# Patient Record
Sex: Female | Born: 1945 | Race: White | Hispanic: No | Smoking: Never smoker
Health system: Southern US, Academic
[De-identification: ages and names within clinical notes are randomized; demographics above are authoritative.]

## PROBLEM LIST (undated history)

## (undated) ENCOUNTER — Encounter (HOSPITAL_COMMUNITY)
Admission: EM | Disposition: A | Discharge status: Other Acute Inpatient Hospital | Payer: Self-pay | Source: Home / Self Care | Attending: SURGICAL CRITICAL CARE

## (undated) DIAGNOSIS — S129XXA Fracture of neck, unspecified, initial encounter: Secondary | ICD-10-CM

## (undated) DIAGNOSIS — I82409 Acute embolism and thrombosis of unspecified deep veins of unspecified lower extremity: Secondary | ICD-10-CM

## (undated) DIAGNOSIS — Z950 Presence of cardiac pacemaker: Secondary | ICD-10-CM

## (undated) DIAGNOSIS — M24541 Contracture, right hand: Secondary | ICD-10-CM

## (undated) DIAGNOSIS — G35 Multiple sclerosis: Secondary | ICD-10-CM

## (undated) DIAGNOSIS — O2230 Deep phlebothrombosis in pregnancy, unspecified trimester: Secondary | ICD-10-CM

## (undated) DIAGNOSIS — Z8711 Personal history of peptic ulcer disease: Secondary | ICD-10-CM

## (undated) DIAGNOSIS — Z8719 Personal history of other diseases of the digestive system: Secondary | ICD-10-CM

## (undated) DIAGNOSIS — J302 Other seasonal allergic rhinitis: Secondary | ICD-10-CM

## (undated) DIAGNOSIS — G8929 Other chronic pain: Secondary | ICD-10-CM

## (undated) DIAGNOSIS — F5101 Primary insomnia: Secondary | ICD-10-CM

## (undated) DIAGNOSIS — E039 Hypothyroidism, unspecified: Secondary | ICD-10-CM

## (undated) HISTORY — PX: SHOULDER SURGERY: SHX246

## (undated) HISTORY — DX: Other seasonal allergic rhinitis: J30.2

## (undated) HISTORY — PX: SMALL INTESTINE SURGERY: SHX150

## (undated) HISTORY — PX: ELBOW SURGERY: SHX618

## (undated) HISTORY — PX: NECK SURGERY: SHX720

## (undated) HISTORY — DX: Other chronic pain: G89.29

## (undated) HISTORY — PX: CATARACT EXTRACTION, BILATERAL: SHX1313

## (undated) HISTORY — PX: OTHER SURGICAL HISTORY: SHX169

## (undated) HISTORY — PX: TRACHEAL SURGERY: SHX1096

## (undated) HISTORY — DX: Hypothyroidism, unspecified: E03.9

## (undated) HISTORY — PX: BREAST SURGERY: SHX581

## (undated) HISTORY — PX: CHOLECYSTECTOMY: SHX55

## (undated) HISTORY — PX: ABDOMINAL SURGERY: SHX537

## (undated) HISTORY — DX: Primary insomnia: F51.01

## (undated) HISTORY — PX: TONSILLECTOMY AND ADENOIDECTOMY: SUR1326

## (undated) HISTORY — PX: ROUX-EN-Y GASTRIC BYPASS: SHX1104

## (undated) HISTORY — PX: APPENDECTOMY: SHX54

## (undated) HISTORY — PX: BACK SURGERY: SHX140

## (undated) HISTORY — DX: Multiple sclerosis: G35

## (undated) HISTORY — PX: ABDOMINAL HYSTERECTOMY: SHX81

## (undated) HISTORY — PX: GASTRIC BYPASS: SHX52

## (undated) HISTORY — PX: WRIST SURGERY: SHX841

## (undated) HISTORY — DX: Personal history of peptic ulcer disease: Z87.11

## (undated) HISTORY — DX: Contracture, right hand: M24.541

## (undated) HISTORY — DX: Fracture of neck, unspecified, initial encounter (CMS HCC): S12.9XXA

## (undated) HISTORY — PX: HX GALL BLADDER SURGERY/CHOLE: SHX55

## (undated) HISTORY — PX: SPINE SURGERY: SHX786

## (undated) HISTORY — PX: HX APPENDECTOMY: SHX54

## (undated) HISTORY — PX: HX GASTRIC BYPASS: SHX52

## (undated) SURGERY — LAMINECTOMY SPINE CERVICAL POSTERIOR PRONE 3 LEVELS OR MORE
Anesthesia: General | Site: Spine Cervical

---
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
## 2014-01-11 DIAGNOSIS — G35 Multiple sclerosis: Secondary | ICD-10-CM | POA: Diagnosis present

## 2014-01-11 DIAGNOSIS — K449 Diaphragmatic hernia without obstruction or gangrene: Secondary | ICD-10-CM | POA: Insufficient documentation

## 2014-01-11 DIAGNOSIS — I429 Cardiomyopathy, unspecified: Secondary | ICD-10-CM | POA: Insufficient documentation

## 2014-01-11 DIAGNOSIS — M1990 Unspecified osteoarthritis, unspecified site: Secondary | ICD-10-CM | POA: Insufficient documentation

## 2015-01-19 DIAGNOSIS — Z950 Presence of cardiac pacemaker: Secondary | ICD-10-CM

## 2015-01-19 HISTORY — DX: Presence of cardiac pacemaker: Z95.0

## 2017-11-22 HISTORY — PX: CERVICAL SPINE SURGERY: SHX589

## 2017-12-01 ENCOUNTER — Emergency Department (EMERGENCY_DEPARTMENT_HOSPITAL): Payer: Medicare HMO

## 2017-12-01 ENCOUNTER — Emergency Department (HOSPITAL_COMMUNITY): Payer: Medicare HMO

## 2017-12-01 ENCOUNTER — Inpatient Hospital Stay
Admission: EM | Admit: 2017-12-01 | Discharge: 2017-12-17 | DRG: 003 | Disposition: A | Discharge status: Other Acute Inpatient Hospital | Payer: Medicare HMO | Attending: SURGICAL CRITICAL CARE | Admitting: SURGICAL CRITICAL CARE

## 2017-12-01 ENCOUNTER — Inpatient Hospital Stay (HOSPITAL_COMMUNITY): Payer: 59 | Admitting: SURGICAL CRITICAL CARE

## 2017-12-01 ENCOUNTER — Encounter (HOSPITAL_COMMUNITY): Payer: Self-pay | Admitting: Student in an Organized Health Care Education/Training Program

## 2017-12-01 DIAGNOSIS — S14105A Unspecified injury at C5 level of cervical spinal cord, initial encounter: Secondary | ICD-10-CM | POA: Diagnosis present

## 2017-12-01 DIAGNOSIS — R448 Other symptoms and signs involving general sensations and perceptions: Secondary | ICD-10-CM

## 2017-12-01 DIAGNOSIS — S12400A Unspecified displaced fracture of fifth cervical vertebra, initial encounter for closed fracture: Secondary | ICD-10-CM | POA: Diagnosis present

## 2017-12-01 DIAGNOSIS — G8250 Quadriplegia, unspecified: Secondary | ICD-10-CM | POA: Diagnosis present

## 2017-12-01 DIAGNOSIS — J90 Pleural effusion, not elsewhere classified: Secondary | ICD-10-CM | POA: Diagnosis present

## 2017-12-01 DIAGNOSIS — Z86718 Personal history of other venous thrombosis and embolism: Secondary | ICD-10-CM

## 2017-12-01 DIAGNOSIS — S1980XA Other specified injuries of unspecified part of neck, initial encounter: Secondary | ICD-10-CM

## 2017-12-01 DIAGNOSIS — E119 Type 2 diabetes mellitus without complications: Secondary | ICD-10-CM | POA: Diagnosis present

## 2017-12-01 DIAGNOSIS — R64 Cachexia: Secondary | ICD-10-CM | POA: Diagnosis present

## 2017-12-01 DIAGNOSIS — F329 Major depressive disorder, single episode, unspecified: Secondary | ICD-10-CM | POA: Diagnosis present

## 2017-12-01 DIAGNOSIS — S0990XA Unspecified injury of head, initial encounter: Secondary | ICD-10-CM

## 2017-12-01 DIAGNOSIS — Z9049 Acquired absence of other specified parts of digestive tract: Secondary | ICD-10-CM

## 2017-12-01 DIAGNOSIS — I491 Atrial premature depolarization: Secondary | ICD-10-CM

## 2017-12-01 DIAGNOSIS — J189 Pneumonia, unspecified organism: Secondary | ICD-10-CM | POA: Diagnosis present

## 2017-12-01 DIAGNOSIS — Z4682 Encounter for fitting and adjustment of non-vascular catheter: Secondary | ICD-10-CM

## 2017-12-01 DIAGNOSIS — S12200A Unspecified displaced fracture of third cervical vertebra, initial encounter for closed fracture: Secondary | ICD-10-CM | POA: Diagnosis present

## 2017-12-01 DIAGNOSIS — S29001A Unspecified injury of muscle and tendon of front wall of thorax, initial encounter: Secondary | ICD-10-CM

## 2017-12-01 DIAGNOSIS — S3983XA Other specified injuries of pelvis, initial encounter: Secondary | ICD-10-CM

## 2017-12-01 DIAGNOSIS — Z45018 Encounter for adjustment and management of other part of cardiac pacemaker: Secondary | ICD-10-CM

## 2017-12-01 DIAGNOSIS — S14109A Unspecified injury at unspecified level of cervical spinal cord, initial encounter: Secondary | ICD-10-CM | POA: Diagnosis present

## 2017-12-01 DIAGNOSIS — Z950 Presence of cardiac pacemaker: Secondary | ICD-10-CM | POA: Diagnosis present

## 2017-12-01 DIAGNOSIS — G8253 Quadriplegia, C5-C7 complete: Secondary | ICD-10-CM | POA: Diagnosis present

## 2017-12-01 DIAGNOSIS — S32049A Unspecified fracture of fourth lumbar vertebra, initial encounter for closed fracture: Secondary | ICD-10-CM | POA: Diagnosis present

## 2017-12-01 DIAGNOSIS — S12130A Unspecified traumatic displaced spondylolisthesis of second cervical vertebra, initial encounter for closed fracture: Secondary | ICD-10-CM

## 2017-12-01 DIAGNOSIS — B961 Klebsiella pneumoniae [K. pneumoniae] as the cause of diseases classified elsewhere: Secondary | ICD-10-CM | POA: Diagnosis present

## 2017-12-01 DIAGNOSIS — N390 Urinary tract infection, site not specified: Secondary | ICD-10-CM | POA: Diagnosis present

## 2017-12-01 DIAGNOSIS — E46 Unspecified protein-calorie malnutrition: Secondary | ICD-10-CM | POA: Diagnosis present

## 2017-12-01 DIAGNOSIS — I2510 Atherosclerotic heart disease of native coronary artery without angina pectoris: Secondary | ICD-10-CM | POA: Diagnosis present

## 2017-12-01 DIAGNOSIS — T148XXA Other injury of unspecified body region, initial encounter: Secondary | ICD-10-CM

## 2017-12-01 DIAGNOSIS — K592 Neurogenic bowel, not elsewhere classified: Secondary | ICD-10-CM | POA: Diagnosis present

## 2017-12-01 DIAGNOSIS — S225XXA Flail chest, initial encounter for closed fracture: Secondary | ICD-10-CM | POA: Diagnosis present

## 2017-12-01 DIAGNOSIS — G35 Multiple sclerosis: Secondary | ICD-10-CM | POA: Diagnosis present

## 2017-12-01 DIAGNOSIS — S2243XA Multiple fractures of ribs, bilateral, initial encounter for closed fracture: Secondary | ICD-10-CM | POA: Diagnosis present

## 2017-12-01 DIAGNOSIS — Z9884 Bariatric surgery status: Secondary | ICD-10-CM | POA: Diagnosis present

## 2017-12-01 DIAGNOSIS — S22039A Unspecified fracture of third thoracic vertebra, initial encounter for closed fracture: Secondary | ICD-10-CM | POA: Diagnosis present

## 2017-12-01 DIAGNOSIS — J9690 Respiratory failure, unspecified, unspecified whether with hypoxia or hypercapnia: Secondary | ICD-10-CM

## 2017-12-01 DIAGNOSIS — Z9911 Dependence on respirator [ventilator] status: Secondary | ICD-10-CM

## 2017-12-01 DIAGNOSIS — S12230A Unspecified traumatic displaced spondylolisthesis of third cervical vertebra, initial encounter for closed fracture: Secondary | ICD-10-CM

## 2017-12-01 DIAGNOSIS — S22029A Unspecified fracture of second thoracic vertebra, initial encounter for closed fracture: Secondary | ICD-10-CM | POA: Diagnosis present

## 2017-12-01 DIAGNOSIS — R0602 Shortness of breath: Secondary | ICD-10-CM

## 2017-12-01 DIAGNOSIS — T794XXA Traumatic shock, initial encounter: Secondary | ICD-10-CM | POA: Diagnosis present

## 2017-12-01 DIAGNOSIS — S13161A Dislocation of C5/C6 cervical vertebrae, initial encounter: Secondary | ICD-10-CM | POA: Diagnosis present

## 2017-12-01 DIAGNOSIS — I4891 Unspecified atrial fibrillation: Secondary | ICD-10-CM | POA: Diagnosis present

## 2017-12-01 DIAGNOSIS — Y95 Nosocomial condition: Secondary | ICD-10-CM | POA: Diagnosis not present

## 2017-12-01 DIAGNOSIS — S27329A Contusion of lung, unspecified, initial encounter: Secondary | ICD-10-CM | POA: Diagnosis present

## 2017-12-01 DIAGNOSIS — I4589 Other specified conduction disorders: Secondary | ICD-10-CM

## 2017-12-01 DIAGNOSIS — M50222 Other cervical disc displacement at C5-C6 level: Secondary | ICD-10-CM | POA: Diagnosis present

## 2017-12-01 DIAGNOSIS — R9431 Abnormal electrocardiogram [ECG] [EKG]: Secondary | ICD-10-CM

## 2017-12-01 DIAGNOSIS — J9600 Acute respiratory failure, unspecified whether with hypoxia or hypercapnia: Secondary | ICD-10-CM | POA: Diagnosis present

## 2017-12-01 DIAGNOSIS — F438 Other reactions to severe stress: Secondary | ICD-10-CM | POA: Diagnosis present

## 2017-12-01 DIAGNOSIS — N319 Neuromuscular dysfunction of bladder, unspecified: Secondary | ICD-10-CM | POA: Diagnosis present

## 2017-12-01 DIAGNOSIS — S12100A Unspecified displaced fracture of second cervical vertebra, initial encounter for closed fracture: Secondary | ICD-10-CM | POA: Diagnosis present

## 2017-12-01 DIAGNOSIS — S129XXA Fracture of neck, unspecified, initial encounter: Secondary | ICD-10-CM

## 2017-12-01 DIAGNOSIS — S12500A Unspecified displaced fracture of sixth cervical vertebra, initial encounter for closed fracture: Secondary | ICD-10-CM | POA: Diagnosis present

## 2017-12-01 DIAGNOSIS — S39001A Unspecified injury of muscle, fascia and tendon of abdomen, initial encounter: Secondary | ICD-10-CM

## 2017-12-01 DIAGNOSIS — S22008A Other fracture of unspecified thoracic vertebra, initial encounter for closed fracture: Secondary | ICD-10-CM | POA: Diagnosis present

## 2017-12-01 DIAGNOSIS — S14104A Unspecified injury at C4 level of cervical spinal cord, initial encounter: Principal | ICD-10-CM | POA: Diagnosis present

## 2017-12-01 DIAGNOSIS — M549 Dorsalgia, unspecified: Secondary | ICD-10-CM

## 2017-12-01 DIAGNOSIS — S25101A Unspecified injury of right innominate or subclavian artery, initial encounter: Secondary | ICD-10-CM | POA: Diagnosis present

## 2017-12-01 DIAGNOSIS — I442 Atrioventricular block, complete: Secondary | ICD-10-CM | POA: Diagnosis present

## 2017-12-01 DIAGNOSIS — Z981 Arthrodesis status: Secondary | ICD-10-CM

## 2017-12-01 DIAGNOSIS — S14109S Unspecified injury at unspecified level of cervical spinal cord, sequela: Secondary | ICD-10-CM

## 2017-12-01 HISTORY — DX: Other cervical disc displacement at C5-C6 level: M50.222

## 2017-12-01 HISTORY — DX: Unspecified injury at unspecified level of cervical spinal cord, sequela: S14.109S

## 2017-12-01 HISTORY — DX: Quadriplegia, unspecified: G82.50

## 2017-12-01 HISTORY — DX: Multiple fractures of ribs, bilateral, initial encounter for closed fracture: S22.43XA

## 2017-12-01 HISTORY — DX: Acute embolism and thrombosis of unspecified deep veins of unspecified lower extremity (CMS HCC): I82.409

## 2017-12-01 HISTORY — DX: Deep phlebothrombosis in pregnancy, unspecified trimester: O22.30

## 2017-12-01 HISTORY — DX: Multiple sclerosis (CMS HCC): G35

## 2017-12-01 HISTORY — DX: Presence of cardiac pacemaker: Z95.0

## 2017-12-01 LAB — URINALYSIS, MACRO/MICRO
BILIRUBIN: NEGATIVE mg/dL
COLOR: NORMAL
GLUCOSE: NEGATIVE mg/dL
LEUKOCYTES: NEGATIVE WBCs/uL
NITRITE: NEGATIVE
PH: 7 (ref 5.0–8.0)
PROTEIN: NEGATIVE mg/dL
SPECIFIC GRAVITY: 1.005 (ref 1.005–1.030)

## 2017-12-01 LAB — DRUG SCREEN, NO CONFIRMATION, URINE
BARBITURATES URINE: NEGATIVE
BENZODIAZEPINES URINE: NEGATIVE
BUPRENORPHINE URINE: NEGATIVE
CANNABINOIDS URINE: NEGATIVE
COCAINE METABOLITES URINE: NEGATIVE
CREATININE RANDOM URINE: 15 mg/dL
ECSTASY/MDMA URINE: NEGATIVE
METHADONE URINE: NEGATIVE
OPIATES URINE (LOW CUTOFF): NEGATIVE
OXIDANT-ADULTERATION: NEGATIVE
PH-ADULTERATION: 7.3 (ref 4.5–9.0)
SPECIFIC GRAVITY-ADULTERATION: 1.013 g/mL (ref 1.005–1.030)

## 2017-12-01 LAB — CBC WITH DIFF
BASOPHIL #: <0.1 10*3/uL (ref <=0.20)
BASOPHIL %: 0 %
EOSINOPHIL #: 0.1 10*3/uL (ref <=0.50)
EOSINOPHIL %: 1 %
HCT: 38.2 % (ref 34.8–46.0)
HGB: 12.4 g/dL (ref 11.5–16.0)
IMMATURE GRANULOCYTE #: 0.13 10*3/uL — ABNORMAL HIGH (ref <0.10)
IMMATURE GRANULOCYTE %: 2 % — ABNORMAL HIGH (ref 0–1)
LYMPHOCYTE #: 1.36 10*3/uL (ref 1.00–4.80)
MCH: 28.8 pg (ref 26.0–32.0)
MCHC: 32.5 g/dL (ref 31.0–35.5)
MCV: 88.8 fL (ref 78.0–100.0)
MONOCYTE #: 0.43 10*3/uL (ref 0.20–1.10)
MONOCYTE %: 6 %
MPV: 10.4 fL (ref 8.7–12.5)
NEUTROPHIL #: 4.99 10*3/uL (ref 1.50–7.70)
NEUTROPHIL %: 72 %
PLATELETS: 152 10*3/uL (ref 150–400)
RBC: 4.3 10*6/uL (ref 3.85–5.22)
RDW-CV: 13.9 % (ref 11.5–15.5)
WBC: 7 10*3/uL (ref 3.7–11.0)

## 2017-12-01 LAB — ARTERIAL BLOOD GAS/LACTATE/CO-OX/LYTES (NA/K/CA/CL/GLUC) - ORS ONLY
BASE EXCESS (ARTERIAL): 2.7 mmol/L — ABNORMAL HIGH (ref 0.0–1.0)
BICARBONATE (ARTERIAL): 26.5 mmol/L — ABNORMAL HIGH (ref 18.0–26.0)
CARBOXYHEMOGLOBIN: 0.8 % (ref 0.0–2.5)
CHLORIDE: 104 mmol/L (ref 101–111)
GLUCOSE: 110 mg/dL — ABNORMAL HIGH (ref 60–105)
HEMOGLOBIN: 12.5 g/dL (ref 12.0–18.0)
IONIZED CALCIUM: 1.21 mmol/L (ref 1.10–1.35)
LACTATE: 1.1 mmol/L (ref 0.0–1.3)
MET-HEMOGLOBIN: 0 % (ref 0.0–2.0)
O2CT: 13 % — ABNORMAL LOW (ref 15.7–24.3)
OXYHEMOGLOBIN: 73.9 % — CL (ref 85.0–98.0)
PCO2 (ARTERIAL): 50 mmHg — ABNORMAL HIGH (ref 35.0–45.0)
PH (ARTERIAL): 7.37 (ref 7.35–7.45)
PO2 (ARTERIAL): 42 mmHg — CL (ref 72.0–100.0)
SODIUM: 136 mmol/L — ABNORMAL LOW (ref 137–145)
WHOLE BLOOD POTASSIUM: 4 mmol/L (ref 3.5–4.6)

## 2017-12-01 LAB — BASIC METABOLIC PANEL
ANION GAP: 7 mmol/L (ref 4–13)
BUN/CREA RATIO: 12 (ref 6–22)
BUN: 9 mg/dL (ref 8–25)
CALCIUM: 8.4 mg/dL — ABNORMAL LOW (ref 8.5–10.2)
CHLORIDE: 107 mmol/L (ref 96–111)
CO2 TOTAL: 25 mmol/L (ref 22–32)
CREATININE: 0.75 mg/dL (ref 0.49–1.10)
ESTIMATED GFR: >59 mL/min/1.73mˆ2 (ref >59)
GLUCOSE: 103 mg/dL (ref 65–139)
POTASSIUM: 4 mmol/L (ref 3.5–5.1)
SODIUM: 139 mmol/L (ref 136–145)

## 2017-12-01 LAB — PT/INR
INR: 1 (ref 0.80–1.20)
PROTHROMBIN TIME: 11.8 s (ref 9.5–14.1)

## 2017-12-01 LAB — BUN: BUN: 9 mg/dL (ref 8–25)

## 2017-12-01 LAB — CREATININE WITH EGFR
CREATININE: 0.74 mg/dL (ref 0.49–1.10)
ESTIMATED GFR: >59 mL/min/1.73mˆ2 (ref >59)

## 2017-12-01 LAB — ETHANOL, SERUM: ETHANOL: NOT DETECTED

## 2017-12-01 LAB — ARTERIAL BLOOD GAS/LACTATE/CO-OX/LYTES (NA/K/CA/CL/GLUC)
%FIO2 (ARTERIAL): 40 %
O2CT: 13 % — ABNORMAL LOW (ref 15.7–24.3)

## 2017-12-01 LAB — ETHANOL, SERUM/PLASMA: ETHANOL: <10 mg/dL (ref <10)

## 2017-12-01 LAB — POC ISTAT CREATININE (RESULT): CREATININE, POC: 0.8 mg/dL (ref 0.49–1.10)

## 2017-12-01 LAB — PTT (PARTIAL THROMBOPLASTIN TIME): APTT: 33.1 s (ref 24.1–38.5)

## 2017-12-01 MED ORDER — SODIUM CHLORIDE 0.9 % (FLUSH) INJECTION SYRINGE
2.00 mL | INJECTION | INTRAMUSCULAR | Status: DC | PRN
Start: 2017-12-01 — End: 2017-12-17

## 2017-12-01 MED ORDER — BISACODYL 5 MG TABLET,DELAYED RELEASE
5.0000 mg | DELAYED_RELEASE_TABLET | Freq: Two times a day (BID) | ORAL | Status: DC
Start: 2017-12-01 — End: 2017-12-01
  Administered 2017-12-01: 0 mg via ORAL
  Filled 2017-12-01 (×3): qty 1

## 2017-12-01 MED ORDER — KETAMINE 50 MG/ML INJECTION SOLUTION
Freq: Once | INTRAMUSCULAR | Status: AC | PRN
Start: 2017-12-01 — End: 2017-12-01
  Administered 2017-12-01: 75 mg via INTRAVENOUS

## 2017-12-01 MED ORDER — PSYLLIUM ORAL PACKET WRAPPER
1.0000 | PACK | Freq: Every day | Status: DC
Start: 2017-12-01 — End: 2017-12-02
  Administered 2017-12-01: 0 via ORAL
  Filled 2017-12-01 (×2): qty 1

## 2017-12-01 MED ORDER — CHLORHEXIDINE GLUCONATE 0.12 % MOUTHWASH
15.0000 mL | MOUTHWASH | Freq: Two times a day (BID) | Status: DC
Start: 2017-12-01 — End: 2017-12-17
  Administered 2017-12-01 – 2017-12-17 (×32): 15 mL via ORAL
  Filled 2017-12-01 (×33): qty 15

## 2017-12-01 MED ORDER — SODIUM CHLORIDE 0.9 % INTRAVENOUS SOLUTION
INTRAVENOUS | Status: DC
Start: 2017-12-01 — End: 2017-12-01

## 2017-12-01 MED ORDER — DOCUSATE SODIUM 50 MG/5 ML ORAL LIQUID
100.00 mg | Freq: Two times a day (BID) | ORAL | Status: DC
Start: 2017-12-01 — End: 2017-12-17
  Administered 2017-12-01: 0 mg via GASTROSTOMY
  Administered 2017-12-02: 100 mg via GASTROSTOMY
  Administered 2017-12-02: 0 mg via GASTROSTOMY
  Administered 2017-12-03 – 2017-12-09 (×12): 100 mg via GASTROSTOMY
  Administered 2017-12-09 – 2017-12-10 (×3): 0 mg via GASTROSTOMY
  Administered 2017-12-11 – 2017-12-17 (×13): 100 mg via GASTROSTOMY
  Filled 2017-12-01 (×37): qty 10

## 2017-12-01 MED ORDER — ALBUTEROL SULFATE 2.5 MG/3 ML (0.083 %) SOLUTION FOR NEBULIZATION
2.5000 mg | INHALATION_SOLUTION | RESPIRATORY_TRACT | Status: DC | PRN
Start: 2017-12-01 — End: 2017-12-07

## 2017-12-01 MED ORDER — MAGNESIUM HYDROXIDE 400 MG/5 ML ORAL SUSPENSION
15.0000 mL | Freq: Four times a day (QID) | ORAL | Status: DC | PRN
Start: 2017-12-01 — End: 2017-12-02

## 2017-12-01 MED ORDER — ELECTROLYTE-A INTRAVENOUS SOLUTION
INTRAVENOUS | Status: DC
Start: 2017-12-01 — End: 2017-12-02

## 2017-12-01 MED ORDER — FENTANYL (PF) 50 MCG/ML INTRAVENOUS SOLUTION
1.50 ug/kg/h | INTRAVENOUS | Status: DC
Start: 2017-12-01 — End: 2017-12-03
  Administered 2017-12-01: 0 ug/kg/h via INTRAVENOUS
  Administered 2017-12-01: 1.5 ug/kg/h via INTRAVENOUS
  Administered 2017-12-01: 0.2 ug/kg/h via INTRAVENOUS
  Administered 2017-12-01: 1.5 ug/kg/h via INTRAVENOUS
  Administered 2017-12-01: 0.6 ug/kg/h via INTRAVENOUS
  Administered 2017-12-02: 0 ug/kg/h via INTRAVENOUS
  Administered 2017-12-02: 0.4 ug/kg/h via INTRAVENOUS
  Administered 2017-12-02: 0.6 ug/kg/h via INTRAVENOUS
  Administered 2017-12-02 (×2): 0.8 ug/kg/h via INTRAVENOUS
  Administered 2017-12-02: 0.2 ug/kg/h via INTRAVENOUS
  Administered 2017-12-03: 0 ug/kg/h via INTRAVENOUS
  Filled 2017-12-01: qty 50

## 2017-12-01 MED ORDER — ELECTROLYTE-A INTRAVENOUS SOLUTION
INTRAVENOUS | Status: DC
Start: 2017-12-01 — End: 2017-12-01

## 2017-12-01 MED ORDER — IOVERSOL 320 MG IODINE/ML INTRAVENOUS SOLUTION
100.00 mL | INTRAVENOUS | Status: AC
Start: 2017-12-01 — End: 2017-12-01
  Administered 2017-12-01: 100 mL via INTRAVENOUS

## 2017-12-01 MED ORDER — NALOXONE 0.4 MG/ML INJECTION SOLUTION
INTRAMUSCULAR | Status: AC
Start: 2017-12-01 — End: 2017-12-01
  Administered 2017-12-01: 0.4 mg via INTRAVENOUS
  Filled 2017-12-01: qty 1

## 2017-12-01 MED ORDER — CLINDAMYCIN 900 MG/50 ML IN 5 % DEXTROSE INTRAVENOUS PIGGYBACK
900.0000 mg | INJECTION | Freq: Once | INTRAVENOUS | Status: DC
Start: 2017-12-01 — End: 2017-12-02

## 2017-12-01 MED ORDER — SODIUM CHLORIDE 0.9 % (FLUSH) INJECTION SYRINGE
2.00 mL | INJECTION | Freq: Three times a day (TID) | INTRAMUSCULAR | Status: DC
Start: 2017-12-01 — End: 2017-12-17
  Administered 2017-12-01: 0 mL
  Administered 2017-12-01: 2 mL
  Administered 2017-12-02 (×2): 0 mL
  Administered 2017-12-03: 2 mL
  Administered 2017-12-03: 0 mL
  Administered 2017-12-03: 2 mL
  Administered 2017-12-03: 0 mL
  Administered 2017-12-04: 2 mL
  Administered 2017-12-04: 0 mL
  Administered 2017-12-04 – 2017-12-07 (×8): 2 mL
  Administered 2017-12-07 (×2): 0 mL
  Administered 2017-12-08: 2 mL
  Administered 2017-12-08: 0 mL
  Administered 2017-12-08: 2 mL
  Administered 2017-12-09: 0 mL
  Administered 2017-12-09 – 2017-12-13 (×12): 2 mL
  Administered 2017-12-13 – 2017-12-14 (×4): 0 mL
  Administered 2017-12-14 – 2017-12-15 (×2): 2 mL
  Administered 2017-12-15: 0 mL
  Administered 2017-12-16 – 2017-12-17 (×4): 2 mL

## 2017-12-01 MED ORDER — ALBUTEROL SULFATE 2.5 MG/3 ML (0.083 %) SOLUTION FOR NEBULIZATION
2.5000 mg | INHALATION_SOLUTION | RESPIRATORY_TRACT | Status: DC
Start: 2017-12-01 — End: 2017-12-06
  Administered 2017-12-02 (×3): 2.5 mg via RESPIRATORY_TRACT
  Administered 2017-12-02: 0 mg via RESPIRATORY_TRACT
  Administered 2017-12-02 – 2017-12-03 (×4): 2.5 mg via RESPIRATORY_TRACT
  Administered 2017-12-03: 0 mg via RESPIRATORY_TRACT
  Administered 2017-12-03 – 2017-12-06 (×18): 2.5 mg via RESPIRATORY_TRACT
  Filled 2017-12-01 (×13): qty 3
  Filled 2017-12-01: qty 6
  Filled 2017-12-01 (×11): qty 3

## 2017-12-01 MED ORDER — NOREPINEPHRINE BITARTRATE 1 MG/ML INTRAVENOUS SOLUTION
INTRAVENOUS | Status: AC
Start: 2017-12-01 — End: 2017-12-02
  Filled 2017-12-01: qty 16

## 2017-12-01 MED ORDER — BISACODYL 10 MG RECTAL SUPPOSITORY
10.0000 mg | Freq: Every day | RECTAL | Status: DC
Start: 2017-12-01 — End: 2017-12-17
  Administered 2017-12-01: 0 mg via RECTAL
  Administered 2017-12-02 – 2017-12-09 (×8): 10 mg via RECTAL
  Administered 2017-12-10: 0 mg via RECTAL
  Administered 2017-12-11 – 2017-12-17 (×7): 10 mg via RECTAL
  Filled 2017-12-01 (×18): qty 1

## 2017-12-01 MED ORDER — GABAPENTIN 300 MG CAPSULE
300.0000 mg | ORAL_CAPSULE | Freq: Two times a day (BID) | ORAL | Status: DC
Start: 2017-12-02 — End: 2017-12-04
  Administered 2017-12-01: 23:00:00 0 mg via GASTROSTOMY
  Administered 2017-12-02: 300 mg via GASTROSTOMY
  Administered 2017-12-02: 0 mg via GASTROSTOMY
  Administered 2017-12-03 – 2017-12-04 (×3): 300 mg via GASTROSTOMY
  Filled 2017-12-01 (×5): qty 1

## 2017-12-01 MED ORDER — SENNA LEAF EXTRACT 176 MG/5 ML ORAL SYRUP
10.0000 mL | ORAL_SOLUTION | Freq: Two times a day (BID) | ORAL | Status: DC
Start: 2017-12-01 — End: 2017-12-17
  Administered 2017-12-01: 0 mg via GASTROSTOMY
  Administered 2017-12-02: 352 mg via GASTROSTOMY
  Administered 2017-12-02: 0 mg via GASTROSTOMY
  Administered 2017-12-03 – 2017-12-08 (×11): 352 mg via GASTROSTOMY
  Administered 2017-12-09 – 2017-12-10 (×4): 0 mg via GASTROSTOMY
  Administered 2017-12-11 – 2017-12-17 (×12): 352 mg via GASTROSTOMY
  Filled 2017-12-01 (×37): qty 15

## 2017-12-01 MED ORDER — ELECTROLYTE-A INTRAVENOUS SOLUTION BOLUS
1000.0000 mL | Freq: Once | INTRAVENOUS | Status: AC
Start: 2017-12-01 — End: 2017-12-01
  Administered 2017-12-01: 0 mL via INTRAVENOUS
  Administered 2017-12-01: 1000 mL via INTRAVENOUS

## 2017-12-01 MED ORDER — SODIUM CHLORIDE 0.9 % IV BOLUS
INJECTION | Status: AC | PRN
Start: 2017-12-01 — End: 2017-12-01

## 2017-12-01 MED ORDER — NALOXONE 0.4 MG/ML INJECTION SOLUTION
0.4000 mg | INTRAMUSCULAR | Status: AC
Start: 2017-12-01 — End: 2017-12-01

## 2017-12-01 MED ORDER — FENTANYL (PF) 50 MCG/ML INJECTION SOLUTION
50.00 ug | INTRAMUSCULAR | Status: DC | PRN
Start: 2017-12-01 — End: 2017-12-03
  Administered 2017-12-01 – 2017-12-02 (×7): 50 ug via INTRAVENOUS
  Filled 2017-12-01 (×8): qty 2

## 2017-12-01 MED ORDER — PHENOL 1.4 % MUCOSAL AEROSOL SPRAY
2.00 | INHALATION_SPRAY | Status: DC | PRN
Start: 2017-12-01 — End: 2017-12-03
  Administered 2017-12-02: 02:00:00 2 via OROMUCOSAL
  Filled 2017-12-01: qty 177

## 2017-12-01 MED ORDER — ROCURONIUM 10 MG/ML INTRAVENOUS SOLUTION
Freq: Once | INTRAVENOUS | Status: AC | PRN
Start: 2017-12-01 — End: 2017-12-01
  Administered 2017-12-01: 75 mg via INTRAVENOUS

## 2017-12-01 MED ORDER — FAMOTIDINE 20 MG TABLET
20.0000 mg | ORAL_TABLET | Freq: Two times a day (BID) | ORAL | Status: DC
Start: 2017-12-01 — End: 2017-12-02
  Administered 2017-12-01 – 2017-12-02 (×2): 0 mg via ORAL

## 2017-12-01 MED ORDER — NOREPINEPHRINE BITARTRATE 16 MG/250 ML (64 MCG/ML) IN 0.9 % NACL IV
0.0300 ug/kg/min | INTRAVENOUS | Status: DC
Start: 2017-12-01 — End: 2017-12-11
  Administered 2017-12-01: 0.08 ug/kg/min via INTRAVENOUS
  Administered 2017-12-01: 0.07 ug/kg/min via INTRAVENOUS
  Administered 2017-12-01: 0.08 ug/kg/min via INTRAVENOUS
  Administered 2017-12-01: 0.03 ug/kg/min via INTRAVENOUS
  Administered 2017-12-01 (×2): 0.1 ug/kg/min via INTRAVENOUS
  Administered 2017-12-01: 0.05 ug/kg/min via INTRAVENOUS
  Administered 2017-12-01: 0.07 ug/kg/min via INTRAVENOUS
  Administered 2017-12-01: 0.09 ug/kg/min via INTRAVENOUS
  Administered 2017-12-02: 0.11 ug/kg/min via INTRAVENOUS
  Administered 2017-12-02: 0.05 ug/kg/min via INTRAVENOUS
  Administered 2017-12-02: 0.1 ug/kg/min via INTRAVENOUS
  Administered 2017-12-02: 0.2 ug/kg/min via INTRAVENOUS
  Administered 2017-12-02: 0.18 ug/kg/min via INTRAVENOUS
  Administered 2017-12-02: 0.09 ug/kg/min via INTRAVENOUS
  Administered 2017-12-02: 0.16 ug/kg/min via INTRAVENOUS
  Administered 2017-12-02: 0.12 ug/kg/min via INTRAVENOUS
  Administered 2017-12-02: 0.09 ug/kg/min via INTRAVENOUS
  Administered 2017-12-02: 0.14 ug/kg/min via INTRAVENOUS
  Administered 2017-12-03 (×2): 0.13 ug/kg/min via INTRAVENOUS
  Administered 2017-12-03: 0.09 ug/kg/min via INTRAVENOUS
  Administered 2017-12-03: 0.13 ug/kg/min via INTRAVENOUS
  Administered 2017-12-03: 0.07 ug/kg/min via INTRAVENOUS
  Administered 2017-12-03 (×3): 0.11 ug/kg/min via INTRAVENOUS
  Administered 2017-12-03: 0.07 ug/kg/min via INTRAVENOUS
  Administered 2017-12-03 (×3): 0.09 ug/kg/min via INTRAVENOUS
  Administered 2017-12-03 – 2017-12-04 (×2): 0.07 ug/kg/min via INTRAVENOUS
  Administered 2017-12-04: 0.11 ug/kg/min via INTRAVENOUS
  Administered 2017-12-04: 0.09 ug/kg/min via INTRAVENOUS
  Administered 2017-12-04: 0.07 ug/kg/min via INTRAVENOUS
  Administered 2017-12-04: 0.1 ug/kg/min via INTRAVENOUS
  Administered 2017-12-04: 0.09 ug/kg/min via INTRAVENOUS
  Administered 2017-12-04: 0.07 ug/kg/min via INTRAVENOUS
  Administered 2017-12-04: 0.09 ug/kg/min via INTRAVENOUS
  Administered 2017-12-04 (×2): 0.05 ug/kg/min via INTRAVENOUS
  Administered 2017-12-04: 0.11 ug/kg/min via INTRAVENOUS
  Administered 2017-12-04: 0.09 ug/kg/min via INTRAVENOUS
  Administered 2017-12-04: 0.1 ug/kg/min via INTRAVENOUS
  Administered 2017-12-05: 10:00:00 0.05 ug/kg/min via INTRAVENOUS
  Administered 2017-12-05: 0.01 ug/kg/min via INTRAVENOUS
  Administered 2017-12-05: 0.08 ug/kg/min via INTRAVENOUS
  Administered 2017-12-05: 0.14 ug/kg/min via INTRAVENOUS
  Administered 2017-12-05: .1 ug/kg/min via INTRAVENOUS
  Administered 2017-12-05: .08 ug/kg/min via INTRAVENOUS
  Administered 2017-12-05: 0.16 ug/kg/min via INTRAVENOUS
  Administered 2017-12-05: 0.08 ug/kg/min via INTRAVENOUS
  Administered 2017-12-05: .08 ug/kg/min via INTRAVENOUS
  Administered 2017-12-05: 0.12 ug/kg/min via INTRAVENOUS
  Administered 2017-12-05: 0.16 ug/kg/min via INTRAVENOUS
  Administered 2017-12-05: 0.14 ug/kg/min via INTRAVENOUS
  Administered 2017-12-05: 0.1 ug/kg/min via INTRAVENOUS
  Administered 2017-12-06: 0.19 ug/kg/min via INTRAVENOUS
  Administered 2017-12-06: 0.1 ug/kg/min via INTRAVENOUS
  Administered 2017-12-06: 0.14 ug/kg/min via INTRAVENOUS
  Administered 2017-12-06: 0.12 ug/kg/min via INTRAVENOUS
  Administered 2017-12-06: 0.17 ug/kg/min via INTRAVENOUS
  Administered 2017-12-06: 0.18 ug/kg/min via INTRAVENOUS
  Administered 2017-12-06 (×2): 0.16 ug/kg/min via INTRAVENOUS
  Administered 2017-12-07: 0.07 ug/kg/min via INTRAVENOUS
  Administered 2017-12-07: 0.08 ug/kg/min via INTRAVENOUS
  Administered 2017-12-07: 0.1 ug/kg/min via INTRAVENOUS
  Administered 2017-12-07: 0.07 ug/kg/min via INTRAVENOUS
  Administered 2017-12-07 (×2): 0.09 ug/kg/min via INTRAVENOUS
  Administered 2017-12-07 (×2): 0.1 ug/kg/min via INTRAVENOUS
  Administered 2017-12-08: 0.07 ug/kg/min via INTRAVENOUS
  Administered 2017-12-08: 0.04 ug/kg/min via INTRAVENOUS
  Administered 2017-12-08: 0.06 ug/kg/min via INTRAVENOUS
  Administered 2017-12-08: 0.05 ug/kg/min via INTRAVENOUS
  Administered 2017-12-08: 0.07 ug/kg/min via INTRAVENOUS
  Administered 2017-12-08: 0.03 ug/kg/min via INTRAVENOUS
  Administered 2017-12-08: 0.04 ug/kg/min via INTRAVENOUS
  Administered 2017-12-08: 0.05 ug/kg/min via INTRAVENOUS
  Administered 2017-12-08: 0.04 ug/kg/min via INTRAVENOUS
  Administered 2017-12-09: 0.01 ug/kg/min via INTRAVENOUS
  Administered 2017-12-09: 0.03 ug/kg/min via INTRAVENOUS
  Administered 2017-12-09 (×2): 0.02 ug/kg/min via INTRAVENOUS
  Administered 2017-12-09: 0.01 ug/kg/min via INTRAVENOUS
  Administered 2017-12-09 (×2): 0.02 ug/kg/min via INTRAVENOUS
  Administered 2017-12-09: 0.03 ug/kg/min via INTRAVENOUS
  Administered 2017-12-09: 0.04 ug/kg/min via INTRAVENOUS
  Administered 2017-12-09: 0.02 ug/kg/min via INTRAVENOUS
  Administered 2017-12-09: 0.01 ug/kg/min via INTRAVENOUS
  Administered 2017-12-09: 0 ug/kg/min via INTRAVENOUS
  Administered 2017-12-09: 0.01 ug/kg/min via INTRAVENOUS
  Filled 2017-12-01 (×6): qty 250

## 2017-12-01 MED ADMIN — naloxone 0.4 mg/mL injection solution: INTRAVENOUS | @ 17:00:00
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
## 2017-12-01 NOTE — Nurses Notes (Signed)
Pt taken to SICU 11 with belongings. Security notified and checked belongings with SICU RN.
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
## 2017-12-01 NOTE — Procedures (Signed)
[facility] Line Placement    Procedure Date:  12/01/2017 Time:  18:17   Procedure: Central Line Placement  Indication: venous access    Description:  Because of an urgent/emergent situation, informed consent was not obtained.  A surgical time out was performed to confirm correct patient and procedure.  The site was identified and prepped in the usual sterile fashion.  The skin was prepped with chlorhexidine. Anesthesia was not necessary due to an insensate patient.  A triple  lumen central line was inserted in the right femoral vein with patient in trendelenburg position using the Seldinger Technique.  The vein was cannulated and the guidewire was placed.  The needle was removed and the dilator advanced.  The catheter was placed over the guidewire and the guidewire was then removed.  Ultrasound guidance was required.  All ports drew blood back and flushed easily.  The line was sutured in place and dressed in sterile fashion. CXR ordered to verify placement.  Patient tolerated procedure without complications.     Procedure was performed by Dr. Eliott Nine.    Dr. Olam Idler was present, supervised and participated in the procedure.      Eliott Nine, MD  Surgical Intensive Care Unit  [phone]       I was present and supervised/observed the entire procedure.  Olam Idler, MD

## 2017-12-01 NOTE — Care Plan (Signed)
Pt intubated and on mechanical ventilation. Ventilator setting are    VENTILATOR - SIMV(PRVC)PS / APV-SIMV CONTINUOUS Discontinue   Duration: Until Specified    Priority: Routine       Question Answer Comment   FIO2 (%) 50    Peep(cm/H2O) 8    Rate(bpm) 12    Tidal Volume(mls) 450    Pressure Support(cm/H2O) 10            Cough assist ordered Q4.  Will continue to monitor respiratory status.
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
## 2017-12-01 NOTE — Procedures (Signed)
Device Interrogation  [employer] [phone] dual chamber pacemaker for CHB, sinus arrest. No abandoned hardware. Non-conditional.  Pacer dependent.   Battery status etr=8.9 years.  Mode=DDD@50  bpm.  A: 390 ohms, P=3.8 mV, threshold=0.5 V@0 .4 ms  RV: 760 ohms, R=6.0 mV, threshold=1.0 V @0 .5 ms      Ap=30% , Vp=>99%.   AT/AF burden= <1%.  No changes made today.     Milus Mallick, PA-C      PACEMAKER OR DEFIBRILLATOR EVALUATION (INTERROGATION EVALUATION OR PROGRAMMING EVALUATION), OR ANALYSIS OF REMOTE MONITORING, AS APPLICABLE    Order:    See patient's medical record.    Procedure Description:    Standard programming evaluation (or interrogation evaluation) using device programmer, or standard analysis of remote monitoring, as appropriate.    Data recording:    See printouts, where available, for details.    Physician Analysis and Reporting:    Unremarkable device analysis. No major abnormalities, except as otherwise noted, if applicable.      Plan:    Plan routine follow-up, as appropriate, or unless otherwise indicated.  Please see notes and/or scanned printouts (where available) for details.      Jacquel Redditt B. Noreene Filbert, M.D.  Pager: [phone]  Office: [phone]
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
## 2017-12-01 NOTE — H&P (Signed)
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 clinically significant, or life-threatening deterioration and required a high degree of Attending Physician attention and direct involvement to intervene urgently. Data review and care planning was performed in direct proximity of the patient, examination was obviously performed in direct contact with the patient. All of this time was exclusive of procedure which will be documented elsewhere in the chart.    My critical care time is independent and unique to other providers (no other providers saw patient for purposes of critical care evaluation)  My critical care time involved full attention to the patients' condition and included:    Review of nursing notes and/or old charts  Review of medications, allergies, and vital signs  Documentation time  Consultant collaboration on findings and treatment options  Care, transfer of care, and discharge plans  Ordering, interpreting, and reviewing diagnostic studies/tab tests  Obtaining necessary history from family, EMS, nursing home staff and/or treating physicians    My critical care time did not include time spent teaching resident physician(s) or other services of resident physicians, or performing other reported procedures.  Total Critical Care Time: 50 minutes    Antoine Primas, MD  Assistant Professor of Surgery  Trauma, Surgical  Critical Care, and [place]  12/02/2017 15:29
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
## 2017-12-01 NOTE — Procedures (Signed)
Arterial Line Placement    Procedure Date:  12/01/2017 Time:  1730  Procedure: Arterial Line Placement  Diagnosis:  Spinal cord injury  Indication: invasive BP monitoring    Description: Because of an urgent/emergent situation, informed consent was not obtained. The skin was prepped with chlorhexidine.  A 4 french catheter was inserted in the right Radial using the Seldinger Technigue. Good arterial blood was noted and then the line was connected to the transducer showing arterial wave forms.  The line was sutured in place and dressed. Patient tolerated procedure without complications.     Christy Ferrari, DO 12/01/2017, 19:07       I was present and supervised/observed the entire procedure.  Olam Idler, MD
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
## 2017-12-02 ENCOUNTER — Inpatient Hospital Stay (HOSPITAL_COMMUNITY): Payer: Medicare HMO | Admitting: Student in an Organized Health Care Education/Training Program

## 2017-12-02 ENCOUNTER — Inpatient Hospital Stay (HOSPITAL_COMMUNITY)
Admission: RE | Admit: 2017-12-02 | Discharge: 2017-12-02 | Disposition: A | Discharge status: Home / Self Care | Payer: Medicare HMO | Source: Ambulatory Visit | Admitting: Radiology

## 2017-12-02 ENCOUNTER — Encounter

## 2017-12-02 ENCOUNTER — Inpatient Hospital Stay (HOSPITAL_COMMUNITY): Payer: Medicare HMO

## 2017-12-02 ENCOUNTER — Inpatient Hospital Stay (HOSPITAL_COMMUNITY): Payer: Medicare HMO | Admitting: Radiology

## 2017-12-02 ENCOUNTER — Encounter (HOSPITAL_COMMUNITY)
Admission: EM | Disposition: A | Discharge status: Other Acute Inpatient Hospital | Payer: Self-pay | Source: Home / Self Care | Attending: SURGICAL CRITICAL CARE

## 2017-12-02 DIAGNOSIS — S12100A Unspecified displaced fracture of second cervical vertebra, initial encounter for closed fracture: Secondary | ICD-10-CM

## 2017-12-02 DIAGNOSIS — I2510 Atherosclerotic heart disease of native coronary artery without angina pectoris: Secondary | ICD-10-CM

## 2017-12-02 DIAGNOSIS — Z4682 Encounter for fitting and adjustment of non-vascular catheter: Secondary | ICD-10-CM

## 2017-12-02 DIAGNOSIS — S12200A Unspecified displaced fracture of third cervical vertebra, initial encounter for closed fracture: Secondary | ICD-10-CM

## 2017-12-02 DIAGNOSIS — I440 Atrioventricular block, first degree: Secondary | ICD-10-CM

## 2017-12-02 DIAGNOSIS — S12500A Unspecified displaced fracture of sixth cervical vertebra, initial encounter for closed fracture: Secondary | ICD-10-CM

## 2017-12-02 DIAGNOSIS — S12400A Unspecified displaced fracture of fifth cervical vertebra, initial encounter for closed fracture: Secondary | ICD-10-CM

## 2017-12-02 DIAGNOSIS — T148XXD Other injury of unspecified body region, subsequent encounter: Secondary | ICD-10-CM

## 2017-12-02 DIAGNOSIS — S2243XA Multiple fractures of ribs, bilateral, initial encounter for closed fracture: Secondary | ICD-10-CM

## 2017-12-02 LAB — ARTERIAL BLOOD GAS/LACTATE/CO-OX/LYTES (NA/K/CA/CL/GLUC) (TEMP COMP) - ORS ONLY
(T) PCO2: 32 mmHg — ABNORMAL LOW (ref 35.0–45.0)
(T) PCO2: 32 mmHg — ABNORMAL LOW (ref 35.0–45.0)
(T) PO2: 230 mmHg — ABNORMAL HIGH (ref 72.0–100.0)
(T) PO2: 197 mmHg — ABNORMAL HIGH (ref 72.0–100.0)
BASE DEFICIT: 4.5 mmol/L — ABNORMAL HIGH (ref 0.0–3.0)
BASE DEFICIT: 6.2 mmol/L — ABNORMAL HIGH (ref 0.0–3.0)
BICARBONATE (ARTERIAL): 21.4 mmol/L (ref 18.0–26.0)
BICARBONATE (ARTERIAL): 20.1 mmol/L (ref 18.0–26.0)
CARBOXYHEMOGLOBIN: 1.1 % (ref 0.0–2.5)
CARBOXYHEMOGLOBIN: 1.3 % (ref 0.0–2.5)
CHLORIDE: 111 mmol/L (ref 101–111)
CHLORIDE: 114 mmol/L — ABNORMAL HIGH (ref 101–111)
GLUCOSE: 173 mg/dL — ABNORMAL HIGH (ref 60–105)
GLUCOSE: 143 mg/dL — ABNORMAL HIGH (ref 60–105)
HEMOGLOBIN: 8.1 g/dL — ABNORMAL LOW (ref 12.0–18.0)
HEMOGLOBIN: 7.1 g/dL — ABNORMAL LOW (ref 12.0–18.0)
IONIZED CALCIUM: 0.99 mmol/L — ABNORMAL LOW (ref 1.10–1.35)
IONIZED CALCIUM: 0.94 mmol/L — ABNORMAL LOW (ref 1.10–1.35)
LACTATE: 2.3 mmol/L — ABNORMAL HIGH (ref 0.0–1.3)
LACTATE: 2.1 mmol/L — ABNORMAL HIGH (ref 0.0–1.3)
MET-HEMOGLOBIN: 1.1 % (ref 0.0–2.0)
MET-HEMOGLOBIN: 0.9 % (ref 0.0–2.0)
O2CT: 11.7 % — ABNORMAL LOW (ref 15.7–24.3)
O2CT: 10.3 % — ABNORMAL LOW (ref 15.7–24.3)
OXYHEMOGLOBIN: 97.1 % (ref 85.0–98.0)
OXYHEMOGLOBIN: 97.7 % (ref 85.0–98.0)
PCO2 (ARTERIAL): 31 mmHg — ABNORMAL LOW (ref 35.0–45.0)
PCO2 (ARTERIAL): 31 mmHg — ABNORMAL LOW (ref 35.0–45.0)
PH (ARTERIAL): 7.4 (ref 7.35–7.45)
PH (ARTERIAL): 7.37 (ref 7.35–7.45)
PH (T): 7.39 (ref 7.35–7.45)
PH (T): 7.36 (ref 7.35–7.45)
PO2 (ARTERIAL): 227 mmHg — ABNORMAL HIGH (ref 72.0–100.0)
PO2 (ARTERIAL): 194 mmHg — ABNORMAL HIGH (ref 72.0–100.0)
SODIUM: 138 mmol/L (ref 137–145)
SODIUM: 140 mmol/L (ref 137–145)
TEMPERATURE, COMP: 37.6 C (ref 15.0–40.0)
TEMPERATURE, COMP: 37.6 C (ref 15.0–40.0)
WHOLE BLOOD POTASSIUM: 2.8 mmol/L — ABNORMAL LOW (ref 3.5–4.6)
WHOLE BLOOD POTASSIUM: 2.7 mmol/L — ABNORMAL LOW (ref 3.5–4.6)

## 2017-12-02 LAB — CBC
HCT: 28.9 % — ABNORMAL LOW (ref 34.8–46.0)
HCT: 36.8 % (ref 34.8–46.0)
HGB: 11.7 g/dL (ref 11.5–16.0)
HGB: 9.6 g/dL — ABNORMAL LOW (ref 11.5–16.0)
MCH: 28.7 pg (ref 26.0–32.0)
MCH: 28.8 pg (ref 26.0–32.0)
MCHC: 31.8 g/dL (ref 31.0–35.5)
MCHC: 33.2 g/dL (ref 31.0–35.5)
MCV: 86.8 fL (ref 78.0–100.0)
MCV: 90.4 fL (ref 78.0–100.0)
MPV: 10.7 fL (ref 8.7–12.5)
PLATELETS: 200 x10ˆ3/uL (ref 150–400)
PLATELETS: 98 x10ˆ3/uL — ABNORMAL LOW (ref 150–400)
RBC: 3.33 x10ˆ6/uL — ABNORMAL LOW (ref 3.85–5.22)
RBC: 4.07 x10ˆ6/uL (ref 3.85–5.22)
RDW-CV: 13.8 % (ref 11.5–15.5)
RDW-CV: 15 % (ref 11.5–15.5)
WBC: 18 x10ˆ3/uL — ABNORMAL HIGH (ref 3.7–11.0)
WBC: 8.8 x10ˆ3/uL (ref 3.7–11.0)

## 2017-12-02 LAB — BASIC METABOLIC PANEL
ANION GAP: 12 mmol/L (ref 4–13)
ANION GAP: 8 mmol/L (ref 4–13)
BUN/CREA RATIO: 13 (ref 6–22)
BUN/CREA RATIO: 13 (ref 6–22)
BUN: 10 mg/dL (ref 8–25)
BUN: 10 mg/dL (ref 8–25)
CALCIUM: 8.6 mg/dL (ref 8.5–10.2)
CALCIUM: 8.3 mg/dL — ABNORMAL LOW (ref 8.5–10.2)
CHLORIDE: 106 mmol/L (ref 96–111)
CHLORIDE: 110 mmol/L (ref 96–111)
CO2 TOTAL: 21 mmol/L — ABNORMAL LOW (ref 22–32)
CO2 TOTAL: 23 mmol/L (ref 22–32)
CREATININE: 0.76 mg/dL (ref 0.49–1.10)
CREATININE: 0.75 mg/dL (ref 0.49–1.10)
ESTIMATED GFR: >59 mL/min/1.73mˆ2 (ref >59)
ESTIMATED GFR: >59 mL/min/1.73mˆ2 (ref >59)
GLUCOSE: 193 mg/dL — ABNORMAL HIGH (ref 65–139)
GLUCOSE: 166 mg/dL — ABNORMAL HIGH (ref 65–139)
POTASSIUM: 3.9 mmol/L (ref 3.5–5.1)
POTASSIUM: 3.7 mmol/L (ref 3.5–5.1)
SODIUM: 139 mmol/L (ref 136–145)
SODIUM: 141 mmol/L (ref 136–145)

## 2017-12-02 LAB — ECG 12-LEAD
Atrial Rate: 67 {beats}/min
Calculated P Axis: -11 degrees
Calculated R Axis: 77 degrees
Calculated T Axis: 40 degrees
PR Interval: 214 ms
QRS Duration: 150 ms
QT Interval: 496 ms
QTC Calculation: 524 ms
Ventricular rate: 67 {beats}/min

## 2017-12-02 LAB — POC BLOOD GLUCOSE (RESULTS)
GLUCOSE, POC: 179 mg/dL — ABNORMAL HIGH (ref 70–105)
GLUCOSE, POC: 161 mg/dL — ABNORMAL HIGH (ref 70–105)
GLUCOSE, POC: 171 mg/dL — ABNORMAL HIGH (ref 70–105)

## 2017-12-02 LAB — TEG (THROMBOELASTOGRAPH) - MUST BE IN SEPARATE TUBE

## 2017-12-02 LAB — MAGNESIUM
MAGNESIUM: 2.2 mg/dL (ref 1.6–2.6)
MAGNESIUM: 2.3 mg/dL (ref 1.6–2.6)

## 2017-12-02 LAB — PHOSPHORUS
PHOSPHORUS: 4 mg/dL (ref 2.3–4.0)
PHOSPHORUS: 4.1 mg/dL — ABNORMAL HIGH (ref 2.3–4.0)

## 2017-12-02 LAB — LACTIC ACID LEVEL: LACTIC ACID: 2.8 mmol/L — ABNORMAL HIGH (ref 0.5–2.2)

## 2017-12-02 LAB — ARTERIAL BLOOD GAS/LACTATE/CO-OX/LYTES (NA/K/CA/CL/GLUC) (TEMP COMP)
%FIO2 (ARTERIAL): 60 %
%FIO2 (ARTERIAL): 48 %
PAO2/FIO2 RATIO: 378 (ref <=200)

## 2017-12-02 LAB — TROPONIN-I: TROPONIN I: <7 ng/L (ref 0–30)

## 2017-12-02 SURGERY — LAMINECTOMY SPINE CERVICAL POSTERIOR PRONE 3 LEVELS OR MORE
Anesthesia: General | Site: Spine Cervical | Wound class: Clean Wound: Uninfected operative wounds in which no inflammation occurred

## 2017-12-02 MED ORDER — NOREPINEPHRINE 16MG IN NS 250ML INFUSION - FOR ANES
INTRAVENOUS | Status: DC | PRN
Start: 2017-12-02 — End: 2017-12-02
  Administered 2017-12-02 (×2): .01 ug/kg/min via INTRAVENOUS
  Administered 2017-12-02: .05 ug/kg/min via INTRAVENOUS
  Administered 2017-12-02: .03 ug/kg/min via INTRAVENOUS
  Administered 2017-12-02: .02 ug/kg/min via INTRAVENOUS
  Administered 2017-12-02: .05 ug/kg/min via INTRAVENOUS
  Administered 2017-12-02: .02 ug/kg/min via INTRAVENOUS
  Administered 2017-12-02: .05 ug/kg/min via INTRAVENOUS
  Administered 2017-12-02: .04 ug/kg/min via INTRAVENOUS
  Administered 2017-12-02: .2 ug/kg/min via INTRAVENOUS
  Administered 2017-12-02: .03 ug/kg/min via INTRAVENOUS
  Administered 2017-12-02: .1 ug/kg/min via INTRAVENOUS
  Administered 2017-12-02: .02 ug/kg/min via INTRAVENOUS
  Administered 2017-12-02: .03 ug/kg/min via INTRAVENOUS

## 2017-12-02 MED ORDER — THROMBIN (RECOMBINANT) 5,000 UNIT TOPICAL SOLUTION
Freq: Once | INTRAMUSCULAR | Status: DC | PRN
Start: 2017-12-02 — End: 2017-12-02

## 2017-12-02 MED ORDER — INSULIN REGULAR HUMAN 100 UNIT/ML INJECTION SSIP
2.00 [IU] | INJECTION | Freq: Four times a day (QID) | SUBCUTANEOUS | Status: DC | PRN
Start: 2017-12-02 — End: 2017-12-06
  Administered 2017-12-02 – 2017-12-05 (×5): 2 [IU] via SUBCUTANEOUS
  Administered 2017-12-06: 6 [IU] via SUBCUTANEOUS
  Administered 2017-12-06: 4 [IU] via SUBCUTANEOUS
  Filled 2017-12-02: qty 3

## 2017-12-02 MED ORDER — PERFLUTREN LIPID MICROSPHERES 1.1 MG/ML INTRAVENOUS SUSPENSION
2.00 mL | INTRAVENOUS | Status: DC
Start: 2017-12-02 — End: 2017-12-10

## 2017-12-02 MED ORDER — BUPIVACAINE-EPINEPHRINE (PF) 0.25 %-1:200,000 INJECTION SOLUTION
30.0000 mL | Freq: Once | INTRAMUSCULAR | Status: DC | PRN
Start: 2017-12-02 — End: 2017-12-02
  Administered 2017-12-02: 30 mL via INTRAMUSCULAR
  Administered 2017-12-02: 25 mL via INTRAMUSCULAR

## 2017-12-02 MED ORDER — PHENYLEPHRINE 10 MG IN NS 100 ML INFUSION - FOR ANES
INTRAVENOUS | Status: DC | PRN
Start: 2017-12-02 — End: 2017-12-02
  Administered 2017-12-02: .7 ug/kg/min via INTRAVENOUS
  Administered 2017-12-02: .6 ug/kg/min via INTRAVENOUS
  Administered 2017-12-02: 0 ug/kg/min via INTRAVENOUS
  Administered 2017-12-02: .5 ug/kg/min via INTRAVENOUS
  Administered 2017-12-02: .2 ug/kg/min via INTRAVENOUS
  Administered 2017-12-02: .5 ug/kg/min via INTRAVENOUS
  Administered 2017-12-02: 0.6 ug/kg/min via INTRAVENOUS
  Administered 2017-12-02 (×2): .4 ug/kg/min via INTRAVENOUS
  Administered 2017-12-02: .6 ug/kg/min via INTRAVENOUS
  Administered 2017-12-02: .7 ug/kg/min via INTRAVENOUS
  Administered 2017-12-02: .5 ug/kg/min via INTRAVENOUS

## 2017-12-02 MED ORDER — FENTANYL (PF) 50 MCG/ML INJECTION SOLUTION
Freq: Once | INTRAMUSCULAR | Status: DC | PRN
Start: 2017-12-02 — End: 2017-12-02
  Administered 2017-12-02 (×2): 50 ug via INTRAVENOUS

## 2017-12-02 MED ORDER — SODIUM CHLORIDE 0.9 % INTRAVENOUS SOLUTION
INTRAVENOUS | Status: AC
Start: 2017-12-02 — End: 2017-12-02
  Filled 2017-12-02: qty 1000

## 2017-12-02 MED ORDER — VANCOMYCIN IV - PHARMACIST TO DOSE PER PROTOCOL
Freq: Every day | Status: DC | PRN
Start: 2017-12-02 — End: 2017-12-02

## 2017-12-02 MED ORDER — ALBUMIN, HUMAN 5 % INTRAVENOUS SOLUTION
INTRAVENOUS | Status: DC | PRN
Start: 2017-12-02 — End: 2017-12-02

## 2017-12-02 MED ORDER — THROMBIN (RECOMBINANT) 5,000 UNIT TOPICAL SOLUTION
CUTANEOUS | Status: AC
Start: 2017-12-02 — End: 2017-12-02
  Filled 2017-12-02: qty 2

## 2017-12-02 MED ORDER — BACITRACIN 500 UNIT/G OINTMENT TUBE
TOPICAL_OINTMENT | CUTANEOUS | Status: AC
Start: 2017-12-02 — End: 2017-12-02
  Filled 2017-12-02: qty 15

## 2017-12-02 MED ORDER — CALCIUM CHLORIDE 100 MG/ML (10 %) INTRAVENOUS SYRINGE
INJECTION | Freq: Once | INTRAVENOUS | Status: DC | PRN
Start: 2017-12-02 — End: 2017-12-02
  Administered 2017-12-02: 300 mg via INTRAVENOUS
  Administered 2017-12-02: 200 mg via INTRAVENOUS
  Administered 2017-12-02: 300 mg via INTRAVENOUS
  Administered 2017-12-02: 200 mg via INTRAVENOUS

## 2017-12-02 MED ORDER — BUPIVACAINE-EPINEPHRINE (PF) 0.25 %-1:200,000 INJECTION SOLUTION
INTRAMUSCULAR | Status: AC
Start: 2017-12-02 — End: 2017-12-02
  Filled 2017-12-02: qty 30

## 2017-12-02 MED ORDER — ELECTROLYTE-A INTRAVENOUS SOLUTION
INTRAVENOUS | Status: DC | PRN
Start: 2017-12-02 — End: 2017-12-02

## 2017-12-02 MED ORDER — PHENYLEPHRINE 0.5 MG/5 ML (100 MCG/ML)IN 0.9 % SOD.CHLORIDE IV SYRINGE
INJECTION | Freq: Once | INTRAVENOUS | Status: DC | PRN
Start: 2017-12-02 — End: 2017-12-02
  Administered 2017-12-02: 152.8 ug via INTRAVENOUS
  Administered 2017-12-02: 100 ug via INTRAVENOUS
  Administered 2017-12-02: 152.8 ug via INTRAVENOUS
  Administered 2017-12-02: 100 ug via INTRAVENOUS
  Administered 2017-12-02: 152.8 ug via INTRAVENOUS
  Administered 2017-12-02: 150 ug via INTRAVENOUS
  Administered 2017-12-02: 100 ug via INTRAVENOUS
  Administered 2017-12-02: 200 ug via INTRAVENOUS

## 2017-12-02 MED ORDER — GELATIN MATRIX SEALANT (FLOSEAL) 10 ML KIT
PACK | CUTANEOUS | Status: AC
Start: 2017-12-02 — End: 2017-12-02
  Filled 2017-12-02: qty 2

## 2017-12-02 MED ORDER — SODIUM CHLORIDE 0.9 % IRRIGATION SOLUTION
1000.0000 mL | Status: DC | PRN
Start: 2017-12-02 — End: 2017-12-02
  Administered 2017-12-02 (×5): 1000 mL

## 2017-12-02 MED ORDER — PSYLLIUM ORAL PACKET WRAPPER
1.0000 | PACK | Freq: Every day | Status: DC
Start: 2017-12-02 — End: 2017-12-17
  Administered 2017-12-02 (×2): 0 via GASTROSTOMY
  Administered 2017-12-03 – 2017-12-09 (×7): 1 via GASTROSTOMY
  Administered 2017-12-10: 0 via GASTROSTOMY
  Administered 2017-12-11 – 2017-12-17 (×7): 1 via GASTROSTOMY
  Filled 2017-12-02 (×18): qty 1

## 2017-12-02 MED ORDER — GELATIN MATRIX SEALANT (FLOSEAL) 10 ML KIT
PACK | Freq: Once | CUTANEOUS | Status: DC | PRN
Start: 2017-12-02 — End: 2017-12-02

## 2017-12-02 MED ORDER — LIDOCAINE 3.6 %-MENTHOL 1.25 % TOPICAL PATCH
1.0000 | MEDICATED_PATCH | Freq: Every day | CUTANEOUS | Status: DC
Start: 2017-12-02 — End: 2017-12-17
  Administered 2017-12-02 – 2017-12-17 (×17): 1 via TRANSDERMAL
  Filled 2017-12-02 (×17): qty 1

## 2017-12-02 MED ORDER — PROPOFOL 10 MG/ML INTRAVENOUS EMULSION
INTRAVENOUS | Status: DC | PRN
Start: 2017-12-02 — End: 2017-12-02
  Administered 2017-12-02: 150 ug/kg/min via INTRAVENOUS
  Administered 2017-12-02: 0 ug/kg/min via INTRAVENOUS
  Administered 2017-12-02: 100 ug/kg/min via INTRAVENOUS
  Administered 2017-12-02: 125 ug/kg/min via INTRAVENOUS
  Administered 2017-12-02: 150 ug/kg/min via INTRAVENOUS
  Administered 2017-12-02: 175 ug/kg/min via INTRAVENOUS
  Administered 2017-12-02: 150 ug/kg/min via INTRAVENOUS

## 2017-12-02 MED ORDER — CLINDAMYCIN 900 MG/50 ML IN 5 % DEXTROSE INTRAVENOUS PIGGYBACK
INJECTION | Freq: Once | INTRAVENOUS | Status: DC | PRN
Start: 2017-12-02 — End: 2017-12-02
  Administered 2017-12-02 (×2): 900 mg via INTRAVENOUS

## 2017-12-02 MED ORDER — VANCOMYCIN 10 GRAM INTRAVENOUS SOLUTION
15.0000 mg/kg | Freq: Once | INTRAVENOUS | Status: AC
Start: 2017-12-02 — End: 2017-12-02
  Administered 2017-12-02: 1250 mg via INTRAVENOUS
  Administered 2017-12-02: 0 mg via INTRAVENOUS
  Filled 2017-12-02: qty 12.5

## 2017-12-02 MED ORDER — ELECTROLYTE-A INTRAVENOUS SOLUTION
INTRAVENOUS | Status: DC
Start: 2017-12-02 — End: 2017-12-06
  Administered 2017-12-06: 0 via INTRAVENOUS

## 2017-12-02 MED ORDER — IOVERSOL 350 MG IODINE/ML INTRAVENOUS SOLUTION
50.00 mL | INTRAVENOUS | Status: AC
Start: 2017-12-02 — End: 2017-12-02
  Administered 2017-12-02: 50 mL via INTRAVENOUS

## 2017-12-02 MED ORDER — SODIUM CHLORIDE 0.9 % IRRIGATION SOLUTION
Status: DC | PRN
Start: 2017-12-02 — End: 2017-12-02

## 2017-12-02 MED ORDER — PROPOFOL 10 MG/ML IV BOLUS
INJECTION | Freq: Once | INTRAVENOUS | Status: DC | PRN
Start: 2017-12-02 — End: 2017-12-02
  Administered 2017-12-02: 50 mg via INTRAVENOUS

## 2017-12-02 MED ORDER — VANCOMYCIN 1,000 MG IV POWDER - FOR OR
1.0000 g | Freq: Once | TOPICAL | Status: DC | PRN
Start: 2017-12-02 — End: 2017-12-02
  Administered 2017-12-02: 1 g via TOPICAL
  Filled 2017-12-02: qty 1

## 2017-12-02 MED ORDER — MIDAZOLAM 1 MG/ML INJECTION SOLUTION
Freq: Once | INTRAMUSCULAR | Status: DC | PRN
Start: 2017-12-02 — End: 2017-12-02
  Administered 2017-12-02 (×2): 1 mg via INTRAVENOUS

## 2017-12-02 MED ORDER — VANCOMYCIN 1,000 MG INTRAVENOUS INJECTION
INTRAVENOUS | Status: AC
Start: 2017-12-02 — End: 2017-12-02
  Filled 2017-12-02: qty 20

## 2017-12-02 MED ORDER — SURGIFOAM SIZE 100 CM SPONGE
VAGINAL_SPONGE | CUTANEOUS | Status: AC
Start: 2017-12-02 — End: 2017-12-02
  Filled 2017-12-02: qty 1

## 2017-12-02 MED ORDER — IOPAMIDOL 370 MG IODINE/ML (76 %) INTRAVENOUS SOLUTION
INTRAVENOUS | Status: AC
Start: 2017-12-02 — End: 2017-12-02
  Filled 2017-12-02: qty 50

## 2017-12-02 MED ORDER — EPINEPHRINE 1 MG/ML (1 ML) INJECTION SOLUTION
INTRAMUSCULAR | Status: AC
Start: 2017-12-02 — End: 2017-12-02
  Filled 2017-12-02: qty 2

## 2017-12-02 MED ORDER — BACITRACIN 500 UNIT/G OINTMENT TUBE
TOPICAL_OINTMENT | Freq: Once | CUTANEOUS | Status: DC | PRN
Start: 2017-12-02 — End: 2017-12-02

## 2017-12-02 MED ORDER — OXYCODONE 5 MG TABLET
10.0000 mg | ORAL_TABLET | ORAL | Status: DC | PRN
Start: 2017-12-02 — End: 2017-12-03

## 2017-12-02 MED ORDER — ROCURONIUM 10 MG/ML INTRAVENOUS SOLUTION
Freq: Once | INTRAVENOUS | Status: DC | PRN
Start: 2017-12-02 — End: 2017-12-02
  Administered 2017-12-02: 50 mg via INTRAVENOUS

## 2017-12-02 MED ORDER — ACETAMINOPHEN 325 MG TABLET
650.0000 mg | ORAL_TABLET | ORAL | Status: DC | PRN
Start: 2017-12-02 — End: 2017-12-03
  Administered 2017-12-03 (×3): 650 mg via GASTROSTOMY
  Filled 2017-12-02 (×3): qty 2

## 2017-12-02 MED ORDER — SURGIFOAM SIZE 100 CM SPONGE
1.0000 | VAGINAL_SPONGE | Freq: Once | CUTANEOUS | Status: DC | PRN
Start: 2017-12-02 — End: 2017-12-02
  Administered 2017-12-02 (×2): 1 via TOPICAL

## 2017-12-02 MED ORDER — SODIUM CHLORIDE 0.9 % INTRAVENOUS SOLUTION
INTRAVENOUS | Status: DC | PRN
Start: 2017-12-02 — End: 2017-12-02

## 2017-12-02 MED ORDER — OXYCODONE 5 MG TABLET
5.0000 mg | ORAL_TABLET | ORAL | Status: DC | PRN
Start: 2017-12-02 — End: 2017-12-03
  Administered 2017-12-03: 5 mg via ORAL
  Filled 2017-12-02 (×2): qty 1

## 2017-12-02 MED ORDER — FAMOTIDINE 20 MG TABLET
20.00 mg | ORAL_TABLET | Freq: Two times a day (BID) | ORAL | Status: DC
Start: 2017-12-02 — End: 2017-12-03
  Administered 2017-12-02 – 2017-12-03 (×2): 20 mg via GASTROSTOMY
  Filled 2017-12-02 (×2): qty 1

## 2017-12-02 MED ORDER — SUFENTANIL 5 MCG/ML INFUSION - FOR ANES
INTRAVENOUS | Status: DC | PRN
Start: 2017-12-02 — End: 2017-12-02
  Administered 2017-12-02: 0.1 ug/kg/h via INTRAVENOUS
  Administered 2017-12-02: .1 ug/kg/h via INTRAVENOUS
  Administered 2017-12-02: 0 ug/kg/h via INTRAVENOUS
  Administered 2017-12-02: .15 ug/kg/h via INTRAVENOUS

## 2017-12-02 MED ORDER — MAGNESIUM HYDROXIDE 400 MG/5 ML ORAL SUSPENSION
15.00 mL | Freq: Four times a day (QID) | ORAL | Status: DC | PRN
Start: 2017-12-02 — End: 2017-12-12
  Administered 2017-12-07: 1200 mg via GASTROSTOMY
  Filled 2017-12-02: qty 30

## 2017-12-02 MED ADMIN — lactated Ringers intravenous solution: INTRAVENOUS | @ 13:00:00 | NDC 00338011703

## 2017-12-02 MED ADMIN — fentaNYL (PF) 50 mcg/mL injection solution: INTRAVENOUS | @ 17:00:00

## 2017-12-02 MED ADMIN — norepinephrine bitartrate 16 mg/250 mL (64 mcg/mL) in 0.9 % NaCl IV: INTRAVENOUS | @ 16:00:00

## 2017-12-02 MED ADMIN — phenylephrine 10 mg/mL injection solution: INTRAVENOUS | @ 11:00:00

## 2017-12-02 MED ADMIN — norepinephrine bitartrate 1 mg/mL intravenous solution: INTRAVENOUS | @ 15:00:00

## 2017-12-02 MED ADMIN — zinc oxide-cod liver oil 40 % topical paste: INTRAVENOUS | @ 10:00:00 | NDC 7430000070

## 2017-12-02 MED ADMIN — electrolyte-A intravenous solution: INTRAVENOUS | @ 09:00:00

## 2017-12-02 MED ADMIN — sodium chloride 0.9 % intravenous solution: INTRAVENOUS | @ 11:00:00 | NDC 00338004904

## 2017-12-02 MED ADMIN — potassium chloride 20 mEq/L in dextrose 5 %-0.45 % sodium chloride IV: ORAL | @ 09:00:00 | NDC 00338067104

## 2017-12-02 MED ADMIN — ALBUTEROL 5 MG INHALATION: INTRAVENOUS | @ 19:00:00 | NDC 00487990130

## 2017-12-02 MED ADMIN — conjugated estrogens 25 mg solution for injection: INTRAVENOUS | @ 15:00:00

## 2017-12-02 MED ADMIN — ketorolac 30 mg/mL (1 mL) injection solution: INTRAVENOUS | @ 10:00:00

## 2017-12-02 MED ADMIN — lidocaine HCL 10 mg/mL (1 %) injection solution: ORAL | @ 08:00:00

## 2017-12-02 MED ADMIN — nicotine 14 mg/24 hr daily transdermal patch: RESPIRATORY_TRACT | @ 08:00:00

## 2017-12-02 MED ADMIN — sodium chloride 0.9 % intravenous solution: INTRAVENOUS | @ 15:00:00 | NDC 00338004904

## 2017-12-02 MED ADMIN — pantoprazole 40 mg tablet,delayed release: INTRAVENOUS | @ 14:00:00

## 2017-12-02 MED ADMIN — potassium chloride 20 mEq/L in dextrose 5 %-0.45 % sodium chloride IV: INTRAVENOUS | @ 10:00:00

## 2017-12-02 MED ADMIN — sodium chloride 0.9 % intravenous solution: RESPIRATORY_TRACT | @ 23:00:00 | NDC 00338004904

## 2017-12-02 MED ADMIN — sodium chloride 0.9 % intravenous solution: INTRAVENOUS | @ 14:00:00

## 2017-12-02 MED ADMIN — sodium chloride 0.9 % intravenous solution: INTRAVENOUS | @ 04:00:00 | NDC 00338004904

## 2017-12-02 MED ADMIN — sodium chloride 0.9 % intravenous solution: TOPICAL | @ 13:00:00 | NDC 00338004904

## 2017-12-02 SURGICAL SUPPLY — 77 items
3.5 x 240mm rod ×1 IMPLANT
BIT DRILL 3.8MM 3MM PREC NEURO STRL LF  DISP (ORTHOPEDICS (NOT IMPLANTS)) ×1 IMPLANT
BIT DRILL 3.8MM 3MM PREC NEURO_STRL LF DISP (ORTHOPEDICS (NOT IMPLANTS)) ×1
BURR SURG 2MM ELT RND CRSE DIAMOND 5 STEP NTCH STRL LF  DISP (SURGICAL CUTTING SUPPLIES) ×1 IMPLANT
BURR SURG 2MM ELT RND DIAMOND 5 STEP NTCH STRL LF  DISP (SURGICAL CUTTING SUPPLIES) ×1 IMPLANT
BURR SURG 2MM TPS ELT RND CRSE_DIAMOND 5 STEP NTCH SFT TCH (CUTTING ELEMENTS) ×1
BURR SURG 2MM TPS ELT RND DIAM_OND (CUTTING ELEMENTS) ×1
COLLAR CERV UNIV VST DIAL HT ADJ MTN RSTRC ATO LOCK (ORTHOPEDICS (NOT IMPLANTS)) ×1 IMPLANT
COLLAR CERV VST LF (ORTHOPEDICS (NOT IMPLANTS)) ×1
CONN TUBING 3/16-9/16IN ARGYLE PE 5IN 1 BARB NONST LF (Connecting Tubes/Misc) ×2 IMPLANT
CONNECTOR STR 5 IN 1_8888271510 (Connecting Tubes/Misc) ×2
CONV USE 338639 - PACK SURG CSTM SPINE NONST DISP LF (CUSTOM TRAYS & PACK) ×1
CONV USE 338639 - PACK SURG CUSTOM SPINE NONST DISP LF (CUSTOM TRAYS & PACK) ×1 IMPLANT
CONV USE ITEM 315955 - COLLAR CERV LRG CONTOUR 18INX4_IN PREMIERPRO BRTHBL STKNT (ORTHOPEDICS (NOT IMPLANTS)) ×2 IMPLANT
CONV USE ITEM 337890 - PACK SURG BSIN 2 STRL LF  DISP (CUSTOM TRAYS & PACK) ×1 IMPLANT
CONV USE ITEM 344953 - COLLAR CERV UNIV VST DIAL HT ADJ MTN RSTRC ATO LOCK (ORTHOPEDICS (NOT IMPLANTS)) ×1 IMPLANT
COVER WAND RFD STRL 50EA/CS_01-0020 (EQUIPMENT MINOR) ×1
COVER WND RF DETECT STRL CLR EQP (EQUIPMENT MINOR) ×1 IMPLANT
DRAPE CARM FLRSCP EXPD CLPSBL C-ARMOR STRL EQP (DRAPE/PACKS/SHEETS/OR TOWEL) ×1 IMPLANT
DRAPE CARM FLRSCP EXPD CLPSBL_C-ARMOR STRL EQP (DRAPE/PACKS/SHEETS/OR TOWEL) ×1
DRAPE DIAMOND FENESTRATE HKLP CLSR 121X102X77IN THR T PRXM LF  STRL DISP SURG SMS (PROTECTIVE PRODUCTS/GARMENTS) ×1 IMPLANT
DRAPE DIAMOND FENESTRATE HKLP_CLSR 121X102X77IN THR T PRXM (PROTECTIVE PRODUCTS/GARMENTS) ×1
DRAPE LEICA MICSCP 150X54IN EQ_P (DRAPE/PACKS/SHEETS/OR TOWEL) IMPLANT
DRESS TRNSPR 6X3.5IN POLYUR NONADH HYPOALL WTPRF PAD FILM TGDRM 4X1.75IN STRL LF  DISP (WOUND CARE SUPPLY) ×2 IMPLANT
DRESS TRNSPR 8X3.5IN POLYUR NONADH HYPOALL WTPRF PAD FILM TGDRM 6X1.75IN STRL LF  DISP (WOUND CARE SUPPLY) ×2 IMPLANT
DRESSING OIL EMUL NONADH 3X8IN 6113 STRL CURITY LF 24/BX (WOUND CARE SUPPLY) ×1 IMPLANT
DRESSING OIL EMUL NONADH 3X8IN 6113 STRL CURITY LF 24/BX (WOUND CARE/ENTEROSTOMAL SUPPLY) ×1
DRESSING TEGADERM W/PAD 3.5X6_3589 100EA/CS (WOUND CARE/ENTEROSTOMAL SUPPLY) ×2
DRESSING TRNSPR OCCL 3.5 X 8IN_3590 TEGADERM LF STRL 100/CS (WOUND CARE/ENTEROSTOMAL SUPPLY) ×2
ELECTRODE ESURG BLADE 2.75IN 3/32IN EDGE STRL .2IN DISP INSL STD SHAFT HEX LOCK LF (CAUTERY SUPPLIES) IMPLANT
ELECTRODE ESURG BLADE STD 2.75_IN 3/32IN STRL EDGE .2IN DISP (CAUTERY SUPPLIES)
GRAFT BONE MAGNIFUSE DBM 10X1CM ×1 IMPLANT
GRAFT BONE MAGNIFUSE DBM 5X1CM SPINE CERV POST ×1 IMPLANT
HANDLE SUCT MEDIVAC FLXCLR REG CPC FLXB CLR STRL LF  DISP (SURGICAL INSTRUMENTS) IMPLANT
HANDLE SUCT MEDIVAC FLXCLR REG_CPC FLXB CLR STRL LF DISP (INSTRUMENTS)
HEADREST POSITION PRONESAFE DERMAPROX PRN OPN CELL SPRT BS LF (POSITIONING PRODUCTS) IMPLANT
HEADREST POSITION PRONESAFE DE_RMAPROX PRN OPN CELL SPRT BS (POSITIONING PRODUCTS)
HOLDER LIMB 13X3IN UNIV QUICK RELEASE BCKL TIE 52IN NONST LF (REST) ×1 IMPLANT
KIT DRAIN 10FR PVC 3 SPRG RESERVOIR TROCAR Y CONN JP 1/8IN RND 400ML STRL LF  DISP (WOUND CARE SUPPLY) ×1 IMPLANT
KIT RM TURNOVER CLEANOP CSTM INFCT CONTROL (KITS & TRAYS (DISPOSABLE)) ×1
KIT RM TURNOVER CLEANOP CSTM I_NFCT CONTROL (KITS & TRAYS (DISPOSABLE)) ×1
KIT RM TURNOVER CLEANOP CUSTOM INFCT CONTROL (KITS & TRAYS (DISPOSABLE)) ×1 IMPLANT
KIT WND DRAIN 10FR_12/CS SU130402D (WOUND CARE/ENTEROSTOMAL SUPPLY) ×1
MILL BONE BIOPSY MED CRSE BLADE 5MM LF  DISP (SURGICAL CUTTING SUPPLIES) IMPLANT
MILL BONE BIOPSY MED CRSE BLD_5MM LF DISP (CUTTING ELEMENTS)
PACK BASIN DBL CUSTOM (CUSTOM TRAYS & PACK) ×1
PACK SURG TBG STRL DISP 30IN SIL LF (Connecting Tubes/Misc) ×2 IMPLANT
PIN ADLT MAYFIELD WNG GRV PLAS_TIC CRNM SKULL DISP STRL (TRAC) ×1
PIN ADULT MAYFIELD WNG GRV PLASTIC CRNM SKULL DISP STRL (TRAC) ×1 IMPLANT
RESERVOIR DRAIN SIL JP BULB 100CC STRL LF  DISP (WOUND CARE SUPPLY) IMPLANT
RESERVOIR DRAIN SIL JP BULB 10_0CC STRL LF DISP (WOUND CARE/ENTEROSTOMAL SUPPLY)
RESTRAINT LIMB HOLDER_M20116 QUICK RELEASE 50PR/CS (REST) ×1
SCALPEL BONE 20MM BLUNT MXB20_INCLUDES BLADE AND EXT 5EA/BX (CUTTING ELEMENTS) ×1
SET TUBING BONESCALPEL IRRG STRL LF (IRR) ×1 IMPLANT
SET TUBING BONESCALPEL IRRG ST_RL LF (IRR) ×1
SPONGE GAUZE STRL 4 X 4IN TUB_6939 1280/CS (WOUND CARE SUPPLY) ×1 IMPLANT
SPONGE GAUZE STRL 4 X 4IN TUB_6939 1280/CS (WOUND CARE/ENTEROSTOMAL SUPPLY) ×1
STIMULATOR BONE E0748 (ORTHOPEDICS (NOT IMPLANTS)) ×2 IMPLANT
SUTURE 2-0 CT1 VICRYL 27IN UNDYED BRD COAT ABS (SUTURE/WOUND CLOSURE) ×2 IMPLANT
SUTURE 2-0 CT1 VICRYL 27IN UND_YED BRD COAT ABS (SUTURE/WOUND CLOSURE) ×2
SUTURE 2-0 FS PROLENE 18IN BLU MONOF NONAB (SUTURE/WOUND CLOSURE) IMPLANT
SUTURE 2-0 FS PROLENE 18IN BLU_MONOF NONAB (SUTURE/WOUND CLOSURE)
TAPE ADH 3IN 1538-3 BX/4 ROLLS (MED/SURG TAPES) ×2 IMPLANT
TIP US 20MM BONESCALPEL STD BLUNT BLADE SHORT EXT SIL SLEEVE STRL DISP (SURGICAL CUTTING SUPPLIES) ×1 IMPLANT
TRAY SKIN SCRUB 8IN VNYL COTTON 6 WNG 6 SPONGE STICK 2 TIP APPL DRY STRL LF (KITS & TRAYS (DISPOSABLE)) ×2 IMPLANT
TRAY SPINE CUSTOM (CUSTOM TRAYS & PACK) ×1
TRAY SURG PREP SCR CR ESTM (KITS & TRAYS (DISPOSABLE)) ×4
TUBING SILICONE 30IN (Connecting Tubes/Misc) ×2
TUBING SUCT CLR 20FT 9/32IN MEDIVAC NCDTV M/M CONN STRL LF (Suction) ×1 IMPLANT
TUBING SUCT CONN 20FT LONG_STRL N720A (Suction) ×1
infinity 3.5 x 14 screw ×6 IMPLANT
infinity 3.5 x 22 screw ×2 IMPLANT
infinity 3.5 x 26 screw ×2 IMPLANT
infinity 4.5 x 30 screw ×2 IMPLANT
infinity 5.5 x 30 screw ×2 IMPLANT
large MAS crosslink ×1 IMPLANT
set screw ×14 IMPLANT

## 2017-12-02 NOTE — Care Plan (Signed)
PT attempted to provide care to [facility] today at 07:44. Care could not be delivered at that time due to scheduled to go to OR today with ortho. Will follow up with pt post-operatively as appropriate, thank you         Willaim Bane DPT, PT.  Pager [phone]

## 2017-12-02 NOTE — Anesthesia Procedure Notes (Signed)
Arterial Line Procedure        Post-procedure details:  Complications:  Performed By:  Performing provider: Clement Sayres, APRN,CRNA Authorizing provider: Shona Simpson, MD

## 2017-12-02 NOTE — Care Plan (Signed)
The patient's plan of care was reviewed with the family-in to see the patient overnight. Titrating Levo for Maps >85. Fentanyl drip for pain. Maintenance at 236ml/hr. OG removed due to coiling- attempted to place another and patient refused- patient ok with no being able to receive gabapentin tonight. Patient has been awake all night and appears frustrated with the situation. Providing care to make sure she is comfortable overnight. Turn and reposition q2hrs- log roll. Aspen collar on. Foley output 30-50 ml/hr. VSS. Will continue to monitor

## 2017-12-02 NOTE — Care Plan (Signed)
Current vent settings:  SIMV PRVC 450 VT, rate of 12, peep of 8, PS of 12, and 40%.  Patient is scheduled to go back to the OR with ortho later today and will begin weaning towards extubation afterwards.  Doesn't do well with suctioning of the cough assist.  Will continue therapy as scheduled.

## 2017-12-02 NOTE — Anesthesia Postprocedure Evaluation (Signed)
Anesthesia Post Op Evaluation    Patient: Christy Turner  Procedure(s) with comments:  LAMINECTOMY SPINE CERVICAL POSTERIOR PRONE 3 LEVELS OR MORE - C4-C7  DECOMPRESSION SPINE CERVICAL POSTERIOR FUSION WITH INSTRUMENTATION 3 LEVELS OR MORE - C2-T4    Last Vitals:Temperature: 36.7 C (98.1 F) (12/02/17 1546)  Heart Rate: 74 (12/02/17 1546)  BP (Non-Invasive): (!) 110/53 (12/02/17 1546)  Respiratory Rate: 12 (12/02/17 1546)  SpO2: 95 % (12/02/17 1546)  Pain Score (Numeric, Faces): Other (12/02/17 1710)  Patient is sufficiently recovered from the effects of anesthesia to participate in the evaluation and has returned to their pre-procedure level.  Patient location during evaluation: ICU       Patient participation: complete - patient cannot participate  Level of consciousness: sedated and obtunded/minimal responses  Pain management: adequate  Airway patency: intubated  Anesthetic complications: no  Cardiovascular status: acceptable  Respiratory status: acceptable, intubated and ventilator  Hydration status: acceptable  Patient post-procedure temperature: Pt Normothermic   PONV Status: Absent

## 2017-12-02 NOTE — Anesthesia Transfer of Care (Deleted)
ANESTHESIA TRANSFER OF CARE   Christy Turner is a 72 y.o. ,female, Weight: 76.4 kg (168 lb 6.9 oz)   had Procedure(s) with comments:  LAMINECTOMY SPINE CERVICAL POSTERIOR PRONE 3 LEVELS OR MORE - C4-C7  DECOMPRESSION SPINE CERVICAL POSTERIOR FUSION WITH INSTRUMENTATION 3 LEVELS OR MORE - C2-T4  performed  12/02/17   Primary Service: Ann Held, MD    Past Medical History:   Diagnosis Date   . 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 DVT (deep venous thrombosis) (CMS HCC)    . Multiple sclerosis (CMS HCC)    . Pacemaker       Allergy History as of 12/02/17     CEFUROXIME       Noted Status Severity Type Reaction    12/01/17 1521 Valrie Hart, MD 12/01/17 Active Low  Swelling          PENICILLINS       Noted Status Severity Type Reaction    12/02/17 0258 Dayna Barker, RN 12/02/17 Active                 I completed my transfer of care / handoff to the receiving personnel during which we discussed:  Access, Airway, All key/critical aspects of case discussed, Analgesia, Antibiotics, Expectation of post procedure, Fluids/Product, Gave opportunity for questions and acknowledgement of understanding, Labs and PMHx    Post Location: PACU                                          Additional Info:Patient transferred to SICU with BMV and fully monitored ; Report given to ICU Team; Aware to check labs; Patient placed on ventilator by Respiratory therapist; Patient stable upon arrival; VSS; Airway patent                      Last OR Temp: Temperature: 36.7 C (98.1 F)  ABG:  PH (ARTERIAL)   Date Value Ref Range Status   12/02/2017 7.37 7.35 - 7.45 Final     PH (T)   Date Value Ref Range Status   12/02/2017 7.36 7.35 - 7.45 Final     PCO2 (ARTERIAL)   Date Value Ref Range Status   12/02/2017 31.0 (L) 35.0 - 45.0 mm/Hg Final     PO2 (ARTERIAL)   Date Value Ref Range Status   12/02/2017 194.0 (H) 72.0 - 100.0 mm/Hg Final     SODIUM   Date Value Ref Range Status   12/02/2017 140 137 - 145 mmol/L Final          POTASSIUM   Date Value Ref Range Status   12/02/2017 3.9 3.5 - 5.1 mmol/L Final     KETONES   Date Value Ref Range Status   12/01/2017 >=80 (A) Negative mg/dL Final     WHOLE BLOOD POTASSIUM   Date Value Ref Range Status   12/02/2017 2.7 (L) 3.5 - 4.6 mmol/L Final     CHLORIDE   Date Value Ref Range Status   12/02/2017 114 (H) 101 - 111 mmol/L Final     CALCIUM   Date Value Ref Range Status   12/02/2017 8.6 8.5 - 10.2 mg/dL Final     Calculated P Axis   Date Value Ref Range Status   12/01/2017 -11 degrees Final     Calculated R Axis   Date Value Ref  Range Status   12/01/2017 77 degrees Final     Calculated T Axis   Date Value Ref Range Status   12/01/2017 40 degrees Final     IONIZED CALCIUM   Date Value Ref Range Status   12/02/2017 0.94 (L) 1.10 - 1.35 mmol/L Final     LACTATE   Date Value Ref Range Status   12/02/2017 2.1 (H) 0.0 - 1.3 mmol/L Final     HEMOGLOBIN   Date Value Ref Range Status   12/02/2017 7.1 (L) 12.0 - 18.0 g/dL Final     OXYHEMOGLOBIN   Date Value Ref Range Status   12/02/2017 97.7 85.0 - 98.0 % Final     CARBOXYHEMOGLOBIN   Date Value Ref Range Status   12/02/2017 1.3 0.0 - 2.5 % Final     MET-HEMOGLOBIN   Date Value Ref Range Status   12/02/2017 0.9 0.0 - 2.0 % Final     BASE EXCESS (ARTERIAL)   Date Value Ref Range Status   12/01/2017 2.7 (H) 0.0 - 1.0 mmol/L Final     BASE DEFICIT   Date Value Ref Range Status   12/02/2017 6.2 (H) 0.0 - 3.0 mmol/L Final     BICARBONATE (ARTERIAL)   Date Value Ref Range Status   12/02/2017 20.1 18.0 - 26.0 mmol/L Final     TEMPERATURE, COMP   Date Value Ref Range Status   12/02/2017 37.6 15.0 - 40.0 C Final     Airway:  EndoTracheal Tube 7.0  Lip (Active)   Airway Secure Device 12/02/2017  9:06 AM   Position Change Yes 12/02/2017  9:06 AM   Change Reason Routine 12/02/2017  9:06 AM   Bilateral General Breath Sounds Diminished 12/02/2017  8:00 AM     Blood pressure (!) 110/53, pulse 74, temperature 36.7 C (98.1 F), resp. rate 12, weight 76.4 kg (168 lb 6.9  oz), SpO2 95 %.

## 2017-12-02 NOTE — Nurses Notes (Signed)
Patient fingerstick 161. sicu notified.

## 2017-12-02 NOTE — Anesthesia OR-ICU Handoff (Signed)
Anesthesia ICU Transfer of Christy Turner is a 72 y.o. ,female, Weight: 76.4 kg (168 lb 6.9 oz)   had Procedure(s) with comments:  LAMINECTOMY SPINE CERVICAL POSTERIOR PRONE 3 LEVELS OR MORE - C4-C7  DECOMPRESSION SPINE CERVICAL POSTERIOR FUSION WITH INSTRUMENTATION 3 LEVELS OR MORE - C2-T4  performed  12/02/17   Primary Service: Ann Held, MD    Past Medical History:   Diagnosis Date   . 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 DVT (deep venous thrombosis) (CMS HCC)    . Multiple sclerosis (CMS HCC)    . Pacemaker       Allergy History as of 12/02/17     CEFUROXIME       Noted Status Severity Type Reaction    12/01/17 1521 Valrie Hart, MD 12/01/17 Active Low  Swelling          PENICILLINS       Noted Status Severity Type Reaction    12/02/17 0258 Dayna Barker, RN 12/02/17 Active                 I completed my ICU transfer of care/ Handoff to the ICU receiving personnel during which we discussed :  Access, Airway, All key and critical aspects of case discussed, Analgesia, Antibiotics, Expectation of post procedure, Fluids/Product, Gave opportunity for questions and acknowledgement of understanding, Labs and PMHx      Anesthesia Managment: This is for handoff only . For doses and times see MAR                                          Additional Info:Patient transferred to SICU fully monitored and BMV; patient stable upon arrival; Report given to ICU Team- aware to check labs (K; HGB); Patient placed on ventilator by respiratory therapist; VSS; Airway patent                     Last OR Temp: Temperature: 36.7 C (98.1 F)  ABG:  PH (ARTERIAL)   Date Value Ref Range Status   12/02/2017 7.37 7.35 - 7.45 Final     PH (T)   Date Value Ref Range Status   12/02/2017 7.36 7.35 - 7.45 Final     PCO2 (ARTERIAL)   Date Value Ref Range Status   12/02/2017 31.0 (L) 35.0 - 45.0 mm/Hg Final     PO2 (ARTERIAL)   Date Value Ref Range Status   12/02/2017 194.0 (H) 72.0 - 100.0 mm/Hg Final     SODIUM      Date Value Ref Range Status   12/02/2017 140 137 - 145 mmol/L Final     POTASSIUM   Date Value Ref Range Status   12/02/2017 3.9 3.5 - 5.1 mmol/L Final     KETONES   Date Value Ref Range Status   12/01/2017 >=80 (A) Negative mg/dL Final     WHOLE BLOOD POTASSIUM   Date Value Ref Range Status   12/02/2017 2.7 (L) 3.5 - 4.6 mmol/L Final     CHLORIDE   Date Value Ref Range Status   12/02/2017 114 (H) 101 - 111 mmol/L Final     CALCIUM   Date Value Ref Range Status   12/02/2017 8.6 8.5 - 10.2 mg/dL Final     Calculated P Axis   Date Value Ref Range Status   12/01/2017 -11  degrees Final     Calculated R Axis   Date Value Ref Range Status   12/01/2017 77 degrees Final     Calculated T Axis   Date Value Ref Range Status   12/01/2017 40 degrees Final     IONIZED CALCIUM   Date Value Ref Range Status   12/02/2017 0.94 (L) 1.10 - 1.35 mmol/L Final     LACTATE   Date Value Ref Range Status   12/02/2017 2.1 (H) 0.0 - 1.3 mmol/L Final     HEMOGLOBIN   Date Value Ref Range Status   12/02/2017 7.1 (L) 12.0 - 18.0 g/dL Final     OXYHEMOGLOBIN   Date Value Ref Range Status   12/02/2017 97.7 85.0 - 98.0 % Final     CARBOXYHEMOGLOBIN   Date Value Ref Range Status   12/02/2017 1.3 0.0 - 2.5 % Final     MET-HEMOGLOBIN   Date Value Ref Range Status   12/02/2017 0.9 0.0 - 2.0 % Final     BASE EXCESS (ARTERIAL)   Date Value Ref Range Status   12/01/2017 2.7 (H) 0.0 - 1.0 mmol/L Final     BASE DEFICIT   Date Value Ref Range Status   12/02/2017 6.2 (H) 0.0 - 3.0 mmol/L Final     BICARBONATE (ARTERIAL)   Date Value Ref Range Status   12/02/2017 20.1 18.0 - 26.0 mmol/L Final     TEMPERATURE, COMP   Date Value Ref Range Status   12/02/2017 37.6 15.0 - 40.0 C Final     Airway:  EndoTracheal Tube 7.0  Lip (Active)   Airway Secure Device 12/02/2017  9:06 AM   Position Change Yes 12/02/2017  9:06 AM   Change Reason Routine 12/02/2017  9:06 AM   Bilateral General Breath Sounds Diminished 12/02/2017  8:00 AM
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
## 2017-12-02 NOTE — Nurses Notes (Signed)
Patient refused to have her NG re-inserted. She agreed that she would be ok without receiving her gabapentin tonight. SICU aware- ok with no NG for the night.  patient may have her OG/NG placed later today. VSS. Will continue to monitor

## 2017-12-02 NOTE — Ancillary Notes (Signed)
[facility]  Spiritual Care Note    Patient Name:  Christy Turner  Date of Encounter:   12/02/2017     12/02/17 5974   Clinical Encounter Type   Reason for Visit Epic Consult   Referral From Physician   Declined Spiritual Care Visit At This Time No   Visited With Patient;Son;Daughter  (two sons and a step daughter were present.)   Patient Spiritual Encounters   Spiritual Needs/Issues Adjustment to new diagnosis;Anxiety;Grief;Despair;Helplessness   Spiritual/Coping Resources Beliefs helpful in coping;Beliefs in God/Sacred/Higher Purpose;Loved/supported by family   Coping Encouraged focus on the present;Explored emotions;Explored/supported faith and beliefs;Facilitated meaning making;Facilitated spiritual reflection;Offered empathy;Provided supportive presence;Relationship building   Financial risk analyst Provided Non-anxious presence;Anxiety management   Information/Education Provided Spiritual Care scope of service   Spiritual Care outcomes with Patient   Spiritual/Emotional Processing  Patient processed emotions;Spiritual Care relationship established    Patient Coping Coping more effectively   Family Spiritual Encounters   Spiritual Assessment Anxiety;Changes related to patient's diagnosis   Spiritual/Coping Resources Beliefs helpful in coping;Beliefs in God/Sacred/Higher Purpose;Supportive family system   Coping  Encouraged self-care;Advocated for family;Explored/supported faith and beliefs;Encouraged focus on the present;Explored emotions;Facilitated story telling;Facilitated grief;Offered empathy;Provided supportive presence;Relationship building   Designer, multimedia Provided Non-anxious presence;Anxiety management   Information Provided Spiritual Care scope of service   Spiritual Care Outcomes with Family   Spiritual/Emotional Processing Spiritual Care relationship established;Family shared/processed patient's story;Family processed emotions   Family Coping  Coping more effectively   Coping/Psychosocial   Observed Emotional State tearful/crying;accepting   Time of Encounters   Start Time 0515   Stop Time 0618   Duration (minutes) 63 Minutes     Other Pertinent Information:      Visit was in response to epic consult. First met with pt's daughter, two sons and a step daughter later joined. Provided supportive presence, acttive listening and empathy. Facilitated story telling, pt's daughter tearfully shared pt's hospitalization story. Pt's nurse, Merrily Pew, read pt's lip requesting for baptisin. Chaplain facilitated spiritual reflection with pt, she nodded her ascent to the request for baptism, which was offered. Pt and family expressed appreciation for the visit.       Octaviano Batty, CHAPLAIN RESIDENT  Pager: [phone]  Total Time of Encounter: 73 min.
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
## 2017-12-02 NOTE — Anesthesia Procedure Notes (Signed)
Arterial Line Procedure   Date/Time: 12/02/2017 9:35 AM   Pt location: In OR  Consent:     Consent given by:  Healthcare agent    Risks discussed:  Bleeding, infection, pain, poor cosmetic result, nerve damage and incomplete drainage    Alternatives discussed:  No treatment  Universal protocol:     Procedure explained and questions answered to patient or proxy's satisfaction: yes      Immediately prior to procedure a time out was called: yes      Patient identity confirmed:  Hospital-assigned identification number and arm band  Pre-procedure details:     Preparation: Preprocedure hand washing was performed; sterile field was maintained     Skin Prep used: Chlorhexidine gluconate and Isopropyl alcohol  Anesthesia (see MAR for exact dosages):     Anesthesia method:  none  A 20 G Catheter type: Arrow 1 and 3/4 inch in length,  Placed on the left  radial artery  using anatomical landmarks and guidewire With  number of attempts:1.Secured with: transparent dressing   MEDICATIONS:     Post-procedure details:    Patient tolerance of procedure:  Tolerated well, no immediate complications Waveform appropriate, Correlates with cuff, Flush/aspirate well and Calibrated  Complications:none  Performed By:  Performing provider: Shona Simpson, MD Authorizing provider: Shona Simpson, MD  I was present and supervised/observed the entire procedure.  Kalyb Pemble, APRN,CRNA 12/02/2017, 10:55

## 2017-12-02 NOTE — Anesthesia OR-ICU Handoff (Signed)
Anesthesia ICU Transfer of [place] is a 72 y.o. ,female, Weight: 76.4 kg (168 lb 6.9 oz)   had Procedure(s) with comments:  LAMINECTOMY SPINE CERVICAL POSTERIOR PRONE 3 LEVELS OR MORE - C4-C7  DECOMPRESSION SPINE CERVICAL POSTERIOR FUSION WITH INSTRUMENTATION 3 LEVELS OR MORE - C2-T4  performed  12/02/17   Primary Service: Ann Held, MD    Past Medical History:   Diagnosis Date   . 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 DVT (deep venous thrombosis) (CMS HCC)    . Multiple sclerosis (CMS HCC)    . Pacemaker       Allergy History as of 12/02/17     CEFUROXIME       Noted Status Severity Type Reaction    12/01/17 1521 Valrie Hart, MD 12/01/17 Active Low  Swelling          PENICILLINS       Noted Status Severity Type Reaction    12/02/17 0258 Dayna Barker, RN 12/02/17 Active                 I completed my ICU transfer of care/ Handoff to the ICU receiving personnel during which we discussed :  Access, Airway, All key and critical aspects of case discussed, Antibiotics, Expectation of post procedure, Fluids/Product and Labs        Recommendations: Post-Op ABG, Repeat labs and Repeat H &H                                                             Last OR Temp: Temperature: 36.7 C (98.1 F)  ABG:  PH (ARTERIAL)   Date Value Ref Range Status   12/02/2017 7.37 7.35 - 7.45 Final     PH (T)   Date Value Ref Range Status   12/02/2017 7.36 7.35 - 7.45 Final     PCO2 (ARTERIAL)   Date Value Ref Range Status   12/02/2017 31.0 (L) 35.0 - 45.0 mm/Hg Final     PO2 (ARTERIAL)   Date Value Ref Range Status   12/02/2017 194.0 (H) 72.0 - 100.0 mm/Hg Final     SODIUM   Date Value Ref Range Status   12/02/2017 140 137 - 145 mmol/L Final     POTASSIUM   Date Value Ref Range Status   12/02/2017 3.9 3.5 - 5.1 mmol/L Final     KETONES   Date Value Ref Range Status   12/01/2017 >=80 (A) Negative mg/dL Final     WHOLE BLOOD POTASSIUM   Date Value Ref Range Status   12/02/2017 2.7 (L) 3.5 - 4.6 mmol/L  Final     CHLORIDE   Date Value Ref Range Status   12/02/2017 114 (H) 101 - 111 mmol/L Final     CALCIUM   Date Value Ref Range Status   12/02/2017 8.6 8.5 - 10.2 mg/dL Final     Calculated P Axis   Date Value Ref Range Status   12/01/2017 -11 degrees Final     Calculated R Axis   Date Value Ref Range Status   12/01/2017 77 degrees Final     Calculated T Axis   Date Value Ref Range Status   12/01/2017 40 degrees Final     IONIZED CALCIUM  Date Value Ref Range Status   12/02/2017 0.94 (L) 1.10 - 1.35 mmol/L Final     LACTATE   Date Value Ref Range Status   12/02/2017 2.1 (H) 0.0 - 1.3 mmol/L Final     HEMOGLOBIN   Date Value Ref Range Status   12/02/2017 7.1 (L) 12.0 - 18.0 g/dL Final     OXYHEMOGLOBIN   Date Value Ref Range Status   12/02/2017 97.7 85.0 - 98.0 % Final     CARBOXYHEMOGLOBIN   Date Value Ref Range Status   12/02/2017 1.3 0.0 - 2.5 % Final     MET-HEMOGLOBIN   Date Value Ref Range Status   12/02/2017 0.9 0.0 - 2.0 % Final     BASE EXCESS (ARTERIAL)   Date Value Ref Range Status   12/01/2017 2.7 (H) 0.0 - 1.0 mmol/L Final     BASE DEFICIT   Date Value Ref Range Status   12/02/2017 6.2 (H) 0.0 - 3.0 mmol/L Final     BICARBONATE (ARTERIAL)   Date Value Ref Range Status   12/02/2017 20.1 18.0 - 26.0 mmol/L Final     TEMPERATURE, COMP   Date Value Ref Range Status   12/02/2017 37.6 15.0 - 40.0 C Final     Airway:  EndoTracheal Tube 7.0  Lip (Active)   Airway Secure Device 12/02/2017  3:34 PM   Position Change No 12/02/2017  3:34 PM   Change Reason Other(Comment) 12/02/2017  3:34 PM   Bilateral General Breath Sounds Diminished 12/02/2017  8:00 AM
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
## 2017-12-02 NOTE — Pharmacy Vancomycin Dosing (Signed)
[facility] / Department of Pharmaceutical Services  Therapeutic Drug Monitoring: Vancomycin  12/02/2017      Patient name: Christy Turner, Christy Turner  Date of Birth:  Nov 22, 1945    Actual Weight:  Weight: 76.4 kg (168 lb 6.9 oz) (12/02/17 0000)     BMI:       Date RPh Current regimen (including mg/kg) Indication Target Levels (mcg/mL) SCr (mg/dL) CrCl* (mL/min) Measured level (mcg/mL) Plan (including when levels are due) Comments   8/13 Kova none Post op  n/a    Vancomycin 1250 mg x 1 for post op prophylaxis                                                                               *Creatinine clearance is estimated by using the Cockcroft-Gault equation for adult patients and the Brendolyn Patty for pediatric patients.    The decision to discontinue vancomycin therapy will be determined by the primary service.  Please contact the pharmacist with any questions regarding this patient's medication regimen.

## 2017-12-02 NOTE — Care Plan (Signed)
Medical Nutrition Therapy  I:  Consult acknowledged for new SCI.  P:  Pt currently in OR, will complete consult at more appropriate time  Seward Speck, RD, LD, CNSC 12/02/2017, 09:46  Pager [phone]
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
## 2017-12-02 NOTE — Ancillary Notes (Signed)
SBIRT    SBIRT not completed at this time due to patient's medical status -intubated.  Pending recommendations:  Attempt to complete SBIRT at later time/date.    Latina Craver, [facility] Clinical Therapist 12/02/2017, 08:24  Pager  [phone]

## 2017-12-02 NOTE — Respiratory Therapy (Signed)
Patient currently on the following ventilator settings:    VENTILATOR - SIMV(PRVC)PS / APV-SIMV CONTINUOUS Discontinue   Duration: Until Specified    Priority: Routine       Question Answer Comment   FIO2 (%) 50    Peep(cm/H2O) 8    Rate(bpm) 12    Tidal Volume(mls) 450    Pressure Support(cm/H2O) 10    Indications IMPROVE DISTRIBUTION OF VENTILATION            Will continue to monitor and wean when able.

## 2017-12-02 NOTE — Care Management Notes (Addendum)
Attempted to see pt and family for initial assessment. Pt no in room, was taken to OR this morning and has not returned yet. No family in SICU waiting room at this time. Will continue to monitor pt's return to talk with pt/family.     Addendum 1631: Pt returned from surgery after 1530. I went to room at 1610, no family in room nor in waiting room. They told me pt's husband is on 10th floor and family may be there. Will f/u tomorrow.     [facility], CLINICAL CARE COORDINATOR  12/02/2017, 15:05 [phone]

## 2017-12-02 NOTE — Consults (Signed)
ORTHOPAEDIC SPINE ATTENDING    Pt examined bedside. Intubated but responsive. 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

## 2017-12-02 NOTE — Nurses Notes (Signed)
Patient to OR with RN and anesthesia. Levophed and maintenance fluids sent. Fentanyl gtt stopped and wasted in accudose.

## 2017-12-02 NOTE — Nurses Notes (Signed)
Patient admitted to SICU 11 from the OR. Primary RN and SICU at bedside. VS and assessment per flowsheet.
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
## 2017-12-03 ENCOUNTER — Inpatient Hospital Stay (HOSPITAL_COMMUNITY): Payer: Medicare HMO

## 2017-12-03 DIAGNOSIS — Z4682 Encounter for fitting and adjustment of non-vascular catheter: Secondary | ICD-10-CM

## 2017-12-03 DIAGNOSIS — I4891 Unspecified atrial fibrillation: Secondary | ICD-10-CM

## 2017-12-03 DIAGNOSIS — S2243XA Multiple fractures of ribs, bilateral, initial encounter for closed fracture: Secondary | ICD-10-CM

## 2017-12-03 LAB — URINALYSIS, MACRO/MICRO
BILIRUBIN: NEGATIVE mg/dL
BLOOD: NEGATIVE mg/dL
COLOR: NORMAL
GLUCOSE: NEGATIVE mg/dL
NITRITE: NEGATIVE
PH: 5 (ref 5.0–8.0)
PROTEIN: NEGATIVE mg/dL
SPECIFIC GRAVITY: 1.017 (ref 1.005–1.030)
UROBILINOGEN: NEGATIVE mg/dL

## 2017-12-03 LAB — PHOSPHORUS
PHOSPHORUS: 1.9 mg/dL — ABNORMAL LOW (ref 2.3–4.0)
PHOSPHORUS: 3.6 mg/dL (ref 2.3–4.0)

## 2017-12-03 LAB — CBC
HCT: 28 % — ABNORMAL LOW (ref 34.8–46.0)
HGB: 9.1 g/dL — ABNORMAL LOW (ref 11.5–16.0)
MCH: 28.7 pg (ref 26.0–32.0)
MCHC: 32.5 g/dL (ref 31.0–35.5)
MCV: 88.3 fL (ref 78.0–100.0)
PLATELETS: 91 x10ˆ3/uL — ABNORMAL LOW (ref 150–400)
RBC: 3.17 x10ˆ6/uL — ABNORMAL LOW (ref 3.85–5.22)
RDW-CV: 15.4 % (ref 11.5–15.5)
WBC: 9 x10ˆ3/uL (ref 3.7–11.0)

## 2017-12-03 LAB — MAGNESIUM: MAGNESIUM: 2 mg/dL (ref 1.6–2.6)

## 2017-12-03 LAB — POC BLOOD GLUCOSE (RESULTS)
GLUCOSE, POC: 141 mg/dL — ABNORMAL HIGH (ref 70–105)
GLUCOSE, POC: 151 mg/dL — ABNORMAL HIGH (ref 70–105)
GLUCOSE, POC: 183 mg/dL — ABNORMAL HIGH (ref 70–105)

## 2017-12-03 LAB — BASIC METABOLIC PANEL
ANION GAP: 7 mmol/L (ref 4–13)
BUN/CREA RATIO: 13 (ref 6–22)
BUN: 8 mg/dL (ref 8–25)
CALCIUM: 8 mg/dL — ABNORMAL LOW (ref 8.5–10.2)
CHLORIDE: 110 mmol/L (ref 96–111)
CO2 TOTAL: 24 mmol/L (ref 22–32)
CREATININE: 0.64 mg/dL (ref 0.49–1.10)
ESTIMATED GFR: >59 mL/min/1.73mˆ2 (ref >59)
GLUCOSE: 150 mg/dL — ABNORMAL HIGH (ref 65–139)
POTASSIUM: 3.8 mmol/L (ref 3.5–5.1)
POTASSIUM: 3.8 mmol/L (ref 3.5–5.1)
SODIUM: 141 mmol/L (ref 136–145)

## 2017-12-03 LAB — ALBUMIN: ALBUMIN: 3.3 g/dL — ABNORMAL LOW (ref 3.4–4.8)

## 2017-12-03 MED ORDER — PRENATAL VIT-IRON-FOLATE TAB WRAPPER
1.0000 | ORAL_TABLET | Freq: Every day | Status: DC
Start: 2017-12-03 — End: 2017-12-17
  Administered 2017-12-03 – 2017-12-17 (×15): 1 via GASTROSTOMY
  Filled 2017-12-03 (×15): qty 1

## 2017-12-03 MED ORDER — ASCORBIC ACID (VITAMIN C) 500 MG TABLET
500.00 mg | ORAL_TABLET | Freq: Two times a day (BID) | ORAL | Status: DC
Start: 2017-12-03 — End: 2017-12-17
  Administered 2017-12-03 – 2017-12-17 (×26): 500 mg via GASTROSTOMY
  Filled 2017-12-03 (×30): qty 1

## 2017-12-03 MED ORDER — OXYCODONE 5 MG TABLET
10.00 mg | ORAL_TABLET | ORAL | Status: DC | PRN
Start: 2017-12-03 — End: 2017-12-13
  Administered 2017-12-03 – 2017-12-07 (×17): 10 mg via GASTROSTOMY
  Administered 2017-12-07: 0 mg via GASTROSTOMY
  Administered 2017-12-07 – 2017-12-13 (×24): 10 mg via GASTROSTOMY
  Filled 2017-12-03 (×42): qty 2

## 2017-12-03 MED ORDER — OXYCODONE 5 MG TABLET
5.00 mg | ORAL_TABLET | ORAL | Status: DC | PRN
Start: 2017-12-03 — End: 2017-12-13
  Administered 2017-12-05 – 2017-12-12 (×2): 5 mg via GASTROSTOMY
  Filled 2017-12-03 (×2): qty 1

## 2017-12-03 MED ORDER — PSEUDOEPHEDRINE 30 MG TABLET
15.00 mg | ORAL_TABLET | Freq: Four times a day (QID) | ORAL | Status: DC
Start: 2017-12-03 — End: 2017-12-04
  Administered 2017-12-03 – 2017-12-04 (×3): 15 mg via GASTROSTOMY
  Filled 2017-12-03 (×3): qty 1

## 2017-12-03 MED ORDER — ACETAMINOPHEN 325 MG TABLET
975.00 mg | ORAL_TABLET | Freq: Four times a day (QID) | ORAL | Status: DC
Start: 2017-12-03 — End: 2017-12-03
  Administered 2017-12-03: 975 mg via GASTROSTOMY
  Filled 2017-12-03: qty 3

## 2017-12-03 MED ORDER — FAMOTIDINE 40 MG/5 ML (8 MG/ML) ORAL SUSPENSION
20.00 mg | Freq: Two times a day (BID) | ORAL | Status: DC
Start: 2017-12-03 — End: 2017-12-11
  Administered 2017-12-03 – 2017-12-11 (×16): 20 mg via GASTROSTOMY
  Filled 2017-12-03 (×17): qty 2.5

## 2017-12-03 MED ORDER — ACETAMINOPHEN 325 MG/10.15 ML ORAL SUSPENSION
975.00 mg | Freq: Four times a day (QID) | ORAL | Status: DC
Start: 2017-12-03 — End: 2017-12-04
  Administered 2017-12-03: 975 mg via ORAL
  Administered 2017-12-04 (×2): 650 mg via ORAL
  Filled 2017-12-03 (×3): qty 30.45

## 2017-12-03 MED ORDER — PSEUDOEPHEDRINE 30 MG TABLET
15.0000 mg | ORAL_TABLET | Freq: Four times a day (QID) | ORAL | Status: DC
Start: 2017-12-03 — End: 2017-12-03
  Administered 2017-12-03: 10:00:00 15 mg via ORAL
  Filled 2017-12-03 (×2): qty 1

## 2017-12-03 MED ORDER — SODIUM CHLORIDE 0.9 % INTRAVENOUS SOLUTION
30.0000 mmol | INTRAVENOUS | Status: DC
Start: 2017-12-03 — End: 2017-12-03

## 2017-12-03 MED ORDER — SODIUM CHLORIDE 0.9 % INTRAVENOUS SOLUTION
30.0000 mmol | Freq: Once | INTRAVENOUS | Status: AC
Start: 2017-12-03 — End: 2017-12-03
  Administered 2017-12-03: 30 mmol via INTRAVENOUS
  Filled 2017-12-03: qty 10

## 2017-12-03 MED ORDER — CALCIUM GLUCONATE 100 MG/ML (10 %) INTRAVENOUS SOLUTION
1000.0000 mg | Freq: Once | INTRAVENOUS | Status: AC
Start: 2017-12-03 — End: 2017-12-03
  Administered 2017-12-03 (×2): 1000 mg via INTRAVENOUS
  Filled 2017-12-03: qty 10

## 2017-12-03 MED ADMIN — norepinephrine bitartrate 16 mg/250 mL (64 mcg/mL) in 0.9 % NaCl IV: INTRAVENOUS | @ 20:00:00

## 2017-12-03 MED ADMIN — norepinephrine bitartrate 16 mg/250 mL (64 mcg/mL) in 0.9 % NaCl IV: INTRAVENOUS | @ 07:00:00

## 2017-12-03 MED ADMIN — sodium chloride 0.9 % (flush) injection syringe: @ 14:00:00

## 2017-12-03 MED ADMIN — pseudoephedrine 30 mg tablet: ORAL | @ 10:00:00

## 2017-12-03 MED ADMIN — oxyCODONE 5 mg tablet: ORAL

## 2017-12-03 MED ADMIN — hydrocortisone 1 % topical cream: INTRAVENOUS | NDC 45802043803

## 2017-12-03 MED ADMIN — calcium acetate(phosphate binders) 667 mg tablet: GASTROSTOMY | @ 04:00:00

## 2017-12-03 MED ADMIN — sodium chloride 0.9 % intravenous solution: GASTROSTOMY | @ 10:00:00

## 2017-12-03 MED ADMIN — electrolyte-A intravenous solution: RESPIRATORY_TRACT | @ 16:00:00 | NDC 00338022104

## 2017-12-03 MED ADMIN — sodium chloride 0.9 % (flush) injection syringe: INTRAVENOUS | @ 09:00:00

## 2017-12-03 MED ADMIN — sodium chloride 0.9 % intravenous solution: INTRAVENOUS | @ 10:00:00 | NDC 00338004904

## 2017-12-03 MED ADMIN — sennosides 8.6 mg-docusate sodium 50 mg tablet: GASTROSTOMY | @ 08:00:00
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
## 2017-12-03 NOTE — Pharmacist Med Reconciliation (Signed)
Pharmacy Medication Reconciliation    Patient Name: Christy Turner  Date of Service: 12/03/2017  Date of Admission: 12/01/2017  Date of Birth: February 09, 1946  Length of Stay:   2 days       Transitions of Care:  1. Would you like to utilize the [facility] Discharge Pharmacy?  No   -  If no, preferred pharmacy:   [facility] [place]    Do you have a way to pay for your discharge prescription medications at the time of discharge?  Did not assess    Information was collected from:  Pharmacy    Clarified Prior to Admission Medications:  Prior to Admission medications    Medication Sig Taking Resumed Y/N Comments   lisinopril (PRINIVIL) 20 mg Oral Tablet Take 20 mg by mouth Once a day Yes       metoprolol succinate (TOPROL-XL) 100 mg Oral Tablet Sustained Release 24 hr Take 100 mg by mouth Twice daily Yes       traZODone (DESYREL) 100 mg Oral Tablet Take 100 mg by mouth Every night Yes             Changes made to Prior to Admission Med List:  Med list that had been entered was for patients husband and not this patient. Erroneous meds deleted and correct list as obtained from pharmacy entered.    Allergies:  penicillin and cefuroxime (need to verify reactions when patient able to respond/ventilated)    Pharmacist Recommendations:   Antihypertensives being held due to need for MAP >85 goal due to SCI. Of note, pt has fairly high dose bblocker and a pacemaker. Should restart trazodone today.    Levonne Lapping, PHARMD

## 2017-12-03 NOTE — Progress Notes (Signed)
[place] Department of Orthopaedics  Spine Service  Attending: Kendyl Bissonnette  Progress Note  12/03/2017    Name: Christy Turner  DOB: 1946-01-19  MRN: [id number]    RECENT SURGERY:  1 Day Post-Op s/p C2-T2 PSF w/ C3-C7 decompression    SUBJECTIVE:  72 y.o. female resting in chair at bedside this AM, tube remains in place    OBJECTIVE:  AF, VSS, NAEON  GEN - NAD, non-labored breathing  Drain remains in place, UTO formal motor exam  1+ wrist extensors bilat., 1+ R gastroc.    Pertinent Labs:  CBC  (Last 48 hours)    Date/Time WBC HGB HCT MCV PLATELETS    12/03/17 0105 9.0    9.1 (L)    28.0 (L)    88.3    91 (L)       12/02/17 1555 8.8    9.6 (L)    28.9 (L)    86.8    98 (L)       12/02/17 0019 18.0 (H)    11.7    36.8    90.4    200       12/01/17 1517 7.0    12.4    38.2    88.8    152           BMP  (Last 48 hours)    Date/Time Na K Cl CO2 BUN CREAT Calcium Glucose    12/03/17 0105 141    3.8    110    24    8    0.64    8.0 (L)    150 (H)       12/02/17 1555 141    3.7    110    23    10    0.75    8.3 (L)    166 (H)       12/02/17 0019 139    3.9    106    21 (L)    10    0.76    8.6    193 (H)       12/01/17 1527 139    4.0    107    25    9    0.75    8.4 (L)    103       12/01/17 1517 -- -- -- -- 9    -- -- --    12/01/17 1517 -- -- -- -- -- 0.74    -- --          ASSESSMENT:  72 y.o. female 1 Day Post-Op s/p C2-T2 PSF w/ C3-C7 decompression    PLAN:  - Weightbearing: WBAT  - PT/OT: may begin post-op, will follow recs.  - DVT prophylaxis: SCDs, may begin chemoprophylaxis at 48 hrs. Post-op  - Antibiotics: x 24 hrs. Vancomycin post-op  - Pain: po, IV  - Drain: monitor output, will likely d/c tomorrow AM  - Dressing: c/d/i this AM, will change tomorrow AM  - Labs: per primary team  - IVF: maintenance  - Imaging: N/A  - Diet: per primary team  - Dispo: Ortho Spine to continue to follow  - Follow-up: will see back in Dr. Moss Mc clinic 2 wks. After discharge    Clarene Duke, MD  12/03/2017, 09:35      Late entry for  12/03/17. I saw and examined the patient.  I reviewed the fellow's note.  I agree with the findings and plan of care  as documented in the fellow's note.  Any exceptions/additions are edited/noted.    Sitting up in chair, intubated but alert and follows commands.     D B WE T FF HI  R 4 4- 1 0 0 0  L 4 4- 1 0 0 0  1/5 bilat gastroch  (+) Sensation to LT to C5, altered from C6 distally (absent in BLE)  Collar intact    - d/w patient and daughter intraop findings  - PT/OT  - Continue per SCI protocol  - PM&R consult once extubated, will require SCI rehab upon discharge     Neta Ehlers, MD
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
## 2017-12-03 NOTE — Ancillary Notes (Signed)
Pt was measured and fit with B/L PRAFOs,fit was good asked nurse to contact us with any questions or concerns.            [facility]  [phone]  [facility]

## 2017-12-03 NOTE — Care Management Notes (Signed)
No family at bedside, BS nurse stated that they had just left prior to be getting there. No family in SICU waiting room. I called daughter's phone, Christy Turner (MPOA per [facility] nurse), but no answer. Left message to call CM back. Trying to contact family to do initial assessment.     [facility], CLINICAL CARE COORDINATOR  12/03/2017, 11:13 [phone]
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
## 2017-12-03 NOTE — Nurses Notes (Signed)
Call from telemetry stating patient's pacer malfunctioning on telemetry.  SICU notified. Pts HR in 90s-low 100s. Per SICU, pt's pacemaker was interrogated in ED upon arrival.   No orders received at this time. RN will continue to monitor.
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
## 2017-12-03 NOTE — Nurses Notes (Signed)
Medication list reviewed with patient, able to read lips around ETT.   Patient not taking most of medications on medication list in Epic. Notes placed in medication list by RN about which medications patient takes and which doses. SICU notified of changes to medications, per patient. Patient requests home pharmacy be called with questions.Will pass along information to day shift RN, as well.

## 2017-12-03 NOTE — Respiratory Therapy (Signed)
VENTILATOR - SIMV(PRVC)PS / APV-SIMV CONTINUOUS DiscontinueReschedule   Duration: Until Specified    Priority: Routine       Question Answer Comment   FIO2 (%) 40    Peep(cm/H2O) 10    Rate(bpm) 12    Tidal Volume(mls) 450    Pressure Support(cm/H2O) 10    Indications IMPROVE DISTRIBUTION OF VENTILATION         Patient on current vent settings overnight and tolerating well. Patient tolerated cough assist ok, but did desaturate. Patient placed back on vent. Will continue to monitor and wean as tolerated.
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
## 2017-12-03 NOTE — Care Plan (Signed)
Pt on simv 450/12/40/2/12 tried sbt several times but would stop breathing

## 2017-12-04 ENCOUNTER — Inpatient Hospital Stay (HOSPITAL_COMMUNITY): Payer: Medicare HMO

## 2017-12-04 DIAGNOSIS — J9811 Atelectasis: Secondary | ICD-10-CM

## 2017-12-04 DIAGNOSIS — I499 Cardiac arrhythmia, unspecified: Secondary | ICD-10-CM

## 2017-12-04 DIAGNOSIS — R579 Shock, unspecified: Secondary | ICD-10-CM

## 2017-12-04 DIAGNOSIS — S2241XA Multiple fractures of ribs, right side, initial encounter for closed fracture: Secondary | ICD-10-CM

## 2017-12-04 DIAGNOSIS — I517 Cardiomegaly: Secondary | ICD-10-CM

## 2017-12-04 LAB — BPAM PACKED CELL ORDER
UNIT DIVISION: 0
UNIT DIVISION: 0
UNIT DIVISION: 0
UNIT DIVISION: 0
UNIT DIVISION: 0
UNIT DIVISION: 0
UNIT DIVISION: 0

## 2017-12-04 LAB — BASIC METABOLIC PANEL
ANION GAP: 6 mmol/L (ref 4–13)
BUN/CREA RATIO: 8 (ref 6–22)
BUN/CREA RATIO: 8 (ref 6–22)
BUN: 5 mg/dL — ABNORMAL LOW (ref 8–25)
CALCIUM: 8 mg/dL — ABNORMAL LOW (ref 8.5–10.2)
CALCIUM: 8 mg/dL — ABNORMAL LOW (ref 8.5–10.2)
CHLORIDE: 110 mmol/L (ref 96–111)
CO2 TOTAL: 27 mmol/L (ref 22–32)
CREATININE: 0.61 mg/dL (ref 0.49–1.10)
ESTIMATED GFR: >59 mL/min/1.73mˆ2 (ref >59)
GLUCOSE: 183 mg/dL — ABNORMAL HIGH (ref 65–139)
POTASSIUM: 3.8 mmol/L (ref 3.5–5.1)
SODIUM: 143 mmol/L (ref 136–145)
SODIUM: 143 mmol/L (ref 136–145)

## 2017-12-04 LAB — ARTERIAL BLOOD GAS/LACTATE
%FIO2 (ARTERIAL): 40 %
BASE EXCESS (ARTERIAL): 3.5 mmol/L — ABNORMAL HIGH (ref 0.0–1.0)
BICARBONATE (ARTERIAL): 27.7 mmol/L — ABNORMAL HIGH (ref 18.0–26.0)
LACTATE: 1.4 mmol/L — ABNORMAL HIGH (ref 0.0–1.3)
PAO2/FIO2 RATIO: 338 (ref <=200)
PCO2 (ARTERIAL): 47 mmHg — ABNORMAL HIGH (ref 35.0–45.0)
PH (ARTERIAL): 7.4 (ref 7.35–7.45)
PO2 (ARTERIAL): 135 mmHg — ABNORMAL HIGH (ref 72.0–100.0)

## 2017-12-04 LAB — TYPE AND CROSS RED CELLS - UNITS
ABO/RH(D): O POS
ANTIBODY SCREEN: NEGATIVE
UNITS ORDERED: 4

## 2017-12-04 LAB — ECG 12-LEAD
Atrial Rate: 81 {beats}/min
Calculated P Axis: 91 degrees
Calculated R Axis: 70 degrees
PR Interval: 214 ms
QRS Duration: 162 ms
QT Interval: 472 ms
Ventricular rate: 81 {beats}/min

## 2017-12-04 LAB — PHOSPHORUS
PHOSPHORUS: 1.4 mg/dL — ABNORMAL LOW (ref 2.3–4.0)
PHOSPHORUS: 1.7 mg/dL — ABNORMAL LOW (ref 2.3–4.0)

## 2017-12-04 LAB — POC BLOOD GLUCOSE (RESULTS)
GLUCOSE, POC: 183 mg/dL — ABNORMAL HIGH (ref 70–105)
GLUCOSE, POC: 172 mg/dL — ABNORMAL HIGH (ref 70–105)
GLUCOSE, POC: 175 mg/dL — ABNORMAL HIGH (ref 70–105)

## 2017-12-04 LAB — CBC
HCT: 26.2 % — ABNORMAL LOW (ref 34.8–46.0)
HGB: 8.5 g/dL — ABNORMAL LOW (ref 11.5–16.0)
MCH: 28.5 pg (ref 26.0–32.0)
MCHC: 32.4 g/dL (ref 31.0–35.5)
MCHC: 32.4 g/dL (ref 31.0–35.5)
MCV: 87.9 fL (ref 78.0–100.0)
PLATELETS: 86 x10ˆ3/uL — ABNORMAL LOW (ref 150–400)
RBC: 2.98 x10ˆ6/uL — ABNORMAL LOW (ref 3.85–5.22)
RDW-CV: 15.8 % — ABNORMAL HIGH (ref 11.5–15.5)
WBC: 7.3 x10ˆ3/uL (ref 3.7–11.0)

## 2017-12-04 LAB — MAGNESIUM: MAGNESIUM: 1.9 mg/dL (ref 1.6–2.6)

## 2017-12-04 MED ORDER — HYDROMORPHONE 2 MG/ML INJECTION SYRINGE
0.2000 mg | INJECTION | INTRAMUSCULAR | Status: DC | PRN
Start: 2017-12-04 — End: 2017-12-17
  Administered 2017-12-04 – 2017-12-17 (×26): 0.2 mg via INTRAVENOUS
  Filled 2017-12-04 (×28): qty 1

## 2017-12-04 MED ORDER — PSEUDOEPHEDRINE 30 MG TABLET
30.00 mg | ORAL_TABLET | Freq: Four times a day (QID) | ORAL | Status: DC
Start: 2017-12-04 — End: 2017-12-05
  Administered 2017-12-04 – 2017-12-05 (×4): 30 mg via GASTROSTOMY
  Filled 2017-12-04 (×4): qty 1

## 2017-12-04 MED ORDER — POTASSIUM, SODIUM PHOSPHATES 280 MG-160 MG-250 MG ORAL POWDER PACKET
2.0000 | Freq: Two times a day (BID) | ORAL | Status: AC
Start: 2017-12-04 — End: 2017-12-04
  Administered 2017-12-04 (×2): 2 via GASTROSTOMY
  Filled 2017-12-04 (×2): qty 2

## 2017-12-04 MED ORDER — GABAPENTIN 300 MG CAPSULE
300.00 mg | ORAL_CAPSULE | Freq: Three times a day (TID) | ORAL | Status: DC
Start: 2017-12-04 — End: 2017-12-10
  Administered 2017-12-04 – 2017-12-10 (×16): 300 mg via GASTROSTOMY
  Filled 2017-12-04 (×19): qty 1

## 2017-12-04 MED ORDER — ENOXAPARIN 30 MG/0.3 ML SUBCUTANEOUS SYRINGE
30.00 mg | INJECTION | Freq: Two times a day (BID) | SUBCUTANEOUS | Status: DC
Start: 2017-12-04 — End: 2017-12-16
  Administered 2017-12-04 – 2017-12-05 (×2): 30 mg via SUBCUTANEOUS
  Administered 2017-12-05: 0 mg via SUBCUTANEOUS
  Administered 2017-12-06 – 2017-12-16 (×21): 30 mg via SUBCUTANEOUS
  Filled 2017-12-04 (×28): qty 0.3

## 2017-12-04 MED ORDER — LIDOCAINE 3.6 %-MENTHOL 1.25 % TOPICAL PATCH
1.0000 | MEDICATED_PATCH | Freq: Every day | CUTANEOUS | Status: DC
Start: 2017-12-04 — End: 2017-12-17
  Administered 2017-12-04 – 2017-12-17 (×12): 1 via TRANSDERMAL
  Filled 2017-12-04 (×13): qty 1

## 2017-12-04 MED ORDER — ACETAMINOPHEN 325 MG/10.15 ML ORAL SUSPENSION
975.00 mg | Freq: Four times a day (QID) | ORAL | Status: DC
Start: 2017-12-04 — End: 2017-12-10
  Administered 2017-12-04 – 2017-12-05 (×3): 975 mg via GASTROSTOMY
  Administered 2017-12-05: 0 mg via GASTROSTOMY
  Administered 2017-12-05 – 2017-12-07 (×7): 975 mg via GASTROSTOMY
  Administered 2017-12-07: 0 mg via GASTROSTOMY
  Administered 2017-12-07 – 2017-12-09 (×7): 975 mg via GASTROSTOMY
  Administered 2017-12-09: 0 mg via GASTROSTOMY
  Administered 2017-12-10 (×2): 975 mg via GASTROSTOMY
  Filled 2017-12-04 (×23): qty 30.45

## 2017-12-04 MED ADMIN — cloNIDine HCL 0.1 mg tablet: INTRAVENOUS | @ 06:00:00

## 2017-12-04 MED ADMIN — DEXTROSE 5% 1/2 NORMAL SALINE W/ ADDITIVES: INTRAVENOUS | @ 22:00:00 | NDC 00264761200

## 2017-12-04 MED ADMIN — Lactobacillus rhamnosus GG 15 billion cell sprinkle capsule: TRANSDERMAL | @ 08:00:00

## 2017-12-04 MED ADMIN — artificial tears with lanolin eye ointment: INTRAVENOUS | @ 15:00:00 | NDC 00023031204

## 2017-12-04 MED ADMIN — sodium chloride 0.9 % intravenous solution: GASTROSTOMY | @ 08:00:00 | NDC 00338004904

## 2017-12-04 MED ADMIN — sodium chloride 0.9 % intravenous solution: GASTROSTOMY | @ 08:00:00 | NDC 09999927838

## 2017-12-04 NOTE — Progress Notes (Signed)
[place] [facility]  Spine Service  Attending: Mykelle Cockerell  Progress Note  12/04/2017    Name: Christy Turner  DOB: 04-Aug-1945  MRN: [id number]    RECENT SURGERY:  2 Days Post-Op s/p C2-T2 PSF w/ C3-C7 decompression    SUBJECTIVE:  72 y.o. female resting in chair at bedside this AM, tube remains in palce3    OBJECTIVE:  AF, VSS, NAEON  GEN - NAD, non-labored breathing  Non distended abdomen  Drain remains in place    Upper Extremity Motor    Left  Right      D  5  5  C5    WE  1  1  C6    Tri  1  0  C7    FF  0  0  C8    HI  0  0  T1            Lower Extremity Motor    Left  Right      HF  0  0  L2    Q  0  0  L3    TA  0  0  L4    EHL  0  0  L5    G/S  1  0  S1            Sensory    Left  Right    C5 2 2   C6 2 2   C7 1 1   C8 0 0   T1 0 0   L2 0 0   L3 0 0   L4 0 0   L5 0 0   S1 0 0           Pertinent Labs:  CBC  (Last 48 hours)    Date/Time WBC HGB HCT MCV PLATELETS    12/04/17 0027 7.3    8.5 (L)    26.2 (L)    87.9    86 (L)       12/03/17 0105 9.0    9.1 (L)    28.0 (L)    88.3    91 (L)       12/02/17 1555 8.8    9.6 (L)    28.9 (L)    86.8    98 (L)           BMP  (Last 48 hours)    Date/Time Na K Cl CO2 BUN CREAT Calcium Glucose    12/04/17 0027 143    3.8    110    27    5 (L)    0.61    8.0 (L)    183 (H)       12/03/17 0105 141    3.8    110    24    8    0.64    8.0 (L)    150 (H)       12/02/17 1555 141    3.7    110    23    10    0.75    8.3 (L)    166 (H)             ASSESSMENT:  72 y.o. female 2 Days Post-Op s/p C2-T2 PSF w/ decompression C3-C7    PLAN:  - Weightbearing: WBAT  - PT/OT: may begin post-op, will follow recs.  - DVT prophylaxis: SCDs, may begin chemoprophylaxis at 48 hrs. Post-op  - Antibiotics: x 24  hrs. Vancomycin post-op, completed  - Pain: po, IV  - Drain: d/c'ed this AM  - Dressing: c/d/i this AM, will change tomorrow AM  - Labs: per primary team  - IVF: maintenance  - Imaging: N/A  - Diet: per primary team  - Dispo: Ortho Spine to continue to follow  - Follow-up:  will see back in Dr. Moss Mc clinic 2 wks. After discharge

## 2017-12-04 NOTE — Anesthesia Preprocedure Evaluation (Signed)
ANESTHESIA PRE-OP EVALUATION  Planned Procedure: TRACHEOSTOMY (N/A )  GASTROSCOPY WITH PLACEMENT PEG TUBE (N/A Abdomen)  Review of Systems    PONV       patient summary reviewed  nursing notes reviewed        Pulmonary  negative pulmonary ROS,    Cardiovascular    Pacemaker and Hx of DVT        GI/Hepatic/Renal   negative GI/hepatic/renal ROS,      Endo/Other   neg endo/other ROS,        Neuro/Psych/MS   negative neuro/psych ROS,  MS, C-spine injury with fusion    peripheral neuropathy,  Cancer  negative hematology/oncology ROS,                  Physical Assessment      Patient summary reviewed and Nursing notes reviewed   Airway                 Endotracheal tube present      Dental                    Pulmonary    Breath sounds clear to auscultation       Cardiovascular    Rhythm: regular  Rate: Normal       Other findings            12/02/17 ECG:  Sinus rhythm  First degree AV block  Ventricular-paced rhythm , atrial sensed    12/02/17 TTE:  Conclusions:  1. The left ventricle is small. Normal left ventricular ejection fraction. LV Ejection Fraction is 70 %.  2. Resting Segmental Wall Motion Analysis: Total wall motion score is 1.00. There are no regional wall  motion abnormalities.  3. Normal right ventricle size and function. RV systolic pressure could not be determined due to the lack  of a tricuspid regurgitation Doppler signal. Echo density in right ventricle suggestive of catheter, pacer  lead, or ICD lead.  4. There is moderate primary mitral valve regurgitation. There is evidence of dynamic left ventricular  outflow tract obstruction at rest. The mitral regurgitation jet is eccentric and directed posteriorly.    12/02/17 CXR:  FINDINGS: Compared to 12/02/2017. The heart size is within normal limits.  There is no visible pneumothorax in patient with history of rib fractures.  There are new postoperative changes of the spine. A drain is present.    Endotracheal tube and pacemaker are present. Endotracheal tube  has been  mildly retracted. Enteric tube tip projects over the gastric body.      IMPRESSION:  No acute cardiopulmonary process.    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 No   abandoned hardware. Non-conditional.  Pacer dependent.   Battery status etr=8.9 years.  Mode=DDD@50  bpm.  A: 390 ohms, P=3.8 mV, threshold=0.5 V@0 .4 ms  RV: 760 ohms, R=6.0 mV, threshold=1.0 V @0 .5 ms     Ap=30% , Vp=>99%.  AT/AF burden= <1%.  No changes made today.       Plan  Planned anesthesia type: general    ASA 3     Inhalational and Intravenous induction     Anesthetic plan and risks discussed with healthcare power of attorney and patient.     Anesthesia issues/risks discussed are: Dental Injuries, PONV, Central Line Placement, Stroke, Blood Loss, Post-op Cognitive Dysfunction, Nerve Injuries, Intraoperative Awareness/ Recall, Difficult Airway, Post-op Intubation/Ventilation, Eye /Visual Loss, Cardiac Events/MI, Aspiration and Sore  Throat.    Use of blood products discussed with healthcare power of attorney whom consented to blood products.     Patient's NPO status is appropriate for Anesthesia.           Plan discussed with CRNA.                 Please keep NPO at least 8 hours prior to surgery  Please, order pacemaker interrogation     Renard Hamper, MD   PGY-3 CA-2  Department of Anesthesiology

## 2017-12-04 NOTE — Progress Notes (Signed)
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 critical care time is independent and unique to other providers (no other providers saw patient for purposes of critical care evaluation)  My critical care time involved full attention to the patients' condition and included:    Review of nursing notes and/or old charts  Review of medications, allergies, and vital signs  Documentation time  Consultant collaboration on findings and treatment options  Care, transfer of care, and discharge plans  Ordering, interpreting, and reviewing diagnostic studies/tab tests  Obtaining necessary history from family, EMS, nursing home staff and/or treating physicians    My critical care time did not include time spent teaching resident physician(s) or other services of resident physicians, or performing other reported procedures.  Total Critical Care Time: 40 minutes    Antoine Primas, MD  Assistant Professor of Surgery  Trauma, Surgical Critical Care, and [place]  12/06/2017 14:55

## 2017-12-04 NOTE — Care Plan (Signed)
[facility]  Rehabilitation Services  Physical Therapy Progress Note      Patient Name: Christy Turner  Date of Birth: 02-05-1946  Height:  163.8 cm (5' 4.5")  Weight:  80.3 kg (177 lb 0.5 oz)  Room/Bed: 11/A  Payor: PENDING AUTO INSURANCE INFO / Plan: PENDING AUTO INSURANCE INFO / Product Type: Auto /     Assessment:     Patient was able to use UE to sit upright. Unable to grip with hands. Needed assist to maintain.    Discharge Needs:   Equipment Recommendation: TBD    Discharge Disposition: inpatient rehabilitation facility    JUSTIFICATION OF DISCHARGE RECOMMENDATION   Based on current diagnosis, functional performance prior to admission, and current functional performance, this patient requires continued PT services in inpatient rehabilitation facility in order to achieve significant functional improvements in these deficit areas: gait, locomotion, and balance, muscle performance, neuromuscular.      Plan:   Continue to follow patient according to established plan of care.  The risks/benefits of therapy have been discussed with the patient/caregiver and he/she is in agreement with the established plan of care.     Subjective & Objective:        12/04/17 1102   Therapist Pager   PT Assigned/ Pager # [phone] [phone]   Rehab Session   Document Type therapy progress note (daily note)   Total PT Minutes: 19   Patient Effort good   Symptoms Noted During/After Treatment fatigue   General Information   Patient Profile Reviewed? yes   Medical Lines Arterial Line;Central Line;Telemetry;PIV Line;Foley Catheter   Respiratory Status ventilator   Mutuality/Individual Preferences   Individualized Care Needs OOB with maxi move.   Pre Treatment Status   Pre Treatment Patient Status Patient sitting in bedside chair or w/c;Call light within reach;Telephone within reach;Nurse approved session   Support Present Pre Treatment  Family present   Communication Pre Treatment  Nurse   Cognitive Assessment/Interventions      Behavior/Mood Observations cooperative   Pain Assessment   Pre/Post Treatment Pain Comment C/O pain in back of head.   Transfer Assessment/Treatment   Transfer Comment Sit upright with UE assist.   Balance Skill Training   Sitting Balance: Static poor balance   Post Treatment Status   Post Treatment Patient Status Patient sitting in bedside chair or w/c;Call light within reach;Telephone within reach   Support Present Post Treatment  Family present   Communication Post Treatement Nurse   Physical Therapy Clinical Impression   Assessment Patient was able to use UE to sit upright. Unable to grip with hands. Needed assist to maintain.   Anticipated Equipment Needs at Discharge (PT) TBD   Anticipated Discharge Disposition inpatient rehabilitation facility   Planned Therapy Interventions, PT Eval   Planned Therapy Interventions (PT) balance training;bed mobility training;transfer training       Therapist:   Berneice Gandy, PT   Pager #: [phone]

## 2017-12-04 NOTE — Nurses Notes (Signed)
Patient's monitor alarming multiple PVCs run high. SICU MDs notified and asked if they have discussed with cardiology about interrogating patient's pacer. Stated they would ask about the interrogation of the pacer. Will continue to closely monitor.

## 2017-12-04 NOTE — Nurses Notes (Signed)
Patient's telemetry alarming ST changes with intermittent a.fib alarms. SICU notified and cardiology called by SICU team. EKG and cardiology came to bedside to see patient. No orders received at this time, RN Will continue to monitor.
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
## 2017-12-04 NOTE — Respiratory Therapy (Signed)
The patient is currently on SIMV/PRVC 450/12/+10/40%/PS12. Several attempts have been made to place on PSV but he patient immediatly goes apenic. I will again attempt to do an SBT. Her device was changed out.

## 2017-12-04 NOTE — Care Plan (Signed)
Pt on simv 450/12/40/5/12 failed sbt due to sob

## 2017-12-04 NOTE — Care Plan (Signed)
[facility]  Rehabilitation Services  Occupational Therapy Progress Note    Patient Name: Christy Turner  Date of Birth: 1946-04-21  Height:  163.8 cm (5' 4.5")  Weight:  80.3 kg (177 lb 0.5 oz)  Room/Bed: 11/A  Payor: PENDING AUTO INSURANCE INFO / Plan: PENDING AUTO INSURANCE INFO / Product Type: Auto /     Assessment:    Pt tolerated OT treatment fairly well, increased alertness this date. She was able to utilizing BUE to pull forward to sit upright with Min-Mod A for trunk support.  No active tricep movement noted limiting functional use of BUE requiring DEP A for all ADLs at this time.  Pt demo's impaired activity tolerance, imparied balance, impaired strength/coordination/sensation BUE, no active movement/sensation BLE limiting functional abiltiy at this time. Recommend continued skilled OT in SCI inpatient rehab when medically stable for d/c to increase safety and (I) with ADLs/IADLs/functional mobility.      Discharge Needs:   Equipment Recommendation: to be determined    Discharge Disposition:  inpatient rehabilitation facility   The above recommendation is based upon the current examination and evaluation performed on this date. As subsequent sessions are completed, recommendations will be updated accordingly.  JUSTIFICATION OF DISCHARGE RECOMMENDATION   Based on current diagnosis, functional performance prior to admission, and current functional performance, this patient requires continued OT services in inpatient rehabilitation facility in order to achieve significant functional improvements.    Plan:   Continue to follow patient according to established plan of care.  The risks/benefits of therapy have been discussed with the patient/caregiver and he/she is in agreement with the established plan of care.     Subjective & Objective:        12/04/17 1103   Therapist Pager   OT Assigned/ Pager # Isaah Furry [phone]   Rehab Session   Document Type therapy progress note (daily note)   Total OT Minutes:  19   Patient Effort good   Symptoms Noted During/After Treatment fatigue   General Information   Patient Profile Reviewed? yes   Medical Lines Arterial Line;Central Line;Telemetry;PIV Line;Foley Catheter   Respiratory Status ventilator   Existing Precautions/Restrictions fall precautions;full code;spinal precautions   Pre Treatment Status   Pre Treatment Patient Status Patient sitting in bedside chair or w/c;Call light within reach;Telephone within reach   Support Present Pre Treatment  Family present   Communication Pre Treatment  Nurse   Communication Pre Treatment Comment Rn approving OT treatment, completed with professional assist of PT   Mutuality/Individual Preferences   Individualized Care Needs OOB via maximove, encourage participation with self care tasks   Vital Signs   Vitals Comment vss   Pain Assessment   Pre/Post Treatment Pain Comment c/o pain in posterior head, RN present and aware   Coping/Psychosocial   Observed Emotional State accepting;calm;cooperative   Plan of Care Reviewed With patient;family   Cognitive Assessment/Interventions   Behavior/Mood Observations alert;cooperative   Orientation Status unable/difficult to assess   Attention WNL/WFL   Follows Commands WFL   Comment pt alert and cooperative, following commands as physically able   RUE Assessment   RUE Strength shoulder elevation 3+/5, shoulder abducation/adduction 2+/5, horizontal abduction/adduction 2-/5, shoulder flexion 1/5, elbow flexion 2+/5, elbow extension 0/5, wrist extension 3-/5, wrist flexion 1/5, no active digit movement   LUE Assessment   LUE Strength shoulder elevation 3+/5, shoulder abducation/adduction 2+/5, horizontal abduction/adduction 2-/5, shoulder flexion 1/5, elbow flexion 2+/5, elbow extension 0/5, no active digit movement, wrist limited by art line  splint   Mobility Assessment/Training   Mobility Comment pt not appropriate for functional transfers and ambulation.  Pt sitting in bed side chair, she was able to  utilize BUE bicep to pull forward with Min-Mod A to manage trunk support, sitting upright with BUE x2   ADL Assessment/Intervention   ADL Comments pt required DEP A for all ADLs at this time d/t impaired functional use of BUE   Grooming Assessment/Training   Position supported sitting   Independence Level dependent (less than 25% patient effort)   Impairments activity tolerance impaired;balance impaired;coordination impaired;postural control impaired;strength decreased   Comment to complete hair grooming with comb   Balance Skill Training   Comment while sitting at edge of bed side chair, pt was able to utilize BUE to pull forward to unsupports sitting with Min-Mod A for trunks support, able to maintain 15-0 seconds x2   Sitting Balance: Static poor balance   Therapeutic Exercise/Activity   Comment AAROM/PROM BUE in all place   Post Treatment Status   Post Treatment Patient Status Patient sitting in bedside chair or w/c;Call light within reach;Telephone within reach   Support Present Post Treatment  Nurse present;Family present   Communication Post Treatement Nurse   Occupational Therapy Clinical Impression   Functional Level at Time of Session Pt tolerated OT treatment fairly well, increased alertness this date. She was able to utilizing BUE to pull forward to sit upright with Min-Mod A for trunk support.  No active tricep movement noted limiting functional use of BUE requiring DEP A for all ADLs at this time.  Pt demo's impaired activity tolerance, imparied balance, impaired strength/coordination/sensation BUE, no active movement/sensation BLE limiting functional abiltiy at this time. Recommend continued skilled OT in SCI inpatient rehab when medically stable for d/c to increase safety and (I) with ADLs/IADLs/functional mobility.   Anticipated Equipment Needs at Discharge to be determined   Anticipated Discharge Disposition inpatient rehabilitation facility       Therapist:   Shari Heritage, OT  Pager #: [phone]

## 2017-12-05 ENCOUNTER — Encounter

## 2017-12-05 ENCOUNTER — Inpatient Hospital Stay (HOSPITAL_COMMUNITY): Payer: Medicare HMO

## 2017-12-05 ENCOUNTER — Inpatient Hospital Stay (HOSPITAL_COMMUNITY)
Admission: RE | Admit: 2017-12-05 | Discharge: 2017-12-05 | Disposition: A | Discharge status: Home / Self Care | Payer: Medicare HMO | Source: Ambulatory Visit

## 2017-12-05 ENCOUNTER — Encounter (HOSPITAL_COMMUNITY)
Admission: EM | Disposition: A | Discharge status: Other Acute Inpatient Hospital | Payer: Self-pay | Source: Home / Self Care | Attending: SURGICAL CRITICAL CARE

## 2017-12-05 ENCOUNTER — Encounter (HOSPITAL_COMMUNITY): Payer: Self-pay

## 2017-12-05 ENCOUNTER — Inpatient Hospital Stay (HOSPITAL_COMMUNITY): Payer: Medicare HMO | Admitting: Student in an Organized Health Care Education/Training Program

## 2017-12-05 DIAGNOSIS — J9690 Respiratory failure, unspecified, unspecified whether with hypoxia or hypercapnia: Secondary | ICD-10-CM

## 2017-12-05 DIAGNOSIS — J984 Other disorders of lung: Secondary | ICD-10-CM

## 2017-12-05 DIAGNOSIS — J90 Pleural effusion, not elsewhere classified: Secondary | ICD-10-CM

## 2017-12-05 DIAGNOSIS — Z4682 Encounter for fitting and adjustment of non-vascular catheter: Secondary | ICD-10-CM

## 2017-12-05 DIAGNOSIS — R1310 Dysphagia, unspecified: Secondary | ICD-10-CM

## 2017-12-05 DIAGNOSIS — Z9884 Bariatric surgery status: Secondary | ICD-10-CM

## 2017-12-05 DIAGNOSIS — R578 Other shock: Secondary | ICD-10-CM

## 2017-12-05 HISTORY — PX: HX TRACHEOSTOMY: SHX25

## 2017-12-05 HISTORY — PX: HX GASTROSTOMY TUBE: SHX151

## 2017-12-05 LAB — CBC
HCT: 27.3 % — ABNORMAL LOW (ref 34.8–46.0)
HCT: 29 % — ABNORMAL LOW (ref 34.8–46.0)
HGB: 8.8 g/dL — ABNORMAL LOW (ref 11.5–16.0)
HGB: 8.8 g/dL — ABNORMAL LOW (ref 11.5–16.0)
HGB: 9.1 g/dL — ABNORMAL LOW (ref 11.5–16.0)
MCH: 28.9 pg (ref 26.0–32.0)
MCH: 28.4 pg (ref 26.0–32.0)
MCHC: 32.2 g/dL (ref 31.0–35.5)
MCHC: 31.4 g/dL (ref 31.0–35.5)
MCV: 89.5 fL (ref 78.0–100.0)
MCV: 90.6 fL (ref 78.0–100.0)
PLATELETS: 101 x10ˆ3/uL — ABNORMAL LOW (ref 150–400)
PLATELETS: 124 x10ˆ3/uL — ABNORMAL LOW (ref 150–400)
RBC: 3.05 x10ˆ6/uL — ABNORMAL LOW (ref 3.85–5.22)
RBC: 3.2 10*6/uL — ABNORMAL LOW (ref 3.85–5.22)
RDW-CV: 15.8 % — ABNORMAL HIGH (ref 11.5–15.5)
RDW-CV: 15.8 % — ABNORMAL HIGH (ref 11.5–15.5)
RDW-CV: 15.8 % — ABNORMAL HIGH (ref 11.5–15.5)
WBC: 7.5 x10ˆ3/uL (ref 3.7–11.0)
WBC: 6.8 x10ˆ3/uL (ref 3.7–11.0)

## 2017-12-05 LAB — ARTERIAL BLOOD GAS/LACTATE
%FIO2 (ARTERIAL): 30 %
BASE EXCESS (ARTERIAL): 5.2 mmol/L — ABNORMAL HIGH (ref 0.0–1.0)
BICARBONATE (ARTERIAL): 28.9 mmol/L — ABNORMAL HIGH (ref 18.0–26.0)
BICARBONATE (ARTERIAL): 28.9 mmol/L — ABNORMAL HIGH (ref 18.0–26.0)
LACTATE: 1.2 mmol/L (ref 0.0–1.3)
PAO2/FIO2 RATIO: 243 (ref <=200)
PCO2 (ARTERIAL): 43 mmHg (ref 35.0–45.0)
PH (ARTERIAL): 7.45 (ref 7.35–7.45)
PO2 (ARTERIAL): 73 mmHg (ref 72.0–100.0)

## 2017-12-05 LAB — BASIC METABOLIC PANEL
ANION GAP: 6 mmol/L (ref 4–13)
ANION GAP: 7 mmol/L (ref 4–13)
BUN/CREA RATIO: 8 (ref 6–22)
BUN/CREA RATIO: 10 (ref 6–22)
BUN: 5 mg/dL — ABNORMAL LOW (ref 8–25)
BUN: 5 mg/dL — ABNORMAL LOW (ref 8–25)
CALCIUM: 7.9 mg/dL — ABNORMAL LOW (ref 8.5–10.2)
CALCIUM: 7.7 mg/dL — ABNORMAL LOW (ref 8.5–10.2)
CHLORIDE: 107 mmol/L (ref 96–111)
CHLORIDE: 110 mmol/L (ref 96–111)
CO2 TOTAL: 29 mmol/L (ref 22–32)
CO2 TOTAL: 26 mmol/L (ref 22–32)
CREATININE: 0.6 mg/dL (ref 0.49–1.10)
CREATININE: 0.51 mg/dL (ref 0.49–1.10)
ESTIMATED GFR: >59 mL/min/1.73mˆ2 (ref >59)
ESTIMATED GFR: >59 mL/min/1.73mˆ2 (ref >59)
GLUCOSE: 199 mg/dL — ABNORMAL HIGH (ref 65–139)
GLUCOSE: 132 mg/dL (ref 65–139)
POTASSIUM: 3.6 mmol/L (ref 3.5–5.1)
POTASSIUM: 4.1 mmol/L (ref 3.5–5.1)
SODIUM: 142 mmol/L (ref 136–145)
SODIUM: 143 mmol/L (ref 136–145)

## 2017-12-05 LAB — ALBUMIN: ALBUMIN: 2.6 g/dL — ABNORMAL LOW (ref 3.4–4.8)

## 2017-12-05 LAB — ECG 12-LEAD
Atrial Rate: 85 {beats}/min
Calculated R Axis: 44 degrees
Calculated T Axis: -153 degrees
Calculated T Axis: -153 degrees
QRS Duration: 152 ms
QT Interval: 442 ms
Ventricular rate: 70 {beats}/min
Ventricular rate: 70 {beats}/min

## 2017-12-05 LAB — PHOSPHORUS
PHOSPHORUS: 2.1 mg/dL — ABNORMAL LOW (ref 2.3–4.0)
PHOSPHORUS: 2.7 mg/dL (ref 2.3–4.0)

## 2017-12-05 LAB — MAGNESIUM
MAGNESIUM: 2.1 mg/dL (ref 1.6–2.6)
MAGNESIUM: 2 mg/dL (ref 1.6–2.6)

## 2017-12-05 LAB — POC BLOOD GLUCOSE (RESULTS)
GLUCOSE, POC: 174 mg/dL — ABNORMAL HIGH (ref 70–105)
GLUCOSE, POC: 174 mg/dL — ABNORMAL HIGH (ref 70–105)
GLUCOSE, POC: 124 mg/dL — ABNORMAL HIGH (ref 70–105)

## 2017-12-05 SURGERY — TRACHEOSTOMY
Anesthesia: General | Site: Abdomen | Wound class: Clean Contaminated Wounds-The respiratory, GI, Genital, or urinary

## 2017-12-05 MED ORDER — MELATONIN 3 MG TABLET
3.00 mg | ORAL_TABLET | Freq: Every evening | ORAL | Status: DC
Start: 2017-12-05 — End: 2017-12-06
  Administered 2017-12-05: 3 mg via GASTROSTOMY
  Filled 2017-12-05: qty 1

## 2017-12-05 MED ORDER — SODIUM CHLORIDE 0.9 % IRRIGATION SOLUTION
1000.0000 mL | Status: DC | PRN
Start: 2017-12-05 — End: 2017-12-05
  Administered 2017-12-05: 1000 mL

## 2017-12-05 MED ORDER — ACETAMINOPHEN 1,000 MG/100 ML (10 MG/ML) INTRAVENOUS SOLUTION
Freq: Once | INTRAVENOUS | Status: DC | PRN
Start: 2017-12-05 — End: 2017-12-05
  Administered 2017-12-05: 1000 mg via INTRAVENOUS

## 2017-12-05 MED ORDER — FENTANYL (PF) 50 MCG/ML INJECTION SOLUTION
Freq: Once | INTRAMUSCULAR | Status: DC | PRN
Start: 2017-12-05 — End: 2017-12-05
  Administered 2017-12-05: 50 ug via INTRAVENOUS
  Administered 2017-12-05 (×2): 100 ug via INTRAVENOUS
  Administered 2017-12-05: 50 ug via INTRAVENOUS

## 2017-12-05 MED ORDER — POTASSIUM, SODIUM PHOSPHATES 280 MG-160 MG-250 MG ORAL POWDER PACKET
1.0000 | Freq: Once | ORAL | Status: AC
Start: 2017-12-05 — End: 2017-12-05
  Administered 2017-12-05: 1 via GASTROSTOMY
  Filled 2017-12-05: qty 1

## 2017-12-05 MED ORDER — MIDAZOLAM 1 MG/ML INJECTION SOLUTION
Freq: Once | INTRAMUSCULAR | Status: DC | PRN
Start: 2017-12-05 — End: 2017-12-05
  Administered 2017-12-05: 2 mg via INTRAVENOUS

## 2017-12-05 MED ORDER — METHOCARBAMOL 500 MG TABLET
500.00 mg | ORAL_TABLET | Freq: Four times a day (QID) | ORAL | Status: DC
Start: 2017-12-05 — End: 2017-12-09
  Administered 2017-12-05: 0 mg via ORAL
  Administered 2017-12-05 – 2017-12-09 (×15): 500 mg via ORAL
  Filled 2017-12-05 (×20): qty 1

## 2017-12-05 MED ORDER — ROCURONIUM 10 MG/ML INTRAVENOUS SOLUTION
Freq: Once | INTRAVENOUS | Status: DC | PRN
Start: 2017-12-05 — End: 2017-12-05
  Administered 2017-12-05 (×2): 50 mg via INTRAVENOUS

## 2017-12-05 MED ORDER — ELECTROLYTE-A INTRAVENOUS SOLUTION
INTRAVENOUS | Status: DC | PRN
Start: 2017-12-05 — End: 2017-12-05

## 2017-12-05 MED ORDER — PSEUDOEPHEDRINE 30 MG TABLET
45.00 mg | ORAL_TABLET | Freq: Four times a day (QID) | ORAL | Status: DC
Start: 2017-12-05 — End: 2017-12-06
  Administered 2017-12-05 (×3): 45 mg via GASTROSTOMY
  Administered 2017-12-06: 30 mg via GASTROSTOMY
  Filled 2017-12-05 (×2): qty 2
  Filled 2017-12-05 (×3): qty 1.5
  Filled 2017-12-05: qty 1

## 2017-12-05 MED ORDER — LIDOCAINE 1 %-EPINEPHRINE 1:100,000 INJECTION SOLUTION
INTRAMUSCULAR | Status: AC
Start: 2017-12-05 — End: 2017-12-05
  Filled 2017-12-05: qty 30

## 2017-12-05 MED ORDER — NUTRITION PROTEIN SUPPLEMENT - TUBE FEED
1.0000 | Freq: Three times a day (TID) | Status: DC
Start: 2017-12-05 — End: 2017-12-08
  Administered 2017-12-05 – 2017-12-08 (×9): 15 g via GASTROSTOMY

## 2017-12-05 MED ORDER — CLINDAMYCIN 900 MG/50 ML IN 5 % DEXTROSE INTRAVENOUS PIGGYBACK
900.00 mg | INJECTION | Freq: Once | INTRAVENOUS | Status: AC
Start: 2017-12-05 — End: 2017-12-05
  Administered 2017-12-05: 900 mg via INTRAVENOUS
  Filled 2017-12-05: qty 50

## 2017-12-05 MED ORDER — KETAMINE 10 MG/ML INJECTION WRAPPER
Freq: Once | INTRAMUSCULAR | Status: DC | PRN
Start: 2017-12-05 — End: 2017-12-05
  Administered 2017-12-05: 30 mg via INTRAVENOUS

## 2017-12-05 MED ORDER — ONDANSETRON HCL (PF) 4 MG/2 ML INJECTION SOLUTION
Freq: Once | INTRAMUSCULAR | Status: DC | PRN
Start: 2017-12-05 — End: 2017-12-05
  Administered 2017-12-05 (×2): 4 mg via INTRAVENOUS

## 2017-12-05 MED ORDER — METOPROLOL TARTRATE 5 MG/5 ML INTRAVENOUS SOLUTION
Freq: Once | INTRAVENOUS | Status: DC | PRN
Start: 2017-12-05 — End: 2017-12-05
  Administered 2017-12-05: 1 mg via INTRAVENOUS

## 2017-12-05 MED ADMIN — famotidine 40 mg/5 mL (8 mg/mL) oral suspension: @ 08:00:00

## 2017-12-05 MED ADMIN — senna leaf extract 176 mg/5 mL oral syrup: GASTROSTOMY | @ 23:00:00

## 2017-12-05 MED ADMIN — sodium chloride 0.9 % (flush) injection syringe: @ 08:00:00

## 2017-12-05 MED ADMIN — famotidine 40 mg/5 mL (8 mg/mL) oral suspension: @ 14:00:00

## 2017-12-05 MED ADMIN — chlorhexidine gluconate 0.12 % mouthwash: ORAL | @ 16:00:00

## 2017-12-05 MED ADMIN — acetaminophen 325 mg/10.15 mL oral suspension: GASTROSTOMY | @ 06:00:00

## 2017-12-05 MED ADMIN — senna leaf extract 176 mg/5 mL oral syrup: @ 08:00:00

## 2017-12-05 MED ADMIN — glycerin-witch hazeL 12.5 %-50 % topical pads: @ 08:00:00 | NDC 50289325001

## 2017-12-05 MED ADMIN — lactated Ringers intravenous solution: INTRAVENOUS | @ 10:00:00 | NDC 00264775000

## 2017-12-05 MED ADMIN — HYDROmorphone 2 mg/mL injection syringe: GASTROSTOMY | @ 18:00:00

## 2017-12-05 MED ADMIN — miconazole nitrate 2 % topical cream: INTRAVENOUS | NDC 50484032900

## 2017-12-05 MED ADMIN — sodium chloride 0.9 % intravenous solution: @ 14:00:00 | NDC 00338004904

## 2017-12-05 MED ADMIN — HEPARIN 1 UNIT/ML IN D10W 50 ML PEDS FLUSH: @ 12:00:00

## 2017-12-05 MED ADMIN — dextrose 5 % and lactated ringers intravenous solution: GASTROSTOMY | @ 06:00:00 | NDC 00338012504

## 2017-12-05 MED ADMIN — ZZ IMS TEMPLATE: GASTROSTOMY | @ 17:00:00

## 2017-12-05 MED ADMIN — diazePAM 5 mg tablet: @ 12:00:00

## 2017-12-05 SURGICAL SUPPLY — 97 items
APPL 70% ISPRP 2% CHG 26ML 13._2X13.2IN CHLRPRP PREP DEHP-FR (WOUND CARE/ENTEROSTOMAL SUPPLY) ×1
APPL 70% ISPRP 2% CHG 26ML CHLRPRP HI-LT ORNG PREP STRL LF  DISP CLR (WOUND CARE SUPPLY) ×3 IMPLANT
BINDER ABDOMINAL 10IN HKLP CLS_R 27-48IN UNIV NONST LF (ORTHOPEDICS (NOT IMPLANTS)) ×1
BINDER ABDOMINAL 10IN SLD PNL HKLP CLSR 27-48IN UNIV NONST LF (ORTHOPEDICS (NOT IMPLANTS)) ×3 IMPLANT
BINDER ABDOMINAL 72-84X9IN 3 P_NL ELAS HKLP CLSR XL NONST LF (ORTHOPEDICS (NOT IMPLANTS))
BINDER ABDOMINAL 82-94X9IN HKL_P CLSR 3 PNL ELAS 2XL BARI (ORTHOPEDICS (NOT IMPLANTS))
BINDER ABDOMINAL 9IN HKLP CLSR 3 PNL ELAS 75-84IN XL NONST LF (ORTHOPEDICS (NOT IMPLANTS)) IMPLANT
BINDER ABDOMINAL 9IN MULTIPANEL HKLP CLSR 85-94IN 2XL BARI NONST LF (ORTHOPEDICS (NOT IMPLANTS)) IMPLANT
BITE BLOCK ADULT LATEX FREE_000429 CS/50 (AIR)
BLANKET 3M BAIR HUG ADLT LWR B ODY 60X36IN PLMR AIR SYS LTWT (MISCELLANEOUS PT CARE ITEMS) ×4 IMPLANT
BLOCK BITE 20MM PE ADULT MOUTHPC STRAP RETENTION RIM LUM SCPSVR LF  LRG 27MM GRN NONST DISP (AIR) IMPLANT
CATH SUCT ARLF TRIFLO 10FR CNT_RL VLV STR D MRKNG LF (Suction)
CATH SUCT ARLF TRIFLO 10FR CONTROL PORT STRL LF (Suction) IMPLANT
CATH SUCT ARLF TRIFLO 14FR STR 2 3ANG EYE CONTROL PORT BVL TIP STRL LF  DISP (Suction) IMPLANT
CATH SUCT ARLF TRIFLO 14FR STR_PK CNTRL STRL LF (Suction)
CATH SUCT ARLF TRIFLO 18FR 2 3ANG EYE BVL TIP CONN CONTROL PORT STRL LF  DISP CLR (Suction) IMPLANT
CATH SUCT ARLF TRIFLO 18FR CNT_RL VLV STR D MRKNG STRL LF (Suction)
CONV USE 23866 - NEEDLE HYPO 27GA 1.5IN STD MONOJECT SS POLYPROP REG BVL LL HUB UL SHRP ANTICORE YW STRL LF  DISP (NEEDLES & SYRINGE SUPPLIES) ×3 IMPLANT
CONV USE 404719 - PACK SURG SIRUS MAYO BASIC V TBL STAND CVR 90X50IN 53X24IN LF (CUSTOM TRAYS & PACK) ×3
CONV USE ITEM 153413 - TUBE TRACH SHLY 10.8MM 6.4MM 7_4MM 6 CUF DISP INR CANN STRL (AIR) ×3 IMPLANT
CONV USE ITEM 309979 - DRAPE DIAMOND FENESTRATE HKLP CLSR 121X102X77IN THR T PRXM LF  STRL DISP SURG SMS (PROTECTIVE PRODUCTS/GARMENTS) ×3
CONV USE ITEM 337687 - DRAPE REINF FNFLD 90X44IN LF  STRL DISP SURG (EQUIPMENT MINOR)
CONV USE ITEM 337890 - PACK SURG BSIN 2 STRL LF  DISP (CUSTOM TRAYS & PACK) ×3 IMPLANT
CONV USE ITEM 337905 - KIT SURG MIN STUP STRL DISP LF (KITS & TRAYS (DISPOSABLE)) ×3 IMPLANT
CONV USE ITEM 343591 - SOLIDIFY FLUID 1500ML DSPNSR L_Q TX SOLIDIFY SFTP LTS+ DISP (STER) ×3 IMPLANT
COVER WAND RFD STRL 50EA/CS_01-0020 (EQUIPMENT MINOR) ×1
COVER WND RF DETECT STRL CLR EQP (EQUIPMENT MINOR) ×3 IMPLANT
DISC USE 162466 - BAG DRAIN UROLOGY 2000ML LF_154003 20EA/CS (UROLOGICAL SUPPLIES) ×3 IMPLANT
DISC USE 162466 BAG DRAIN UROLOGY 2000ML LF_154003 20EA/CS (UROLOGICAL SUPPLIES) ×1
DONUT POSITION 9IN HEAD FM (SUPP) ×1
DRAPE 2 LYR ABS 70X40IN MED UN_IV LF DISP SURG BILAMINATE (PROTECTIVE PRODUCTS/GARMENTS) ×1
DRAPE DIAMOND FENESTRATE HKLP CLSR 121X102X77IN THR T PRXM LF  STRL DISP SURG SMS (PROTECTIVE PRODUCTS/GARMENTS) ×3 IMPLANT
DRAPE DIAMOND FENESTRATE HKLP_CLSR 121X102X77IN THR T PRXM (PROTECTIVE PRODUCTS/GARMENTS) ×1
DRAPE FNFLD SHEET 70X40IN MED PRXM LF  STRL DISP SURG SMS (PROTECTIVE PRODUCTS/GARMENTS) ×3 IMPLANT
DRAPE REINF FNFLD 90X44IN LF  STRL DISP SURG (EQUIPMENT MINOR) IMPLANT
DRAPE REINF FNFLD 90X44IN LF_STRL DISP SURG (EQUIPMENT MINOR)
DUPE USE ITEM 319443 - SUTURE SILK 3-0 PERMAHAND 24IN_BLK BRD TIE NONAB (SUTURE/WOUND CLOSURE) ×3 IMPLANT
DUPE USE ITEM 319444 - SUTURE 0 V-7 PROLENE 36IN BLU_2 ARM MONOF NONAB (SUTURE/WOUND CLOSURE) ×3 IMPLANT
ELECTRODE ESURG BLADE PNCL 10FT VLAB STRL SS DISP BUTTON SWH HEX LOCK CORD HLSTR LF  ACPT 3/32IN STD (CAUTERY SUPPLIES) ×3 IMPLANT
ELECTRODE ESURG BLADE PNCL 10F_T VLAB STRL SS DISP BUTTON SWH (CAUTERY SUPPLIES) ×1
ELECTRODE PATIENT RTN 9FT VLAB C30- LB RM PHSV ACRL FOAM CORD NONIRRITATE NONSENSITIZE ADH STRP (CAUTERY SUPPLIES) ×3 IMPLANT
ELECTRODE PATIENT RTN 9FT VLAB_REM C30- LB PLHSV ACRL FOAM (CAUTERY SUPPLIES) ×1
GASTRO TUBE 20FR MIC RADOPQ UNIV FEED PORT CONN INTERNAL RETENTION BAL RCS DIST TIP SIL 7-10ML STRL (FEED) ×3 IMPLANT
GAUZE SURG 4X4IN RFDETECT RF A_SSURE COTTON 16 PLY XRY DTBL (WOUND CARE/ENTEROSTOMAL SUPPLY)
GAUZE SURG 4X4IN STD RFDETECT COTTON 16 PLY XRY ABS LF  STRL DISP (WOUND CARE SUPPLY) IMPLANT
GOWN SURG XL AAMI L3 NONREINFO_RCE HKLP CLSR STRL LTX PNK SMS (DGOW) ×2
GOWN SURG XL L3 NONREINFORCE HKLP CLSR STRL LTX PNK SMS 47IN (DGOW) ×3 IMPLANT
HANDLE RIGID PLASTIC STRL LF  DISP DVN EZ HNDL SURG LIGHT (INSTRUMENTS) ×3
HANDLE RIGID PLASTIC STRL LF_DISP DVN EZ HNDL SURG LIGHT (INSTRUMENTS) ×2
HANDPC SUCT MEDIVAC YANKAUER BLBS TIP CLR STRL LF  DISP (Suction) ×3 IMPLANT
HANDPC SUCT MEDIVAC YANKAUER B_LBS TIP CLR STRL LF DISP (Suction) ×1
HEMOSTAT ABS 8X4IN FLXB SHR WV_SRGCL STRL DISP (WOUND CARE SUPPLY) ×3 IMPLANT
HEMOSTAT ABS 8X4IN FLXB SHR WV_SRGCL STRL DISP (WOUND CARE/ENTEROSTOMAL SUPPLY) ×1
HOLDER TRACH TUBE_8197M 12EA/BX (AIR) ×1
HOLDER TUBE MED CUFFLATOR PSY FOAM TRACH TIE NK COL PAD HKLP CLSR 9-17IN 8.5X1IN TRACH LF (AIR) ×3 IMPLANT
JELLY LUB PDI BCTRST H2O SOL N ONSTAIN NGRS STRL GLYC MTHY (INSTRUMENTS) ×2
JELLY LUB PDI BCTRST H2O SOL N ONSTAIN NGRS STRL GLYC MTHY (SURGICAL INSTRUMENTS) ×6 IMPLANT
KIT ENDOS ENDOZIME BDSD SLR SPONGE ENZM DETERGENT 500ML (DIS) ×3 IMPLANT
KIT ENDOS ENZM BDSD SLR SPONGE_ENZM DTRG 500ML (DIS) ×1
KIT MINOR SURG SET UP_CS/28 (KITS & TRAYS (DISPOSABLE)) ×1
KIT PEG 20FR SIL PUL RADOPQ ME_D PORT MIC SECURLOK 12ML STRL ×3 IMPLANT
KIT RM TURNOVER CLEANOP CSTM INFCT CONTROL (KITS & TRAYS (DISPOSABLE)) ×3
KIT RM TURNOVER CLEANOP CSTM I_NFCT CONTROL (KITS & TRAYS (DISPOSABLE)) ×1
KIT RM TURNOVER CLEANOP CUSTOM INFCT CONTROL (KITS & TRAYS (DISPOSABLE)) ×3 IMPLANT
KIT SURG MIN STUP STRL DISP LF (KITS & TRAYS (DISPOSABLE)) ×3
LABEL E-Z STICK_STLEZP1 100EA/CS (LABELS/CHART SUPPLIES) ×1
LABEL MED EZ PEEL MRKR LF (LABELS/CHART SUPPLIES) ×3 IMPLANT
MBO USE ITEM 317672 - HANDLE RIGID PLASTIC STRL LF  DISP DVN EZ HNDL SURG LIGHT (SURGICAL INSTRUMENTS) ×3 IMPLANT
NEEDLE HYPO  27GA 1.5IN STD MONOJECT SS POLYPROP REG BVL LL (NEEDLES & SYRINGE SUPPLIES) ×1
PACK BASIC_DYNJP1020S 5/CS (CUSTOM TRAYS & PACK) ×1
PACK BASIN DBL CUSTOM (CUSTOM TRAYS & PACK) ×1
PACK SURG SIRUS MAYO BASIC V TBL STAND CVR 90X50IN 53X24IN LF (CUSTOM TRAYS & PACK) ×3 IMPLANT
PAD ARMBOARD FOAM BLU_FP-ECARM (POSITIONING PRODUCTS) ×2
PAD ARMBRD BLU (POSITIONING PRODUCTS) ×6 IMPLANT
POSITION POSITION 9IN DONUT HEAD FOAM (SUPP) ×3 IMPLANT
SOLIDIFY FLUID 1500ML DSPNSR L_Q TX SOLIDIFY SFTP LTS+ DISP (STER) ×4
SPONGE DISSECTOR KITTNER STRL MDS73512 1/4X9/16IN 100PK5/CS (WOUND CARE/ENTEROSTOMAL SUPPLY) ×1
SPONGE GAUZE 4X4IN EXCLN AMD PHMB 6 PLY ANTIMIC NWVN HYPOALL LF  STRL DISP (WOUND CARE SUPPLY) ×3 IMPLANT
SPONGE LAP 9/16X.25IN DSCT RADOPQ FBR STRL (WOUND CARE SUPPLY) ×3 IMPLANT
SPONGE SPLIT DRAIN_7088 12BX/CS 25PK/BX (WOUND CARE/ENTEROSTOMAL SUPPLY) ×1
SUTURE 0 V-7 PROLENE 36IN BLU_2 ARM MONOF NONAB (SUTURE/WOUND CLOSURE) ×1
SUTURE 3-0 PS-1 ETHILON 18.0I_N BLK NYLON MONOF NYL N/ABSB (SUTURE/WOUND CLOSURE) ×4 IMPLANT
SUTURE SILK 2-0 KS PERMAHAND 30IN BLK BRD NONAB (SUTURE/WOUND CLOSURE) IMPLANT
SUTURE SILK 2-0 KS PERMAHAND 3_0IN BLK BRD NONAB (SUTURE/WOUND CLOSURE)
SUTURE SILK 2-0 SH PERMAHAND 30IN BLK BRD NONAB (SUTURE/WOUND CLOSURE) ×3 IMPLANT
SUTURE SILK 2-0 SH PERMAHAND 3_0IN BLK BRD NONAB (SUTURE/WOUND CLOSURE) ×1
SUTURE SILK 3-0 PERMAHAND 24IN_BLK BRD TIE NONAB (SUTURE/WOUND CLOSURE) ×1
SYRINGE BD LL 10ML LF STRL CO_NTROL CONCEN TIP PRGN FREE (NEEDLES & SYRINGE SUPPLIES) ×2
SYRINGE LL 10ML LF  STRL CONTROL CONCEN TIP PRGN FREE DEHP-FR MED DISP (NEEDLES & SYRINGE SUPPLIES) ×3 IMPLANT
SYRINGE LL 30ML LF  STRL CONCEN TIP GRAD N-PYRG DEHP-FR MED DISP (NEEDLES & SYRINGE SUPPLIES) ×3 IMPLANT
SYRINGE LL 30ML LF STRL CONCE_N TIP GRAD N-PYRG DEHP-FR MED (NEEDLES & SYRINGE SUPPLIES) ×1
TUBE GASTRO MIC SECURLOK 20FR_MED PORT RADOPQ STRP RTNT BAL (FEED) ×1
TUBE TRACH SHLY 10.8MM 6.4MM 7_4MM 6 CUF DISP INR CANN STRL (AIR) ×1
TUBING SUCT CLR 20FT 9/32IN MEDIVAC NCDTV M/M CONN STRL LF (Suction) ×3 IMPLANT
TUBING SUCT CLR 6FT 3/16IN MEDIVAC MXGR MALE TO MALE CONN NCDTV STRL LF (Suction) ×3 IMPLANT
TUBING SUCT CONN 20FT LONG_STRL N720A (Suction) ×1
TUBING SUCT CONN 3/16X72IN_STRL N56A (Suction) ×1
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
## 2017-12-05 NOTE — Consults (Signed)
Attempted to see patient but in the OR for trach. Will see on Monday   Ensure she is receiving full bowel program:  UMN Neurogenic Bowel- Avoid opiate medications if possible. Should be on Senokot q 12hours to maintain stool consistency   SCI bowel program as follows: Bowel program should occur at the same time each day if possible. It is best to Utilize gastrocolic reflex by performing program within 30 minutes of a meal. Place patient in left side lying and insert suppository,(Dulcolax or equivalent) wait 15 min. Perform digital stimulation in clockwise direction for 1 min every 5-10 minuates up to 3x or bowel movement is achieved.       Theodoro Kalata, PA-C  12/05/2017, 13:31

## 2017-12-05 NOTE — Care Plan (Signed)
Pt went to OR today for trach and peg.  Pt had a #6 cuffed trach placed.  Pt was awake and alert and following commands and able to participate in cough assist and FVC this evening.  Pt did well with cough assist with 20 and -20 pressures but she did drop SAT into the 70's with doing this test.  Pt placed back on vent and recovered very quickly with SAT into the upper 90's.  Pt obtained a FVC average of 0.3 liters with 0.4 liters being her best attempt.  I spoke with trauma and SICU and decided not to place pt on ATC this evening due to pt being exhausted.  Pt remains on vent and will continue to wean as tolerated.

## 2017-12-05 NOTE — Respiratory Therapy (Signed)
Pt remains paralyzed from meds in OR at this time and not able to perform FVC or cough assist.  Will attempt testing when pt is recovered from OR.

## 2017-12-05 NOTE — Care Plan (Signed)
[facility]  Rehabilitation Services  Physical Therapy Progress Note      Patient Name: Christy Turner  Date of Birth: 12-25-1945  Height:  163.8 cm (5' 4.5")  Weight:  88.9 kg (195 lb 15.8 oz)  Room/Bed: 11/A  Payor: OTHER AUTO / Plan: OTHER AUTO / Product Type: Auto /     Assessment:     Patient tolerated PROM to LE's. Will plan to sit bedside as able.     Discharge Needs:   Equipment Recommendation: TBD    Discharge Disposition: inpatient rehabilitation facility    JUSTIFICATION OF DISCHARGE RECOMMENDATION   Based on current diagnosis, functional performance prior to admission, and current functional performance, this patient requires continued PT services in inpatient rehabilitation facility in order to achieve significant functional improvements in these deficit areas: gait, locomotion, and balance, muscle performance, neuromuscular.      Plan:   Continue to follow patient according to established plan of care.  The risks/benefits of therapy have been discussed with the patient/caregiver and he/she is in agreement with the established plan of care.     Subjective & Objective:        12/05/17 1550   Therapist Pager   PT Assigned/ Pager # steve [phone]   Rehab Session   Document Type therapy progress note (daily note)   Total PT Minutes: 16   Patient Effort good   Symptoms Noted During/After Treatment fatigue   General Information   Patient Profile Reviewed? yes   Medical Lines Arterial Line;Telemetry;PIV Line;Central Line;Foley Catheter   Respiratory Status ventilator   Mutuality/Individual Preferences   Individualized Care Needs OOB with maxi move   Pre Treatment Status   Pre Treatment Patient Status Patient supine in bed;Call light within reach;Telephone within reach;Nurse approved session   Support Present Pre Treatment  Family present   Communication Pre Treatment  Nurse   Pain Assessment   Pre/Post Treatment Pain Comment No C/O pain   Therapeutic Exercise/Activity   Comment PROM to LE's   Physical  Therapy Clinical Impression   Assessment Patient tolerated PROM to LE's. Will plan to sit bedside as able.    Anticipated Equipment Needs at Discharge (PT) TBD   Anticipated Discharge Disposition inpatient rehabilitation facility   Planned Therapy Interventions, PT Eval   Planned Therapy Interventions (PT) balance training;bed mobility training;transfer training       Therapist:   Berneice Gandy, PT   Pager #: [phone]
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
## 2017-12-05 NOTE — Anesthesia Transfer of Care (Signed)
ANESTHESIA TRANSFER OF CARE   Christy Turner is a 72 y.o. ,female, Weight: 88.9 kg (195 lb 15.8 oz)   had Procedure(s) with comments:  TRACHEOSTOMY  INSERTION GASTROSTOMY TUBE - open   performed  12/05/17   Primary Service: Ann Held, MD    Past Medical History:   Diagnosis Date   . 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 DVT (deep venous thrombosis) (CMS HCC)    . Multiple sclerosis (CMS HCC)    . Pacemaker       Allergy History as of 12/05/17     CEFUROXIME       Noted Status Severity Type Reaction    12/01/17 1521 Valrie Hart, MD 12/01/17 Active Low  Swelling          PENICILLINS       Noted Status Severity Type Reaction    12/02/17 0258 Dayna Barker, RN 12/02/17 Active                 I completed my transfer of care / handoff to the receiving personnel during which we discussed:  All key/critical aspects of case discussed and Gave opportunity for questions and acknowledgement of understanding    Post Location: ICU (SICU BED # 11)                                          Additional Info:THE PATIENT WAS TRANSFERRED TO THE SICU BED # 11 IN STABLE CONDITION. SEDATED. TRACHEOTOMIZED. MANUALLY VENTILATED WITH 100% FIO2 AND PEEP. VSS ON BASELINE LEVOPHED INFUSION. TRANSFERRED WITH EKG, SAO2, AND NIBP MONITORING. TOLERATED PROCEDURE, ANESTHESIA AND TRANSFER WELL. TRANSFER OF CARE TO THE SICU CRITICAL CARE REGISTERED NURSING STAFF.                       Last OR Temp: Temperature: 36.9 C (98.4 F)  ABG:  PH (ARTERIAL)   Date Value Ref Range Status   12/04/2017 7.40 7.35 - 7.45 Final     PH (T)   Date Value Ref Range Status   12/02/2017 7.36 7.35 - 7.45 Final     PCO2 (ARTERIAL)   Date Value Ref Range Status   12/04/2017 47.0 (H) 35.0 - 45.0 mm/Hg Final     PO2 (ARTERIAL)   Date Value Ref Range Status   12/04/2017 135.0 (H) 72.0 - 100.0 mm/Hg Final     SODIUM   Date Value Ref Range Status   12/02/2017 140 137 - 145 mmol/L Final     POTASSIUM   Date Value Ref Range Status   12/05/2017 3.6 3.5 -  5.1 mmol/L Final     KETONES   Date Value Ref Range Status   12/03/2017 5  (A) Negative mg/dL Final     WHOLE BLOOD POTASSIUM   Date Value Ref Range Status   12/02/2017 2.7 (L) 3.5 - 4.6 mmol/L Final     CHLORIDE   Date Value Ref Range Status   12/02/2017 114 (H) 101 - 111 mmol/L Final     CALCIUM   Date Value Ref Range Status   12/05/2017 7.9 (L) 8.5 - 10.2 mg/dL Final     Calculated P Axis   Date Value Ref Range Status   12/02/2017 91 degrees Final     Calculated R Axis   Date Value Ref Range Status   12/02/2017 70 degrees  Final     Calculated T Axis   Date Value Ref Range Status   12/02/2017 -82 degrees Final     IONIZED CALCIUM   Date Value Ref Range Status   12/02/2017 0.94 (L) 1.10 - 1.35 mmol/L Final     LACTATE   Date Value Ref Range Status   12/04/2017 1.4 (H) 0.0 - 1.3 mmol/L Final     HEMOGLOBIN   Date Value Ref Range Status   12/02/2017 7.1 (L) 12.0 - 18.0 g/dL Final     OXYHEMOGLOBIN   Date Value Ref Range Status   12/02/2017 97.7 85.0 - 98.0 % Final     CARBOXYHEMOGLOBIN   Date Value Ref Range Status   12/02/2017 1.3 0.0 - 2.5 % Final     MET-HEMOGLOBIN   Date Value Ref Range Status   12/02/2017 0.9 0.0 - 2.0 % Final     BASE EXCESS (ARTERIAL)   Date Value Ref Range Status   12/04/2017 3.5 (H) 0.0 - 1.0 mmol/L Final     BASE DEFICIT   Date Value Ref Range Status   12/02/2017 6.2 (H) 0.0 - 3.0 mmol/L Final     BICARBONATE (ARTERIAL)   Date Value Ref Range Status   12/04/2017 27.7 (H) 18.0 - 26.0 mmol/L Final     TEMPERATURE, COMP   Date Value Ref Range Status   12/02/2017 37.6 15.0 - 40.0 C Final     Airway:  EndoTracheal Tube 7.0  Lip (Active)   Airway Secure Device 12/05/2017  7:23 AM   Position Change Yes 12/05/2017  7:23 AM   Change Reason Routine 12/05/2017  7:23 AM   Retaped N 12/04/2017  7:21 AM   Bilateral General Breath Sounds Diminished 12/02/2017  8:00 AM       Tracheostomy 6 Cuffed (Active)     Blood pressure (!) 150/80, pulse (!) 102, temperature 36.9 C (98.4 F), resp. rate 12, height 1.638 m  (5' 4.5"), weight 88.9 kg (195 lb 15.8 oz), SpO2 95 %.

## 2017-12-05 NOTE — Consults (Signed)
[facility] of [facility]  Spine Service  Attending: Jazzlin Clements  Progress Note  12/05/2017    Name: Christy Turner  DOB: Aug 23, 1945  MRN: [id number]    RECENT SURGERY:  3 Days Post-Op s/p C2-T2 PSF w/C3-C7 decompression    SUBJECTIVE:  72 y.o. female resting in bed. Intubated and frustrated with difficulty communicating. Failed spontaneous breathing trial due to SOB yesterday.    OBJECTIVE:  Spiked a temp of 38.5 overnight and was tachy up to 118  GEN - NAD, resting in bed, intubated  Non distended abdomen      Upper Extremity Motor    Left  Right      D  4  4  C5    WE  1  1  C6    Tri  0  0  C7    FF  0  0  C8    HI  0  0  T1            Fingers held in flexion.    Lower Extremity Motor    Left  Right      HF  0  0  L2    Q  0  0  L3    TA  0  0  L4    EHL  0  0  L5    G/S  1  0  S1            Sensory    Left  Right    C5 2 2   C6 2 2   C7 1 0   C8 1 0   T1 0 0   L2 0 0   L3 0 0   L4 0 0   L5 0 0   S1 0 0               Pertinent Labs:  CBC  (Last 48 hours)    Date/Time WBC HGB HCT MCV PLATELETS    12/05/17 0120 7.5    8.8 (L)    27.3 (L)    89.5    101 (L)       12/04/17 0027 7.3    8.5 (L)    26.2 (L)    87.9    86 (L)           BMP  (Last 48 hours)    Date/Time Na K Cl CO2 BUN CREAT Calcium Glucose    12/05/17 0121 142    3.6    107    29    5 (L)    0.60    7.9 (L)    199 (H)       12/04/17 0027 143    3.8    110    27    5 (L)    0.61    8.0 (L)    183 (H)             ASSESSMENT:  72 y.o. female 3 Days Post-Op s/p C2-T2 PSF w/ decompression C3-C7    PLAN:  - Weightbearing: WBAT  - PT/OT: recommend inpatient rehab  - DVT prophylaxis: per primary, on lovenox  - Pain: po, IV  - Dressing: changed today plan to change POD3. Incision was clean without surrounding erythema or drainage.   - Foley: in place  - Labs: WBC not elevated  - IVF: maintenance  - Imaging: none new  - Diet: Intubated  - Follow-up: will coordinate follow up  with Dr. Herbie Saxon closer to discharge    Marlou Starks, MD  Resident,  PGY-1  Department of Orthopaedics  Pager [phone]  12/05/2017 07:38       I saw and examined the patient.  I reviewed the resident's note.  I agree with the findings and plan of care as documented in the resident's note.  Any exceptions/additions are edited/noted.    Intubated, for trach-peg today  Deltoids not tested but at least 3/5  Biceps 4/5 bilat  WE 2/5 R, 1/5 L  TCPS/FF/HI 0/5  LE unchanged from yesterday    Neta Ehlers, MD

## 2017-12-05 NOTE — Nurses Notes (Signed)
Patient returned to SICU 11 from OR. Primary RN and respiratory at bedside. SICU notified. See flow sheets for assessment/ vitals.

## 2017-12-05 NOTE — Ancillary Notes (Signed)
SBIRT    SBIRT not completed at this time due to patient's medical status -intubated.  Pending recommendations:  Attempt to complete SBIRT at later time/date.    Jimmey Ralph, CT Clinical Therapist 12/05/2017, 08:50  Pager  [phone]

## 2017-12-05 NOTE — Care Plan (Signed)
[facility]  Rehabilitation Services  Occupational Therapy Progress Note    Patient Name: Christy Turner  Date of Birth: October 28, 1945  Height:  163.8 cm (5' 4.5")  Weight:  88.9 kg (195 lb 15.8 oz)  Room/Bed: 11/A  Payor: OTHER AUTO / Plan: OTHER AUTO / Product Type: Auto /     Assessment:    Pt tolerated OT treatment fairly, lethargic d/t recently returning from OR.  Pt completing AAROM/PROM of BUE in all planes and family educated on PROM to maintain joint integrity.  At this time pt would require total assist with all ADLs and mobility d/t impaired strength/sesnation/coordination in BUE, no active movement in BLE, impaired trunk strength, impaired balance, imapired activity tolerance.  Recommend continued skilled OT in SCI inpatient rehab to increase safety and (I) with ADLs/IADLs/functional mobility and reduce caregiver burden when medically stable for d/c.      Discharge Needs:   Equipment Recommendation: to be determined    Discharge Disposition:  inpatient rehabilitation facility   The above recommendation is based upon the current examination and evaluation performed on this date. As subsequent sessions are completed, recommendations will be updated accordingly.  JUSTIFICATION OF DISCHARGE RECOMMENDATION   Based on current diagnosis, functional performance prior to admission, and current functional performance, this patient requires continued OT services in inpatient rehabilitation facility in order to achieve significant functional improvements.    Plan:   Continue to follow patient according to established plan of care.  The risks/benefits of therapy have been discussed with the patient/caregiver and he/she is in agreement with the established plan of care.     Subjective & Objective:        12/05/17 1551   Therapist Pager   OT Assigned/ Pager # Asia [phone] [phone]   Rehab Session   Document Type therapy progress note (daily note)   Total OT Minutes: 16   Patient Effort adequate   Symptoms Noted  During/After Treatment fatigue   Symptoms Noted Comment pt reporting feeling "loopy" following OR   General Information   Patient Profile Reviewed? yes   Medical Lines Arterial Line;Telemetry;PIV Line;Central Line;Foley Catheter   Respiratory Status ventilator   Existing Precautions/Restrictions fall precautions;full code;spinal precautions   Pre Treatment Status   Pre Treatment Patient Status Patient supine in bed;Call light within reach;Telephone within reach   Support Present Pre Treatment  Family present;Nurse present   Communication Pre Treatment  Nurse   Communication Pre Treatment Comment RN approving OT treatment, completed with professional assist of PT   Mutuality/Individual Preferences   Individualized Care Needs OOB via maximove, ROM with routine self care   Pain Assessment   Pre/Post Treatment Pain Comment no c/o pain   Coping/Psychosocial   Observed Emotional State calm;cooperative   Plan of Care Reviewed With patient;family   Cognitive Assessment/Interventions   Behavior/Mood Observations lethargic;cooperative   Orientation Status unable/difficult to assess   Attention mild impairment;distractible   Follows Commands WFL   Comment pt lethargic and reporting feeling "loopy" following returning from OR.    RUE Assessment   RUE Strength shoulder elevation 3+/5, shoulder abducation/adduction 2+/5, horizontal abduction/adduction 2-/5, shoulder flexion 1/5, elbow flexion 2+/5, elbow extension 0/5, wrist extension 3-/5, wrist flexion 0/5, no active digit movement   RUE Tone mildly increased tone   LUE Assessment   LUE Strength shoulder elevation 3+/5, shoulder abducation/adduction 2+/5, horizontal abduction/adduction 2-/5, shoulder flexion 1/5, elbow flexion 2+/5, elbow extension 0/5, no active digit movement, wrist limited by art line splint   Mobility  Assessment/Training   Mobility Comment pt remaining supine throughout d/t recently returning from OR for trach and PEG and with increased fatigue   ADL  Assessment/Intervention   ADL Comments pt would require total assist with ADLs at this time d/t impaired functional use of BUE   Balance Skill Training   Comment NT   Therapeutic Exercise/Activity   Comment AAROM/PROM to BUE in all planes, educated daughter and son on elevation to reduce BUE edema and PROM to BUE  and of wrist and digits in tenodesis pattern to maintain joint integrity.  Family meeting with children, sister, other family member via phone and medical team educating family on plan of care and resources and answering questions lasting 75 minutes.   Post Treatment Status   Post Treatment Patient Status Patient supine in bed;Call light within reach;Telephone within reach   Support Present Post Treatment  Nurse present;Family present   Communication Post Treatement Nurse   Occupational Therapy Clinical Impression   Functional Level at Time of Session Pt tolerated OT treatment fairly, lethargic d/t recently returning from OR.  Pt completing AAROM/PROM of BUE in all planes and family educated on PROM to maintain joint integrity.  At this time pt would require total assist with all ADLs and mobility d/t impaired strength/sesnation/coordination in BUE, no active movement in BLE, impaired trunk strength, impaired balance, imapired activity tolerance.  Recommend continued skilled OT in SCI inpatient rehab to increase safety and (I) with ADLs/IADLs/functional mobility and reduce caregiver burden when medically stable for d/c.   Anticipated Equipment Needs at Discharge to be determined   Anticipated Discharge Disposition inpatient rehabilitation facility       Therapist:   Shari Heritage, OT  Pager #: [phone]

## 2017-12-05 NOTE — Nurses Notes (Signed)
Pt transported to the OR with anesthesia. Levophed infusing at .96mcg/kg/min.
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
## 2017-12-05 NOTE — Anesthesia Postprocedure Evaluation (Signed)
Anesthesia Post Op Evaluation    Patient: Christy Turner  Procedure(s) with comments:  TRACHEOSTOMY  INSERTION GASTROSTOMY TUBE - open     Last Vitals:Temperature: 36.9 C (98.4 F) (12/05/17 1330)  Heart Rate: (!) 102 (12/05/17 1330)  BP (Non-Invasive): (!) 150/80 (12/05/17 1330)  Respiratory Rate: 12 (12/05/17 1330)  SpO2: 95 % (12/05/17 1330)  Pain Score (Numeric, Faces): Other (12/05/17 1437)  Patient is sufficiently recovered from the effects of anesthesia to participate in the evaluation and has returned to their pre-procedure level.  Patient location during evaluation: ICU       Patient participation: complete - patient participated  Level of consciousness: awake and alert and responsive to verbal stimuli  Pain management: adequate  Airway patency: patent (Trached)  Anesthetic complications: no  Cardiovascular status: acceptable  Respiratory status: acceptable  Hydration status: acceptable  Patient post-procedure temperature: Pt Normothermic   PONV Status: Absent

## 2017-12-05 NOTE — Anesthesia OR-ICU Handoff (Signed)
Anesthesia ICU Transfer of Christy Turner is a 72 y.o. ,female, Weight: 88.9 kg (195 lb 15.8 oz)   had Procedure(s) with comments:  [place] - open   performed  12/05/17   Primary Service: Ann Held, MD    Past Medical History:   Diagnosis Date   . 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 DVT (deep venous thrombosis) (CMS HCC)    . Multiple sclerosis (CMS HCC)    . Pacemaker       Allergy History as of 12/05/17     CEFUROXIME       Noted Status Severity Type Reaction    12/01/17 1521 Valrie Hart, MD 12/01/17 Active Low  Swelling          PENICILLINS       Noted Status Severity Type Reaction    12/02/17 0258 Dayna Barker, RN 12/02/17 Active                 I completed my ICU transfer of care/ Handoff to the ICU receiving personnel during which we discussed :  Access, All key and critical aspects of case discussed, Expectation of post procedure and Fluids/Product                                                                     Last OR Temp: Temperature: 36.9 C (98.4 F)  ABG:  PH (ARTERIAL)   Date Value Ref Range Status   12/05/2017 7.45 7.35 - 7.45 Final     PH (T)   Date Value Ref Range Status   12/02/2017 7.36 7.35 - 7.45 Final     PCO2 (ARTERIAL)   Date Value Ref Range Status   12/05/2017 43.0 35.0 - 45.0 mm/Hg Final     PO2 (ARTERIAL)   Date Value Ref Range Status   12/05/2017 73.0 72.0 - 100.0 mm/Hg Final     SODIUM   Date Value Ref Range Status   12/02/2017 140 137 - 145 mmol/L Final     POTASSIUM   Date Value Ref Range Status   12/05/2017 4.1 3.5 - 5.1 mmol/L Final     KETONES   Date Value Ref Range Status   12/03/2017 5  (A) Negative mg/dL Final     WHOLE BLOOD POTASSIUM   Date Value Ref Range Status   12/02/2017 2.7 (L) 3.5 - 4.6 mmol/L Final     CHLORIDE   Date Value Ref Range Status   12/02/2017 114 (H) 101 - 111 mmol/L Final     CALCIUM   Date Value Ref Range Status   12/05/2017 7.7 (L) 8.5 - 10.2 mg/dL Final     Calculated P Axis      Date Value Ref Range Status   12/02/2017 91 degrees Final     Calculated R Axis   Date Value Ref Range Status   12/02/2017 70 degrees Final     Calculated T Axis   Date Value Ref Range Status   12/02/2017 -82 degrees Final     IONIZED CALCIUM   Date Value Ref Range Status   12/02/2017 0.94 (L) 1.10 - 1.35 mmol/L Final     LACTATE   Date Value Ref Range  Status   12/05/2017 1.2 0.0 - 1.3 mmol/L Final     HEMOGLOBIN   Date Value Ref Range Status   12/02/2017 7.1 (L) 12.0 - 18.0 g/dL Final     OXYHEMOGLOBIN   Date Value Ref Range Status   12/02/2017 97.7 85.0 - 98.0 % Final     CARBOXYHEMOGLOBIN   Date Value Ref Range Status   12/02/2017 1.3 0.0 - 2.5 % Final     MET-HEMOGLOBIN   Date Value Ref Range Status   12/02/2017 0.9 0.0 - 2.0 % Final     BASE EXCESS (ARTERIAL)   Date Value Ref Range Status   12/05/2017 5.2 (H) 0.0 - 1.0 mmol/L Final     BASE DEFICIT   Date Value Ref Range Status   12/02/2017 6.2 (H) 0.0 - 3.0 mmol/L Final     BICARBONATE (ARTERIAL)   Date Value Ref Range Status   12/05/2017 28.9 (H) 18.0 - 26.0 mmol/L Final     TEMPERATURE, COMP   Date Value Ref Range Status   12/02/2017 37.6 15.0 - 40.0 C Final     Airway:  EndoTracheal Tube 7.0  Lip (Active)   Airway Secure Device 12/05/2017  7:23 AM   Position Change Yes 12/05/2017  7:23 AM   Change Reason Routine 12/05/2017  7:23 AM   Retaped N 12/04/2017  7:21 AM   Bilateral General Breath Sounds Diminished 12/02/2017  8:00 AM       Tracheostomy 6 Cuffed (Active)   Trach Status Cuff Inflated 12/05/2017  1:24 PM

## 2017-12-05 NOTE — Brief Op Note (Signed)
[facility] MEDICINE                                          BRIEF OPERATIVE NOTE    Patient Name: Christy Turner, Christy Turner Number: [id number]  Date of Service: 12/05/2017   Date of Birth: 1946-04-12    Pre-Operative Diagnosis: SCI   Post-Operative Diagnosis: SCI  Procedure(s)/Description:  Open tracheostomy and open Stamm Gastrostomy tube  Findings: history of gastric bypass, extrathoracic innominate artery      Attending Surgeon: Babs Sciara, MD  Assistant(s): Dene Gentry, DO     Anesthesia Type: General endotracheal anesthesia  Estimated Blood Loss:  Minimal  Blood Given: None  Fluids Given: 1100 cc's crystalloid  Complications:  None  Characteristic Event: None  Did the use of current and/or prior Anticoagulants impact the outcome of the case?: no    Wound Class: Clean Contaminated Wounds -Respiratory, GI, Genital, or Urinary    Tubes: Gastrostomy Tube  Drains: None  Specimens/ Cultures: none  Implants: none           Disposition: ICU - intubated and hemodynamically stable.  Condition: stable    Dene Gentry, DO  12/05/2017, 13:23  Department of Surgery  PGY-4  Pager [phone]       I was present for all key and/or critical portions of the case and immediately available at all times.      Babs Sciara, MD 12/05/2017, 17:10

## 2017-12-05 NOTE — Respiratory Therapy (Signed)
VENTILATOR - SIMV(PRVC)PS / APV-SIMV CONTINUOUS Discontinue   Duration: Until Specified    Priority: Routine       Question Answer Comment   FIO2 (%) 40    Peep(cm/H2O) 10    Rate(bpm) 12    Tidal Volume(mls) 450    Pressure Support(cm/H2O) 12    Indications IMPROVE DISTRIBUTION OF VENTILATION

## 2017-12-06 ENCOUNTER — Inpatient Hospital Stay (HOSPITAL_COMMUNITY): Payer: Medicare HMO

## 2017-12-06 DIAGNOSIS — Z930 Tracheostomy status: Secondary | ICD-10-CM

## 2017-12-06 DIAGNOSIS — J984 Other disorders of lung: Secondary | ICD-10-CM

## 2017-12-06 LAB — URINALYSIS, MACROSCOPIC
BILIRUBIN: NEGATIVE mg/dL
BILIRUBIN: NEGATIVE mg/dL
COLOR: NORMAL
KETONES: NEGATIVE mg/dL
PH: 6 (ref 5.0–8.0)
PROTEIN: 100 mg/dL — AB
SPECIFIC GRAVITY: 1.025 (ref 1.005–1.030)
SPECIFIC GRAVITY: 1.025 (ref 1.005–1.030)
UROBILINOGEN: NEGATIVE mg/dL

## 2017-12-06 LAB — BASIC METABOLIC PANEL
ANION GAP: 5 mmol/L (ref 4–13)
BUN/CREA RATIO: 17 (ref 6–22)
BUN: 11 mg/dL (ref 8–25)
CALCIUM: 7.8 mg/dL — ABNORMAL LOW (ref 8.5–10.2)
CHLORIDE: 104 mmol/L (ref 96–111)
CO2 TOTAL: 28 mmol/L (ref 22–32)
CREATININE: 0.64 mg/dL (ref 0.49–1.10)
ESTIMATED GFR: >59 mL/min/1.73mˆ2 (ref >59)
GLUCOSE: 261 mg/dL — ABNORMAL HIGH (ref 65–139)
POTASSIUM: 3.9 mmol/L (ref 3.5–5.1)
SODIUM: 137 mmol/L (ref 136–145)

## 2017-12-06 LAB — ARTERIAL BLOOD GAS/LACTATE
%FIO2 (ARTERIAL): 30 %
BASE EXCESS (ARTERIAL): 3 mmol/L — ABNORMAL HIGH (ref 0.0–1.0)
BICARBONATE (ARTERIAL): 27.2 mmol/L — ABNORMAL HIGH (ref 18.0–26.0)
LACTATE: 1.3 mmol/L (ref 0.0–1.3)
PAO2/FIO2 RATIO: 283 (ref <=200)
PCO2 (ARTERIAL): 49 mmHg — ABNORMAL HIGH (ref 35.0–45.0)
PH (ARTERIAL): 7.38 (ref 7.35–7.45)
PH (ARTERIAL): 7.38 (ref 7.35–7.45)
PO2 (ARTERIAL): 85 mmHg (ref 72.0–100.0)

## 2017-12-06 LAB — CBC
HCT: 29.1 % — ABNORMAL LOW (ref 34.8–46.0)
HGB: 9 g/dL — ABNORMAL LOW (ref 11.5–16.0)
MCH: 28.1 pg (ref 26.0–32.0)
MCHC: 30.9 g/dL — ABNORMAL LOW (ref 31.0–35.5)
MCV: 90.9 fL (ref 78.0–100.0)
MPV: 10.9 fL (ref 8.7–12.5)
PLATELETS: 152 x10ˆ3/uL (ref 150–400)
RBC: 3.2 10*6/uL — ABNORMAL LOW (ref 3.85–5.22)
RDW-CV: 15.9 % — ABNORMAL HIGH (ref 11.5–15.5)
WBC: 7.3 x10ˆ3/uL (ref 3.7–11.0)

## 2017-12-06 LAB — URINALYSIS, MICROSCOPIC
RBCS: >182 /HPF — ABNORMAL HIGH (ref <6.0)
RBCS: >182 /hpf — ABNORMAL HIGH (ref <6.0)
WBCS: 100 /HPF — ABNORMAL HIGH (ref <11.0)
WBCS: 100 /hpf — ABNORMAL HIGH (ref <11.0)

## 2017-12-06 LAB — MRSA SCREEN, PCR, RAPID: MRSA COLONIZATION SCREEN: NEGATIVE

## 2017-12-06 LAB — PHOSPHORUS
PHOSPHORUS: 2.1 mg/dL — ABNORMAL LOW (ref 2.3–4.0)
PHOSPHORUS: 2.3 mg/dL (ref 2.3–4.0)

## 2017-12-06 LAB — POC BLOOD GLUCOSE (RESULTS)
GLUCOSE, POC: 231 mg/dL — ABNORMAL HIGH (ref 70–105)
GLUCOSE, POC: 257 mg/dL — ABNORMAL HIGH (ref 70–105)
GLUCOSE, POC: 221 mg/dL — ABNORMAL HIGH (ref 70–105)

## 2017-12-06 LAB — MAGNESIUM: MAGNESIUM: 2.1 mg/dL (ref 1.6–2.6)

## 2017-12-06 MED ORDER — PSEUDOEPHEDRINE 60 MG TABLET
60.00 mg | ORAL_TABLET | Freq: Four times a day (QID) | ORAL | Status: DC
Start: 2017-12-06 — End: 2017-12-08
  Administered 2017-12-06 – 2017-12-08 (×8): 60 mg via GASTROSTOMY
  Filled 2017-12-06 (×8): qty 1

## 2017-12-06 MED ORDER — POTASSIUM, SODIUM PHOSPHATES 280 MG-160 MG-250 MG ORAL POWDER PACKET
1.0000 | Freq: Once | ORAL | Status: AC
Start: 2017-12-06 — End: 2017-12-06
  Administered 2017-12-06: 1 via GASTROSTOMY
  Filled 2017-12-06: qty 1

## 2017-12-06 MED ORDER — INSULIN LISPRO 100 UNIT/ML SUB-Q SSIP
3.00 [IU] | INJECTION | Freq: Four times a day (QID) | SUBCUTANEOUS | Status: DC | PRN
Start: 2017-12-06 — End: 2017-12-08
  Administered 2017-12-06: 3 [IU] via SUBCUTANEOUS
  Administered 2017-12-06: 6 [IU] via SUBCUTANEOUS
  Administered 2017-12-07 (×2): 3 [IU] via SUBCUTANEOUS
  Administered 2017-12-07 – 2017-12-08 (×4): 6 [IU] via SUBCUTANEOUS
  Filled 2017-12-06: qty 3

## 2017-12-06 MED ORDER — MELATONIN 3 MG TABLET
6.00 mg | ORAL_TABLET | Freq: Every evening | ORAL | Status: DC
Start: 2017-12-06 — End: 2017-12-17
  Administered 2017-12-06 – 2017-12-16 (×10): 6 mg via GASTROSTOMY
  Filled 2017-12-06 (×5): qty 2
  Filled 2017-12-06: qty 1
  Filled 2017-12-06 (×4): qty 2
  Filled 2017-12-06: qty 1
  Filled 2017-12-06: qty 2

## 2017-12-06 MED ADMIN — ibuprofen 200 mg tablet: SUBCUTANEOUS | @ 02:00:00

## 2017-12-06 MED ADMIN — heparin lock flush (porcine) 10 unit/mL intravenous solution: SUBCUTANEOUS | @ 06:00:00

## 2017-12-06 MED ADMIN — amino ac-protein hydro-whey protein 10 gram-100 kcal/30 mL oral liquid: GASTROSTOMY | @ 22:00:00

## 2017-12-06 MED ADMIN — sodium chloride 0.9 % intravenous solution: GASTROSTOMY | @ 05:00:00 | NDC 00338004904

## 2017-12-06 MED ADMIN — FLUoxetine 10 mg capsule: INTRAVENOUS | @ 06:00:00

## 2017-12-06 MED ADMIN — lactated Ringers intravenous solution: GASTROSTOMY | @ 09:00:00

## 2017-12-06 MED ADMIN — ONDANSETRON/ DEXAMETHASONE IVPB: TRANSDERMAL | @ 21:00:00 | NDC 00143989001

## 2017-12-06 MED ADMIN — sodium chloride 0.9 % intravenous solution: TRANSDERMAL | @ 09:00:00 | NDC 00338004903

## 2017-12-06 MED ADMIN — nystatin 100,000 unit/gram topical powder: INTRAVENOUS | @ 09:00:00 | NDC 00574200815

## 2017-12-06 NOTE — Ancillary Notes (Signed)
SBIRT    SBIRT not completed at this time due to patient's mental status - unable to vocalize.  Pending recommendations:  Attempt to complete SBIRT at later time/date.    Garlan Fair, [facility] Clinical Therapist 12/06/2017, 08:50  Pager  [phone]
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
## 2017-12-06 NOTE — Consults (Signed)
[place] Department of Orthopaedics  Spine Service  Attending: Korinne Greenstein  Progress Note  12/06/2017    Name: Christy Turner  DOB: 1946/04/14  MRN: [id number]    RECENT SURGERY:  4 Days s/p C2-T2 PSF w/C3-C7 decompression and 1 day post op from trach and G-tube placement.    SUBJECTIVE:  72 y.o. female resting in bed. No longer intubated, but still unable to vocalize. Can move her mouth but no sound comes out.    OBJECTIVE:  Febrile overnight to 102.4 and has been mildly tachy to 108.  GEN - NAD, resting in bed, tracheostomy in place  Non distended abdomen      Upper Extremity Motor    Left  Right      D  4  4  C5    WE  0  0  C6    Tri  0  0  C7    FF  0  0  C8    HI  0  0  T1            Fingers held in flexion.    Lower Extremity Motor    Left  Right      HF  0  0  L2    Q  0  0  L3    TA  0  0  L4    EHL  0  0  L5    G/S  0 0  S1            Sensory    Left  Right    C5 2 2   C6 2 2   C7 1 0   C8 0 0   T1 0 0   L2 0 0   L3 0 0   L4 0 0   L5 0 0   S1 0 0               Pertinent Labs:  CBC  (Last 48 hours)    Date/Time WBC HGB HCT MCV PLATELETS    12/06/17 0132 7.3    9.0 (L)    29.1 (L)    90.9    152       12/05/17 1347 6.8    9.1 (L)    29.0 (L)    90.6    124 (L)       12/05/17 0120 7.5    8.8 (L)    27.3 (L)    89.5    101 (L)           BMP  (Last 48 hours)    Date/Time Na K Cl CO2 BUN CREAT Calcium Glucose    12/06/17 0132 137    3.9    104    28    11    0.64    7.8 (L)    261 (H)       12/05/17 1347 143    4.1    110    26    5 (L)    0.51    7.7 (L)    132       12/05/17 0121 142    3.6    107    29    5 (L)    0.60    7.9 (L)    199 (H)             ASSESSMENT:  72 y.o. female 4 days post op s/p C2-T2 PSF w/  decompression C3-C7    PLAN:  - Weightbearing: WBAT  - PT/OT: recommend inpatient rehab  - DVT prophylaxis: per primary, on lovenox  - Pain: po, IV  - Dressing: plan to change 8/20.  - Foley: in place  - Labs: WBC not elevated  - Diet: per primary, has G-tube  - Follow-up: will coordinate follow up with  Dr. Herbie Saxon closer to discharge    Marlou Starks, MD  Resident, PGY-1  Department of Orthopaedics  Pager [phone]  12/06/2017 06:51

## 2017-12-06 NOTE — Care Plan (Signed)
Plan for the day is to change FVC to daily and Cough assist is now TID and alb neb tx will be PRN.  Pt suction for small to moderate amount of secretions with cough assist.  Vent changes were made to increase PS to 14 PEEP 10 and FIO2 remains at 40%.  Do not wean PEEP and only wean PS if pt tidal volumes increase and we can maintain tidal volumes around 450 cc with less PS.  FIO2 can be weaned if SAT warrant.

## 2017-12-06 NOTE — Respiratory Therapy (Signed)
Care Plan:    Pt remains on vent. Vent settings listed below. Pt refused cough assist overnight due to pain from new trach. Pt vent weaned from SIMV overnight. Pt did desat this morning following vent change and a LRM was done via the vent. Pt sats improved. Instructed pt on the importance of doing the cough assist today now that the pain is better under control. Pt agreed to try. Continue nebs. No other issues throughout the night. Will continue to monitor and wean as tolerated.       VENTILATOR - CPAP(PS) / SPONTANEOUS CONTINUOUS Discontinue   Duration: Until Specified    Priority: Routine       Question Answer Comment   FIO2 (%) 40    Peep(cm/H2O) 5    Pressure Support(cm/H2O) 12    Indications IMPROVE DISTRIBUTION OF VENTILATION    Invasive/Non-Invasive INVASIVE

## 2017-12-06 NOTE — Care Plan (Signed)
[facility]  Rehabilitation Services  Physical Therapy Progress Note      Patient Name: Christy Turner  Date of Birth: [birth year]  Height:  163.8 cm (5' 4.5")  Weight:  90.4 kg (199 lb 4.7 oz)  Room/Bed: 11/A  Payor: OTHER AUTO / Plan: OTHER AUTO / Product Type: Auto /     Assessment:     Patient would like to get function back in her extremities but currently cannot move her LEs. She tolerated PROM well but was unable to feel any sensation below her waist.     Discharge Needs:   Equipment Recommendation: TBD    Discharge Disposition: inpatient rehabilitation facility    JUSTIFICATION OF DISCHARGE RECOMMENDATION   Based on current diagnosis, functional performance prior to admission, and current functional performance, this patient requires continued PT services in inpatient rehabilitation facility in order to achieve significant functional improvements in these deficit areas: gait, locomotion, and balance, muscle performance, neuromuscular.      Plan:   Continue to follow patient according to established plan of care.  The risks/benefits of therapy have been discussed with the patient/caregiver and he/she is in agreement with the established plan of care.     Subjective & Objective:        12/06/17 0945   Rehab Session   Document Type therapy progress note (daily note)   Total PT Minutes: 15   Patient Effort good   Symptoms Noted During/After Treatment fatigue   General Information   Patient Profile Reviewed? yes   Medical Lines Arterial Line;Telemetry;PIV Line;Central Line;Foley Catheter   Respiratory Status ventilator   Existing Precautions/Restrictions fall precautions;full code;spinal precautions   Mutuality/Individual Preferences   Individualized Care Needs OOB with maximove   Pre Treatment Status   Pre Treatment Patient Status Patient supine in bed;Call light within reach;Telephone within reach;Nurse approved session   Support Present Pre Treatment  Family present   Communication Pre Treatment   Nurse   Communication Pre Treatment Comment Nurse requested no OOB activity due to just getting paitient back into bed   Pain Assessment   Pre/Post Treatment Pain Comment did not report pain   Therapeutic Exercise/Activity   Comment PROM B LE: ankles, LE digits, knee and hip.         Orthotics/Misc Device    Cervical Collar On and Aligned   Boots On   Post Treatment Status   Post Treatment Patient Status Patient supine in bed;Call light within reach;Telephone within reach   Support Present Post Treatment  Family present   Physical Therapy Clinical Impression   Assessment Patient would like to get function back in her extremities but currently cannot move her LEs. She tolerated PROM well but was unable to feel any sensation below her waist.    Anticipated Equipment Needs at Discharge (PT) TBD   Anticipated Discharge Disposition inpatient rehabilitation facility       Therapist:   Kirtland Bouchard, PTA   Pager #: [phone]
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
## 2017-12-06 NOTE — Care Plan (Signed)
[facility]  Rehabilitation Services  Occupational Therapy Progress Note    Patient Name: Christy Turner  Date of Birth: 1945/11/01  Height:  163.8 cm (5' 4.5")  Weight:  90.4 kg (199 lb 4.7 oz)  Room/Bed: 11/A  Payor: OTHER AUTO / Plan: OTHER AUTO / Product Type: Auto /     Assessment:    Pt with good tolerance and participation for PROM and AAROM exercises this session as well as tolerated B UE positioning using standard pillows and towel rools while in bed. At this time it seems that pt would benefit from continued care from an IRF level of care for improving self care abiliites.      Discharge Needs:   Equipment Recommendation: to be determined    The patient presents with mobility limitations due to impaired range of motion, impaired strength and impaired functional activity tolerance that significantly impair/prevent patient's ability to participate in mobility-related activities of daily living (MRADLs) including  ambulation and transfers in order to safely complete, toileting, bathing, safely entering/exiting the home. This functional mobility deficit can be sufficiently resolved with the use of a to be determined   in order to decrease the risk of falls, morbidity, and mortality in performance of these MRADLs.  Patient is able to safely use this assistive device.    Discharge Disposition:  inpatient rehabilitation facility     JUSTIFICATION OF DISCHARGE RECOMMENDATION   Based on current diagnosis, functional performance prior to admission, and current functional performance, this patient requires continued OT services in inpatient rehabilitation facility in order to achieve significant functional improvements.    Plan:   Continue to follow patient according to established plan of care.  The risks/benefits of therapy have been discussed with the patient/caregiver and he/she is in agreement with the established plan of care.     Subjective & Objective:        12/06/17 0946   Rehab Session      Document Type therapy progress note (daily note)   Total OT Minutes: 19   Patient Effort good   Symptoms Noted During/After Treatment fatigue   Symptoms Noted Comment with active movement with B UE's   General Information   Patient Profile Reviewed? yes   Pre Treatment Status   Pre Treatment Patient Status Patient supine in bed;Call light within reach;Telephone within reach   Support Present Pre Treatment  Nurse present;Family present   Communication Pre Treatment  Nurse   Mutuality/Individual Preferences   Individualized Care Needs OOB with maxi move Ax3   Pain Assessment   Pre/Post Treatment Pain Comment zero pain stated or observed this session   Coping/Psychosocial Response Interventions   Plan Of Care Reviewed With patient;family   Cognitive Assessment/Interventions   Behavior/Mood Observations behavior appropriate to situation, WNL/WFL;alert;cooperative;flat affect   Therapeutic Exercise/Activity   Comment tolerated B UE PROM and AAROM exercises   Post Treatment Status   Post Treatment Patient Status Patient supine in bed;Call light within reach;Telephone within reach   Support Present Post Treatment  Family present;Nurse present   Communication Post Treatement Nurse   Occupational Therapy Clinical Impression   Functional Level at Time of Session Pt with good tolerance and participation for PROM and AAROM exercises this session as well as tolerated B UE positioning using standard pillows and towel rools while in bed. At this time it seems that pt would benefit from continued care from an IRF level of care for improving self care abiliites.   Anticipated Equipment Needs at  Discharge to be determined   Anticipated Discharge Disposition inpatient rehabilitation facility       Therapist:   Novella Olive, Denman George  Pager #: [phone]

## 2017-12-06 NOTE — Nurses Notes (Signed)
Pt temp 39.1 SICU notified. Tylenol and ice packs given, will continue to monitor.

## 2017-12-07 ENCOUNTER — Inpatient Hospital Stay (HOSPITAL_COMMUNITY): Payer: Medicare HMO

## 2017-12-07 DIAGNOSIS — H532 Diplopia: Secondary | ICD-10-CM

## 2017-12-07 DIAGNOSIS — Z930 Tracheostomy status: Secondary | ICD-10-CM

## 2017-12-07 DIAGNOSIS — J984 Other disorders of lung: Secondary | ICD-10-CM

## 2017-12-07 DIAGNOSIS — A419 Sepsis, unspecified organism: Secondary | ICD-10-CM

## 2017-12-07 DIAGNOSIS — R6521 Severe sepsis with septic shock: Secondary | ICD-10-CM

## 2017-12-07 LAB — CBC
HCT: 25.6 % — ABNORMAL LOW (ref 34.8–46.0)
HGB: 8 g/dL — ABNORMAL LOW (ref 11.5–16.0)
MCH: 28.4 pg (ref 26.0–32.0)
MCH: 28.4 pg (ref 26.0–32.0)
MCHC: 31.3 g/dL (ref 31.0–35.5)
MCV: 90.8 fL (ref 78.0–100.0)
MPV: 11 fL (ref 8.7–12.5)
PLATELETS: 153 x10ˆ3/uL (ref 150–400)
RBC: 2.82 x10ˆ6/uL — ABNORMAL LOW (ref 3.85–5.22)
RDW-CV: 15.8 % — ABNORMAL HIGH (ref 11.5–15.5)
WBC: 6.4 x10ˆ3/uL (ref 3.7–11.0)

## 2017-12-07 LAB — MAGNESIUM: MAGNESIUM: 2.1 mg/dL (ref 1.6–2.6)

## 2017-12-07 LAB — PHOSPHORUS: PHOSPHORUS: 2.3 mg/dL (ref 2.3–4.0)

## 2017-12-07 LAB — POC BLOOD GLUCOSE (RESULTS)
GLUCOSE, POC: 179 mg/dL — ABNORMAL HIGH (ref 70–105)
GLUCOSE, POC: 191 mg/dL — ABNORMAL HIGH (ref 70–105)
GLUCOSE, POC: 192 mg/dL — ABNORMAL HIGH (ref 70–105)
GLUCOSE, POC: 209 mg/dL — ABNORMAL HIGH (ref 70–105)
GLUCOSE, POC: 215 mg/dL — ABNORMAL HIGH (ref 70–105)

## 2017-12-07 LAB — BASIC METABOLIC PANEL
ANION GAP: 6 mmol/L (ref 4–13)
BUN/CREA RATIO: 21 (ref 6–22)
BUN: 15 mg/dL (ref 8–25)
CALCIUM: 7.9 mg/dL — ABNORMAL LOW (ref 8.5–10.2)
CHLORIDE: 107 mmol/L (ref 96–111)
CO2 TOTAL: 29 mmol/L (ref 22–32)
CREATININE: 0.72 mg/dL (ref 0.49–1.10)
ESTIMATED GFR: >59 mL/min/{1.73_m2} (ref >59)
GLUCOSE: 219 mg/dL — ABNORMAL HIGH (ref 65–139)
POTASSIUM: 4 mmol/L (ref 3.5–5.1)
SODIUM: 142 mmol/L (ref 136–145)

## 2017-12-07 LAB — PROCALCITONIN: PROCALCITONIN: 0.46 ng/mL (ref <0.50)

## 2017-12-07 MED ORDER — ALBUTEROL SULFATE CONCENTRATE 2.5 MG/0.5 ML SOLUTION FOR NEBULIZATION
2.50 mg | INHALATION_SOLUTION | Freq: Three times a day (TID) | RESPIRATORY_TRACT | Status: DC
Start: 2017-12-07 — End: 2017-12-17
  Administered 2017-12-07 – 2017-12-17 (×31): 2.5 mg via RESPIRATORY_TRACT
  Filled 2017-12-07 (×9): qty 1
  Filled 2017-12-07: qty 2
  Filled 2017-12-07 (×18): qty 1

## 2017-12-07 MED ORDER — LEVOFLOXACIN 750 MG / 150 ML IVPB
750.0000 mg | INJECTION | INTRAVENOUS | Status: DC
Start: 2017-12-07 — End: 2017-12-08
  Administered 2017-12-07: 750 mg via INTRAVENOUS
  Administered 2017-12-07 – 2017-12-08 (×2): 0 mg via INTRAVENOUS
  Administered 2017-12-08: 02:00:00 750 mg via INTRAVENOUS
  Filled 2017-12-07 (×2): qty 150

## 2017-12-07 MED ORDER — INSULIN GLARGINE (U-100) 100 UNIT/ML SUBCUTANEOUS SOLUTION
10.0000 [IU] | Freq: Every day | SUBCUTANEOUS | Status: DC
Start: 2017-12-07 — End: 2017-12-08
  Administered 2017-12-07 – 2017-12-08 (×3): 10 [IU] via SUBCUTANEOUS
  Filled 2017-12-07 (×2): qty 10

## 2017-12-07 MED ORDER — MEROPENEM 1 GRAM INTRAVENOUS SOLUTION
1.0000 g | Freq: Once | INTRAVENOUS | Status: AC
Start: 2017-12-07 — End: 2017-12-07
  Administered 2017-12-07: 1 g via INTRAVENOUS
  Administered 2017-12-07: 0 g via INTRAVENOUS
  Filled 2017-12-07: qty 20

## 2017-12-07 MED ORDER — FOSFOMYCIN TROMETHAMINE 3 GRAM ORAL PACKET
3.0000 g | PACK | Freq: Once | ORAL | Status: DC
Start: 2017-12-07 — End: 2017-12-07
  Filled 2017-12-07: qty 1

## 2017-12-07 MED ORDER — SODIUM CHLORIDE 0.9 % INTRAVENOUS SOLUTION
500.0000 mg | Freq: Four times a day (QID) | INTRAVENOUS | Status: DC
Start: 2017-12-07 — End: 2017-12-09
  Administered 2017-12-07: 0 mg via INTRAVENOUS
  Administered 2017-12-07 (×2): 500 mg via INTRAVENOUS
  Administered 2017-12-08: 0 mg via INTRAVENOUS
  Administered 2017-12-08: 500 mg via INTRAVENOUS
  Administered 2017-12-08 (×2): 0 mg via INTRAVENOUS
  Administered 2017-12-08 (×2): 500 mg via INTRAVENOUS
  Administered 2017-12-08: 0 mg via INTRAVENOUS
  Administered 2017-12-08: 500 mg via INTRAVENOUS
  Administered 2017-12-09 (×2): 0 mg via INTRAVENOUS
  Administered 2017-12-09: 500 mg via INTRAVENOUS
  Filled 2017-12-07 (×9): qty 10

## 2017-12-07 MED ORDER — SODIUM CHLORIDE 3 % FOR NEBULIZATION
3.00 mL | INHALATION_SOLUTION | Freq: Three times a day (TID) | RESPIRATORY_TRACT | Status: DC
Start: 2017-12-07 — End: 2017-12-17
  Administered 2017-12-07 – 2017-12-08 (×4): 3 mL via RESPIRATORY_TRACT
  Administered 2017-12-08 (×2): 4 mL via RESPIRATORY_TRACT
  Administered 2017-12-09 (×3): 3 mL via RESPIRATORY_TRACT
  Administered 2017-12-10 (×2): 4 mL via RESPIRATORY_TRACT
  Administered 2017-12-10 – 2017-12-14 (×13): 3 mL via RESPIRATORY_TRACT
  Administered 2017-12-15: 4 mL via RESPIRATORY_TRACT
  Administered 2017-12-15 (×2): 3 mL via RESPIRATORY_TRACT
  Administered 2017-12-16 – 2017-12-17 (×4): 4 mL via RESPIRATORY_TRACT
  Filled 2017-12-07 (×3): qty 4
  Filled 2017-12-07: qty 8
  Filled 2017-12-07 (×3): qty 4
  Filled 2017-12-07: qty 8
  Filled 2017-12-07: qty 4
  Filled 2017-12-07: qty 8
  Filled 2017-12-07 (×18): qty 4

## 2017-12-07 MED ORDER — CIPROFLOXACIN 500 MG TABLET
500.0000 mg | ORAL_TABLET | Freq: Two times a day (BID) | ORAL | Status: DC
Start: 2017-12-07 — End: 2017-12-07

## 2017-12-07 MED ADMIN — amino ac-protein hydro-whey protein 10 gram-100 kcal/30 mL oral liquid: GASTROSTOMY | @ 08:00:00

## 2017-12-07 MED ADMIN — oxyCODONE 5 mg tablet: GASTROSTOMY | @ 20:00:00

## 2017-12-07 MED ADMIN — pseudoephedrine 60 mg tablet: GASTROSTOMY

## 2017-12-07 MED ADMIN — PEDS CUSTOM PARENTERAL NUTRITION - 6 MONTHS TO 11 YEARS OLD: RESPIRATORY_TRACT | @ 21:00:00

## 2017-12-07 MED ADMIN — norepinephrine bitartrate 16 mg/250 mL (64 mcg/mL) in 0.9 % NaCl IV: INTRAVENOUS | @ 20:00:00

## 2017-12-07 MED ADMIN — sodium chloride 0.9 % intravenous solution: GASTROSTOMY | @ 07:00:00 | NDC 00338004904

## 2017-12-07 MED ADMIN — bacitracin 500 unit/gram topical ointment: INTRAVENOUS | @ 03:00:00 | NDC 45802006001

## 2017-12-07 MED ADMIN — nystatin 100,000 unit/gram topical powder: RESPIRATORY_TRACT | @ 16:00:00 | NDC 00574200815

## 2017-12-07 MED ADMIN — ADULT CLINIMIX CUSTOM PARENTERAL NUTRITION: GASTROSTOMY | @ 09:00:00 | NDC 00338113703

## 2017-12-07 MED ADMIN — PEDS CUSTOM PARENTERAL NUTRITION - LESS THAN 6 MONTHS: GASTROSTOMY | @ 20:00:00 | NDC 00338113003

## 2017-12-07 MED ADMIN — lactated Ringers intravenous solution: ORAL | @ 09:00:00 | NDC 00338011704

## 2017-12-07 MED ADMIN — electrolyte-A intravenous solution: ORAL | @ 18:00:00 | NDC 00338022104

## 2017-12-07 NOTE — Care Plan (Signed)
Pt complained of some pain in her hands and arms along with pain in her back.  She also complained of numbness and tingling in her feet and lower legs.  Pt had spiked a fever overnight, ice packs were placed in the axilla, and groin, a cooling blanket was also placed on the pt. STAT drew 2 sets of blood cultures, Pt's foley was changed out.  We tried to decrease the levophed however once it came down to 0.08 her maps began to drop below 65. She only slept for about 2 hours.

## 2017-12-07 NOTE — Respiratory Therapy (Signed)
VENTILATOR - CPAP(PS) / SPONTANEOUS CONTINUOUS Discontinue   Duration: Until Specified    Priority: Routine       Question Answer Comment   FIO2 (%) 30    Peep(cm/H2O) 10    Pressure Support(cm/H2O) 14    Indications IMPROVE DISTRIBUTION OF VENTILATION    Invasive/Non-Invasive INVASIVE            Target Vt 450. Will wean as tolerated. Will continue with scheduled breathing treatments and cough assist. Will continue with daily parameters. Will change to SIMV at night to give sigh breath. Will continue to monitor.

## 2017-12-07 NOTE — Care Plan (Signed)
[facility]  Rehabilitation Services  Occupational Therapy Progress Note    Patient Name: Christy Turner  Date of Birth: [birth year]  Height:  163.8 cm (5' 4.5")  Weight:  97.7 kg (215 lb 6.2 oz)  Room/Bed: 11/A  Payor: OTHER AUTO / Plan: OTHER AUTO / Product Type: Auto /     Assessment:    Pt with good participatio but seemed a bit more withdrawn this session over yesterday for tolerating and participating in  B UE PROM/AAROM exercises with posiitoning at the end or session using standard pillows and towel rolls. At this time it seems that pt would benefit from continued care from an IRF level of care for maximizing self care abilities.       Discharge Needs:   Equipment Recommendation: to be determined    The patient presents with mobility limitations due to impaired range of motion, impaired strength and impaired functional activity tolerance that significantly impair/prevent patient's ability to participate in mobility-related activities of daily living (MRADLs) including  ambulation and transfers in order to safely complete, toileting, bathing, safely entering/exiting the home, in reasonable time. This functional mobility deficit can be sufficiently resolved with the use of a to be determined   in order to decrease the risk of falls, morbidity, and mortality in performance of these MRADLs.  Patient is able to safely use this assistive device.    Discharge Disposition:  inpatient rehabilitation facility     JUSTIFICATION OF DISCHARGE RECOMMENDATION   Based on current diagnosis, functional performance prior to admission, and current functional performance, this patient requires continued OT services in inpatient rehabilitation facility in order to achieve significant functional improvements.    Plan:   Continue to follow patient according to established plan of care.  The risks/benefits of therapy have been discussed with the patient/caregiver and he/she is in agreement with the established plan of  care.     Subjective & Objective:        12/07/17 1019   Rehab Session   Document Type therapy progress note (daily note)   Total OT Minutes: 35   Patient Effort good   Symptoms Noted During/After Treatment none   General Information   Patient Profile Reviewed? yes   Medical Lines Arterial Line;Telemetry;PIV Line;Central Line;Foley Catheter   Respiratory Status ventilator   Existing Precautions/Restrictions fall precautions;full code;spinal precautions   Pre Treatment Status   Pre Treatment Patient Status Patient supine in bed   Support Present Pre Treatment  Nurse present;Family present   Communication Pre Treatment  Nurse   Mutuality/Individual Preferences   Individualized Care Needs OOB with maxi move   Pain Assessment   Pre/Post Treatment Pain Comment zero pain stated when asked   Coping/Psychosocial Response Interventions   Plan Of Care Reviewed With patient;family   Cognitive Assessment/Interventions   Behavior/Mood Observations behavior appropriate to situation, WNL/WFL;flat affect;sad/withdrawn   Therapeutic Exercise/Activity   Comment B UE PROM/AAROM exercises with positioning using standard pillows and towel rolls   Post Treatment Status   Post Treatment Patient Status Patient supine in bed   Support Present Post Treatment  Nurse present;Family present   Film/video editor Nurse   Occupational Therapy Clinical Impression   Functional Level at Time of Session Pt with good participatio but seemed a bit more withdrawn this session over yesterday for tolerating and participating in  B UE PROM/AAROM exercises with posiitoning at the end or session using standard pillows and towel rolls. At this time it seems that pt would  benefit from continued care from an IRF level of care for maximizing self care abilities.    Anticipated Equipment Needs at Discharge to be determined   Anticipated Discharge Disposition inpatient rehabilitation facility       Therapist:   Novella Olive, Denman George  Pager #: [phone]
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
## 2017-12-07 NOTE — Care Plan (Signed)
[facility]  Rehabilitation Services  Physical Therapy Progress Note      Patient Name: Christy Turner  Date of Birth: 1945/10/28  Height:  163.8 cm (5' 4.5")  Weight:  97.7 kg (215 lb 6.2 oz)  Room/Bed: 11/A  Payor: OTHER AUTO / Plan: OTHER AUTO / Product Type: Auto /     Assessment:     The patient was noted to be more withdrawn this session. She tolerated PROM and stretching to BLE's in all directional planes. Continues to have no motor or sensory in BLE's. She required DEP Ax2 for rolling to left side for assist with nursing care. Will continue to work with patient. At this time the patient continues to be most appropriate for rehab placement.    Discharge Needs:   Equipment Recommendation: TBD    Discharge Disposition: inpatient rehabilitation facility    JUSTIFICATION OF DISCHARGE RECOMMENDATION   Based on current diagnosis, functional performance prior to admission, and current functional performance, this patient requires continued PT services in inpatient rehabilitation facility in order to achieve significant functional improvements in these deficit areas: gait, locomotion, and balance, muscle performance, neuromuscular.      Plan:   Continue to follow patient according to established plan of care.  The risks/benefits of therapy have been discussed with the patient/caregiver and he/she is in agreement with the established plan of care.     Subjective & Objective:        12/07/17 1020   Rehab Session   Document Type therapy progress note (daily note)   Total PT Minutes: 35   Patient Effort good   Symptoms Noted During/After Treatment none   General Information   Patient Profile Reviewed? yes   Medical Lines Arterial Line;Telemetry;PIV Line;Central Line;Foley Catheter   Respiratory Status ventilator   Existing Precautions/Restrictions fall precautions;full code;spinal precautions   Mutuality/Individual Preferences   Individualized Care Needs Encourage ROM to all extremities.   Pre Treatment  Status   Pre Treatment Patient Status Patient supine in bed;Call light within reach;Telephone within reach;Nurse approved session;Venodynes in place and activated   Support Present Pre Treatment  Family present;Nurse present   Communication Pre Treatment  Nurse   Cognitive Assessment/Interventions   Behavior/Mood Observations alert;flat affect;sad/withdrawn   Orientation Status unable/difficult to assess   Attention mild impairment   Vital Signs   O2 Delivery Pre Treatment supplemental O2   O2 Delivery Post Treatment supplemental O2   Vitals Comment VSS   Pain Assessment   Pre/Post Treatment Pain Comment Did not formally rate pain, no indication of pain.   Bed Mobility Assessment/Treatment   Comment Assisted with nursing care.   Roll Left Independence dependent (less than 25% patient effort);2 person assist required   Therapeutic Exercise/Activity   Comment PROM to BLE's in all directional planes.        Orthotics/Misc Device    Nurse, adult;On and Aligned   Boots Off   Post Treatment Status   Post Treatment Patient Status Patient supine in bed;Call light within reach;Telephone within reach;Venodynes in place and activated   Support Present Post Treatment  Family present;Nurse present   Communication Post Treatement Nurse   Plan of Care Review   Plan Of Care Reviewed With patient;family   Physical Therapy Clinical Impression   Assessment The patient was noted to be more withdrawn this session. She tolerated PROM and stretching to BLE's in all directional planes. Continues to have no motor or sensory in BLE's. She required DEP Ax2 for rolling  to left side for assist with nursing care. Will continue to work with patient. At this time the patient continues to be most appropriate for rehab placement.   Anticipated Equipment Needs at Discharge (PT) TBD   Anticipated Discharge Disposition inpatient rehabilitation facility       Therapist:   Teofilo Pod, [place]   Pager #: [phone]
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
## 2017-12-07 NOTE — Care Plan (Signed)
Patient continues to work towards d/c needs.  Plan of care reviewed with patient and family, verbalized and demonstrated understanding.  Education performed on acute pain, medications, and visitation policy. Fall, skin, aspiration, reflux, and C-spine precautions maintained.  Bronchial hygiene encouraged. PUP bundle maintained. Hourly safety checks performed per hospital policy.    Plan for today is to remove NG tube and start medications through the PEG. Also decreasing the levophed to maintain MAPs above 65.     Problem: Communication Impairment (Mechanical Ventilation, Invasive)  Goal: Effective Communication  Outcome: Ongoing (see interventions/notes)     Problem: Device-Related Complication Risk (Mechanical Ventilation, Invasive)  Goal: Optimal Device Function  Outcome: Ongoing (see interventions/notes)     Problem: Inability to Wean (Mechanical Ventilation, Invasive)  Goal: Mechanical Ventilation Liberation  Outcome: Ongoing (see interventions/notes)     Problem: Nutrition Impairment (Mechanical Ventilation, Invasive)  Goal: Optimal Nutrition Delivery  Outcome: Ongoing (see interventions/notes)     Problem: Skin and Tissue Injury (Mechanical Ventilation, Invasive)  Goal: Absence of Device-Related Skin and Tissue Injury  Outcome: Ongoing (see interventions/notes)     Problem: Ventilator-Induced Lung Injury (Mechanical Ventilation, Invasive)  Goal: Absence of Ventilator-Induced Lung Injury  Outcome: Ongoing (see interventions/notes)     Problem: Skin Injury Risk Increased  Goal: Skin Health and Integrity  Outcome: Ongoing (see interventions/notes)     Problem: Adult Inpatient Plan of Care  Goal: Plan of Care Review  Outcome: Ongoing (see interventions/notes)  Goal: Patient-Specific Goal (Individualization)  Outcome: Ongoing (see interventions/notes)  Goal: Absence of Hospital-Acquired Illness or Injury  Outcome: Ongoing (see interventions/notes)  Goal: Optimal Comfort and Wellbeing  Outcome: Ongoing (see  interventions/notes)  Goal: Rounds/Family Conference  Outcome: Ongoing (see interventions/notes)     Problem: Fall Injury Risk  Goal: Absence of Fall and Fall-Related Injury  Outcome: Ongoing (see interventions/notes)     Problem: Acute Rehab Services Goal & Intervention Plan  Goal: Bathing Goal  Description  Stand Alone Therapy Goal  Outcome: Ongoing (see interventions/notes)  Goal: Bed Mobility Goal  Description  Stand Alone Therapy Goal  Outcome: Ongoing (see interventions/notes)  Goal: Caregiver Training Goal  Description  Stand Alone Therapy Goal  Outcome: Ongoing (see interventions/notes)  Goal: Cognition Goal  Description  Stand Alone Therapy Goal  Outcome: Ongoing (see interventions/notes)  Goal: Cognition Goals, SLP  Description  Stand Alone Therapy Goal  Outcome: Ongoing (see interventions/notes)  Goal: Communication Goals, SLP  Description  Stand Alone Therapy Goal  Outcome: Ongoing (see interventions/notes)  Goal: Dysphagia Goals, SLP  Description  Stand Alone Therapy Goal  Outcome: Ongoing (see interventions/notes)  Goal: Eating Self-Feeding Goal  Description  Stand Alone Therapy Goal  Outcome: Ongoing (see interventions/notes)  Goal: Gait Training Goal  Description  Stand Alone Therapy Goal  Outcome: Ongoing (see interventions/notes)  Goal: Grooming Goal  Description  Stand Alone Therapy Goal  Outcome: Ongoing (see interventions/notes)  Goal: Home Management Goal  Description  Stand Alone Therapy Goal  Outcome: Ongoing (see interventions/notes)  Goal: Interprofessional Goal  Description  Stand Alone Therapy Goal  Outcome: Ongoing (see interventions/notes)  Goal: LB Dressing Goal  Description  Stand Alone Therapy Goal  Outcome: Ongoing (see interventions/notes)  Goal: Occupational Therapy Goals  Description  Stand Alone Therapy Goal  Outcome: Ongoing (see interventions/notes)  Goal: Physical Therapy Goal  Description  Stand Alone Therapy Goal  Outcome: Ongoing (see interventions/notes)  Goal: Range of  Motion Goal  Description  Stand Alone Therapy Goal  Outcome: Ongoing (see interventions/notes)  Goal: Strength Goal  Description  Stand Alone Therapy Goal  Outcome: Ongoing (see interventions/notes)  Goal: Toileting Goal  Description  Stand Alone Therapy Goal  Outcome: Ongoing (see interventions/notes)  Goal: Goal Transfer Training  Description  Stand Alone Therapy Goal  Outcome: Ongoing (see interventions/notes)  Goal: UB Dressing Goal  Description  Stand Alone Therapy Goal  Outcome: Ongoing (see interventions/notes)     Problem: Acute Rehab Services Goal & Intervention Plan  Goal: Bathing Goal  Description  Stand Alone Therapy Goal  Outcome: Ongoing (see interventions/notes)  Goal: Bed Mobility Goal  Description  Stand Alone Therapy Goal  Outcome: Ongoing (see interventions/notes)  Goal: Caregiver Training Goal  Description  Stand Alone Therapy Goal  Outcome: Ongoing (see interventions/notes)  Goal: Cognition Goal  Description  Stand Alone Therapy Goal  Outcome: Ongoing (see interventions/notes)  Goal: Cognition Goals, SLP  Description  Stand Alone Therapy Goal  Outcome: Ongoing (see interventions/notes)  Goal: Communication Goals, SLP  Description  Stand Alone Therapy Goal  Outcome: Ongoing (see interventions/notes)  Goal: Dysphagia Goals, SLP  Description  Stand Alone Therapy Goal  Outcome: Ongoing (see interventions/notes)  Goal: Eating Self-Feeding Goal  Description  Stand Alone Therapy Goal  Outcome: Ongoing (see interventions/notes)  Goal: Gait Training Goal  Description  Stand Alone Therapy Goal  Outcome: Ongoing (see interventions/notes)  Goal: Grooming Goal  Description  Stand Alone Therapy Goal  Outcome: Ongoing (see interventions/notes)  Goal: Home Management Goal  Description  Stand Alone Therapy Goal  Outcome: Ongoing (see interventions/notes)  Goal: Interprofessional Goal  Description  Stand Alone Therapy Goal  Outcome: Ongoing (see interventions/notes)  Goal: LB Dressing Goal  Description  Stand  Alone Therapy Goal  Outcome: Ongoing (see interventions/notes)  Goal: Occupational Therapy Goals  Description  Stand Alone Therapy Goal  Outcome: Ongoing (see interventions/notes)  Goal: Physical Therapy Goal  Description  Stand Alone Therapy Goal  Outcome: Ongoing (see interventions/notes)  Goal: Range of Motion Goal  Description  Stand Alone Therapy Goal  Outcome: Ongoing (see interventions/notes)  Goal: Strength Goal  Description  Stand Alone Therapy Goal  Outcome: Ongoing (see interventions/notes)  Goal: Toileting Goal  Description  Stand Alone Therapy Goal  Outcome: Ongoing (see interventions/notes)  Goal: Goal Transfer Training  Description  Stand Alone Therapy Goal  Outcome: Ongoing (see interventions/notes)  Goal: UB Dressing Goal  Description  Stand Alone Therapy Goal  Outcome: Ongoing (see interventions/notes)

## 2017-12-07 NOTE — Nurses Notes (Signed)
Patient complaining of double vision when looking straight ahead. Notified SICU Dr. Dena Billet. Will continue to monitor.
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
## 2017-12-07 NOTE — Respiratory Therapy (Signed)
12/07/17 0646        Respiratory Parameters   Start Time 0641   1st Attempt 0.45 Liters   2nd Attempt 0.55 Liters   3rd Attempt 0.55 Liters   Average FVC 0.5 Liters   1st Attempt -35 cmH2O   2nd Attempt -30 cmH2O   3rd Attempt -35 cmH2O   Average NIF -33 cmH2O   Respiratory Effort Good   $Parameters (Resp only) Completed   Stop Time 0647   Duration 6 Minutes

## 2017-12-08 ENCOUNTER — Inpatient Hospital Stay (HOSPITAL_COMMUNITY): Payer: Medicare HMO

## 2017-12-08 DIAGNOSIS — Z4682 Encounter for fitting and adjustment of non-vascular catheter: Secondary | ICD-10-CM

## 2017-12-08 DIAGNOSIS — G8253 Quadriplegia, C5-C7 complete: Secondary | ICD-10-CM

## 2017-12-08 DIAGNOSIS — Z981 Arthrodesis status: Secondary | ICD-10-CM

## 2017-12-08 DIAGNOSIS — J9811 Atelectasis: Secondary | ICD-10-CM

## 2017-12-08 LAB — CBC
HCT: 24.8 % — ABNORMAL LOW (ref 34.8–46.0)
HGB: 7.7 g/dL — ABNORMAL LOW (ref 11.5–16.0)
MCH: 27.9 pg (ref 26.0–32.0)
MCHC: 31 g/dL (ref 31.0–35.5)
MCV: 89.9 fL (ref 78.0–100.0)
MPV: 11.4 fL (ref 8.7–12.5)
PLATELETS: 164 x10ˆ3/uL (ref 150–400)
RBC: 2.76 x10ˆ6/uL — ABNORMAL LOW (ref 3.85–5.22)
RDW-CV: 15.4 % (ref 11.5–15.5)
WBC: 6.6 x10ˆ3/uL (ref 3.7–11.0)

## 2017-12-08 LAB — BASIC METABOLIC PANEL
ANION GAP: 5 mmol/L (ref 4–13)
ANION GAP: 5 mmol/L (ref 4–13)
BUN/CREA RATIO: 30 — ABNORMAL HIGH (ref 6–22)
BUN: 18 mg/dL (ref 8–25)
CALCIUM: 8 mg/dL — ABNORMAL LOW (ref 8.5–10.2)
CHLORIDE: 105 mmol/L (ref 96–111)
CO2 TOTAL: 31 mmol/L (ref 22–32)
CREATININE: 0.6 mg/dL (ref 0.49–1.10)
ESTIMATED GFR: >59 mL/min/1.73mˆ2 (ref >59)
GLUCOSE: 200 mg/dL — ABNORMAL HIGH (ref 65–139)
POTASSIUM: 3.7 mmol/L (ref 3.5–5.1)
SODIUM: 141 mmol/L (ref 136–145)

## 2017-12-08 LAB — PHOSPHORUS
PHOSPHORUS: 2.2 mg/dL — ABNORMAL LOW (ref 2.3–4.0)
PHOSPHORUS: 2.2 mg/dL — ABNORMAL LOW (ref 2.3–4.0)

## 2017-12-08 LAB — ALBUMIN: ALBUMIN: 1.8 g/dL — ABNORMAL LOW (ref 3.4–4.8)

## 2017-12-08 LAB — POC BLOOD GLUCOSE (RESULTS)
GLUCOSE, POC: 221 mg/dL — ABNORMAL HIGH (ref 70–105)
GLUCOSE, POC: 206 mg/dL — ABNORMAL HIGH (ref 70–105)
GLUCOSE, POC: 162 mg/dL — ABNORMAL HIGH (ref 70–105)

## 2017-12-08 LAB — MAGNESIUM: MAGNESIUM: 2.3 mg/dL (ref 1.6–2.6)

## 2017-12-08 MED ORDER — ELECTROLYTE-A INTRAVENOUS SOLUTION
INTRAVENOUS | Status: DC
Start: 2017-12-08 — End: 2017-12-13

## 2017-12-08 MED ORDER — POTASSIUM, SODIUM PHOSPHATES 280 MG-160 MG-250 MG ORAL POWDER PACKET
1.0000 | Freq: Once | ORAL | Status: AC
Start: 2017-12-08 — End: 2017-12-08
  Administered 2017-12-08: 1 via GASTROSTOMY
  Filled 2017-12-08: qty 1

## 2017-12-08 MED ORDER — INSULIN REGULAR HUMAN 100 UNIT/ML INJECTION - FOR TUBE FEED
3.00 [IU] | Freq: Every day | INTRAMUSCULAR | Status: DC
Start: 2017-12-08 — End: 2017-12-11
  Administered 2017-12-08: 3 [IU] via INTRAVENOUS
  Administered 2017-12-08: 0 [IU] via INTRAVENOUS
  Administered 2017-12-08 – 2017-12-10 (×9): 3 [IU] via INTRAVENOUS
  Administered 2017-12-10: 0 [IU] via INTRAVENOUS
  Administered 2017-12-10 – 2017-12-11 (×2): 3 [IU] via INTRAVENOUS
  Filled 2017-12-08: qty 3

## 2017-12-08 MED ORDER — ELECTROLYTE-A INTRAVENOUS SOLUTION BOLUS
500.0000 mL | Freq: Once | INTRAVENOUS | Status: AC
Start: 2017-12-08 — End: 2017-12-08
  Administered 2017-12-08: 500 mL via INTRAVENOUS
  Administered 2017-12-08: 0 mL via INTRAVENOUS

## 2017-12-08 MED ORDER — INSULIN GLARGINE (U-100) 100 UNIT/ML SUBCUTANEOUS SOLUTION
15.0000 [IU] | Freq: Every day | SUBCUTANEOUS | Status: DC
Start: 2017-12-09 — End: 2017-12-11
  Administered 2017-12-09 – 2017-12-11 (×3): 15 [IU] via SUBCUTANEOUS
  Filled 2017-12-08 (×3): qty 15

## 2017-12-08 MED ORDER — PSEUDOEPHEDRINE 30 MG TABLET
75.00 mg | ORAL_TABLET | Freq: Four times a day (QID) | ORAL | Status: DC
Start: 2017-12-08 — End: 2017-12-09
  Administered 2017-12-08 – 2017-12-09 (×3): 75 mg via GASTROSTOMY
  Filled 2017-12-08 (×4): qty 1

## 2017-12-08 MED ORDER — INSULIN REGULAR HUMAN 100 UNIT/ML INJECTION SSIP
3.00 [IU] | INJECTION | Freq: Every day | SUBCUTANEOUS | Status: DC | PRN
Start: 2017-12-08 — End: 2017-12-15
  Administered 2017-12-08 – 2017-12-09 (×2): 3 [IU] via SUBCUTANEOUS
  Filled 2017-12-08: qty 3

## 2017-12-08 MED ORDER — CLINDAMYCIN 600 MG/50 ML IN 5 % DEXTROSE INTRAVENOUS PIGGYBACK
600.0000 mg | INJECTION | Freq: Once | INTRAVENOUS | Status: DC
Start: 2017-12-12 — End: 2017-12-11

## 2017-12-08 MED ORDER — ELECTROLYTE-A INTRAVENOUS SOLUTION BOLUS
500.0000 mL | Freq: Once | INTRAVENOUS | Status: AC
Start: 2017-12-08 — End: 2017-12-08
  Administered 2017-12-08 (×2): 0 mL via INTRAVENOUS
  Administered 2017-12-08: 500 mL via INTRAVENOUS

## 2017-12-08 MED ADMIN — lanolin topical cream: SUBCUTANEOUS | @ 17:00:00 | NDC 4467710202

## 2017-12-08 MED ADMIN — insulin regular human 100 unit/mL injection solution: INTRAVENOUS | @ 11:00:00

## 2017-12-08 MED ADMIN — electrolyte-A intravenous solution: INTRAVENOUS | @ 17:00:00

## 2017-12-08 MED ADMIN — bisacodyL 10 mg rectal suppository: RECTAL | @ 09:00:00

## 2017-12-08 MED ADMIN — potassium, sodium phosphates 280 mg-160 mg-250 mg oral powder packet: GASTROSTOMY | @ 18:00:00

## 2017-12-08 MED ADMIN — famotidine 40 mg/5 mL (8 mg/mL) oral suspension: GASTROSTOMY | @ 20:00:00

## 2017-12-08 MED ADMIN — fentaNYL 75 mcg/hr transdermal patch: SUBCUTANEOUS | @ 06:00:00

## 2017-12-08 MED ADMIN — sodium chloride 0.9 % (flush) injection syringe: GASTROSTOMY | @ 18:00:00

## 2017-12-08 MED ADMIN — DEXTROSE 5% NORMAL SALINE W/ ADDITIVES: INTRAVENOUS | @ 21:00:00

## 2017-12-08 MED ADMIN — sodium chloride 0.9 % intravenous solution: GASTROSTOMY | @ 09:00:00 | NDC 00338004904

## 2017-12-08 MED ADMIN — ketorolac 30 mg/mL (1 mL) injection solution: GASTROSTOMY | @ 09:00:00

## 2017-12-08 NOTE — Care Plan (Signed)
Pt complained of back pain along with numbness and tingling in her hands and feet, pain medication as per MAR.  Weaned levo as tolerated,  family stayed with the pt. throughout the night.

## 2017-12-08 NOTE — Care Plan (Signed)
[facility]  Rehabilitation Services  Occupational Therapy Progress Note    Patient Name: Christy Turner  Date of Birth: [birth year]  Height:  163.8 cm (5' 4.5")  Weight:  94.4 kg (208 lb 1.8 oz)  Room/Bed: 11/A  Payor: OTHER AUTO / Plan: OTHER AUTO / Product Type: Auto /     Assessment:    Pt tolerated OT treatment fairly this date, remaining supine throughout.  She presents with upper extremity function to C5-C6 level, with increased fatigue with supine ROM.  Pt would require total assist with ADLs and bed mobility, transfers, mobility at this time d/t impaired functional use BUE, no active movement/sensation BLE, impaired activity tolerance limiting functional ability at this time. Recommend continued skilled OT in SCI inpatient rehab facility when medically stable for d/c to increase safety and (I) and reduce caregiver burden.      Discharge Needs:   Equipment Recommendation: to be determined    Discharge Disposition:  inpatient rehabilitation facility   The above recommendation is based upon the current examination and evaluation performed on this date. As subsequent sessions are completed, recommendations will be updated accordingly.  JUSTIFICATION OF DISCHARGE RECOMMENDATION   Based on current diagnosis, functional performance prior to admission, and current functional performance, this patient requires continued OT services in inpatient rehabilitation facility in order to achieve significant functional improvements.    Plan:   Continue to follow patient according to established plan of care.  The risks/benefits of therapy have been discussed with the patient/caregiver and he/she is in agreement with the established plan of care.     Subjective & Objective:        12/08/17 1201   Therapist Pager   OT Assigned/ Pager # Izaya Netherton [phone]   Rehab Session   Document Type therapy progress note (daily note)   Total OT Minutes: 19   Patient Effort good   Symptoms Noted During/After Treatment fatigue      Symptoms Noted Comment pt with increased fatigue, falling asleep at end of treatment   General Information   Patient Profile Reviewed? yes   Medical Lines Arterial Line;Foley Catheter;PIV Line;Telemetry;Central Line   Respiratory Status aerosol trach collar  (RT present to return pt to vent during treatment)   Existing Precautions/Restrictions fall precautions;full code   Pre Treatment Status   Pre Treatment Patient Status Patient supine in bed;Call light within reach;Telephone within reach   Support Present Pre Treatment  Family present  (daughter and son)   Communication Pre Treatment  Nurse   Communication Pre Treatment Comment RN approving OT treatment, completed with professional assist of PT   Mutuality/Individual Preferences   Individualized Care Needs OOB via maximove, ROM with routine self care   Vital Signs   Vitals Comment vss, pt initially on ATC, however RT present to return to vent during treatment   Pain Assessment   Pre/Post Treatment Pain Comment denied pain   Coping/Psychosocial   Observed Emotional State calm;cooperative   Plan of Care Reviewed With patient;family   Cognitive Assessment/Interventions   Behavior/Mood Observations alert;cooperative;flat affect   Orientation Status oriented to;person;situation   Attention WNL/WFL   Follows Commands WFL   Comment pt following all commands as able.  Pt disoriented to time, re-oriented by therapist    RUE Assessment   RUE Strength shoulder elevation 3+/5, shoulder abducation/adduction 2-/5, horizontal abduction/adduction 2-/5, shoulder flexion 1/5, elbow flexion 2+/5, elbow extension 1/5, wrist extension 1/5, wrist flexion 0/5, supination/pronation 0/5, no active digit movement   LUE Assessment  LUE Strength shoulder elevation 3+/5, shoulder abducation/adduction 2-/5, horizontal abduction/adduction 2-/5, shoulder flexion 1/5, elbow flexion 2+/5, elbow extension 1/5, wrist extension 1/5, wrist flexion 0/5, supination/pronation 0/5, no active digit  movement   RLE Assessment   RLE Other no active movement or sensation noted   LLE Assessment   LLE Other no active movement or sensation noted   Mobility Assessment/Training   Additional Documentation Bed Mobility Assessment/Treatment (Group)   Mobility Comment RN requesting pt remaining supine d/t BP   Bed Mobility Assessment/Treatment   Impairments ROM decreased;sensation decreased;strength decreased;coordination impaired   Comment pt required DEP Ax2 for positioning and rolling    Roll Left Independence dependent (less than 25% patient effort);2 person assist required   Roll Right Independence dependent (less than 25% patient effort);2 person assist required   ADL Assessment/Intervention   ADL Comments pt requiring total assist with all ADLs at this time d/t impaired funtional use of BUE and fatigue, falling asleep following ROM   Balance Skill Training   Comment NT   Therapeutic Exercise/Activity   Comment AAROM/PROM BUE in all planes as able, see UE assessment above.  Repositioning to R sidelying in supported position.        Orthotics/Misc Device    Cervical Collar On and Aligned;Aspen   Post Treatment Status   Post Treatment Patient Status Patient supine in bed;Call light within reach;Telephone within reach   Support Present Post Treatment  Nurse present   Occupational Therapy Clinical Impression   Functional Level at Time of Session Pt tolerated OT treatment fairly this date, remaining supine throughout.  She presents with upper extremity function to C5-C6 level, with increased fatigue with supine ROM.  Pt would require total assist with ADLs and bed mobility, transfers, mobility at this time d/t impaired functional use BUE, no active movement/sensation BLE, impaired activity tolerance limiting functional ability at this time. Recommend continued skilled OT in SCI inpatient rehab facility when medically stable for d/c to increase safety and (I) and reduce caregiver burden.   Anticipated Equipment Needs at  Discharge to be determined   Anticipated Discharge Disposition inpatient rehabilitation facility       Therapist:   Shari Heritage, OT  Pager #: [phone]

## 2017-12-08 NOTE — Nurses Notes (Signed)
12/08/17 0345   Vital Signs   Temperature (!) 38.7 C (101.7 F)     Dr. Cyndie Chime notified.  States to apply ice pack at this time.  Tylenol not due until 0600.

## 2017-12-08 NOTE — Respiratory Therapy (Signed)
Care Plan:    Pt remains on vent. Vent settings listed below. Pt vent settings changed last night for sigh breath trial. Pt has tolerated well. Also did neb treatment and cough assist with pt and she tolerated that well. FVC and NIF this morning. Continue nebs and cough assist as scheduled. Will continue to monitor pt and wean as tolerated.    VENTILATOR - SIMV(PRVC)PS / APV-SIMV QHS (2200) DiscontinueReschedule   Comments: For sigh breath per spine guidelines   Duration: Until Specified    Priority: Routine       Question Answer Comment   FIO2 (%) 30    Peep(cm/H2O) 10    Rate(bpm) 4    Tidal Volume(mls) 700    Pressure Support(cm/H2O) 14    Indications IMPROVE DISTRIBUTION OF VENTILATION

## 2017-12-08 NOTE — Nurses Notes (Signed)
12/08/17 2100   Urine Assessment   Bladder Scan Performed Y   Estimated Bladder Volume 309 mL     Foley removed on previous shift approximately around 1400. Patient was bladder scanned when back to bed and only approximately 309cc of urine was in the bladder. Will continue to monitor every few hours until patient reaches ~500 cc in the bladder.

## 2017-12-08 NOTE — Care Plan (Signed)
[facility]  Rehabilitation Services  Physical Therapy Progress Note      Patient Name: Christy Turner  Date of Birth: [birth year]  Height:  163.8 cm (5' 4.5")  Weight:  94.4 kg (208 lb 1.8 oz)  Room/Bed: 11/A  Payor: OTHER AUTO / Plan: OTHER AUTO / Product Type: Auto /     Assessment:     Patient tolerated PROM to bilateral LE's. PRAFO's applied to ankles.     Discharge Needs:   Equipment Recommendation: TBD      Discharge Disposition: inpatient rehabilitation facility    JUSTIFICATION OF DISCHARGE RECOMMENDATION   Based on current diagnosis, functional performance prior to admission, and current functional performance, this patient requires continued PT services in inpatient rehabilitation facility in order to achieve significant functional improvements in these deficit areas: gait, locomotion, and balance, muscle performance, neuromuscular.      Plan:   Continue to follow patient according to established plan of care.  The risks/benefits of therapy have been discussed with the patient/caregiver and he/she is in agreement with the established plan of care.     Subjective & Objective:        12/08/17 1159   Therapist Pager   PT Assigned/ Pager # steve [phone]   Rehab Session   Document Type therapy progress note (daily note)   Total PT Minutes: 19   Patient Effort good   Symptoms Noted During/After Treatment none   General Information   Patient Profile Reviewed? yes   Medical Lines Arterial Line;Foley Catheter;PIV Line;Telemetry;Central Line   Respiratory Status aerosol trach collar   Mutuality/Individual Preferences   Individualized Care Needs OOB with maxi move.   Pre Treatment Status   Pre Treatment Patient Status Patient supine in bed;Call light within reach;Telephone within reach;Nurse approved session   Support Present Pre Treatment  Family present   Communication Pre Treatment  Nurse   Pain Assessment   Pre/Post Treatment Pain Comment No C/O pain.   Therapeutic Exercise/Activity   Comment PROM to  bilateral LE's.   Post Treatment Status   Post Treatment Patient Status Patient supine in bed;Call light within reach;Telephone within reach   Support Present Post Treatment  Family present   Communication Post Treatement Nurse   Plan of Care Review   Plan Of Care Reviewed With patient;family   Physical Therapy Clinical Impression   Assessment Patient tolerated PROM to bilateral LE's. PRAFO's applied to ankles.    Anticipated Equipment Needs at Discharge (PT) TBD   Anticipated Discharge Disposition inpatient rehabilitation facility   Planned Therapy Interventions, PT Eval   Planned Therapy Interventions (PT) balance training;bed mobility training;transfer training       Therapist:   Berneice Gandy, PT   Pager #: [phone]

## 2017-12-08 NOTE — Care Plan (Addendum)
VENTILATOR - CPAP(PS) / SPONTANEOUS CONTINUOUS    Discontinue   Duration: Until Specified    Priority: Routine       Question Answer Comment   FIO2 (%) 30    Peep(cm/H2O) 10    Pressure Support(cm/H2O) 10            Pt was placed on above settings today. Average FVC .6 L and NIF -40  Continue with albuterol/ hypertonic tx tid. Pt tolerated ATC 40% well for 1 hr. Following spinal ATC protocol. Will rest for 8 hrs and repeat at 2000. Plan to place pt back below setting for QHS for sigh breaths per Dr. Terrilee Croak.     VENTILATOR - SIMV(PRVC)PS / APV-SIMV QHS (2200)    Discontinue  Reschedule   Comments: For sigh breath per spine guidelines   Duration: Until Specified    Priority: Routine       Question Answer Comment   FIO2 (%) 30    Peep(cm/H2O) 10    Rate(bpm) 12    Tidal Volume(mls) 700    Pressure Support(cm/H2O) 14            Will continue to monitor at this time.
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
## 2017-12-08 NOTE — Consults (Signed)
[place] Department of Orthopaedics  Spine Service  Attending: Shaquala Broeker  Progress Note  12/08/2017    Name: Christy Turner  DOB: 17-Apr-1946  MRN: [id number]    RECENT SURGERY:  12/02/2017 - C2-T2 PSF w/C3-C7 decompression     SUBJECTIVE:  72 y.o. female resting in bed. Moves mouth but voice is soft and hoarse and difficult to hear.  She has been spiking fevers and is now being treated for UTI with meropenem and levaquin. She has been up in a chair twice a day.    OBJECTIVE:  Febrile overnight to 100.9 and has been tachy to 128.  GEN - NAD, resting in bed, tracheostomy in place  Non distended abdomen      Upper Extremity Motor    Left  Right      D  4  4  C5    WE  2  2  C6    Tri  1  1  C7    FF  0  0  C8    HI  0  0  T1            Fingers held in flexion.    Lower Extremity Motor    Left  Right      HF  0  0  L2    Q  0  0  L3    TA  0  0  L4    EHL  0  0  L5    G/S  1 0  S1            Sensory    Left  Right    C5 2 2   C6 2 2   C7 2 2   C8 1 1   T1 0 0   L2 0 0   L3 0 0   L4 0 0   L5 0 0   S1 0 0               Pertinent Labs:  CBC  (Last 48 hours)    Date/Time WBC HGB HCT MCV PLATELETS    12/08/17 0029 6.6    7.7 (L)    24.8 (L)    89.9    164       12/07/17 0017 6.4    8.0 (L)    25.6 (L)    90.8    153           BMP  (Last 48 hours)    Date/Time Na K Cl CO2 BUN CREAT Calcium Glucose    12/08/17 0029 141    3.7    105    31    18    0.60    8.0 (L)    200 (H)       12/07/17 0017 142    4.0    107    29    15    0.72    7.9 (L)    219 (H)             ASSESSMENT:  72 y.o. female  op s/p C2-T2 PSF w/ decompression C3-C7    PLAN:  - Weightbearing: WBAT  - PT/OT: recommend inpatient rehab  - DVT prophylaxis: per primary, on lovenox  - Dressing: plan to change 8/20.  - Labs: WBC not elevated. Hb down to 7.7 from 8.0  - Follow-up: will coordinate follow up with Dr. Herbie Saxon closer to discharge    [place]  Minta Balsam, MD  Resident, PGY-1  Department of Orthopaedics  Pager [phone]  12/08/2017 09:40      I saw and examined the  patient.  I reviewed the resident's note.  I agree with the findings and plan of care as documented in the resident's note.  Any exceptions/additions are edited/noted.    Neta Ehlers, MD
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
## 2017-12-09 ENCOUNTER — Inpatient Hospital Stay (HOSPITAL_COMMUNITY): Payer: Medicare HMO

## 2017-12-09 DIAGNOSIS — J90 Pleural effusion, not elsewhere classified: Secondary | ICD-10-CM

## 2017-12-09 DIAGNOSIS — J9811 Atelectasis: Secondary | ICD-10-CM

## 2017-12-09 LAB — BASIC METABOLIC PANEL
ANION GAP: 7 mmol/L (ref 4–13)
BUN/CREA RATIO: 33 — ABNORMAL HIGH (ref 6–22)
BUN: 18 mg/dL (ref 8–25)
CALCIUM: 7.6 mg/dL — ABNORMAL LOW (ref 8.5–10.2)
CHLORIDE: 104 mmol/L (ref 96–111)
CO2 TOTAL: 28 mmol/L (ref 22–32)
CREATININE: 0.54 mg/dL (ref 0.49–1.10)
ESTIMATED GFR: >59 mL/min/{1.73_m2} (ref >59)
GLUCOSE: 121 mg/dL (ref 65–139)
POTASSIUM: 3.8 mmol/L (ref 3.5–5.1)
SODIUM: 139 mmol/L (ref 136–145)

## 2017-12-09 LAB — POC BLOOD GLUCOSE (RESULTS)
GLUCOSE, POC: 120 mg/dL — ABNORMAL HIGH (ref 70–105)
GLUCOSE, POC: 128 mg/dL — ABNORMAL HIGH (ref 70–105)
GLUCOSE, POC: 139 mg/dL — ABNORMAL HIGH (ref 70–105)
GLUCOSE, POC: 141 mg/dL — ABNORMAL HIGH (ref 70–105)
GLUCOSE, POC: 160 mg/dL — ABNORMAL HIGH (ref 70–105)

## 2017-12-09 LAB — MAGNESIUM: MAGNESIUM: 2.2 mg/dL (ref 1.6–2.6)

## 2017-12-09 LAB — H & H
HCT: 25 % — ABNORMAL LOW (ref 34.8–46.0)
HGB: 8 g/dL — ABNORMAL LOW (ref 11.5–16.0)
HGB: 8 g/dL — ABNORMAL LOW (ref 11.5–16.0)

## 2017-12-09 LAB — PHOSPHORUS: PHOSPHORUS: 2.4 mg/dL (ref 2.3–4.0)

## 2017-12-09 LAB — CBC
HCT: 22.1 % — ABNORMAL LOW (ref 34.8–46.0)
HGB: 7 g/dL — ABNORMAL LOW (ref 11.5–16.0)
MCH: 28.2 pg (ref 26.0–32.0)
MCHC: 31.7 g/dL (ref 31.0–35.5)
MCV: 89.1 fL (ref 78.0–100.0)
MPV: 11.3 fL (ref 8.7–12.5)
PLATELETS: 188 x10ˆ3/uL (ref 150–400)
RBC: 2.48 x10ˆ6/uL — ABNORMAL LOW (ref 3.85–5.22)
RDW-CV: 15.7 % — ABNORMAL HIGH (ref 11.5–15.5)
WBC: 5.5 x10ˆ3/uL (ref 3.7–11.0)

## 2017-12-09 LAB — URINE CULTURE: URINE CULTURE: >100000 — AB

## 2017-12-09 MED ORDER — SERTRALINE 50 MG TABLET
50.0000 mg | ORAL_TABLET | Freq: Every day | ORAL | Status: DC
Start: 2017-12-09 — End: 2017-12-09

## 2017-12-09 MED ORDER — PSEUDOEPHEDRINE 30 MG TABLET
100.00 mg | ORAL_TABLET | Freq: Four times a day (QID) | ORAL | Status: DC
Start: 2017-12-09 — End: 2017-12-12
  Administered 2017-12-09 – 2017-12-12 (×12): 105 mg via GASTROSTOMY
  Filled 2017-12-09 (×2): qty 2
  Filled 2017-12-09: qty 1
  Filled 2017-12-09 (×2): qty 2
  Filled 2017-12-09: qty 1
  Filled 2017-12-09 (×5): qty 2
  Filled 2017-12-09: qty 1
  Filled 2017-12-09 (×2): qty 2

## 2017-12-09 MED ORDER — ELECTROLYTE-A INTRAVENOUS SOLUTION BOLUS
1000.0000 mL | INTRAVENOUS | Status: AC
Start: 2017-12-10 — End: 2017-12-09
  Administered 2017-12-09: 0 mL via INTRAVENOUS
  Administered 2017-12-09: 1000 mL via INTRAVENOUS

## 2017-12-09 MED ORDER — METHOCARBAMOL 500 MG TABLET
500.00 mg | ORAL_TABLET | Freq: Four times a day (QID) | ORAL | Status: DC
Start: 2017-12-09 — End: 2017-12-13
  Administered 2017-12-09 – 2017-12-12 (×15): 500 mg via GASTROSTOMY
  Filled 2017-12-09 (×19): qty 1

## 2017-12-09 MED ORDER — PIPERACILLIN-TAZOBACTAM 3.375 GRAM INTRAVENOUS SOLUTION
3.3750 g | Freq: Three times a day (TID) | INTRAVENOUS | Status: DC
Start: 2017-12-09 — End: 2017-12-17
  Administered 2017-12-09: 3.375 g via INTRAVENOUS
  Administered 2017-12-10: 0 g via INTRAVENOUS
  Administered 2017-12-10: 3.375 g via INTRAVENOUS
  Administered 2017-12-10: 0 g via INTRAVENOUS
  Administered 2017-12-10 (×2): 3.375 g via INTRAVENOUS
  Administered 2017-12-10: 0 g via INTRAVENOUS
  Administered 2017-12-11: 3.375 g via INTRAVENOUS
  Administered 2017-12-11: 0 g via INTRAVENOUS
  Administered 2017-12-11 (×2): 3.375 g via INTRAVENOUS
  Administered 2017-12-11 (×2): 0 g via INTRAVENOUS
  Administered 2017-12-12: 3.375 g via INTRAVENOUS
  Administered 2017-12-12: 0 g via INTRAVENOUS
  Administered 2017-12-12: 3.375 g via INTRAVENOUS
  Administered 2017-12-12 (×2): 0 g via INTRAVENOUS
  Administered 2017-12-12 – 2017-12-13 (×2): 3.375 g via INTRAVENOUS
  Administered 2017-12-13: 0 g via INTRAVENOUS
  Administered 2017-12-13 (×2): 3.375 g via INTRAVENOUS
  Administered 2017-12-14: 0 g via INTRAVENOUS
  Administered 2017-12-14 (×2): 3.375 g via INTRAVENOUS
  Administered 2017-12-14 (×2): 0 g via INTRAVENOUS
  Administered 2017-12-14: 3.375 g via INTRAVENOUS
  Administered 2017-12-15 (×3): 0 g via INTRAVENOUS
  Administered 2017-12-15 – 2017-12-16 (×5): 3.375 g via INTRAVENOUS
  Administered 2017-12-16: 0 g via INTRAVENOUS
  Administered 2017-12-16: 3.375 g via INTRAVENOUS
  Administered 2017-12-16 (×2): 0 g via INTRAVENOUS
  Administered 2017-12-17: 3.375 g via INTRAVENOUS
  Administered 2017-12-17: 0 g via INTRAVENOUS
  Administered 2017-12-17: 3.375 g via INTRAVENOUS
  Administered 2017-12-17: 0 g via INTRAVENOUS
  Filled 2017-12-09 (×24): qty 15

## 2017-12-09 MED ORDER — ONDANSETRON HCL (PF) 4 MG/2 ML INJECTION SOLUTION
4.00 mg | Freq: Three times a day (TID) | INTRAMUSCULAR | Status: DC | PRN
Start: 2017-12-09 — End: 2017-12-17
  Administered 2017-12-10 – 2017-12-17 (×5): 4 mg via INTRAVENOUS
  Filled 2017-12-09 (×5): qty 2

## 2017-12-09 MED ORDER — SODIUM CHLORIDE 0.9 % IV BOLUS
40.0000 mL | INJECTION | Freq: Once | Status: AC | PRN
Start: 2017-12-09 — End: 2017-12-09

## 2017-12-09 MED ORDER — PIPERACILLIN-TAZOBACTAM 4.5 GRAM INTRAVENOUS SOLUTION
4.5000 g | Freq: Once | INTRAVENOUS | Status: AC
Start: 2017-12-09 — End: 2017-12-09
  Administered 2017-12-09: 4.5 g via INTRAVENOUS
  Administered 2017-12-09: 0 g via INTRAVENOUS
  Filled 2017-12-09: qty 20

## 2017-12-09 MED ORDER — ONDANSETRON HCL (PF) 4 MG/2 ML INJECTION SOLUTION
INTRAMUSCULAR | Status: AC
Start: 2017-12-09 — End: 2017-12-09
  Administered 2017-12-09: 4 mg via INTRAVENOUS
  Filled 2017-12-09: qty 2

## 2017-12-09 MED ADMIN — morphine 10 mg/5 mL oral solution: @ 21:00:00

## 2017-12-09 MED ADMIN — enoxaparin 30 mg/0.3 mL subcutaneous syringe: SUBCUTANEOUS | @ 18:00:00

## 2017-12-09 MED ADMIN — mupirocin 2 % topical ointment: INTRAVENOUS | @ 15:00:00 | NDC 00029152544

## 2017-12-09 MED ADMIN — sodium chloride 0.9 % intravenous solution: TRANSDERMAL | @ 20:00:00 | NDC 00338004904

## 2017-12-09 MED ADMIN — lactated Ringers intravenous solution: GASTROSTOMY | @ 22:00:00 | NDC 00338011704

## 2017-12-09 MED ADMIN — DOPAMINE PEDS INFUSION - 400 MG/10 ML (40 MG/ML) VIAL PREP: GASTROSTOMY | @ 18:00:00

## 2017-12-09 MED ADMIN — sodium chloride 0.9 % intravenous solution: GASTROSTOMY | @ 22:00:00

## 2017-12-09 MED ADMIN — lactated Ringers intravenous solution: RESPIRATORY_TRACT | @ 09:00:00 | NDC 00264775000

## 2017-12-09 NOTE — Respiratory Therapy (Signed)
Pt went 160 minutes on ATC 28 %    Pt FVC was .700   Pt did cough Assist 3 cycles with some resistance  Will continue to monitor pt airway and oxygen requirements  Will continue to encourage deep breathing and cough

## 2017-12-09 NOTE — Nurses Notes (Signed)
12/09/17 2300   Vital Signs   BP (Non-Invasive) (!) 75/37   MAP (Non-Invasive) 49 mmHG     SICU notified of NIBP. Levophed has been off since ~2000. Bladder scan at 2000 also only ~200. Orders placed for a 1L Plasmalyte-A fluid bolus. Will re-bladder scan patient following fluid bolus.
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
## 2017-12-09 NOTE — Nurses Notes (Signed)
12/09/17 0600   Urine Assessment   Bladder Scan Performed Y   Estimated Bladder Volume 646 mL       SICU resident notified of above bladder scan. Orders placed for one time straight cath. Straight cath'ed for 600cc of clear,amber urine.

## 2017-12-09 NOTE — Care Plan (Signed)
VENTILATOR - SIMV(PRVC)PS / APV-SIMV QHS (2200)    Discontinue  Reschedule   Comments: For sigh breath per spine guidelines   Duration: Until Specified    Priority: Routine       Question Answer Comment   FIO2 (%) 30    Peep(cm/H2O) 10    Rate(bpm) 12    Tidal Volume(mls) 700    Pressure Support(cm/H2O) 14    Indications IMPROVE DISTRIBUTION OF VENTILATION        12/08/17 1017    12/08/17 1300  VENTILATOR - CPAP(PS) / SPONTANEOUS CONTINUOUS    Discontinue   Duration: Until Specified    Priority: Routine       Question Answer Comment   FIO2 (%) 30    Peep(cm/H2O) 10    Pressure Support(cm/H2O) 10    Indications IMPROVE DISTRIBUTION OF VENTILATION    Invasive/Non-Invasive INVASIVE        12/08/17 1017   12/08/17 1030  RESPIRATORY: INITIATE AEROSOLIZED TRACHEOSTOMY COLLAR (ATC) TRIAL BASED ON FVC VALUE EVERY 8 HOURS    Discontinue  Reschedule   Comments: FIO2 > 50% Will be set up with Double Flow O2   Duration: Until Specified    Priority: Routine       Process Instructions: Initiate Aerosolized Tracheostomy Collar (ATC) Trial based on the patient's FVC value. Document FVC at the end of each ATC trial.    Question Answer Comment   FIO2 (%) 91    Indications for O2 IMPROVE OXYGENATION    FVC LESS THAN 250 mL START WITH 5 MINUTES Q8H    FVC BETWEEN 250-500 mL START WITH 15 MINUTES Q8H    FVC BETWEEN 500-750 mL START WITH 30 MINUTES Q8H    FVC GREATER THAN 1000 mL START WITH 60 MINUTES Q8H        12/08/17 1016   12/08/17 1030  RESPIRATORY: PROGRESS ATC TRIAL INTENSITY ONCE FVC IS GREATER THAN 1000 ML AND THE PATIENT TOLERATES 60 MINUTES Q8H EVERY 12 HOURS    Discontinue  Reschedule   Duration: Until Specified    Priority: Routine       Process Instructions: During ATC Trial, once FVC is GREATER THAN 1000 mL and the patient tolerates 60 minutes Q8H, the Therapist may progress the ATC Trial intensity IF: FVC does not deteriorate at the end of each ATC trial, No atelectasis is deve loping on chest X-ray, and No respiratory  distress or desaturation occurs during ATC trials. Intensity is increased by no more than 1-2 steps based on the following table:   Question Answer Comment   STEP 1 DURATION OF ATC TRIAL - 1 HOUR  FREQUENCY - Q8H    STEP 2 DURATION OF ATC TRIAL - 2 HOURS  FREQUENCY - Q8H    STEP 3 DURATION OF ATC TRIAL - 4 HOURS  FREQUENCY - Q12H    STEP 4 DURATION OF ATC TRIAL - 8 HOURS  FREQUENCY - QD    STEP 5 DURATION OF ATC TRIAL - 12 HOURS  FREQUENCY - QD    STEP 6 DURATION OF ATC TRIAL - 16 HOURS  FREQUENCY - QD    STEP 7 DURATION OF ATC TRIAL - 24 HOURS  FREQUENCY - QD        12/08/17 1016   12/07/17 2000  RESPIRATORY CARE EVALUATION DAILY (2000)    Discontinue  Reschedule   Duration: 72 Hours    Priority: Routine       References: [facility] Respiratory Patient Assessment Protocol  [facility] Respiratory Medication Therapy Protocol    [facility] Respiratory Medication Therapy Algorithm   Question Answer Comment   Reason for Consult ASSESS AND RECOMMEND Acute Spinal Cord Injury / Assessment of Cough Assist Device   Reason for Consult OTHER(Specify in Comments)        12/07/17 1053   12/07/17 0600  RESPIRATORY PARAMETERS (FVC+NIF) DAILY (0600)    Discontinue  Reschedule   Duration: Until Specified    Priority: Routine       Question Answer Comment   Indications ASSESSMENT OF PULMONARY STATUS    RT to notify MD if FVC < (L) 1L    RT to notify MD if NIF < (cm/H2O) -20 cm/H2O        12/06/17 1054   12/06/17 1400  COUGHALATOR - THERAPIST WILL DETERMINE THE PRESSURES USED TID (1000,1400,2200)    Discontinue  Reschedule   Duration: Until Specified    Priority: Routine       Question: Indication: Answer: MOBILIZE SECRETIONS       Patient tolerated 90 min 40% ATC this shift. Rested on SIMV/PRVC rest of shift.

## 2017-12-09 NOTE — Care Plan (Signed)
[facility]  Rehabilitation Services  Physical Therapy Progress Note      Patient Name: Christy Turner  Date of Birth: 1945-07-18  Height:  163.8 cm (5' 4.5")  Weight:  95.8 kg (211 lb 3.2 oz)  Room/Bed: 11/A  Payor: OTHER AUTO / Plan: OTHER AUTO / Product Type: Auto /     Assessment:     Patient tolerated PROM to LE's. Noted left great toe trace movement on command. Noted deep pressure sensation to hips, thighs and buttocks.     Discharge Needs:   Equipment Recommendation: TBD      Discharge Disposition: inpatient rehabilitation facility    JUSTIFICATION OF DISCHARGE RECOMMENDATION   Based on current diagnosis, functional performance prior to admission, and current functional performance, this patient requires continued PT services in inpatient rehabilitation facility in order to achieve significant functional improvements in these deficit areas: gait, locomotion, and balance, muscle performance, neuromuscular.      Plan:   Continue to follow patient according to established plan of care.  The risks/benefits of therapy have been discussed with the patient/caregiver and he/she is in agreement with the established plan of care.     Subjective & Objective:        12/09/17 1552   Therapist Pager   PT Assigned/ Pager # steve [phone]   Rehab Session   Document Type therapy progress note (daily note)   Total PT Minutes: 29   Patient Effort good   Symptoms Noted During/After Treatment fatigue   General Information   Patient Profile Reviewed? yes   Medical Lines Telemetry;PIV Line;Foley Catheter;Arterial Line;Central Line   Respiratory Status ventilator   Mutuality/Individual Preferences   Individualized Care Needs OOB with maximove   Pre Treatment Status   Pre Treatment Patient Status Patient supine in bed;Call light within reach;Telephone within reach   Support Present Pre Treatment  Family present   Communication Pre Treatment  Nurse   Pain Assessment   Pre/Post Treatment Pain Comment Did C/O pain in left hip  with ROM   Therapeutic Exercise/Activity   Comment PROM of LE's   Post Treatment Status   Post Treatment Patient Status Patient supine in bed;Call light within reach;Telephone within reach   Support Present Post Treatment  Family present   Communication Post Treatement Nurse   Plan of Care Review   Plan Of Care Reviewed With patient;son   Physical Therapy Clinical Impression   Assessment Patient tolerated PROM to LE's. Noted left great toe trace movement on command. Noted deep pressure sensation to hips, thighs and buttocks.    Anticipated Equipment Needs at Discharge (PT) TBD   Anticipated Discharge Disposition inpatient rehabilitation facility   Planned Therapy Interventions, PT Eval   Planned Therapy Interventions (PT) balance training;bed mobility training;transfer training       Therapist:   Berneice Gandy, PT   Pager #: [phone]
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
## 2017-12-09 NOTE — Nurses Notes (Signed)
12/09/17 0000   Urine Assessment   Bladder Scan Performed Y   Estimated Bladder Volume 614 mL     Patient was bladder scanned for ~614cc. SICU resident notified. Orders placed for a one time straight cath. Patient straight cath for 500cc of clear, amber urine. Will continue to monitor.

## 2017-12-09 NOTE — Consults (Signed)
[place] [facility]  Spine Service  Attending: Inara Dike  Progress Note  12/09/2017    Name: Christy Turner  DOB: 08-24-1945  MRN: [id number]    RECENT SURGERY:  12/02/2017 - C2-T2 PSF w/C3-C7 decompression     SUBJECTIVE:  72 y.o. female resting in bed.  She is on levophed and trying to wean. Receiving G-tube feeds.  She has been getting up in the chair twice a day with assistance.  Continues to have no voice but mouths responses.    OBJECTIVE:  Febrile yesterday to 38.5 and has been tachy to 127.  GEN - NAD, resting in bed, tracheostomy in place  Non distended abdomen      Upper Extremity Motor    Left  Right      D  4  4  C5    WE  3  3  C6    Tri  0  0  C7    FF  0  0  C8    HI  0  0  T1            Fingers held in flexion.    Lower Extremity Motor    Left  Right      HF  0  0  L2    Q  0  0  L3    TA  0  0  L4    EHL  0  0  L5    G/S  1 0  S1            Sensory    Left  Right    C5 2 2   C6 2 2   C7 2 2   C8 1 1   T1 0 0   L2 0 0   L3 0 0   L4 0 0   L5 0 0   S1 0 0               Pertinent Labs:  CBC  (Last 48 hours)    Date/Time WBC HGB HCT MCV PLATELETS    12/09/17 0448 5.5    7.0 (L)    22.1 (L)    89.1    188       12/08/17 0029 6.6    7.7 (L)    24.8 (L)    89.9    164           BMP  (Last 48 hours)    Date/Time Na K Cl CO2 BUN CREAT Calcium Glucose    12/09/17 0448 139    3.8    104    28    18    0.54    7.6 (L)    121       12/08/17 0029 141    3.7    105    31    18    0.60    8.0 (L)    200 (H)             ASSESSMENT:  72 y.o. female  op s/p C2-T2 PSF w/ decompression C3-C7    PLAN:  - Weightbearing: WBAT  - PT/OT: recommend inpatient rehab  - DVT prophylaxis: per primary, on lovenox  - Antibiotics: clindamycin and zosyn per primary  - Dressing: Changed today, plan to change on 8/22.  - Labs: WBC not elevated. Hb continues to downward trend at 7.0 today.  - Follow-up: will coordinate follow up with Dr.  Mylie Mccurley closer to discharge    Marlou Starks, MD  Resident, PGY-1  Department of  Orthopaedics  Pager [phone]  12/09/2017 13:17

## 2017-12-10 ENCOUNTER — Inpatient Hospital Stay (HOSPITAL_COMMUNITY): Payer: Medicare HMO

## 2017-12-10 DIAGNOSIS — J189 Pneumonia, unspecified organism: Secondary | ICD-10-CM

## 2017-12-10 LAB — CBC
HCT: 25.1 % — ABNORMAL LOW (ref 34.8–46.0)
HCT: 25.1 % — ABNORMAL LOW (ref 34.8–46.0)
HGB: 8.1 g/dL — ABNORMAL LOW (ref 11.5–16.0)
MCH: 29.1 pg (ref 26.0–32.0)
MCHC: 32.3 g/dL (ref 31.0–35.5)
MCV: 90.3 fL (ref 78.0–100.0)
MPV: 11.1 fL (ref 8.7–12.5)
PLATELETS: 207 x10ˆ3/uL (ref 150–400)
RBC: 2.78 x10ˆ6/uL — ABNORMAL LOW (ref 3.85–5.22)
RDW-CV: 15.3 % (ref 11.5–15.5)
RDW-CV: 15.3 % (ref 11.5–15.5)
WBC: 6.5 x10ˆ3/uL (ref 3.7–11.0)

## 2017-12-10 LAB — BASIC METABOLIC PANEL
ANION GAP: 5 mmol/L (ref 4–13)
BUN/CREA RATIO: 32 — ABNORMAL HIGH (ref 6–22)
BUN/CREA RATIO: 32 — ABNORMAL HIGH (ref 6–22)
BUN: 18 mg/dL (ref 8–25)
CALCIUM: 7.6 mg/dL — ABNORMAL LOW (ref 8.5–10.2)
CHLORIDE: 105 mmol/L (ref 96–111)
CO2 TOTAL: 28 mmol/L (ref 22–32)
CREATININE: 0.56 mg/dL (ref 0.49–1.10)
ESTIMATED GFR: >59 mL/min/{1.73_m2} (ref >59)
GLUCOSE: 129 mg/dL (ref 65–139)
POTASSIUM: 4.2 mmol/L (ref 3.5–5.1)
SODIUM: 138 mmol/L (ref 136–145)

## 2017-12-10 LAB — BPAM PACKED CELL ORDER: UNIT DIVISION: 0

## 2017-12-10 LAB — POC BLOOD GLUCOSE (RESULTS)
GLUCOSE, POC: 117 mg/dL — ABNORMAL HIGH (ref 70–105)
GLUCOSE, POC: 125 mg/dL — ABNORMAL HIGH (ref 70–105)
GLUCOSE, POC: 130 mg/dL — ABNORMAL HIGH (ref 70–105)
GLUCOSE, POC: 130 mg/dl — ABNORMAL HIGH (ref 70–105)
GLUCOSE, POC: 136 mg/dL — ABNORMAL HIGH (ref 70–105)
GLUCOSE, POC: 139 mg/dL — ABNORMAL HIGH (ref 70–105)

## 2017-12-10 LAB — TYPE AND CROSS RED CELLS - UNITS
ABO/RH(D): O POS
ANTIBODY SCREEN: NEGATIVE
UNITS ORDERED: 1

## 2017-12-10 LAB — PHOSPHORUS: PHOSPHORUS: 3.4 mg/dL (ref 2.3–4.0)

## 2017-12-10 LAB — BRONCH BRUSH CULTURE & GRAM STAIN
GRAM STAIN: NONE SEEN
STERILE BRUSH CULTURE, QUANTITATIVE: 15000 — AB
STERILE BRUSH CULTURE, QUANTITATIVE: 30000 — AB

## 2017-12-10 LAB — MAGNESIUM: MAGNESIUM: 2.4 mg/dL (ref 1.6–2.6)

## 2017-12-10 MED ORDER — SERTRALINE 50 MG TABLET
50.0000 mg | ORAL_TABLET | Freq: Every day | ORAL | Status: DC
Start: 2017-12-10 — End: 2017-12-17
  Administered 2017-12-10 – 2017-12-17 (×8): 50 mg via GASTROSTOMY
  Filled 2017-12-10 (×8): qty 1

## 2017-12-10 MED ORDER — HYDROXYZINE PAMOATE 25 MG CAPSULE
25.00 mg | ORAL_CAPSULE | Freq: Four times a day (QID) | ORAL | Status: DC | PRN
Start: 2017-12-10 — End: 2017-12-11
  Administered 2017-12-10: 25 mg via GASTROSTOMY
  Filled 2017-12-10 (×2): qty 1

## 2017-12-10 MED ORDER — ACETAMINOPHEN 325 MG TABLET
975.00 mg | ORAL_TABLET | Freq: Four times a day (QID) | ORAL | Status: DC
Start: 2017-12-10 — End: 2017-12-17
  Administered 2017-12-10 (×2): 975 mg via GASTROSTOMY
  Administered 2017-12-10: 0 mg via GASTROSTOMY
  Administered 2017-12-11 – 2017-12-12 (×6): 975 mg via GASTROSTOMY
  Administered 2017-12-13: 0 mg via GASTROSTOMY
  Administered 2017-12-13 – 2017-12-16 (×12): 975 mg via GASTROSTOMY
  Administered 2017-12-16: 0 mg via GASTROSTOMY
  Administered 2017-12-16 – 2017-12-17 (×4): 975 mg via GASTROSTOMY
  Filled 2017-12-10 (×10): qty 3
  Filled 2017-12-10: qty 2
  Filled 2017-12-10 (×19): qty 3

## 2017-12-10 MED ORDER — GABAPENTIN 400 MG CAPSULE
400.00 mg | ORAL_CAPSULE | Freq: Three times a day (TID) | ORAL | Status: DC
Start: 2017-12-10 — End: 2017-12-14
  Administered 2017-12-10 – 2017-12-14 (×13): 400 mg via GASTROSTOMY
  Filled 2017-12-10 (×12): qty 1

## 2017-12-10 MED ORDER — HYDROXYZINE PAMOATE 25 MG CAPSULE
25.00 mg | ORAL_CAPSULE | Freq: Four times a day (QID) | ORAL | Status: DC | PRN
Start: 2017-12-10 — End: 2017-12-10
  Filled 2017-12-10: qty 1

## 2017-12-10 MED ADMIN — ketamine 50 mg/mL injection solution: GASTROSTOMY | @ 09:00:00

## 2017-12-10 MED ADMIN — bacitracin 500 unit/gram topical ointment: INTRAVENOUS | @ 15:00:00 | NDC 45802006001

## 2017-12-10 MED ADMIN — erythromycin 5 mg/gram (0.5 %) eye ointment: GASTROSTOMY | @ 15:00:00 | NDC 17478082401

## 2017-12-10 MED ADMIN — lactated Ringers intravenous solution: INTRAVENOUS | @ 11:00:00

## 2017-12-10 MED ADMIN — sodium chloride 0.9 % (flush) injection syringe: GASTROSTOMY | @ 09:00:00

## 2017-12-10 MED ADMIN — sodium chloride 0.9 % (flush) injection syringe: GASTROSTOMY | @ 21:00:00

## 2017-12-10 MED ADMIN — fentaNYL (PF) 50 mcg/mL intravenous solution: SUBCUTANEOUS | @ 07:00:00
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
## 2017-12-10 NOTE — Care Plan (Signed)
Remains trached and on ventilatory support. Plan for trach collar as long as pt can tolerate. Continues to have loose BM's. Respoitioned frequently. Foot splints on at this time. Tolerating tube feeds well. Family with pt.

## 2017-12-10 NOTE — Nurses Notes (Signed)
Patient continues to have nausea despite receiving Zofran around 1940. Patient is due for the 5th can of tube feed for the day around 2200. Patient refusing tube feed stating "I don't want that, I still feel so sick and full." SICU team notified on evening rounds that patient was still not feeling well and refusing the 2200 feed and stated that was fine to hold and re-assess in the AM for the 0600 feed. 2200 scheduled Inulin held since the tube feeding was not administered.

## 2017-12-10 NOTE — Care Plan (Signed)
Patient was placed on ATC for 30 min this morning due to spinal protocol and FVC being .5. Per Dr. Terrilee Croak to stretch the ATC for as long as patient will tolerate today. Patient has been on ATC since 1134 and tolerating well. Cough assist given TID with moderate white secretions being suctioned. Patient is also receiving TID albuterol 3% hypertonic tx as ordered. Will continue to monitor and follow.
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
## 2017-12-10 NOTE — Care Plan (Signed)
[facility]  Rehabilitation Services  Physical Therapy Progress Note      Patient Name: Christy Turner  Date of Birth: 1946-03-17  Height:  163.8 cm (5' 4.5")  Weight:  99.9 kg (220 lb 3.8 oz)  Room/Bed: 11/A  Payor: OTHER AUTO / Plan: OTHER AUTO / Product Type: Auto /     Assessment:     Patient was able to sit up in chair with mod assist x 2 for position and to maintain. Able to sit x 3 minutes before becoming fatigued.    Discharge Needs:   Equipment Recommendation: TBD    Discharge Disposition: inpatient rehabilitation facility    JUSTIFICATION OF DISCHARGE RECOMMENDATION   Based on current diagnosis, functional performance prior to admission, and current functional performance, this patient requires continued PT services in inpatient rehabilitation facility in order to achieve significant functional improvements in these deficit areas: gait, locomotion, and balance, muscle performance, neuromuscular.      Plan:   Continue to follow patient according to established plan of care.  The risks/benefits of therapy have been discussed with the patient/caregiver and he/she is in agreement with the established plan of care.     Subjective & Objective:        12/10/17 5397   Therapist Pager   PT Assigned/ Pager # [phone]   Rehab Session   Document Type therapy progress note (daily note)   Total PT Minutes: 24   Patient Effort good   Symptoms Noted During/After Treatment fatigue   General Information   Patient Profile Reviewed? yes   Medical Lines Telemetry;PIV Line;Central Line;Arterial Line   Respiratory Status aerosol trach collar   Mutuality/Individual Preferences   Individualized Care Needs OOB with maxi move.   Pre Treatment Status   Pre Treatment Patient Status Patient sitting in bedside chair or w/c;Call light within reach;Telephone within reach;Nurse approved session   Support Present Pre Treatment  Family present   Communication Pre Treatment  Nurse   Pain Assessment   Pre/Post Treatment Pain  Comment C/O pain in upper back   Transfer Assessment/Treatment   Transfer Comment Sit upright in chair with mod x 2. Tolerated 3 min before becoming dizzy.   Post Treatment Status   Post Treatment Patient Status Patient sitting in bedside chair or w/c;Call light within reach;Telephone within reach   Support Present Post Treatment  Family present   Communication Post Treatement Nurse   Plan of Care Review   Plan Of Care Reviewed With patient;son   Physical Therapy Clinical Impression   Assessment Patient was able to sit up in chair with mod assist x 2 for position and to maintain. Able to sit x 3 minutes before becoming fatigued.   Anticipated Equipment Needs at Discharge (PT) TBD   Anticipated Discharge Disposition inpatient rehabilitation facility   Planned Therapy Interventions, PT Eval   Planned Therapy Interventions (PT) balance training;bed mobility training;transfer training       Therapist:   Berneice Gandy, PT   Pager #: [phone]

## 2017-12-10 NOTE — Respiratory Therapy (Signed)
Patient reached in her FVC .5 Patient was placed on ATC for 30 min. Patient tolerated being on ATC. Patient placed back on vent after 30 min for rest per spinal protocol.RT will repeat FVC in 8 hours and try ATC again. Will continue to ,monitor and follow.

## 2017-12-10 NOTE — Care Plan (Signed)
[facility]  Rehabilitation Services  Occupational Therapy Progress Note    Patient Name: Christy Turner  Date of Birth: 1945-09-16  Height:  163.8 cm (5' 4.5")  Weight:  99.9 kg (220 lb 3.8 oz)  Room/Bed: 11/A  Payor: OTHER AUTO / Plan: OTHER AUTO / Product Type: Auto /     Assessment:    Pt tolerated OT treatment fairly well, withdrawn mood noted and pt indicating frustration over situation and feeling as though she is "getting worse, not better."  She was able to tolerate sitting upright in chair with Mod Ax2 for trunk support ~3 minutes x2 and demonstrates BUE active movement to C5-6 level however lacking motor control for functional BUE use requiring Max-DEP assist for all ADLs at this time. Recommend continued skilled OT in SCI inpatient rehab to increase safety and (I) with ADLs/IADLs/functional mobility when medically stable for d/c.      Discharge Needs:   Equipment Recommendation: to be determined    Discharge Disposition:  inpatient rehabilitation facility   The above recommendation is based upon the current examination and evaluation performed on this date. As subsequent sessions are completed, recommendations will be updated accordingly.  JUSTIFICATION OF DISCHARGE RECOMMENDATION   Based on current diagnosis, functional performance prior to admission, and current functional performance, this patient requires continued OT services in inpatient rehabilitation facility in order to achieve significant functional improvements.    Plan:   Continue to follow patient according to established plan of care.  The risks/benefits of therapy have been discussed with the patient/caregiver and he/she is in agreement with the established plan of care.     Subjective & Objective:        12/10/17 1165   Therapist Pager   OT Assigned/ Pager # Quay Simkin [phone]   Rehab Session   Document Type therapy progress note (daily note)   Total OT Minutes: 24   Patient Effort good   Symptoms Noted During/After Treatment  fatigue   Symptoms Noted Comment pt with increased fatigue following sitting activity   General Information   Patient Profile Reviewed? yes   Onset of Illness/Injury or Date of Surgery 12/02/17   Medical Lines Telemetry;PIV Line;Central Line;Arterial Line   Respiratory Status aerosol trach collar   Existing Precautions/Restrictions fall precautions;full code;spinal precautions   Pre Treatment Status   Pre Treatment Patient Status Patient sitting in bedside chair or w/c;Call light within reach;Telephone within reach;Sitter select activated   Support Present Pre Treatment  Family present   Communication Pre Treatment  Nurse   Communication Pre Treatment Comment RN approving OT treatment, completed with professional assist of PT   Mutuality/Individual Preferences   Individualized Care Needs OOB via maximove, ROM BUE/BLE with routine self care   Vital Signs   Vitals Comment vss on aerisol trach collar   Pain Assessment   Pre/Post Treatment Pain Comment pt inidicating pain in upper back and chest, did not rate   Coping/Psychosocial   Observed Emotional State withdrawn;cooperative   Plan of Care Reviewed With patient;son   Cognitive Assessment/Interventions   Behavior/Mood Observations alert;cooperative;sad/withdrawn   Orientation Status oriented to;person;situation   Attention WNL/WFL   Follows Commands WFL   Comment pt with withdrawn mood noted and idicating frustration over situation and recovery   RUE Assessment   RUE Strength shoulder elevation 3+/5, shoulder abduction/adduction 2-/5, horizontal abduction/adduction 2-/5, shoulder flexion 1/5, elbow flexion 2+/5, elbow extension 1/5, wrist extension 2-/5, wrist flexion 0/5, supination/pronation 2+/5, no active digit movement   LUE Assessment  LUE Strength shoulder elevation 3+/5, shoulder abducation/adduction 2-/5, horizontal abduction/adduction 2-/5, shoulder flexion 1/5, elbow flexion 2+/5, elbow extension 1/5, wrist extension 1/5, wrist flexion 0/5,  supination/pronation 2-/5, no active digit movement   Mobility Assessment/Training   Mobility Comment not appropriate for functional transfers or ambulation at this time   Transfer Assessment/Treatment   Transfer Comment pt seated in bed side chair, she was able to assist with BUE to pull forward to unsupported sitting with Mod Ax2 for trunk control and support, tolerating 2x~3 minutes of upright sitting   ADL Assessment/Intervention   ADL Comments pt requiring total assist with ADLs at this time d/t impaired functional use and motor control of BUE   Grooming Assessment/Training   Position sitting   Independence Level dependent (less than 25% patient effort)   Impairments activity tolerance impaired;balance impaired;coordination impaired;motor control impaired;ROM decreased;strength decreased;postural control impaired   Comment to complete facial hygiene   Balance Skill Training   Comment pt tolerated upright sitting posture with Mod Ax2 to maintain    Sitting Balance: Static poor balance   Sitting, Dynamic (Balance) unable to balance   Therapeutic Exercise/Activity   Comment pt completing preperatory activity bringing BUE to mouth with proximal support at elbows and distal support of BUE for coordination and control with elbow extension for return to resting position, at this time pt would require [facility] assist to utilize BUE for functional tasks. PROM/AAROM in all planes   Post Treatment Status   Post Treatment Patient Status Patient sitting in bedside chair or w/c;Call light within reach;Telephone within reach   Support Present Post Treatment  Other (See comments);Family present  (RT)   Film/video editor Nurse   Communication Post Treatment Comment RN updated on pt performance   Occupational Therapy Clinical Impression   Functional Level at Time of Session Pt tolerated OT treatment fairly well, withdrawn mood noted and pt indicating frustration over situation and feeling as though she is "getting  worse, not better."  She was able to tolerate sitting upright in chair with Mod Ax2 for trunk support ~3 minutes x2 and demonstrates BUE active movement to C5-6 level however lacking motor control for functional BUE use requiring Max-DEP assist for all ADLs at this time. Recommend continued skilled OT in SCI inpatient rehab to increase safety and (I) with ADLs/IADLs/functional mobility when medically stable for d/c.   Anticipated Equipment Needs at Discharge to be determined   Anticipated Discharge Disposition inpatient rehabilitation facility       Therapist:   Shari Heritage, OT  Pager #: [phone]

## 2017-12-10 NOTE — Nurses Notes (Signed)
Notified Eliott Nine (SICU Resident) of straight cath volume of 1000 cc clear yellow urine. See flow sheets for bladder scan totals. Verbal order per Dr. Renato Gails to bladder scan patient every 2 hours. If patient is less than 200 cc will re-order q 4 hour bladder scan. Will continue to monitor.
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
## 2017-12-10 NOTE — Respiratory Therapy (Signed)
VENTILATOR - SIMV(PRVC)PS / APV-SIMV QHS (2200) DiscontinueReschedule   Comments: For sigh breath per spine guidelines   Duration: Until Specified    Priority: Routine       Question Answer Comment   FIO2 (%) 30    Peep(cm/H2O) 10    Rate(bpm) 12    Tidal Volume(mls) 700    Pressure Support(cm/H2O) 14    Indications IMPROVE DISTRIBUTION OF VENTILATION         Patient placed on following settings overnight per MD Andrey Campanile. Patient stated that it was hard to breath, but after talking with her she was ok. Patient does ok with fvc/nif but complains of pain when using cough assist. Will continue to monitor patient respiratory status and place on ATC based on FVC result.

## 2017-12-10 NOTE — Progress Notes (Signed)
Attending  Pt on levophed and sudaphed for MAP pushes.  Now oliguric for several hours.  I think she is vasoconstricted and hypovolemic making her at risk for AKI.  Will bolus fluid.  If no void then will place foley to better monitor I and O until oliguria corrected.  Merri Ray, MD  12/10/2017, 00:18

## 2017-12-10 NOTE — Nurses Notes (Signed)
Patient refusing to get out of bed this evening. Stating "I just don't feel good." Nurse educated patient and daughter of importance of getting out of bed. Patient still refusing.     1810: Nightly rounds at bedside. Nurse notified Dr. Terrilee Croak of patient's refusal. Dr. Terrilee Croak spoke with patient and re-educated her. Agreement between patient, Dr. Terrilee Croak, and nurse made to get patient out of bed twice tomorrow.

## 2017-12-11 ENCOUNTER — Inpatient Hospital Stay (HOSPITAL_COMMUNITY): Payer: Medicare HMO

## 2017-12-11 ENCOUNTER — Encounter (HOSPITAL_COMMUNITY): Payer: Self-pay | Admitting: Student in an Organized Health Care Education/Training Program

## 2017-12-11 DIAGNOSIS — R140 Abdominal distension (gaseous): Secondary | ICD-10-CM

## 2017-12-11 DIAGNOSIS — J189 Pneumonia, unspecified organism: Secondary | ICD-10-CM | POA: Diagnosis not present

## 2017-12-11 DIAGNOSIS — Y95 Nosocomial condition: Secondary | ICD-10-CM | POA: Diagnosis not present

## 2017-12-11 DIAGNOSIS — N390 Urinary tract infection, site not specified: Secondary | ICD-10-CM | POA: Diagnosis not present

## 2017-12-11 DIAGNOSIS — J9690 Respiratory failure, unspecified, unspecified whether with hypoxia or hypercapnia: Secondary | ICD-10-CM | POA: Diagnosis present

## 2017-12-11 DIAGNOSIS — R110 Nausea: Secondary | ICD-10-CM

## 2017-12-11 DIAGNOSIS — J90 Pleural effusion, not elsewhere classified: Secondary | ICD-10-CM

## 2017-12-11 HISTORY — DX: Nosocomial condition: Y95

## 2017-12-11 LAB — CBC
HCT: 27.3 % — ABNORMAL LOW (ref 34.8–46.0)
HGB: 8.8 g/dL — ABNORMAL LOW (ref 11.5–16.0)
MCH: 29.1 pg (ref 26.0–32.0)
MCHC: 32.2 g/dL (ref 31.0–35.5)
MCV: 90.4 fL (ref 78.0–100.0)
MPV: 10.9 fL (ref 8.7–12.5)
PLATELETS: 263 x10ˆ3/uL (ref 150–400)
RBC: 3.02 x10ˆ6/uL — ABNORMAL LOW (ref 3.85–5.22)
RDW-CV: 15.9 % — ABNORMAL HIGH (ref 11.5–15.5)
WBC: 10.2 x10ˆ3/uL (ref 3.7–11.0)

## 2017-12-11 LAB — BASIC METABOLIC PANEL
ANION GAP: 6 mmol/L (ref 4–13)
BUN/CREA RATIO: 29 — ABNORMAL HIGH (ref 6–22)
BUN/CREA RATIO: 29 — ABNORMAL HIGH (ref 6–22)
BUN: 16 mg/dL (ref 8–25)
CALCIUM: 8 mg/dL — ABNORMAL LOW (ref 8.5–10.2)
CHLORIDE: 102 mmol/L (ref 96–111)
CO2 TOTAL: 28 mmol/L (ref 22–32)
CREATININE: 0.56 mg/dL (ref 0.49–1.10)
ESTIMATED GFR: >59 mL/min/1.73mˆ2 (ref >59)
GLUCOSE: 122 mg/dL (ref 65–139)
POTASSIUM: 4.5 mmol/L (ref 3.5–5.1)
SODIUM: 136 mmol/L (ref 136–145)

## 2017-12-11 LAB — PHOSPHORUS: PHOSPHORUS: 3.9 mg/dL (ref 2.3–4.0)

## 2017-12-11 LAB — MAGNESIUM: MAGNESIUM: 2.5 mg/dL (ref 1.6–2.6)

## 2017-12-11 LAB — POC BLOOD GLUCOSE (RESULTS)
GLUCOSE, POC: 105 mg/dL (ref 70–105)
GLUCOSE, POC: 114 mg/dL — ABNORMAL HIGH (ref 70–105)
GLUCOSE, POC: 127 mg/dL — ABNORMAL HIGH (ref 70–105)

## 2017-12-11 MED ORDER — INSULIN GLARGINE (U-100) 100 UNIT/ML SUBCUTANEOUS SOLUTION
10.0000 [IU] | Freq: Every evening | SUBCUTANEOUS | Status: DC
Start: 2017-12-11 — End: 2017-12-11

## 2017-12-11 MED ORDER — HYDROXYZINE PAMOATE 25 MG CAPSULE
25.00 mg | ORAL_CAPSULE | Freq: Every evening | ORAL | Status: DC | PRN
Start: 2017-12-11 — End: 2017-12-16
  Administered 2017-12-11 – 2017-12-12 (×2): 25 mg via GASTROSTOMY
  Filled 2017-12-11 (×4): qty 1

## 2017-12-11 MED ORDER — INSULIN GLARGINE (U-100) 100 UNIT/ML SUBCUTANEOUS SOLUTION
20.0000 [IU] | Freq: Every day | SUBCUTANEOUS | Status: DC
Start: 2017-12-12 — End: 2017-12-16
  Administered 2017-12-12 – 2017-12-16 (×5): 20 [IU] via SUBCUTANEOUS
  Filled 2017-12-11 (×5): qty 20

## 2017-12-11 MED ORDER — FAMOTIDINE 20 MG TABLET
20.00 mg | ORAL_TABLET | Freq: Two times a day (BID) | ORAL | Status: DC
Start: 2017-12-11 — End: 2017-12-17
  Administered 2017-12-11 – 2017-12-17 (×12): 20 mg via GASTROSTOMY
  Filled 2017-12-11 (×12): qty 1

## 2017-12-11 MED ORDER — INSULIN GLARGINE (U-100) 100 UNIT/ML SUBCUTANEOUS SOLUTION
5.00 [IU] | SUBCUTANEOUS | Status: AC
Start: 2017-12-11 — End: 2017-12-11
  Administered 2017-12-11: 5 [IU] via SUBCUTANEOUS
  Filled 2017-12-11: qty 5

## 2017-12-11 MED ADMIN — ondansetron HCl (PF) 4 mg/2 mL injection solution: INTRAVENOUS | @ 08:00:00

## 2017-12-11 MED ADMIN — HYDROmorphone 0.5 mg/0.5 mL injection syringe: GASTROSTOMY | @ 21:00:00

## 2017-12-11 MED ADMIN — nystatin 100,000 unit/gram topical powder: GASTROSTOMY | @ 18:00:00 | NDC 00574200815

## 2017-12-11 MED ADMIN — sodium chloride 0.9 % (flush) injection syringe: GASTROSTOMY | @ 21:00:00

## 2017-12-11 MED ADMIN — sodium chloride 0.9 % (flush) injection syringe: GASTROSTOMY | @ 09:00:00

## 2017-12-11 MED ADMIN — oxyCODONE 5 mg tablet: TRANSDERMAL | @ 20:00:00
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
## 2017-12-11 NOTE — Procedures (Signed)
[facility] Line Removal  Procedure Date:  12/11/2017 Time:  1740  Procedure: Central Line Removal  Indication: pressors d/c'ed, alternate access available    Description:  The R groin central line was identified and the dressing was taken down.  sutures holding the central line in place were clipped.  The central line was pulled and pressure was held for 10 minutes.  No bleeding was visualized when the dressing was removed.  Patient tolerated procedure without complications.     Procedure was performed by Dr. Fredderick Erb.    Dr. Francesco Sor was readily available to assist in the procedure    Thera Flake, MD 12/11/2017, 17:51
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
## 2017-12-11 NOTE — Care Plan (Signed)
[facility]  Rehabilitation Services  Physical Therapy Progress Note      Patient Name: Christy Turner  Date of Birth: 1946/03/22  Height:  163.8 cm (5' 4.5")  Weight:  99.9 kg (220 lb 3.8 oz)  Room/Bed: 11/A  Payor: OTHER AUTO / Plan: OTHER AUTO / Product Type: Auto /     Assessment:     Patient was able to sit to EOB with dependent assist x 2. Unable to maintain without assist. Did have C/O pain in back with sitting.    Discharge Needs:   Equipment Recommendation: TBD    Discharge Disposition: inpatient rehabilitation facility    JUSTIFICATION OF DISCHARGE RECOMMENDATION   Based on current diagnosis, functional performance prior to admission, and current functional performance, this patient requires continued PT services in inpatient rehabilitation facility in order to achieve significant functional improvements in these deficit areas: gait, locomotion, and balance, muscle performance, neuromuscular.      Plan:   Continue to follow patient according to established plan of care.  The risks/benefits of therapy have been discussed with the patient/caregiver and he/she is in agreement with the established plan of care.     Subjective & Objective:        12/11/17 1227   Therapist Pager   PT Assigned/ Pager # steve [phone]   Rehab Session   Document Type therapy progress note (daily note)   Total PT Minutes: 41   Patient Effort good   Symptoms Noted During/After Treatment fatigue   General Information   Patient Profile Reviewed? yes   Medical Lines Telemetry;PIV Line   Respiratory Status aerosol trach collar   Mutuality/Individual Preferences   Individualized Care Needs OOB with maxi move   Pre Treatment Status   Pre Treatment Patient Status Patient supine in bed;Call light within reach;Telephone within reach;Nurse approved session   Support Present Pre Treatment  Family present   Communication Pre Treatment  Nurse   Pain Assessment   Pre/Post Treatment Pain Comment C/O back pain with sitting EOB.   Bed  Mobility Assessment/Treatment   Supine-Sit Independence dependent (less than 25% patient effort);2 person assist required   Sit to Supine, Independence dependent (less than 25% patient effort);2 person assist required   Balance Skill Training   Sitting Balance: Static unable to balance   Sitting, Dynamic (Balance) unable to balance   Plan of Care Review   Plan Of Care Reviewed With patient;family   Physical Therapy Clinical Impression   Assessment Patient was able to sit to EOB with dependent assist x 2. Unable to maintain without assist. Did have C/O pain in back with sitting.   Anticipated Equipment Needs at Discharge (PT) TBD   Anticipated Discharge Disposition inpatient rehabilitation facility   Planned Therapy Interventions, PT Eval   Planned Therapy Interventions (PT) balance training;bed mobility training;transfer training       Therapist:   Berneice Gandy, PT   Pager #: [phone]
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
## 2017-12-11 NOTE — Care Plan (Signed)
[facility]  Rehabilitation Services  Occupational Therapy Progress Note    Patient Name: Christy Turner  Date of Birth: 1945-07-16  Height:  163.8 cm (5' 4.5")  Weight:  99.9 kg (220 lb 3.8 oz)  Room/Bed: 11/A  Payor: OTHER AUTO / Plan: OTHER AUTO / Product Type: Auto /     Assessment:    Pt with fair tolerance to EOB sitting. Tolerates ~10 minutes. Will continue to assess for hand splints.Family eductaed on PROM and positioning Acute rehab at d/c      Discharge Needs:   Equipment Recommendation: to be determined        Discharge Disposition:  inpatient rehabilitation facility     JUSTIFICATION OF DISCHARGE RECOMMENDATION   Based on current diagnosis, functional performance prior to admission, and current functional performance, this patient requires continued OT services in inpatient rehabilitation facility in order to achieve significant functional improvements.    Plan:   Continue to follow patient according to established plan of care.  The risks/benefits of therapy have been discussed with the patient/caregiver and he/she is in agreement with the established plan of care.     Subjective & Objective:        12/11/17 1228   Therapist Pager   OT Assigned/ Pager # Annice Pih [phone] [facility]   Rehab Session   Document Type therapy progress note (daily note)   Total OT Minutes: 41   Patient Effort good   Symptoms Noted During/After Treatment fatigue   General Information   Respiratory Status aerosol trach collar   Existing Precautions/Restrictions c-collar;fall precautions;spinal precautions;weight bearing restriction   Pre Treatment Status   Pre Treatment Patient Status Patient supine in bed   Support Present Pre Treatment  Family present;Nurse present   Communication Pre Treatment  Nurse   Mutuality/Individual Preferences   Individualized Care Needs Maxi Move OOB   Pain Assessment   Pre/Post Treatment Pain Comment reports back pain in EOB sitting   Bed Mobility Assessment/Treatment   Supine-Sit Independence  dependent (less than 25% patient effort);2 person assist required   Sit to Supine, Independence dependent (less than 25% patient effort);2 person assist required   Balance Skill Training   Sitting Balance: Static unable to balance   Sitting, Dynamic (Balance) unable to balance   Therapeutic Exercise/Activity   Comment worked on EOB sitting however unable to tolerate due to pain. Tolerates ~10 minutes with MaxAx2 for sittig balance B Hands assessed for splinting. Currnetly not appropriate Family educated on PROM to hand and wrist and elevated positioning. Will reassess Monday regarding splints   Post Treatment Status   Post Treatment Patient Status Patient supine in bed;Call light within reach   Support Present Post Treatment  Nurse present;Family present   Communication Post Treatement Nurse   Occupational Therapy Clinical Impression   Functional Level at Time of Session Pt with fair tolerance to EOB sitting. Tolerates ~10 minutes. Will continue to assess for hand splints.Family eductaed on PROM and positioning Acute rehab at d/c   Anticipated Equipment Needs at Discharge to be determined   Anticipated Discharge Disposition inpatient rehabilitation facility       Therapist:   Thayer Dallas  Pager #: [phone]

## 2017-12-11 NOTE — Respiratory Therapy (Signed)
Patient tolerated ATC well for 6 hours  Placed back on vent to rest  Cough assist ordered  RT will continue to monitor

## 2017-12-11 NOTE — Respiratory Therapy (Signed)
VENTILATOR - SIMV(PRVC)PS / APV-SIMV QHS (2200) DiscontinueReschedule   Comments: For sigh breath per spine guidelines   Duration: Until Specified    Priority: Routine       Question Answer Comment   FIO2 (%) 30    Peep(cm/H2O) 10    Rate(bpm) 12    Tidal Volume(mls) 700    Pressure Support(cm/H2O) 14    Indications IMPROVE DISTRIBUTION OF VENTILATION            Patient rested on the vent overnight on the following settings with no issues. Patient did sleep well tonight. Patient tries to refuse suction and cough assist, but is reassured that it is needed to help keep her lungs open and makes it easier to breath. Will continue to monitor respiratory status. Will obtain FVC/NIF in the morning and place on ATC as the patient tolerates.

## 2017-12-11 NOTE — Care Management Notes (Signed)
12/11/17 1617   ADVANCE DIRECTIVES   Does the Patient have an Advance Directive? Yes, Patient Does Have Advance Directive for Healthcare Treatment   Type of Advance Directive Completed Medical Power of Attorney   Copy of Advance Directives in Chart? 0   Name of Royston Ellis///Kenneth [employer] Number of [place]: [phone] //////Kenneth: [phone]       MSW met with patient to provide education regarding the purpose and process of completing advance directives.  Pt selected to complete MPOA.  Original MPOA and copies given to patient (and/or 4 per patient request, if applicable).  Due to Pt's paraplegia witnessed X 2 and accepted by non-verbal and mouthing who she would like to be chosen.  Copy placed in patient chart.    Ellen Henri, SOCIAL WORKER  12/11/2017, 16:20

## 2017-12-11 NOTE — ACP (Advance Care Planning) (Signed)
12/11/17 1617   ADVANCE DIRECTIVES   Does the Patient have an Advance Directive? Yes, Patient Does Have Advance Directive for Healthcare Treatment   Type of Advance Directive Completed Medical Power of Attorney   Copy of Advance Directives in Chart? 0   Name of Christy Turner///Kenneth [employer] Number of [place]: [phone] //////Kenneth: [phone]       MSW met with patient to provide education regarding the purpose and process of completing advance directives.  Pt selected to complete MPOA.  Original MPOA and copies given to patient (and/or 4 per patient request, if applicable).  Due to Pt's paraplegia witnessed X 2 and accepted by non-verbal and mouthing who she would like to be chosen.  Copy placed in patient chart.    Christy Turner, SOCIAL WORKER  12/11/2017, 16:20

## 2017-12-12 ENCOUNTER — Inpatient Hospital Stay (HOSPITAL_COMMUNITY): Payer: Medicare HMO

## 2017-12-12 ENCOUNTER — Other Ambulatory Visit (INDEPENDENT_AMBULATORY_CARE_PROVIDER_SITE_OTHER): Payer: Self-pay | Admitting: Surgical

## 2017-12-12 ENCOUNTER — Encounter (HOSPITAL_COMMUNITY)
Admission: EM | Disposition: A | Discharge status: Other Acute Inpatient Hospital | Payer: Self-pay | Source: Home / Self Care | Attending: SURGICAL CRITICAL CARE

## 2017-12-12 ENCOUNTER — Encounter (HOSPITAL_COMMUNITY): Payer: Self-pay

## 2017-12-12 DIAGNOSIS — J90 Pleural effusion, not elsewhere classified: Secondary | ICD-10-CM

## 2017-12-12 DIAGNOSIS — Z930 Tracheostomy status: Secondary | ICD-10-CM

## 2017-12-12 DIAGNOSIS — Z981 Arthrodesis status: Secondary | ICD-10-CM

## 2017-12-12 DIAGNOSIS — S22008A Other fracture of unspecified thoracic vertebra, initial encounter for closed fracture: Secondary | ICD-10-CM

## 2017-12-12 DIAGNOSIS — J9811 Atelectasis: Secondary | ICD-10-CM

## 2017-12-12 DIAGNOSIS — J984 Other disorders of lung: Secondary | ICD-10-CM

## 2017-12-12 DIAGNOSIS — S2241XA Multiple fractures of ribs, right side, initial encounter for closed fracture: Secondary | ICD-10-CM

## 2017-12-12 DIAGNOSIS — S14109A Unspecified injury at unspecified level of cervical spinal cord, initial encounter: Secondary | ICD-10-CM

## 2017-12-12 DIAGNOSIS — Z950 Presence of cardiac pacemaker: Secondary | ICD-10-CM

## 2017-12-12 LAB — POC BLOOD GLUCOSE (RESULTS)
GLUCOSE, POC: 101 mg/dL (ref 70–105)
GLUCOSE, POC: 118 mg/dL — ABNORMAL HIGH (ref 70–105)
GLUCOSE, POC: 142 mg/dL — ABNORMAL HIGH (ref 70–105)
GLUCOSE, POC: 93 mg/dL (ref 70–105)

## 2017-12-12 LAB — PHOSPHORUS: PHOSPHORUS: 4.1 mg/dL — ABNORMAL HIGH (ref 2.3–4.0)

## 2017-12-12 LAB — BASIC METABOLIC PANEL
ANION GAP: 7 mmol/L (ref 4–13)
BUN/CREA RATIO: 26 — ABNORMAL HIGH (ref 6–22)
BUN: 14 mg/dL (ref 8–25)
CALCIUM: 7.2 mg/dL — ABNORMAL LOW (ref 8.5–10.2)
CHLORIDE: 105 mmol/L (ref 96–111)
CO2 TOTAL: 26 mmol/L (ref 22–32)
CREATININE: 0.54 mg/dL (ref 0.49–1.10)
ESTIMATED GFR: >59 mL/min/{1.73_m2} (ref >59)
GLUCOSE: 98 mg/dL (ref 65–139)
POTASSIUM: 5 mmol/L (ref 3.5–5.1)
SODIUM: 138 mmol/L (ref 136–145)

## 2017-12-12 LAB — CBC
HCT: 25.1 % — ABNORMAL LOW (ref 34.8–46.0)
HGB: 8 g/dL — ABNORMAL LOW (ref 11.5–16.0)
MCH: 29.5 pg (ref 26.0–32.0)
MCHC: 31.9 g/dL (ref 31.0–35.5)
MCV: 92.6 fL (ref 78.0–100.0)
MPV: 11.3 fL (ref 8.7–12.5)
PLATELETS: 215 10*3/uL (ref 150–400)
RBC: 2.71 10*6/uL — ABNORMAL LOW (ref 3.85–5.22)
RDW-CV: 16.7 % — ABNORMAL HIGH (ref 11.5–15.5)
RDW-CV: 16.7 % — ABNORMAL HIGH (ref 11.5–15.5)
WBC: 10.3 10*3/uL (ref 3.7–11.0)

## 2017-12-12 LAB — ADULT ROUTINE BLOOD CULTURE, SET OF 2 BOTTLES (BACTERIA AND YEAST)
BLOOD CULTURE, ROUTINE: NO GROWTH
BLOOD CULTURE, ROUTINE: NO GROWTH

## 2017-12-12 LAB — MAGNESIUM: MAGNESIUM: 2.2 mg/dL (ref 1.6–2.6)

## 2017-12-12 SURGERY — INSERTION DIAPHRAGMATIC PACEMAKER LAPAROSCOPIC
Anesthesia: General | Site: Abdomen

## 2017-12-12 MED ORDER — PSEUDOEPHEDRINE 30 MG TABLET
90.00 mg | ORAL_TABLET | Freq: Four times a day (QID) | ORAL | Status: DC
Start: 2017-12-12 — End: 2017-12-13
  Administered 2017-12-12 – 2017-12-13 (×3): 90 mg via GASTROSTOMY
  Filled 2017-12-12: qty 3
  Filled 2017-12-12 (×4): qty 1

## 2017-12-12 MED ADMIN — sodium chloride 0.9 % intravenous solution: INTRAVENOUS | @ 01:00:00 | NDC 00338004904

## 2017-12-12 MED ADMIN — senna leaf extract 176 mg/5 mL oral syrup: GASTROSTOMY | @ 08:00:00

## 2017-12-12 MED ADMIN — ciprofloxacin 400 mg/200 mL in 5 % dextrose intravenous piggyback: INTRAVENOUS | @ 15:00:00

## 2017-12-12 MED ADMIN — ibuprofen 800 mg tablet: RECTAL | @ 08:00:00

## 2017-12-12 MED ADMIN — LORazepam 1 mg tablet: @ 21:00:00

## 2017-12-12 MED ADMIN — sodium chloride 0.45 % intravenous solution: TRANSDERMAL | @ 08:00:00 | NDC 00338004304
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
## 2017-12-12 NOTE — Care Plan (Signed)
[facility]  Rehabilitation Services  Physical Therapy Progress Note      Patient Name: Christy Turner  Date of Birth: [birth year]  Height:  163.8 cm (5' 4.5")  Weight:  103.9 kg (229 lb 0.9 oz)  Room/Bed: 11/A  Payor: OTHER AUTO / Plan: OTHER AUTO / Product Type: Auto /     Assessment:     Patient was placed in seated position with bed. Limited to approximately 60 degrees with upper bed. Tolerated for 10 minutes before having nausea.     Discharge Needs:   Equipment Recommendation: TBD    Discharge Disposition: inpatient rehabilitation facility    JUSTIFICATION OF DISCHARGE RECOMMENDATION   Based on current diagnosis, functional performance prior to admission, and current functional performance, this patient requires continued PT services in inpatient rehabilitation facility in order to achieve significant functional improvements in these deficit areas: gait, locomotion, and balance, muscle performance, neuromuscular.      Plan:   Continue to follow patient according to established plan of care.  The risks/benefits of therapy have been discussed with the patient/caregiver and he/she is in agreement with the established plan of care.     Subjective & Objective:        12/12/17 1522   Therapist Pager   PT Assigned/ Pager # steve [phone]   Rehab Session   Document Type therapy progress note (daily note)   Total PT Minutes: 32   Patient Effort good   Symptoms Noted During/After Treatment fatigue   General Information   Patient Profile Reviewed? yes   Medical Lines Telemetry;PIV Line   Respiratory Status ventilator   Mutuality/Individual Preferences   Individualized Care Needs OOB with maxi move   Pre Treatment Status   Pre Treatment Patient Status Patient supine in bed;Call light within reach;Telephone within reach;Nurse approved session   Support Present Pre Treatment  Family present   Communication Pre Treatment  Nurse   Pain Assessment   Pre/Post Treatment Pain Comment C/O contiued pain, stated increased  pain with sitting EOB   Bed Mobility Assessment/Treatment   Comment Bed placed to seated position. Tolerated 10 min before C/O nausea.   Therapeutic Exercise/Activity   Comment PROM to LE's   Post Treatment Status   Post Treatment Patient Status Patient supine in bed;Call light within reach   Support Present Post Treatment  None   Communication Post Treatement Nurse   Plan of Care Review   Plan Of Care Reviewed With patient   Physical Therapy Clinical Impression   Assessment Patient was placed in seated position with bed. Limited to approximately 60 degrees with upper bed. Tolerated for 10 minutes before having nausea.    Anticipated Equipment Needs at Discharge (PT) TBD   Anticipated Discharge Disposition inpatient rehabilitation facility   Planned Therapy Interventions, PT Eval   Planned Therapy Interventions (PT) balance training;bed mobility training;transfer training       Therapist:   Berneice Gandy, PT   Pager #: [phone]
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
## 2017-12-12 NOTE — Care Plan (Signed)
[place]  Occupational Therapy Progress Note    Patient Name: Christy Turner  Date of Birth: Oct 28, 1945  Height:  163.8 cm (5' 4.5")  Weight:  103.9 kg (229 lb 0.9 oz)  Room/Bed: 11/A  Payor: OTHER AUTO / Plan: OTHER AUTO / Product Type: Auto /     Assessment:    Patient tolerates session fairly.  Patient with increased pain this date and requires motivation to participate.  Patient will continue to be followed while in acute care.  LTACH vs. Inpatient Rehabilitation depending on respiratory status.      Discharge Needs:   Equipment Recommendation: to be determined    Discharge Disposition:  inpatient rehabilitation facility     JUSTIFICATION OF DISCHARGE RECOMMENDATION   Based on current diagnosis, functional performance prior to admission, and current functional performance, this patient requires continued OT services in inpatient rehabilitation facility in order to achieve significant functional improvements.    Plan:   Continue to follow patient according to established plan of care.  The risks/benefits of therapy have been discussed with the patient/caregiver and he/she is in agreement with the established plan of care.     Subjective & Objective:        12/12/17 1523   Therapist Pager   OT Assigned/ Pager # [place] [phone]   Rehab Session   Document Type therapy progress note (daily note)   Total OT Minutes: 32   Patient Effort good   Symptoms Noted During/After Treatment fatigue   General Information   Patient Profile Reviewed? yes   Medical Lines Telemetry;PIV Line   Respiratory Status ventilator   Pre Treatment Status   Pre Treatment Patient Status Patient supine in bed;Call light within reach;Telephone within reach   Support Present Pre Treatment  Family present   Communication Pre Treatment  Nurse   Mutuality/Individual Preferences   Individualized Care Needs OOB via maximove   Pain Assessment   Pre/Post Treatment Pain Comment Complains of continued pain      Coping/Psychosocial Response Interventions   Plan Of Care Reviewed With patient   Bed Mobility Assessment/Treatment   Comment bed placed in bed to chair to increase sitting tolerance and ability to be upright.  Patient educated on universal cuff and use of fork.  UE ROM completed.  Patient with increased edema in bilateral UEs.    Post Treatment Status   Post Treatment Patient Status Patient supine in bed;Call light within reach   Support Present Post Treatment  None   Communication Post Treatement Nurse   Occupational Therapy Clinical Impression   Functional Level at Time of Session Patient tolerates session fairly.  Patient with increased pain this date and requires motivation to participate.  Patient will continue to be followed while in acute care.  LTACH vs. Inpatient Rehabilitation depending on respiratory status.   Criteria for Skilled Therapeutic Interventions Met (OT) yes;meets criteria;skilled treatment is necessary   Rehab Potential good, to achieve stated therapy goals   Therapy Frequency 7x/week (2 weeks-SCI Protocol)   Predicted Duration of Therapy until discharge   Anticipated Equipment Needs at Discharge to be determined   Anticipated Discharge Disposition inpatient rehabilitation facility       Therapist:   Lucky Cowboy, OT  Pager #: [phone]

## 2017-12-12 NOTE — Care Plan (Signed)
Problem: Communication Impairment (Mechanical Ventilation, Invasive)  Goal: Effective Communication  Outcome: Ongoing (see interventions/notes)     Problem: Device-Related Complication Risk (Mechanical Ventilation, Invasive)  Goal: Optimal Device Function  Outcome: Ongoing (see interventions/notes)     Problem: Inability to Wean (Mechanical Ventilation, Invasive)  Goal: Mechanical Ventilation Liberation  Outcome: Ongoing (see interventions/notes)     Problem: Nutrition Impairment (Mechanical Ventilation, Invasive)  Goal: Optimal Nutrition Delivery  Outcome: Ongoing (see interventions/notes)     Problem: Skin and Tissue Injury (Mechanical Ventilation, Invasive)  Goal: Absence of Device-Related Skin and Tissue Injury  Outcome: Ongoing (see interventions/notes)     Problem: Ventilator-Induced Lung Injury (Mechanical Ventilation, Invasive)  Goal: Absence of Ventilator-Induced Lung Injury  Outcome: Ongoing (see interventions/notes)     Problem: Skin Injury Risk Increased  Goal: Skin Health and Integrity  Outcome: Ongoing (see interventions/notes)     Problem: Adult Inpatient Plan of Care  Goal: Plan of Care Review  Outcome: Ongoing (see interventions/notes)  Goal: Patient-Specific Goal (Individualization)  Outcome: Ongoing (see interventions/notes)  Goal: Absence of Hospital-Acquired Illness or Injury  Outcome: Ongoing (see interventions/notes)  Goal: Optimal Comfort and Wellbeing  Outcome: Ongoing (see interventions/notes)  Goal: Rounds/Family Conference  Outcome: Ongoing (see interventions/notes)     Problem: Fall Injury Risk  Goal: Absence of Fall and Fall-Related Injury  Outcome: Ongoing (see interventions/notes)     Problem: Acute Rehab Services Goal & Intervention Plan  Goal: Bathing Goal  Description  Stand Alone Therapy Goal  Outcome: Ongoing (see interventions/notes)  Goal: Bed Mobility Goal  Description  Stand Alone Therapy Goal  Outcome: Ongoing (see interventions/notes)  Goal: Caregiver Training  Goal  Description  Stand Alone Therapy Goal  Outcome: Ongoing (see interventions/notes)  Goal: Cognition Goal  Description  Stand Alone Therapy Goal  Outcome: Ongoing (see interventions/notes)  Goal: Cognition Goals, SLP  Description  Stand Alone Therapy Goal  Outcome: Ongoing (see interventions/notes)  Goal: Communication Goals, SLP  Description  Stand Alone Therapy Goal  Outcome: Ongoing (see interventions/notes)  Goal: Dysphagia Goals, SLP  Description  Stand Alone Therapy Goal  Outcome: Ongoing (see interventions/notes)  Goal: Eating Self-Feeding Goal  Description  Stand Alone Therapy Goal  Outcome: Ongoing (see interventions/notes)  Goal: Gait Training Goal  Description  Stand Alone Therapy Goal  Outcome: Ongoing (see interventions/notes)  Goal: Grooming Goal  Description  Stand Alone Therapy Goal  Outcome: Ongoing (see interventions/notes)  Goal: Home Management Goal  Description  Stand Alone Therapy Goal  Outcome: Ongoing (see interventions/notes)  Goal: Interprofessional Goal  Description  Stand Alone Therapy Goal  Outcome: Ongoing (see interventions/notes)  Goal: LB Dressing Goal  Description  Stand Alone Therapy Goal  Outcome: Ongoing (see interventions/notes)  Goal: Occupational Therapy Goals  Description  Stand Alone Therapy Goal  Outcome: Ongoing (see interventions/notes)  Goal: Physical Therapy Goal  Description  Stand Alone Therapy Goal  Outcome: Ongoing (see interventions/notes)  Goal: Range of Motion Goal  Description  Stand Alone Therapy Goal  Outcome: Ongoing (see interventions/notes)  Goal: Strength Goal  Description  Stand Alone Therapy Goal  Outcome: Ongoing (see interventions/notes)  Goal: Toileting Goal  Description  Stand Alone Therapy Goal  Outcome: Ongoing (see interventions/notes)  Goal: Goal Transfer Training  Description  Stand Alone Therapy Goal  Outcome: Ongoing (see interventions/notes)  Goal: UB Dressing Goal  Description  Stand Alone Therapy Goal  Outcome: Ongoing (see  interventions/notes)     Problem: Acute Rehab Services Goal & Intervention Plan  Goal: Bathing Goal  Description  Stand  Alone Therapy Goal  Outcome: Ongoing (see interventions/notes)  Goal: Bed Mobility Goal  Description  Stand Alone Therapy Goal  Outcome: Ongoing (see interventions/notes)  Goal: Caregiver Training Goal  Description  Stand Alone Therapy Goal  Outcome: Ongoing (see interventions/notes)  Goal: Cognition Goal  Description  Stand Alone Therapy Goal  Outcome: Ongoing (see interventions/notes)  Goal: Cognition Goals, SLP  Description  Stand Alone Therapy Goal  Outcome: Ongoing (see interventions/notes)  Goal: Communication Goals, SLP  Description  Stand Alone Therapy Goal  Outcome: Ongoing (see interventions/notes)  Goal: Dysphagia Goals, SLP  Description  Stand Alone Therapy Goal  Outcome: Ongoing (see interventions/notes)  Goal: Eating Self-Feeding Goal  Description  Stand Alone Therapy Goal  Outcome: Ongoing (see interventions/notes)  Goal: Gait Training Goal  Description  Stand Alone Therapy Goal  Outcome: Ongoing (see interventions/notes)  Goal: Grooming Goal  Description  Stand Alone Therapy Goal  Outcome: Ongoing (see interventions/notes)  Goal: Home Management Goal  Description  Stand Alone Therapy Goal  Outcome: Ongoing (see interventions/notes)  Goal: Interprofessional Goal  Description  Stand Alone Therapy Goal  Outcome: Ongoing (see interventions/notes)  Goal: LB Dressing Goal  Description  Stand Alone Therapy Goal  Outcome: Ongoing (see interventions/notes)  Goal: Occupational Therapy Goals  Description  Stand Alone Therapy Goal  Outcome: Ongoing (see interventions/notes)  Goal: Physical Therapy Goal  Description  Stand Alone Therapy Goal  Outcome: Ongoing (see interventions/notes)  Goal: Range of Motion Goal  Description  Stand Alone Therapy Goal  Outcome: Ongoing (see interventions/notes)  Goal: Strength Goal  Description  Stand Alone Therapy Goal  Outcome: Ongoing (see  interventions/notes)  Goal: Toileting Goal  Description  Stand Alone Therapy Goal  Outcome: Ongoing (see interventions/notes)  Goal: Goal Transfer Training  Description  Stand Alone Therapy Goal  Outcome: Ongoing (see interventions/notes)  Goal: UB Dressing Goal  Description  Stand Alone Therapy Goal  Outcome: Ongoing (see interventions/notes)     Problem: Adjustment to Injury (Multiple Trauma)  Goal: Optimal Coping with Effects of Injury  Outcome: Ongoing (see interventions/notes)     Problem: Bleeding (Multiple Trauma)  Goal: Absence of Bleeding  Outcome: Ongoing (see interventions/notes)     Problem: Functional Ability Impaired (Multiple Trauma)  Goal: Optimal Functional Ability  Outcome: Ongoing (see interventions/notes)     Problem: Neurovascular Compromise (Multiple Trauma)  Goal: Effective Peripheral Tissue Perfusion  Outcome: Ongoing (see interventions/notes)     Problem: Pain (Multiple Trauma)  Goal: Acceptable Pain Control  Outcome: Ongoing (see interventions/notes)     Problem: Respiratory Compromise (Multiple Trauma)  Goal: Effective Oxygenation and Ventilation  Outcome: Ongoing (see interventions/notes)       Patient still trached and mechanically ventilated, Q6 ATC trials throughout daytime, Vent Q6, having (2) sets of each thoughout daytime and then potentially lengthen trials tomorrow if patient tolerates well from Q6 to 7 or 8.    BIPAP From 0700 to 1100 From 7a-11a    Discontinue  Reschedule   Comments: Daily schedule should be:   6hrs ATC in AM   6hr BiPAP   6hrs ATC   Vent overnight   Duration: 2 Days    Priority: Routine       Process Instructions: Full Face Bipap/CPAP  orders require a separate nursing order for either "Naso-gastric Tube to suction" or an order for "No Naso-gastric Tube  Required".  Please enter another order for you preference seperately.            Patient's with continuous bipap orders should be  re-evaluated DAILY to validate their continued need of this therapy.             Please also consider if patient's Bipap frequency is to allow for off time from the bipap that they may need a supplemental oxygen order during that time. Please place a separate order for the type of oxygen needed.            Patients with "CONTINUOUS" selected as a frequency should have an NPO order.                    References: [facility] CPAP/BiPAP Adjustment Protocol   Question Answer Comment   Delivery Mode FULL FACE    FIO2 (%) 92    IPAP level (cm/H2O) 10    EPAP level (cm/H2O) 5    Indications OTHER (Specify in Comments)        12/12/17 1110    12/12/17 2200  VENTILATOR - SIMV(PRVC)PS / APV-SIMV QHS (2200)    Discontinue  Reschedule   Comments: Daily schedule should be:   6hrs ATC in AM   6hr BiPAP   6hrs ATC   Vent overnight     For sigh breath per spine guidelines   Duration: Until Specified    Priority: Routine       Question Answer Comment   FIO2 (%) 30    Peep(cm/H2O) 10    Rate(bpm) 12    Tidal Volume(mls) 700    Pressure Support(cm/H2O) 14    Indications IMPROVE DISTRIBUTION OF VENTILATION        12/12/17 1110   12/12/17 1900  OXYGEN - AEROSOL TRACH COLLAR CONTINUOUS    Discontinue   Comments: Daily schedule should be:   6hrs ATC in AM   6hr BiPAP   6hrs ATC   Vent overnight     FIO2 > 50% Will be set up with Double Flow O2   Duration: Until Specified    Priority: Routine       Question Answer Comment   FIO2 (%) 30    Indications for O2 OTHER (Specify in Comments)        12/12/17 1814   12/12/17 1130  MISC RESPIRATORY ORDER CONTINUOUS    Discontinue   Comments: ATC alternating with BiPAP q6 hrs, SIMV at night   Duration: Until Specified    Priority: Routine        12/12/17 1118   12/11/17 2000  RESPIRATORY CARE EVALUATION DAILY (2000)    Discontinue  Reschedule   Duration: 72 Hours    Priority: Routine       References: [facility] Respiratory Patient Assessment Protocol    [facility] Respiratory Medication Therapy Protocol    [facility] Respiratory Medication Therapy Algorithm   Question Answer Comment   Reason for Consult  ASSESS AND RECOMMEND Acute Spinal Cord Injury / Assessment of Cough Assist Device   Reason for Consult OTHER(Specify in Comments)        12/11/17 2909   12/07/17 0600  RESPIRATORY PARAMETERS (FVC+NIF) DAILY (0600)    Discontinue  Reschedule   Duration: Until Specified    Priority: Routine       Question Answer Comment   Indications ASSESSMENT OF PULMONARY STATUS    RT to notify MD if FVC < (L) 1L    RT to notify MD if NIF < (cm/H2O) -20 cm/H2O        12/06/17 1054   12/06/17 1400  COUGHALATOR - THERAPIST WILL DETERMINE THE PRESSURES USED TID (1000,1400,2200)  Discontinue  Reschedule   Duration: Until Specified    Priority: Routine       Question: Indication: Answer: MOBILIZE SECRETIONS    12/06/17 1057   12/06/17 0800  LUNG RECRUITMENT MANEUVER EVERY 6 HOURS    Discontinue  Reschedule   Comments: PRN   Duration: Until Specified    Priority: Routine

## 2017-12-12 NOTE — Nurses Notes (Signed)
[employer] rep came to bedside. Pt now has bone stimulator. Nursing instructed to keep on 24 hours a day. Change out battery daily. Change electrodes as needed. Pads to be placed on bilateral shoulder blades.

## 2017-12-12 NOTE — Ancillary Notes (Signed)
SBIRT    SBIRT not completed at this time due to patient's medical status -trached.  Pending recommendations:  Attempt to complete SBIRT at later time/date.    Jimmey Ralph, CT Clinical Therapist 12/12/2017, 08:49  Pager  [phone]

## 2017-12-12 NOTE — Consults (Signed)
[facility]  Spine Service  Attending: Taliana Mersereau  Progress Note  12/12/2017    Name: Christy Turner  DOB: 1945-04-23  MRN: [id number]    RECENT SURGERY:  12/02/2017 - C2-T2 PSF w/C3-C7 decompression     SUBJECTIVE:  72 y.o. female resting in bed. She continues to get up to chair twice a day. She had pain overnight.    OBJECTIVE:  BP 130/77   Pulse 79   Temp 37.2 C (99 F)   Resp 12   Ht 1.638 m (5' 4.5")   Wt 103.9 kg (229 lb 0.9 oz)   SpO2 100%   BMI 38.71 kg/m       GEN - NAD, resting in bed, tracheostomy in place  Non distended abdomen      Upper Extremity Motor    Left  Right      D  4  4  C5    WE  3  3  C6    Tri  0  0  C7    FF  0  0  C8    HI  0  0  T1            Fingers held in flexion.    Lower Extremity Motor    Left  Right      HF  0  0  L2    Q  0  0  L3    TA  0  0  L4    EHL  0  0  L5    G/S  1 0  S1            Sensory    Left  Right    C5 2 2   C6 2 2   C7 2 2   C8 1 1   T1 0 0   L2 0 0   L3 0 0   L4 0 0   L5 0 0   S1 0 0               Pertinent Labs:  CBC  (Last 48 hours)    Date/Time WBC HGB HCT MCV PLATELETS    12/12/17 0017 10.3    8.0 (L)    25.1 (L)    92.6    215       12/11/17 0017 10.2    8.8 (L)    27.3 (L)    90.4    263           BMP  (Last 48 hours)    Date/Time Na K Cl CO2 BUN CREAT Calcium Glucose    12/12/17 0016 138    5.0    105    26    14    0.54    7.2 (L)    98       12/11/17 0017 136    4.5    102    28    16    0.56    8.0 (L)    122             ASSESSMENT:  73 y.o. female  op s/p C2-T2 PSF w/ decompression C3-C7    PLAN:  - Weightbearing: WBAT  - PT/OT: recommend inpatient rehab  - DVT prophylaxis: per primary, on lovenox  - Antibiotics: zosyn per primary  - Dressing: change PRN per nursing  - Labs:  Hb 8.0 today.  - Dispo: Possible LTAC in  [place]  - Follow-up: will coordinate follow up with Dr. Herbie Saxon closer to discharge    Marlou Starks, MD  Resident, PGY-1  Department of Orthopaedics  Pager [phone]  12/12/2017 06:16    I saw her yesterday  on rounds   D B WE T FF HI  R 4+ 4+ 1 0 0 0  L 4+ 4+ 1 0 0 0  Bilat Gasroc 4-/5    - PT/OT  - Dispo: needs SCI rehab for patient and family education upon DC    [place], MD  12/12/2017, 14:01

## 2017-12-13 ENCOUNTER — Inpatient Hospital Stay (HOSPITAL_COMMUNITY): Payer: Medicare HMO | Admitting: Radiology

## 2017-12-13 DIAGNOSIS — J90 Pleural effusion, not elsewhere classified: Secondary | ICD-10-CM

## 2017-12-13 DIAGNOSIS — J9811 Atelectasis: Secondary | ICD-10-CM

## 2017-12-13 LAB — BASIC METABOLIC PANEL
ANION GAP: 6 mmol/L (ref 4–13)
BUN/CREA RATIO: 22 (ref 6–22)
BUN/CREA RATIO: 22 (ref 6–22)
BUN: 13 mg/dL (ref 8–25)
CALCIUM: 8 mg/dL — ABNORMAL LOW (ref 8.5–10.2)
CHLORIDE: 102 mmol/L (ref 96–111)
CO2 TOTAL: 29 mmol/L (ref 22–32)
CREATININE: 0.58 mg/dL (ref 0.49–1.10)
ESTIMATED GFR: >59 mL/min/{1.73_m2} (ref >59)
GLUCOSE: 122 mg/dL (ref 65–139)
POTASSIUM: 3.9 mmol/L (ref 3.5–5.1)
POTASSIUM: 3.9 mmol/L (ref 3.5–5.1)
SODIUM: 137 mmol/L (ref 136–145)

## 2017-12-13 LAB — POC BLOOD GLUCOSE (RESULTS)
GLUCOSE, POC: 131 mg/dL — ABNORMAL HIGH (ref 70–105)
GLUCOSE, POC: 111 mg/dL — ABNORMAL HIGH (ref 70–105)
GLUCOSE, POC: 111 mg/dl — ABNORMAL HIGH (ref 70–105)
GLUCOSE, POC: 115 mg/dL — ABNORMAL HIGH (ref 70–105)

## 2017-12-13 LAB — CBC
HCT: 27.1 % — ABNORMAL LOW (ref 34.8–46.0)
HGB: 8.6 g/dL — ABNORMAL LOW (ref 11.5–16.0)
MCH: 28.7 pg (ref 26.0–32.0)
MCHC: 31.7 g/dL (ref 31.0–35.5)
MCV: 90.3 fL (ref 78.0–100.0)
MPV: 10 fL (ref 8.7–12.5)
PLATELETS: 372 10*3/uL (ref 150–400)
RBC: 3 10*6/uL — ABNORMAL LOW (ref 3.85–5.22)
RDW-CV: 17.2 % — ABNORMAL HIGH (ref 11.5–15.5)
WBC: 8.2 10*3/uL (ref 3.7–11.0)

## 2017-12-13 LAB — PHOSPHORUS: PHOSPHORUS: 3.3 mg/dL (ref 2.3–4.0)

## 2017-12-13 LAB — ALBUMIN: ALBUMIN: 2 g/dL — ABNORMAL LOW (ref 3.4–4.8)

## 2017-12-13 LAB — MAGNESIUM: MAGNESIUM: 2.2 mg/dL (ref 1.6–2.6)

## 2017-12-13 MED ORDER — LANOLIN-OXYQUIN-PET, HYDROPHIL TOPICAL OINTMENT
TOPICAL_OINTMENT | Freq: Two times a day (BID) | CUTANEOUS | Status: DC | PRN
Start: 2017-12-13 — End: 2017-12-17
  Filled 2017-12-13: qty 1

## 2017-12-13 MED ORDER — METHOCARBAMOL 750 MG TABLET
750.00 mg | ORAL_TABLET | Freq: Four times a day (QID) | ORAL | Status: DC
Start: 2017-12-13 — End: 2017-12-14
  Administered 2017-12-13 – 2017-12-14 (×5): 750 mg via GASTROSTOMY
  Filled 2017-12-13 (×8): qty 1

## 2017-12-13 MED ORDER — ELECTROLYTE-A INTRAVENOUS SOLUTION
INTRAVENOUS | Status: DC
Start: 2017-12-13 — End: 2017-12-14

## 2017-12-13 MED ORDER — OXYCODONE 10 MG/0.5 ML ORAL SYRINGE (FOR ORAL USE ONLY)
10.00 mg | INJECTION | ORAL | Status: DC | PRN
Start: 2017-12-13 — End: 2017-12-17
  Administered 2017-12-13 – 2017-12-16 (×5): 10 mg via GASTROSTOMY
  Filled 2017-12-13 (×5): qty 0.5

## 2017-12-13 MED ORDER — OXYCODONE 10 MG/0.5 ML ORAL SYRINGE (FOR ORAL USE ONLY)
15.00 mg | INJECTION | ORAL | Status: DC | PRN
Start: 2017-12-13 — End: 2017-12-17
  Administered 2017-12-13 – 2017-12-17 (×15): 15 mg via GASTROSTOMY
  Filled 2017-12-13 (×15): qty 1

## 2017-12-13 MED ORDER — PSEUDOEPHEDRINE 30 MG TABLET
75.00 mg | ORAL_TABLET | Freq: Four times a day (QID) | ORAL | Status: DC
Start: 2017-12-13 — End: 2017-12-14
  Administered 2017-12-13 – 2017-12-14 (×4): 75 mg via GASTROSTOMY
  Filled 2017-12-13 (×4): qty 1

## 2017-12-13 MED ADMIN — sodium chloride 3 % for nebulization: RESPIRATORY_TRACT | @ 16:00:00

## 2017-12-13 MED ADMIN — DEXTROSE 10% IN WATER W/ ADDITIVES (HEPARIN 1000 UNITS/ML): GASTROSTOMY | NDC 00074564125

## 2017-12-13 MED ADMIN — lactated Ringers intravenous solution: INTRAVENOUS | @ 13:00:00 | NDC 00338011704

## 2017-12-13 MED ADMIN — lactated Ringers intravenous solution: ORAL | @ 09:00:00 | NDC 00338011704

## 2017-12-13 MED ADMIN — DEXTROSE 5% IN WATER W/ ADDITIVES: TRANSDERMAL | @ 09:00:00 | NDC 00264751000

## 2017-12-13 MED ADMIN — hydrocortisone 2.5 % topical cream with perineal applicator: GASTROSTOMY | @ 17:00:00 | NDC 69315030230
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
## 2017-12-13 NOTE — Care Plan (Signed)
Discussed with pt all of her available medications for pain after receiving a "10" on a 0 to 10 pain scale. Reminded pt to report pain before the pain reaches a severe level. Pt's pain subsided with use of PRN medications.     Problem: Communication Impairment (Mechanical Ventilation, Invasive)  Goal: Effective Communication  Outcome: Ongoing (see interventions/notes)     Problem: Device-Related Complication Risk (Mechanical Ventilation, Invasive)  Goal: Optimal Device Function  Outcome: Ongoing (see interventions/notes)     Problem: Inability to Wean (Mechanical Ventilation, Invasive)  Goal: Mechanical Ventilation Liberation  Outcome: Ongoing (see interventions/notes)     Problem: Nutrition Impairment (Mechanical Ventilation, Invasive)  Goal: Optimal Nutrition Delivery  Outcome: Ongoing (see interventions/notes)     Problem: Skin and Tissue Injury (Mechanical Ventilation, Invasive)  Goal: Absence of Device-Related Skin and Tissue Injury  Outcome: Ongoing (see interventions/notes)     Problem: Ventilator-Induced Lung Injury (Mechanical Ventilation, Invasive)  Goal: Absence of Ventilator-Induced Lung Injury  Outcome: Ongoing (see interventions/notes)     Problem: Skin Injury Risk Increased  Goal: Skin Health and Integrity  Outcome: Ongoing (see interventions/notes)     Problem: Adult Inpatient Plan of Care  Goal: Plan of Care Review  Outcome: Ongoing (see interventions/notes)  Goal: Patient-Specific Goal (Individualization)  Outcome: Ongoing (see interventions/notes)  Goal: Absence of Hospital-Acquired Illness or Injury  Outcome: Ongoing (see interventions/notes)  Goal: Optimal Comfort and Wellbeing  Outcome: Ongoing (see interventions/notes)  Goal: Rounds/Family Conference  Outcome: Ongoing (see interventions/notes)     Problem: Fall Injury Risk  Goal: Absence of Fall and Fall-Related Injury  Outcome: Ongoing (see interventions/notes)     Problem: Acute Rehab Services Goal & Intervention Plan  Goal: Bathing  Goal  Description  Stand Alone Therapy Goal  Outcome: Ongoing (see interventions/notes)  Goal: Bed Mobility Goal  Description  Stand Alone Therapy Goal  Outcome: Ongoing (see interventions/notes)  Goal: Caregiver Training Goal  Description  Stand Alone Therapy Goal  Outcome: Ongoing (see interventions/notes)  Goal: Cognition Goal  Description  Stand Alone Therapy Goal  Outcome: Ongoing (see interventions/notes)  Goal: Cognition Goals, SLP  Description  Stand Alone Therapy Goal  Outcome: Ongoing (see interventions/notes)  Goal: Communication Goals, SLP  Description  Stand Alone Therapy Goal  Outcome: Ongoing (see interventions/notes)  Goal: Dysphagia Goals, SLP  Description  Stand Alone Therapy Goal  Outcome: Ongoing (see interventions/notes)  Goal: Eating Self-Feeding Goal  Description  Stand Alone Therapy Goal  Outcome: Ongoing (see interventions/notes)  Goal: Gait Training Goal  Description  Stand Alone Therapy Goal  Outcome: Ongoing (see interventions/notes)  Goal: Grooming Goal  Description  Stand Alone Therapy Goal  Outcome: Ongoing (see interventions/notes)  Goal: Home Management Goal  Description  Stand Alone Therapy Goal  Outcome: Ongoing (see interventions/notes)  Goal: Interprofessional Goal  Description  Stand Alone Therapy Goal  Outcome: Ongoing (see interventions/notes)  Goal: LB Dressing Goal  Description  Stand Alone Therapy Goal  Outcome: Ongoing (see interventions/notes)  Goal: Occupational Therapy Goals  Description  Stand Alone Therapy Goal  Outcome: Ongoing (see interventions/notes)  Goal: Physical Therapy Goal  Description  Stand Alone Therapy Goal  Outcome: Ongoing (see interventions/notes)  Goal: Range of Motion Goal  Description  Stand Alone Therapy Goal  Outcome: Ongoing (see interventions/notes)  Goal: Strength Goal  Description  Stand Alone Therapy Goal  Outcome: Ongoing (see interventions/notes)  Goal: Toileting Goal  Description  Stand Alone Therapy Goal  Outcome: Ongoing (see  interventions/notes)  Goal: Goal Transfer Training  Description  Stand  Alone Therapy Goal  Outcome: Ongoing (see interventions/notes)  Goal: UB Dressing Goal  Description  Stand Alone Therapy Goal  Outcome: Ongoing (see interventions/notes)     Problem: Acute Rehab Services Goal & Intervention Plan  Goal: Bathing Goal  Description  Stand Alone Therapy Goal  Outcome: Ongoing (see interventions/notes)  Goal: Bed Mobility Goal  Description  Stand Alone Therapy Goal  Outcome: Ongoing (see interventions/notes)  Goal: Caregiver Training Goal  Description  Stand Alone Therapy Goal  Outcome: Ongoing (see interventions/notes)  Goal: Cognition Goal  Description  Stand Alone Therapy Goal  Outcome: Ongoing (see interventions/notes)  Goal: Cognition Goals, SLP  Description  Stand Alone Therapy Goal  Outcome: Ongoing (see interventions/notes)  Goal: Communication Goals, SLP  Description  Stand Alone Therapy Goal  Outcome: Ongoing (see interventions/notes)  Goal: Dysphagia Goals, SLP  Description  Stand Alone Therapy Goal  Outcome: Ongoing (see interventions/notes)  Goal: Eating Self-Feeding Goal  Description  Stand Alone Therapy Goal  Outcome: Ongoing (see interventions/notes)  Goal: Gait Training Goal  Description  Stand Alone Therapy Goal  Outcome: Ongoing (see interventions/notes)  Goal: Grooming Goal  Description  Stand Alone Therapy Goal  Outcome: Ongoing (see interventions/notes)  Goal: Home Management Goal  Description  Stand Alone Therapy Goal  Outcome: Ongoing (see interventions/notes)  Goal: Interprofessional Goal  Description  Stand Alone Therapy Goal  Outcome: Ongoing (see interventions/notes)  Goal: LB Dressing Goal  Description  Stand Alone Therapy Goal  Outcome: Ongoing (see interventions/notes)  Goal: Occupational Therapy Goals  Description  Stand Alone Therapy Goal  Outcome: Ongoing (see interventions/notes)  Goal: Physical Therapy Goal  Description  Stand Alone Therapy Goal  Outcome: Ongoing (see  interventions/notes)  Goal: Range of Motion Goal  Description  Stand Alone Therapy Goal  Outcome: Ongoing (see interventions/notes)  Goal: Strength Goal  Description  Stand Alone Therapy Goal  Outcome: Ongoing (see interventions/notes)  Goal: Toileting Goal  Description  Stand Alone Therapy Goal  Outcome: Ongoing (see interventions/notes)  Goal: Goal Transfer Training  Description  Stand Alone Therapy Goal  Outcome: Ongoing (see interventions/notes)  Goal: UB Dressing Goal  Description  Stand Alone Therapy Goal  Outcome: Ongoing (see interventions/notes)     Problem: Adjustment to Injury (Multiple Trauma)  Goal: Optimal Coping with Effects of Injury  Outcome: Ongoing (see interventions/notes)     Problem: Bleeding (Multiple Trauma)  Goal: Absence of Bleeding  Outcome: Ongoing (see interventions/notes)     Problem: Functional Ability Impaired (Multiple Trauma)  Goal: Optimal Functional Ability  Outcome: Ongoing (see interventions/notes)     Problem: Neurovascular Compromise (Multiple Trauma)  Goal: Effective Peripheral Tissue Perfusion  Outcome: Ongoing (see interventions/notes)     Problem: Pain (Multiple Trauma)  Goal: Acceptable Pain Control  Outcome: Ongoing (see interventions/notes)     Problem: Respiratory Compromise (Multiple Trauma)  Goal: Effective Oxygenation and Ventilation  Outcome: Ongoing (see interventions/notes)     Problem: Pain (Spinal Cord Injury)  Goal: Acceptable Pain Control  Outcome: Ongoing (see interventions/notes)  Note:   Discussed with pt all of her available medications for pain after receiving a "10" on a 0 to 10 pain scale. Reminded pt to report pain before the pain reaches a severe level. Pt's pain subsided with use of PRN medications.

## 2017-12-13 NOTE — Respiratory Therapy (Signed)
VENTILATOR - SIMV(PRVC)PS / APV-SIMV QHS (2200) DiscontinueReschedule   Comments: Daily schedule should be:   6hrs ATC in AM   6hr BiPAP   6hrs ATC   Vent overnight     For sigh breath per spine guidelines   Duration: Until Specified    Priority: Routine       Question Answer Comment   FIO2 (%) 30    Peep(cm/H2O) 10    Rate(bpm) 12    Tidal Volume(mls) 700    Pressure Support(cm/H2O) 10    Indications IMPROVE DISTRIBUTION OF VENTILATION        12/12/17 2232    12/13/17 0700  BIPAP From 0700 to 1100 From 7a-11a DiscontinueReschedule   Comments: Daily schedule should be:   6hrs ATC in AM   6hr BiPAP   6hrs ATC   Vent overnight   Duration: 2 Days    Priority: Routine       Process Instructions: Full Face Bipap/CPAP orders require a separate nursing order for either "Naso-gastric Tube to suction" or an order for "No Naso-gastric Tube Required". Please enter another order for you preference seperately.         Patient's with continuous bipap orders should be re-evaluated DAILY to validate their continued need of this therapy.         Please also consider if patient's Bipap frequency is to allow for off time from the bipap that they may need a supplemental oxygen order during that time. Please place a separate order for the type of oxygen needed.         Patients with "CONTINUOUS" selected as a frequency should have an NPO order.                References: [facility] CPAP/BiPAP Adjustment Protocol   Question Answer Comment   Delivery Mode FULL FACE    FIO2 (%) 92    IPAP level (cm/H2O) 10    EPAP level (cm/H2O) 5    Indications OTHER (Specify in Comments)        12/12/17 1110   12/12/17 1900  OXYGEN - AEROSOL TRACH COLLAR CONTINUOUS Discontinue   Comments: Daily schedule should be:   6hrs ATC in AM   6hr BiPAP   6hrs ATC   Vent overnight     FIO2 > 50% Will be set up with Double Flow O2   Duration: Until Specified    Priority: Routine       Question Answer Comment   FIO2 (%) 30    Indications for O2  OTHER (Specify in Comments)        12/12/17 1814   12/12/17 1130  MISC RESPIRATORY ORDER CONTINUOUS Discontinue   Comments: ATC alternating with BiPAP q6 hrs, SIMV at night   Duration: Until Specified    Priority: Routine        12/12/17 1118   12/11/17 2000  RESPIRATORY CARE EVALUATION DAILY (2000) DiscontinueReschedule   Duration: 72 Hours    Priority: Routine       References: [facility] Respiratory Patient Assessment ProtocolRMH Respiratory Medication Therapy ProtocolRMH Respiratory Medication Therapy Algorithm   Question Answer Comment   Reason for Consult ASSESS AND RECOMMEND Acute Spinal Cord Injury / Assessment of Cough Assist Device   Reason for Consult OTHER(Specify in Comments)        12/11/17 0937   12/07/17 0600  RESPIRATORY PARAMETERS (FVC+NIF) DAILY (0600) DiscontinueReschedule   Duration: Until Specified    Priority: Routine       Question Answer  Comment   Indications ASSESSMENT OF PULMONARY STATUS    RT to notify MD if FVC < (L) 1L    RT to notify MD if NIF < (cm/H2O) -20 cm/H2O        12/06/17 1054   12/06/17 1400  COUGHALATOR - THERAPIST WILL DETERMINE THE PRESSURES USED TID (1000,1400,2200) DiscontinueReschedule   Duration: Until Specified    Priority: Routine       Question: Indication: Answer: MOBILIZE SECRETIONS    12/06/17 1057   12/06/17 0800  LUNG RECRUITMENT MANEUVER EVERY 6 HOURS DiscontinueReschedule   Comments: PRN   Duration: Until Specified    Priority: Routine        12/06/17 0606

## 2017-12-13 NOTE — Nurses Notes (Signed)
Patient developing bilateral nonblanching areas on buttocks. Dr. Orson Aloe with SICU notified, wound care consult initiated. Q2 hour turns maintained.

## 2017-12-13 NOTE — Care Plan (Signed)
[facility]  Rehabilitation Services  Physical Therapy Progress Note      Patient Name: Christy Turner  Date of Birth: Aug 29, 1945  Height:  163.8 cm (5' 4.5")  Weight:  101 kg (222 lb 10.6 oz)  Room/Bed: 11/A  Payor: OTHER AUTO / Plan: OTHER AUTO / Product Type: Auto /     Assessment:     Patient sat up in bed with bed in chair position. C/O dizziness and nausea with transition. Minor change in BP but does have history of inner ear problems. Tolerated PROM of LE's.    Discharge Needs:   Equipment Recommendation: TBD      Discharge Disposition: inpatient rehabilitation facility, long term acute care facility    JUSTIFICATION OF DISCHARGE RECOMMENDATION   Based on current diagnosis, functional performance prior to admission, and current functional performance, this patient requires continued PT services in inpatient rehabilitation facility, long term acute care facility in order to achieve significant functional improvements in these deficit areas: gait, locomotion, and balance, muscle performance, neuromuscular.      Plan:   Continue to follow patient according to established plan of care.  The risks/benefits of therapy have been discussed with the patient/caregiver and he/she is in agreement with the established plan of care.     Subjective & Objective:        12/13/17 1119   Therapist Pager   PT Assigned/ Pager # steve [phone]   Rehab Session   Document Type therapy progress note (daily note)   Total PT Minutes: 45   Patient Effort good   Symptoms Noted During/After Treatment dizziness;nausea;fatigue   General Information   Patient Profile Reviewed? yes   Medical Lines Telemetry;PIV Line   Respiratory Status aerosol trach collar   Mutuality/Individual Preferences   Individualized Care Needs OOB with maxi move   Pre Treatment Status   Pre Treatment Patient Status Patient supine in bed;Call light within reach;Telephone within reach;Nurse approved session   Support Present Pre Treatment  Family present      Communication Pre Treatment  Nurse   Pain Assessment   Pre/Post Treatment Pain Comment C/O pain in back with movement.   Bed Mobility Assessment/Treatment   Supine-Sit Independence dependent (less than 25% patient effort)   Sit to Supine, Independence dependent (less than 25% patient effort)   Roll Left Independence dependent (less than 25% patient effort)   Roll Right Independence dependent (less than 25% patient effort)   Therapeutic Exercise/Activity   Comment PROM LE's   Post Treatment Status   Post Treatment Patient Status Patient supine in bed;Call light within reach;Telephone within reach   Support Present Post Treatment  Family present;Nurse present   Communication Post Treatement Nurse   Physical Therapy Clinical Impression   Assessment Patient sat up in bed with bed in chair position. C/O dizziness and nausea with transition. Minor change in BP but does have history of inner ear problems. Tolerated PROM of LE's.   Anticipated Equipment Needs at Discharge (PT) TBD   Anticipated Discharge Disposition inpatient rehabilitation facility;long term acute care facility   Planned Therapy Interventions, PT Eval   Planned Therapy Interventions (PT) balance training;bed mobility training;transfer training       Therapist:   Berneice Gandy, PT   Pager #: [phone]

## 2017-12-13 NOTE — Care Plan (Signed)
[place]  Occupational Therapy Progress Note    Patient Name: Christy Turner  Date of Birth: 1946-04-19  Height:  163.8 cm (5' 4.5")  Weight:  101 kg (222 lb 10.6 oz)  Room/Bed: 11/A  Payor: OTHER AUTO / Plan: OTHER AUTO / Product Type: Auto /     Assessment:    Patient tolerates session fairly and begins use of universal cuff for self feeding.  Patient responds well and is tolerating seated activity with less vital instability.  Patient will continue to be followed to maximize independence with self feeding and grooming tasks.  Patient most appropriate for LTACH vs. inpatient rehabilitation depending on respiratory status.        Discharge Needs:   Equipment Recommendation: to be determined    Discharge Disposition:  inpatient rehabilitation facility, long term acute care facility     JUSTIFICATION OF DISCHARGE RECOMMENDATION   Based on current diagnosis, functional performance prior to admission, and current functional performance, this patient requires continued OT services in inpatient rehabilitation facility, long term acute care facility in order to achieve significant functional improvements.    Plan:   Continue to follow patient according to established plan of care.  The risks/benefits of therapy have been discussed with the patient/caregiver and he/she is in agreement with the established plan of care.     Subjective & Objective:        12/13/17 1120   Therapist Pager   OT Assigned/ Pager # [phone]   Rehab Session   Document Type therapy progress note (daily note)   Total OT Minutes: 45   Patient Effort good   Symptoms Noted During/After Treatment dizziness;nausea;fatigue   General Information   Patient Profile Reviewed? yes   Medical Lines Telemetry;PIV Line   Respiratory Status aerosol trach collar   Pre Treatment Status   Pre Treatment Patient Status Patient supine in bed;Call light within reach;Telephone within reach;Nurse approved session   Support Present  Pre Treatment  Family present;Nurse present   Communication Pre Treatment  Nurse   Mutuality/Individual Preferences   Individualized Care Needs OOB with maximove   Coping/Psychosocial Response Interventions   Plan Of Care Reviewed With patient   Therapeutic Exercise/Activity   Comment Patient assisted from supine to bed to chair position.  Patient tolerates fairly with symptms of dizziness, pain, and nausea.  Patient has prior history of vertigo and inner ear fluid.  BP remained stable with only small decreases with positional change.  Patient educated on universal cuff for beginning self feeding.  Patient requires support at elbow and would benefit from a universal coff that supports wrist extension.  Patient setup with for upside down and bent, so only bicep flexion and gross downward movement in required.  Patient able to stab theraputty with for and bring to mouth with moderate assistance.  Patient still with no active tricep extension to begin working with other ADL functions along with multiple medical lines.  Patient positioned on side.   Post Treatment Status   Post Treatment Patient Status Patient supine in bed;Call light within reach;Telephone within reach   Support Present Post Treatment  Family present;Nurse present   Communication Post Treatement Nurse   Occupational Therapy Clinical Impression   Functional Level at Time of Session Patient tolerates session fairly and begins use of universal cuff for self feeding.  Patient responds well and is tolerating seated activity with less vital instability.  Patient will continue to be followed to maximize independence  with self feeding and grooming tasks.  Patient most appropriate for LTACH vs. inpatient rehabilitation depending on respiratory status.     Criteria for Skilled Therapeutic Interventions Met (OT) yes;meets criteria;skilled treatment is necessary   Rehab Potential good, to achieve stated therapy goals   Therapy Frequency 7x/week (2 weeks-SCI  Protocol)   Predicted Duration of Therapy until discharge   Anticipated Equipment Needs at Discharge to be determined   Anticipated Discharge Disposition inpatient rehabilitation facility;long term acute care facility       Therapist:   Lucky Cowboy, OT  Pager #: [phone]

## 2017-12-13 NOTE — Care Plan (Signed)
Pt is making progress towards d/c goal. Aspiration, reflux, skin, bleeding, and fall precautions maintained. Medications give per physicians orders. PUP bundle maintained. Patient worked with PT and OT today. Family at the bedside. Pt states feelings of depression. RN will continue to monitor and assess.

## 2017-12-13 NOTE — Nurses Notes (Signed)
Nursing notified by Dr. Orson Aloe with SICU that patient cleared to start bone stimulator therapy.

## 2017-12-13 NOTE — Care Plan (Signed)
Problem: Communication Impairment (Mechanical Ventilation, Invasive)  Goal: Effective Communication  Outcome: Ongoing (see interventions/notes)     Problem: Device-Related Complication Risk (Mechanical Ventilation, Invasive)  Goal: Optimal Device Function  Outcome: Ongoing (see interventions/notes)     Problem: Inability to Wean (Mechanical Ventilation, Invasive)  Goal: Mechanical Ventilation Liberation  Outcome: Ongoing (see interventions/notes)     Problem: Nutrition Impairment (Mechanical Ventilation, Invasive)  Goal: Optimal Nutrition Delivery  Outcome: Ongoing (see interventions/notes)     Problem: Skin and Tissue Injury (Mechanical Ventilation, Invasive)  Goal: Absence of Device-Related Skin and Tissue Injury  Outcome: Ongoing (see interventions/notes)     Problem: Ventilator-Induced Lung Injury (Mechanical Ventilation, Invasive)  Goal: Absence of Ventilator-Induced Lung Injury  Outcome: Ongoing (see interventions/notes)     Problem: Skin Injury Risk Increased  Goal: Skin Health and Integrity  Outcome: Ongoing (see interventions/notes)     Problem: Adult Inpatient Plan of Care  Goal: Plan of Care Review  Outcome: Ongoing (see interventions/notes)  Goal: Patient-Specific Goal (Individualization)  Outcome: Ongoing (see interventions/notes)  Goal: Absence of Hospital-Acquired Illness or Injury  Outcome: Ongoing (see interventions/notes)  Goal: Optimal Comfort and Wellbeing  Outcome: Ongoing (see interventions/notes)  Goal: Rounds/Family Conference  Outcome: Ongoing (see interventions/notes)     Problem: Fall Injury Risk  Goal: Absence of Fall and Fall-Related Injury  Outcome: Ongoing (see interventions/notes)     Problem: Acute Rehab Services Goal & Intervention Plan  Goal: Bathing Goal  Description  Stand Alone Therapy Goal  Outcome: Ongoing (see interventions/notes)  Goal: Bed Mobility Goal  Description  Stand Alone Therapy Goal  Outcome: Ongoing (see interventions/notes)  Goal: Caregiver Training  Goal  Description  Stand Alone Therapy Goal  Outcome: Ongoing (see interventions/notes)  Goal: Cognition Goal  Description  Stand Alone Therapy Goal  Outcome: Ongoing (see interventions/notes)  Goal: Cognition Goals, SLP  Description  Stand Alone Therapy Goal  Outcome: Ongoing (see interventions/notes)  Goal: Communication Goals, SLP  Description  Stand Alone Therapy Goal  Outcome: Ongoing (see interventions/notes)  Goal: Dysphagia Goals, SLP  Description  Stand Alone Therapy Goal  Outcome: Ongoing (see interventions/notes)  Goal: Eating Self-Feeding Goal  Description  Stand Alone Therapy Goal  Outcome: Ongoing (see interventions/notes)  Goal: Gait Training Goal  Description  Stand Alone Therapy Goal  Outcome: Ongoing (see interventions/notes)  Goal: Grooming Goal  Description  Stand Alone Therapy Goal  Outcome: Ongoing (see interventions/notes)  Goal: Home Management Goal  Description  Stand Alone Therapy Goal  Outcome: Ongoing (see interventions/notes)  Goal: Interprofessional Goal  Description  Stand Alone Therapy Goal  Outcome: Ongoing (see interventions/notes)  Goal: LB Dressing Goal  Description  Stand Alone Therapy Goal  Outcome: Ongoing (see interventions/notes)  Goal: Occupational Therapy Goals  Description  Stand Alone Therapy Goal  Outcome: Ongoing (see interventions/notes)  Goal: Physical Therapy Goal  Description  Stand Alone Therapy Goal  Outcome: Ongoing (see interventions/notes)  Goal: Range of Motion Goal  Description  Stand Alone Therapy Goal  Outcome: Ongoing (see interventions/notes)  Goal: Strength Goal  Description  Stand Alone Therapy Goal  Outcome: Ongoing (see interventions/notes)  Goal: Toileting Goal  Description  Stand Alone Therapy Goal  Outcome: Ongoing (see interventions/notes)  Goal: Goal Transfer Training  Description  Stand Alone Therapy Goal  Outcome: Ongoing (see interventions/notes)  Goal: UB Dressing Goal  Description  Stand Alone Therapy Goal  Outcome: Ongoing (see  interventions/notes)     Problem: Acute Rehab Services Goal & Intervention Plan  Goal: Bathing Goal  Description  Stand  Alone Therapy Goal  Outcome: Ongoing (see interventions/notes)  Goal: Bed Mobility Goal  Description  Stand Alone Therapy Goal  Outcome: Ongoing (see interventions/notes)  Goal: Caregiver Training Goal  Description  Stand Alone Therapy Goal  Outcome: Ongoing (see interventions/notes)  Goal: Cognition Goal  Description  Stand Alone Therapy Goal  Outcome: Ongoing (see interventions/notes)  Goal: Cognition Goals, SLP  Description  Stand Alone Therapy Goal  Outcome: Ongoing (see interventions/notes)  Goal: Communication Goals, SLP  Description  Stand Alone Therapy Goal  Outcome: Ongoing (see interventions/notes)  Goal: Dysphagia Goals, SLP  Description  Stand Alone Therapy Goal  Outcome: Ongoing (see interventions/notes)  Goal: Eating Self-Feeding Goal  Description  Stand Alone Therapy Goal  Outcome: Ongoing (see interventions/notes)  Goal: Gait Training Goal  Description  Stand Alone Therapy Goal  Outcome: Ongoing (see interventions/notes)  Goal: Grooming Goal  Description  Stand Alone Therapy Goal  Outcome: Ongoing (see interventions/notes)  Goal: Home Management Goal  Description  Stand Alone Therapy Goal  Outcome: Ongoing (see interventions/notes)  Goal: Interprofessional Goal  Description  Stand Alone Therapy Goal  Outcome: Ongoing (see interventions/notes)  Goal: LB Dressing Goal  Description  Stand Alone Therapy Goal  Outcome: Ongoing (see interventions/notes)  Goal: Occupational Therapy Goals  Description  Stand Alone Therapy Goal  Outcome: Ongoing (see interventions/notes)  Goal: Physical Therapy Goal  Description  Stand Alone Therapy Goal  Outcome: Ongoing (see interventions/notes)  Goal: Range of Motion Goal  Description  Stand Alone Therapy Goal  Outcome: Ongoing (see interventions/notes)  Goal: Strength Goal  Description  Stand Alone Therapy Goal  Outcome: Ongoing (see  interventions/notes)  Goal: Toileting Goal  Description  Stand Alone Therapy Goal  Outcome: Ongoing (see interventions/notes)  Goal: Goal Transfer Training  Description  Stand Alone Therapy Goal  Outcome: Ongoing (see interventions/notes)  Goal: UB Dressing Goal  Description  Stand Alone Therapy Goal  Outcome: Ongoing (see interventions/notes)     Problem: Adjustment to Injury (Multiple Trauma)  Goal: Optimal Coping with Effects of Injury  Outcome: Ongoing (see interventions/notes)     Problem: Bleeding (Multiple Trauma)  Goal: Absence of Bleeding  Outcome: Ongoing (see interventions/notes)     Problem: Functional Ability Impaired (Multiple Trauma)  Goal: Optimal Functional Ability  Outcome: Ongoing (see interventions/notes)     Problem: Neurovascular Compromise (Multiple Trauma)  Goal: Effective Peripheral Tissue Perfusion  Outcome: Ongoing (see interventions/notes)     Problem: Pain (Multiple Trauma)  Goal: Acceptable Pain Control  Outcome: Ongoing (see interventions/notes)     Problem: Respiratory Compromise (Multiple Trauma)  Goal: Effective Oxygenation and Ventilation  Outcome: Ongoing (see interventions/notes)       Patient still trached and mechanically ventilated along with ATC 6 hours on/ off with vent resting overnight with a rate of 12. Patient tolerating well at this time, continue to encourage patient to use coughalator even though patient is non compliant and extremely depressed and refuses frequently because of pain. Current settings are:    VENTILATOR - SIMV(PRVC)PS / APV-SIMV QHS (2200)    Discontinue  Reschedule   Comments: Daily schedule should be:   6hrs ATC in AM   6hr BiPAP   6hrs ATC   Vent overnight     For sigh breath per spine guidelines   Duration: Until Specified    Priority: Routine       Question Answer Comment   FIO2 (%) 30    Peep(cm/H2O) 10    Rate(bpm) 12    Tidal Volume(mls) 700  Pressure Support(cm/H2O) 10    Indications IMPROVE DISTRIBUTION OF VENTILATION        12/12/17 2232     12/13/17 2000  RESPIRATORY CARE EVALUATION DAILY (2000)    Discontinue  Reschedule   Duration: 72 Hours    Priority: Routine       References: [facility] Respiratory Patient Assessment Protocol    [facility] Respiratory Medication Therapy Protocol    [facility] Respiratory Medication Therapy Algorithm   Question Answer Comment   Reason for Consult ASSESS AND RECOMMEND Acute Spinal Cord Injury / Assessment of Cough Assist Device   Reason for Consult OTHER(Specify in Comments)        12/13/17 1605   12/13/17 0700  BIPAP From 0700 to 1100 From 7a-11a    Discontinue  Reschedule   Comments: Daily schedule should be:   6hrs ATC in AM   6hr BiPAP   6hrs ATC   Vent overnight   Duration: 2 Days    Priority: Routine       Process Instructions: Full Face Bipap/CPAP  orders require a separate nursing order for either "Naso-gastric Tube to suction" or an order for "No Naso-gastric Tube  Required".  Please enter another order for you preference seperately.            Patient's with continuous bipap orders should be re-evaluated DAILY to validate their continued need of this therapy.            Please also consider if patient's Bipap frequency is to allow for off time from the bipap that they may need a supplemental oxygen order during that time. Please place a separate order for the type of oxygen needed.            Patients with "CONTINUOUS" selected as a frequency should have an NPO order.                    References: [facility] CPAP/BiPAP Adjustment Protocol   Question Answer Comment   Delivery Mode FULL FACE    FIO2 (%) 92    IPAP level (cm/H2O) 10    EPAP level (cm/H2O) 5    Indications OTHER (Specify in Comments)        12/12/17 1110   12/12/17 1900  OXYGEN - AEROSOL TRACH COLLAR CONTINUOUS    Discontinue   Comments: Daily schedule should be:   6hrs ATC in AM   6hr BiPAP   6hrs ATC   Vent overnight     FIO2 > 50% Will be set up with Double Flow O2   Duration: Until Specified    Priority: Routine       Question Answer Comment   FIO2 (%) 30     Indications for O2 OTHER (Specify in Comments)        12/12/17 1814   12/12/17 1130  MISC RESPIRATORY ORDER CONTINUOUS    Discontinue   Comments: ATC alternating with BiPAP q6 hrs, SIMV at night   Duration: Until Specified    Priority: Routine        12/12/17 1118   12/07/17 0600  RESPIRATORY PARAMETERS (FVC+NIF) DAILY (0600)    Discontinue  Reschedule   Duration: Until Specified    Priority: Routine       Question Answer Comment   Indications ASSESSMENT OF PULMONARY STATUS    RT to notify MD if FVC < (L) 1L    RT to notify MD if NIF < (cm/H2O) -20 cm/H2O  12/06/17 1054   12/06/17 1400  COUGHALATOR - THERAPIST WILL DETERMINE THE PRESSURES USED TID (1000,1400,2200)    Discontinue  Reschedule   Duration: Until Specified    Priority: Routine       Question: Indication: Answer: MOBILIZE SECRETIONS    12/06/17 1057   12/06/17 0800  LUNG RECRUITMENT MANEUVER EVERY 6 HOURS    Discontinue  Reschedule   Comments: PRN   Duration: Until Specified    Priority: Routine        12/06/17 0606      MAR Note     Note Date and Time Entered User   [Cleared the note] 12/07/17 1945 Antonietta Jewel, RN   Respiratory Medications   (From admission, onward)   Start   Ordered Stop   12/07/17 1200  albuterol (PROVENTIL) 2.5mg / 0.5 mL nebulizer solution 2.5 mg, Nebulization, 3 TIMES DAILY     "And" Linked Group Details    12/07/17 1037 --   12/07/17 1200  sodium chloride (3% SALINE) nebulizer solution 3 mL, Nebulization, 3 TIMES DAILY     "And" Linked Group Details    12/07/17 1037 --

## 2017-12-14 ENCOUNTER — Inpatient Hospital Stay (HOSPITAL_COMMUNITY): Payer: Medicare HMO

## 2017-12-14 DIAGNOSIS — J9811 Atelectasis: Secondary | ICD-10-CM

## 2017-12-14 DIAGNOSIS — J90 Pleural effusion, not elsewhere classified: Secondary | ICD-10-CM

## 2017-12-14 LAB — BASIC METABOLIC PANEL
ANION GAP: 4 mmol/L (ref 4–13)
BUN/CREA RATIO: 22 (ref 6–22)
BUN: 11 mg/dL (ref 8–25)
CALCIUM: 7.1 mg/dL — ABNORMAL LOW (ref 8.5–10.2)
CALCIUM: 7.1 mg/dL — ABNORMAL LOW (ref 8.5–10.2)
CHLORIDE: 109 mmol/L (ref 96–111)
CO2 TOTAL: 26 mmol/L (ref 22–32)
CREATININE: 0.51 mg/dL (ref 0.49–1.10)
CREATININE: 0.51 mg/dL (ref 0.49–1.10)
ESTIMATED GFR: >59 mL/min/1.73mˆ2 (ref >59)
GLUCOSE: 100 mg/dL (ref 65–139)
POTASSIUM: 3.8 mmol/L (ref 3.5–5.1)
SODIUM: 139 mmol/L (ref 136–145)

## 2017-12-14 LAB — CBC
HCT: 25 % — ABNORMAL LOW (ref 34.8–46.0)
HGB: 7.9 g/dL — ABNORMAL LOW (ref 11.5–16.0)
MCH: 29.5 pg (ref 26.0–32.0)
MCH: 29.5 pg (ref 26.0–32.0)
MCHC: 31.6 g/dL (ref 31.0–35.5)
MCV: 93.3 fL (ref 78.0–100.0)
MPV: 10 fL (ref 8.7–12.5)
MPV: 10 fL (ref 8.7–12.5)
PLATELETS: 383 x10ˆ3/uL (ref 150–400)
RBC: 2.68 x10ˆ6/uL — ABNORMAL LOW (ref 3.85–5.22)
RDW-CV: 17.6 % — ABNORMAL HIGH (ref 11.5–15.5)
WBC: 7.3 x10ˆ3/uL (ref 3.7–11.0)

## 2017-12-14 LAB — POC BLOOD GLUCOSE (RESULTS)
GLUCOSE, POC: 106 mg/dL — ABNORMAL HIGH (ref 70–105)
GLUCOSE, POC: 110 mg/dL — ABNORMAL HIGH (ref 70–105)
GLUCOSE, POC: 65 mg/dL — ABNORMAL LOW (ref 70–105)
GLUCOSE, POC: 67 mg/dL — ABNORMAL LOW (ref 70–105)
GLUCOSE, POC: 67 mg/dl — ABNORMAL LOW (ref 70–105)
GLUCOSE, POC: 116 mg/dL — ABNORMAL HIGH (ref 70–105)

## 2017-12-14 LAB — MAGNESIUM: MAGNESIUM: 2 mg/dL (ref 1.6–2.6)

## 2017-12-14 LAB — PHOSPHORUS: PHOSPHORUS: 3.2 mg/dL (ref 2.3–4.0)

## 2017-12-14 MED ORDER — PSEUDOEPHEDRINE 60 MG TABLET
60.00 mg | ORAL_TABLET | Freq: Four times a day (QID) | ORAL | Status: DC
Start: 2017-12-14 — End: 2017-12-15
  Administered 2017-12-14 – 2017-12-15 (×4): 60 mg via GASTROSTOMY
  Filled 2017-12-14 (×4): qty 1

## 2017-12-14 MED ORDER — DEXTROSE 50 % IN WATER (D50W) INTRAVENOUS SYRINGE
12.5000 g | INJECTION | INTRAVENOUS | Status: AC
Start: 2017-12-14 — End: 2017-12-14

## 2017-12-14 MED ORDER — METHOCARBAMOL 500 MG TABLET
1000.00 mg | ORAL_TABLET | Freq: Four times a day (QID) | ORAL | Status: DC
Start: 2017-12-14 — End: 2017-12-17
  Administered 2017-12-14 – 2017-12-17 (×12): 1000 mg via GASTROSTOMY
  Filled 2017-12-14 (×15): qty 2

## 2017-12-14 MED ORDER — DEXTROSE 50 % IN WATER (D50W) INTRAVENOUS SYRINGE
INJECTION | INTRAVENOUS | Status: AC
Start: 2017-12-14 — End: 2017-12-14
  Administered 2017-12-14: 25 mL via INTRAVENOUS
  Filled 2017-12-14: qty 50

## 2017-12-14 MED ORDER — HYDROMORPHONE 2 MG/ML INJECTION SYRINGE
0.2000 mg | INJECTION | INTRAMUSCULAR | Status: AC
Start: 2017-12-14 — End: 2017-12-14

## 2017-12-14 MED ORDER — GABAPENTIN 300 MG CAPSULE
600.00 mg | ORAL_CAPSULE | Freq: Three times a day (TID) | ORAL | Status: DC
Start: 2017-12-14 — End: 2017-12-17
  Administered 2017-12-14 – 2017-12-17 (×10): 600 mg via GASTROSTOMY
  Filled 2017-12-14 (×9): qty 2

## 2017-12-14 MED ORDER — HYDROMORPHONE 2 MG/ML INJECTION SYRINGE
INJECTION | INTRAMUSCULAR | Status: AC
Start: 2017-12-14 — End: 2017-12-14
  Administered 2017-12-14: 0.2 mg via INTRAVENOUS
  Filled 2017-12-14: qty 1

## 2017-12-14 MED ADMIN — CEFAZOLIN IN NS IRRIGATION - OSM: GASTROSTOMY | @ 16:00:00 | NDC 25021010167

## 2017-12-14 MED ADMIN — DEXTROSE 10% E48 W/ ADDITIVES: GASTROSTOMY | @ 10:00:00

## 2017-12-14 MED ADMIN — lactated Ringers intravenous solution: GASTROSTOMY | @ 21:00:00 | NDC 00338011704

## 2017-12-14 MED ADMIN — chlorhexidine gluconate 0.12 % mouthwash: GASTROSTOMY | @ 08:00:00

## 2017-12-14 MED ADMIN — POTASSIUM CHLORIDE 20 MEQ WITH LIDOCAINE 1% IN NS 100ML IVPB: TRANSDERMAL | @ 08:00:00 | NDC 00409665318

## 2017-12-14 NOTE — Care Plan (Signed)
[facility]  Rehabilitation Services  Physical Therapy Progress Note      Patient Name: Christy Turner  Date of Birth: Oct 22, 1945  Height:  163.8 cm (5' 4.5")  Weight:  104.4 kg (230 lb 2.6 oz)  Room/Bed: 11/A  Payor: OTHER AUTO / Plan: OTHER AUTO / Product Type: Auto /     Assessment:     Patient was able to tolerate bed in chair position. Mild dizziness when first in position. Tolerated PROM to LE's.     Discharge Needs:   Equipment Recommendation: TBD      Discharge Disposition: inpatient rehabilitation facility, long term acute care facility    JUSTIFICATION OF DISCHARGE RECOMMENDATION   Based on current diagnosis, functional performance prior to admission, and current functional performance, this patient requires continued PT services in inpatient rehabilitation facility, long term acute care facility in order to achieve significant functional improvements in these deficit areas: gait, locomotion, and balance, muscle performance, neuromuscular.      Plan:   Continue to follow patient according to established plan of care.  The risks/benefits of therapy have been discussed with the patient/caregiver and he/she is in agreement with the established plan of care.     Subjective & Objective:        12/14/17 1201   Therapist Pager   PT Assigned/ Pager # [phone]   Rehab Session   Document Type therapy progress note (daily note)   Total PT Minutes: 45   Patient Effort good   Symptoms Noted During/After Treatment dizziness  (mild)   General Information   Medical Lines Telemetry;PIV Line   Respiratory Status room air   Mutuality/Individual Preferences   Individualized Care Needs OOB with maxi move   Pre Treatment Status   Pre Treatment Patient Status Patient supine in bed;Call light within reach;Telephone within reach;Nurse approved session   Support Present Pre Treatment  Family present   Communication Pre Treatment  Nurse   Pain Assessment   Pre/Post Treatment Pain Comment C/O pain with rolling   Bed  Mobility Assessment/Treatment   Supine-Sit Independence dependent (less than 25% patient effort)   Sit to Supine, Independence dependent (less than 25% patient effort)   Roll Left Independence dependent (less than 25% patient effort)   Roll Right Independence dependent (less than 25% patient effort)   Therapeutic Exercise/Activity   Comment Patient assisted to sit in bed in chair position. Tolerated with only mild dizziness. Tolerated PROM to LE's.   Post Treatment Status   Post Treatment Patient Status Call light within reach;Telephone within reach  (sitting with bed in chair position)   Support Present Post Treatment  Family present   Communication Post Treatement Nurse   Plan of Care Review   Plan Of Care Reviewed With patient   Physical Therapy Clinical Impression   Assessment Patient was able to tolerate bed in chair position. Mild dizziness when first in position. Tolerated PROM to LE's.    Anticipated Equipment Needs at Discharge (PT) TBD   Anticipated Discharge Disposition inpatient rehabilitation facility;long term acute care facility   Planned Therapy Interventions, PT Eval   Planned Therapy Interventions (PT) balance training;bed mobility training;transfer training       Therapist:   Berneice Gandy, PT   Pager #: [phone]

## 2017-12-14 NOTE — Nurses Notes (Signed)
Results for EMIYA, NORCUTT (MRN [id number]) as of 12/14/2017 19:47   Ref. Range 12/14/2017 19:17   GLUCOSE, POC Latest Ref Range: 70 - 105 mg/dl 65 (L)   FS <61. 1/2 amp D50 administered. Will recheck in 15 minutes.     1937: repeat fs 116. Will continue to closely monitor.

## 2017-12-14 NOTE — Care Plan (Signed)
Problem: Communication Impairment (Mechanical Ventilation, Invasive)  Goal: Effective Communication  Outcome: Ongoing (see interventions/notes)     Problem: Device-Related Complication Risk (Mechanical Ventilation, Invasive)  Goal: Optimal Device Function  Outcome: Ongoing (see interventions/notes)     Problem: Inability to Wean (Mechanical Ventilation, Invasive)  Goal: Mechanical Ventilation Liberation  Outcome: Ongoing (see interventions/notes)     Problem: Nutrition Impairment (Mechanical Ventilation, Invasive)  Goal: Optimal Nutrition Delivery  Outcome: Ongoing (see interventions/notes)     Problem: Skin and Tissue Injury (Mechanical Ventilation, Invasive)  Goal: Absence of Device-Related Skin and Tissue Injury  Outcome: Ongoing (see interventions/notes)     Problem: Ventilator-Induced Lung Injury (Mechanical Ventilation, Invasive)  Goal: Absence of Ventilator-Induced Lung Injury  Outcome: Ongoing (see interventions/notes)     Problem: Skin Injury Risk Increased  Goal: Skin Health and Integrity  Outcome: Ongoing (see interventions/notes)     Problem: Adult Inpatient Plan of Care  Goal: Plan of Care Review  Outcome: Ongoing (see interventions/notes)  Goal: Patient-Specific Goal (Individualization)  Outcome: Ongoing (see interventions/notes)  Goal: Absence of Hospital-Acquired Illness or Injury  Outcome: Ongoing (see interventions/notes)  Goal: Optimal Comfort and Wellbeing  Outcome: Ongoing (see interventions/notes)  Goal: Rounds/Family Conference  Outcome: Ongoing (see interventions/notes)     Problem: Fall Injury Risk  Goal: Absence of Fall and Fall-Related Injury  Outcome: Ongoing (see interventions/notes)     Problem: Acute Rehab Services Goal & Intervention Plan  Goal: Bathing Goal  Description  Stand Alone Therapy Goal  Outcome: Ongoing (see interventions/notes)  Goal: Bed Mobility Goal  Description  Stand Alone Therapy Goal  Outcome: Ongoing (see interventions/notes)  Goal: Caregiver Training  Goal  Description  Stand Alone Therapy Goal  Outcome: Ongoing (see interventions/notes)  Goal: Cognition Goal  Description  Stand Alone Therapy Goal  Outcome: Ongoing (see interventions/notes)  Goal: Cognition Goals, SLP  Description  Stand Alone Therapy Goal  Outcome: Ongoing (see interventions/notes)  Goal: Communication Goals, SLP  Description  Stand Alone Therapy Goal  Outcome: Ongoing (see interventions/notes)  Goal: Dysphagia Goals, SLP  Description  Stand Alone Therapy Goal  Outcome: Ongoing (see interventions/notes)  Goal: Eating Self-Feeding Goal  Description  Stand Alone Therapy Goal  Outcome: Ongoing (see interventions/notes)  Goal: Gait Training Goal  Description  Stand Alone Therapy Goal  Outcome: Ongoing (see interventions/notes)  Goal: Grooming Goal  Description  Stand Alone Therapy Goal  Outcome: Ongoing (see interventions/notes)  Goal: Home Management Goal  Description  Stand Alone Therapy Goal  Outcome: Ongoing (see interventions/notes)  Goal: Interprofessional Goal  Description  Stand Alone Therapy Goal  Outcome: Ongoing (see interventions/notes)  Goal: LB Dressing Goal  Description  Stand Alone Therapy Goal  Outcome: Ongoing (see interventions/notes)  Goal: Occupational Therapy Goals  Description  Stand Alone Therapy Goal  Outcome: Ongoing (see interventions/notes)  Goal: Physical Therapy Goal  Description  Stand Alone Therapy Goal  Outcome: Ongoing (see interventions/notes)  Goal: Range of Motion Goal  Description  Stand Alone Therapy Goal  Outcome: Ongoing (see interventions/notes)  Goal: Strength Goal  Description  Stand Alone Therapy Goal  Outcome: Ongoing (see interventions/notes)  Goal: Toileting Goal  Description  Stand Alone Therapy Goal  Outcome: Ongoing (see interventions/notes)  Goal: Goal Transfer Training  Description  Stand Alone Therapy Goal  Outcome: Ongoing (see interventions/notes)  Goal: UB Dressing Goal  Description  Stand Alone Therapy Goal  Outcome: Ongoing (see  interventions/notes)     Problem: Acute Rehab Services Goal & Intervention Plan  Goal: Bathing Goal  Description  Stand  Alone Therapy Goal  Outcome: Ongoing (see interventions/notes)  Goal: Bed Mobility Goal  Description  Stand Alone Therapy Goal  Outcome: Ongoing (see interventions/notes)  Goal: Caregiver Training Goal  Description  Stand Alone Therapy Goal  Outcome: Ongoing (see interventions/notes)  Goal: Cognition Goal  Description  Stand Alone Therapy Goal  Outcome: Ongoing (see interventions/notes)  Goal: Cognition Goals, SLP  Description  Stand Alone Therapy Goal  Outcome: Ongoing (see interventions/notes)  Goal: Communication Goals, SLP  Description  Stand Alone Therapy Goal  Outcome: Ongoing (see interventions/notes)  Goal: Dysphagia Goals, SLP  Description  Stand Alone Therapy Goal  Outcome: Ongoing (see interventions/notes)  Goal: Eating Self-Feeding Goal  Description  Stand Alone Therapy Goal  Outcome: Ongoing (see interventions/notes)  Goal: Gait Training Goal  Description  Stand Alone Therapy Goal  Outcome: Ongoing (see interventions/notes)  Goal: Grooming Goal  Description  Stand Alone Therapy Goal  Outcome: Ongoing (see interventions/notes)  Goal: Home Management Goal  Description  Stand Alone Therapy Goal  Outcome: Ongoing (see interventions/notes)  Goal: Interprofessional Goal  Description  Stand Alone Therapy Goal  Outcome: Ongoing (see interventions/notes)  Goal: LB Dressing Goal  Description  Stand Alone Therapy Goal  Outcome: Ongoing (see interventions/notes)  Goal: Occupational Therapy Goals  Description  Stand Alone Therapy Goal  Outcome: Ongoing (see interventions/notes)  Goal: Physical Therapy Goal  Description  Stand Alone Therapy Goal  Outcome: Ongoing (see interventions/notes)  Goal: Range of Motion Goal  Description  Stand Alone Therapy Goal  Outcome: Ongoing (see interventions/notes)  Goal: Strength Goal  Description  Stand Alone Therapy Goal  Outcome: Ongoing (see  interventions/notes)  Goal: Toileting Goal  Description  Stand Alone Therapy Goal  Outcome: Ongoing (see interventions/notes)  Goal: Goal Transfer Training  Description  Stand Alone Therapy Goal  Outcome: Ongoing (see interventions/notes)  Goal: UB Dressing Goal  Description  Stand Alone Therapy Goal  Outcome: Ongoing (see interventions/notes)     Problem: Adjustment to Injury (Multiple Trauma)  Goal: Optimal Coping with Effects of Injury  Outcome: Ongoing (see interventions/notes)     Problem: Bleeding (Multiple Trauma)  Goal: Absence of Bleeding  Outcome: Ongoing (see interventions/notes)     Problem: Functional Ability Impaired (Multiple Trauma)  Goal: Optimal Functional Ability  Outcome: Ongoing (see interventions/notes)     Problem: Neurovascular Compromise (Multiple Trauma)  Goal: Effective Peripheral Tissue Perfusion  Outcome: Ongoing (see interventions/notes)     Problem: Pain (Multiple Trauma)  Goal: Acceptable Pain Control  Outcome: Ongoing (see interventions/notes)     Problem: Respiratory Compromise (Multiple Trauma)  Goal: Effective Oxygenation and Ventilation  Outcome: Ongoing (see interventions/notes)     Patients ATC increased to 7 hours on and vent time during the day decreased to 4 hours and still rest on vent overnight on high tidal volumes of previous and rate of 12, continue aggressive pulmonary toilet and encourage patient not to refuse coughalator. Continue trending FVC/nif and LRM prn. Current settings are:    VENTILATOR - SIMV(PRVC)PS / APV-SIMV QHS (2200)    Discontinue  Reschedule   Comments: Daily schedule should be:   7hrs ATC in AM   4hr BiPAP   7hrs ATC   Vent overnight     For sigh breath per spine guidelines   Duration: Until Specified    Priority: Routine       Question Answer Comment   FIO2 (%) 30    Peep(cm/H2O) 10    Rate(bpm) 12    Tidal Volume(mls) 700    Pressure Support(cm/H2O)  10    Indications IMPROVE DISTRIBUTION OF VENTILATION        12/14/17 0936    12/14/17 0945  OXYGEN  - AEROSOL TRACH COLLAR CONTINUOUS    Discontinue   Comments: Daily schedule should be:   7hrs ATC in AM   4hr BiPAP   7hrs ATC   Vent overnight     FIO2 > 50% Will be set up with Double Flow O2   Duration: Until Specified    Priority: Routine       Question Answer Comment   FIO2 (%) 30    Indications for O2 OTHER (Specify in Comments)        12/14/17 0936   12/14/17 0945  MISC RESPIRATORY ORDER CONTINUOUS    Discontinue   Comments: Daily schedule should be:   7hrs ATC in AM   4hr BiPAP   7hrs ATC   Vent overnight   Duration: Until Specified    Priority: Routine        12/14/17 0936   12/13/17 2000  RESPIRATORY CARE EVALUATION DAILY (2000)    Discontinue  Reschedule   Duration: 72 Hours    Priority: Routine       References: [facility] Respiratory Patient Assessment Protocol    [facility] Respiratory Medication Therapy Protocol    [facility] Respiratory Medication Therapy Algorithm   Question Answer Comment   Reason for Consult ASSESS AND RECOMMEND Acute Spinal Cord Injury / Assessment of Cough Assist Device   Reason for Consult OTHER(Specify in Comments)        12/13/17 1605   12/13/17 0700  BIPAP From 0700 to 1100 From 7a-11a    Discontinue   Comments: Daily schedule should be:   6hrs ATC in AM   6hr BiPAP   6hrs ATC   Vent overnight   Expired   Duration: 2 Days    Priority: Routine       Process Instructions: Full Face Bipap/CPAP  orders require a separate nursing order for either "Naso-gastric Tube to suction" or an order for "No Naso-gastric Tube  Required".  Please enter another order for you preference seperately.            Patient's with continuous bipap orders should be re-evaluated DAILY to validate their continued need of this therapy.            Please also consider if patient's Bipap frequency is to allow for off time from the bipap that they may need a supplemental oxygen order during that time. Please place a separate order for the type of oxygen needed.            Patients with "CONTINUOUS" selected as a frequency should  have an NPO order.                    References: [facility] CPAP/BiPAP Adjustment Protocol   Question Answer Comment   Delivery Mode FULL FACE    FIO2 (%) 92    IPAP level (cm/H2O) 10    EPAP level (cm/H2O) 5    Indications OTHER (Specify in Comments)        12/12/17 1110   12/07/17 0600  RESPIRATORY PARAMETERS (FVC+NIF) DAILY (0600)    Discontinue  Reschedule   Duration: Until Specified    Priority: Routine       Question Answer Comment   Indications ASSESSMENT OF PULMONARY STATUS    RT to notify MD if FVC < (L) 1L    RT to notify MD if  NIF < (cm/H2O) -20 cm/H2O        12/06/17 1054   12/06/17 1400  COUGHALATOR - THERAPIST WILL DETERMINE THE PRESSURES USED TID (1000,1400,2200)    Discontinue  Reschedule   Duration: Until Specified    Priority: Routine       Question: Indication: Answer: MOBILIZE SECRETIONS    12/06/17 1057   12/06/17 0800  LUNG RECRUITMENT MANEUVER EVERY 6 HOURS    Discontinue  Reschedule   Comments: PRN   Duration: Until Specified    Priority: Routine        12/06/17 0606      MAR Note     Note Date and Time Entered User   [Cleared the note] 12/07/17 1945 Antonietta Jewel, RN   Respiratory Medications   (From admission, onward)   Start   Ordered Stop   12/07/17 1200  albuterol (PROVENTIL) 2.5mg / 0.5 mL nebulizer solution 2.5 mg, Nebulization, 3 TIMES DAILY     "And" Linked Group Details    12/07/17 1037 --   12/07/17 1200  sodium chloride (3% SALINE) nebulizer solution 3 mL, Nebulization, 3 TIMES DAILY     "And" Linked Group Details
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
## 2017-12-14 NOTE — Ancillary Notes (Signed)
SBIRT    SBIRT not completed at this time due to patient's medical status -trached.  Pending recommendations:  Attempt to complete SBIRT at later time/date.    Garlan Fair, [facility] Clinical Therapist 12/14/2017, 08:57  Pager  [phone]

## 2017-12-14 NOTE — Care Plan (Signed)
VENTILATOR - SIMV(PRVC)PS / APV-SIMV QHS (2200)    Discontinue  Reschedule   Comments: Daily schedule should be:   6hrs ATC in AM   6hr BiPAP   6hrs ATC   Vent overnight     For sigh breath per spine guidelines   Duration: Until Specified    Priority: Routine       Question Answer Comment   FIO2 (%) 30    Peep(cm/H2O) 10    Rate(bpm) 12    Tidal Volume(mls) 700    Pressure Support(cm/H2O) 10          Patient 6 hours on vent 6 hours on ATC as long as patient tolerates the 6/6.
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
## 2017-12-14 NOTE — Nurses Notes (Signed)
Patient's 0800 and 1200 bladder volume >500. SICU APRN Raynelle Fanning) notified. Stated she would discuss with Dr. Terrilee Croak regarding high output of bladder volume. MIVF stopped at 0915. Will continue to closely monitor

## 2017-12-15 ENCOUNTER — Encounter (HOSPITAL_COMMUNITY): Payer: Self-pay | Admitting: Family

## 2017-12-15 ENCOUNTER — Inpatient Hospital Stay (HOSPITAL_COMMUNITY): Payer: Medicare HMO

## 2017-12-15 DIAGNOSIS — S2241XA Multiple fractures of ribs, right side, initial encounter for closed fracture: Secondary | ICD-10-CM

## 2017-12-15 DIAGNOSIS — J9811 Atelectasis: Secondary | ICD-10-CM

## 2017-12-15 LAB — POC BLOOD GLUCOSE (RESULTS)
GLUCOSE, POC: 104 mg/dL (ref 70–105)
GLUCOSE, POC: 138 mg/dL — ABNORMAL HIGH (ref 70–105)
GLUCOSE, POC: 98 mg/dL (ref 70–105)

## 2017-12-15 LAB — MAGNESIUM: MAGNESIUM: 2.3 mg/dL (ref 1.6–2.6)

## 2017-12-15 LAB — BASIC METABOLIC PANEL
ANION GAP: 5 mmol/L (ref 4–13)
BUN/CREA RATIO: 25 — ABNORMAL HIGH (ref 6–22)
BUN: 14 mg/dL (ref 8–25)
CALCIUM: 8.1 mg/dL — ABNORMAL LOW (ref 8.5–10.2)
CHLORIDE: 103 mmol/L (ref 96–111)
CO2 TOTAL: 29 mmol/L (ref 22–32)
CREATININE: 0.57 mg/dL (ref 0.49–1.10)
ESTIMATED GFR: >59 mL/min/1.73mˆ2 (ref >59)
GLUCOSE: 89 mg/dL (ref 65–139)
POTASSIUM: 4.4 mmol/L (ref 3.5–5.1)
SODIUM: 137 mmol/L (ref 136–145)

## 2017-12-15 LAB — CBC
HCT: 29.3 % — ABNORMAL LOW (ref 34.8–46.0)
HGB: 8.9 g/dL — ABNORMAL LOW (ref 11.5–16.0)
MCH: 28.6 pg (ref 26.0–32.0)
MCHC: 30.4 g/dL — ABNORMAL LOW (ref 31.0–35.5)
MCV: 94.2 fL (ref 78.0–100.0)
MPV: 9.5 fL (ref 8.7–12.5)
PLATELETS: 456 x10ˆ3/uL — ABNORMAL HIGH (ref 150–400)
RBC: 3.11 x10ˆ6/uL — ABNORMAL LOW (ref 3.85–5.22)
RDW-CV: 18.3 % — ABNORMAL HIGH (ref 11.5–15.5)
WBC: 7.7 x10ˆ3/uL (ref 3.7–11.0)

## 2017-12-15 LAB — PHOSPHORUS: PHOSPHORUS: 4.6 mg/dL — ABNORMAL HIGH (ref 2.3–4.0)

## 2017-12-15 MED ORDER — NUTRITION PROTEIN SUPPLEMENT - TUBE FEED
2.0000 | Freq: Every day | Status: DC
Start: 2017-12-15 — End: 2017-12-17
  Administered 2017-12-15 – 2017-12-17 (×3): 30 g via GASTROSTOMY

## 2017-12-15 MED ORDER — PSEUDOEPHEDRINE 30 MG TABLET
45.00 mg | ORAL_TABLET | Freq: Four times a day (QID) | ORAL | Status: DC
Start: 2017-12-15 — End: 2017-12-16
  Administered 2017-12-15 – 2017-12-16 (×4): 45 mg via GASTROSTOMY
  Filled 2017-12-15 (×4): qty 2

## 2017-12-15 MED ORDER — INSULIN LISPRO 100 UNIT/ML SUB-Q SSIP
2.00 [IU] | INJECTION | SUBCUTANEOUS | Status: DC | PRN
Start: 2017-12-15 — End: 2017-12-17

## 2017-12-15 MED ADMIN — ROPIVACAINE NERVE PLEXUS INFUSION: RESPIRATORY_TRACT | @ 09:00:00 | NDC 63323028630

## 2017-12-15 MED ADMIN — sodium chloride 0.9 % intravenous solution: TRANSDERMAL | @ 22:00:00

## 2017-12-15 MED ADMIN — oxyCODONE 5 mg tablet: INTRAVENOUS | @ 10:00:00

## 2017-12-15 MED ADMIN — heparin (porcine) (PF) 1,000 unit/500 mL in 0.9 % sodium chloride IV: GASTROSTOMY | @ 17:00:00 | NDC 61553095375

## 2017-12-15 MED ADMIN — electrolyte-A intravenous solution: GASTROSTOMY | @ 12:00:00

## 2017-12-15 NOTE — Care Plan (Deleted)
Wean per vent protocols, remains on vent/atc trials, treatments given per orders

## 2017-12-15 NOTE — Care Plan (Signed)
BIPAP From 0700 to 1100 From 7a-11a    Discontinue  Reschedule   Comments: Daily schedule should be:   6hrs ATC in AM   6hr BiPAP   6hrs ATC   Vent overnight   Duration: 2 Days    Priority: Routine       Process Instructions: Full Face Bipap/CPAP  orders require a separate nursing order for either "Naso-gastric Tube to suction" or an order for "No Naso-gastric Tube  Required".  Please enter another order for you preference seperately.            Patient's with continuous bipap orders should be re-evaluated DAILY to validate their continued need of this therapy.            Please also consider if patient's Bipap frequency is to allow for off time from the bipap that they may need a supplemental oxygen order during that time. Please place a separate order for the type of oxygen needed.            Patients with "CONTINUOUS" selected as a frequency should have an NPO order.                    References: [facility] CPAP/BiPAP Adjustment Protocol   Question Answer Comment   Delivery Mode FULL FACE    FIO2 (%) 92    IPAP level (cm/H2O) 10    EPAP level (cm/H2O) 5    Indications OTHER (Specify in Comments)        12/14/17 1852    12/14/17 2200  VENTILATOR - SIMV(PRVC)PS / APV-SIMV QHS (2200)    Discontinue  Reschedule   Comments: Daily schedule should be:   7hrs ATC in AM   4hr BiPAP   7hrs ATC   Vent overnight     For sigh breath per spine guidelines   Duration: Until Specified    Priority: Routine       Question Answer Comment   FIO2 (%) 30    Peep(cm/H2O) 10    Rate(bpm) 12    Tidal Volume(mls) 700    Pressure Support(cm/H2O) 10    Indications IMPROVE DISTRIBUTION OF VENTILATION        12/14/17 0936   12/14/17 0945  OXYGEN - AEROSOL TRACH COLLAR CONTINUOUS    Discontinue   Comments: Daily schedule should be:   7hrs ATC in AM   4hr BiPAP   7hrs ATC   Vent overnight     FIO2 > 50% Will be set up with Double Flow O2   Duration: Until Specified    Priority: Routine       Question Answer Comment   FIO2 (%) 30    Indications for O2  OTHER (Specify in Comments)        12/14/17 0936   12/14/17 0945  MISC RESPIRATORY ORDER CONTINUOUS    Discontinue   Comments: Daily schedule should be:   7hrs ATC in AM   4hr BiPAP   7hrs ATC   Vent overnight   Duration: Until Specified    Priority: Routine        12/14/17 0936   12/13/17 2000  RESPIRATORY CARE EVALUATION DAILY (2000)    Discontinue  Reschedule   Duration: 72 Hours    Priority: Routine       References: [facility] Respiratory Patient Assessment Protocol    [facility] Respiratory Medication Therapy Protocol    [facility] Respiratory Medication Therapy Algorithm   Question Answer Comment   Reason for Consult ASSESS  AND RECOMMEND Acute Spinal Cord Injury / Assessment of Cough Assist Device   Reason for Consult OTHER(Specify in Comments)        12/13/17 1605   12/13/17 0700  BIPAP From 0700 to 1100 From 7a-11a    Discontinue   Comments: Daily schedule should be:   6hrs ATC in AM   6hr BiPAP   6hrs ATC   Vent overnight   Expired   Duration: 2 Days    Priority: Routine       Process Instructions: Full Face Bipap/CPAP  orders require a separate nursing order for either "Naso-gastric Tube to suction" or an order for "No Naso-gastric Tube  Required".  Please enter another order for you preference seperately.            Patient's with continuous bipap orders should be re-evaluated DAILY to validate their continued need of this therapy.            Please also consider if patient's Bipap frequency is to allow for off time from the bipap that they may need a supplemental oxygen order during that time. Please place a separate order for the type of oxygen needed.            Patients with "CONTINUOUS" selected as a frequency should have an NPO order.                    References: [facility] CPAP/BiPAP Adjustment Protocol   Question Answer Comment   Delivery Mode FULL FACE    FIO2 (%) 92    IPAP level (cm/H2O) 10    EPAP level (cm/H2O) 5    Indications OTHER (Specify in Comments)        12/12/17 1110   12/07/17 0600  RESPIRATORY PARAMETERS  (FVC+NIF) DAILY (0600)    Discontinue  Reschedule   Duration: Until Specified    Priority: Routine       Question Answer Comment   Indications ASSESSMENT OF PULMONARY STATUS    RT to notify MD if FVC < (L) 1L    RT to notify MD if NIF < (cm/H2O) -20 cm/H2O        12/06/17 1054   12/06/17 1400  COUGHALATOR - THERAPIST WILL DETERMINE THE PRESSURES USED TID (1000,1400,2200)    Discontinue  Reschedule   Duration: Until Specified    Priority: Routine       Question: Indication: Answer: MOBILIZE SECRETIONS    12/06/17 1057   12/06/17 0800  LUNG RECRUITMENT MANEUVER EVERY 6 HOURS    Discontinue  Reschedule   Comments: PRN   Duration: Until Specified    Priority: Routine
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
## 2017-12-15 NOTE — Care Plan (Signed)
[facility]  Rehabilitation Services  Physical Therapy Progress Note      Patient Name: Christy Turner  Date of Birth: 1946/03/20  Height:  163.8 cm (5' 4.5")  Weight:  101.4 kg (223 lb 8.7 oz)  Room/Bed: 11/A  Payor: OTHER AUTO / Plan: OTHER AUTO / Product Type: Auto /     Assessment:     Patient tolerated ROM to LE's. Noted trace activity in PF, DF, inversion and eversion at ankle, Knee flexion and extension, and hip ABD/ADD on LLE. Will continue to work on [employer].     Discharge Needs:   Equipment Recommendation: TBD      Discharge Disposition: long term acute care facility, inpatient rehabilitation facility    JUSTIFICATION OF DISCHARGE RECOMMENDATION   Based on current diagnosis, functional performance prior to admission, and current functional performance, this patient requires continued PT services in long term acute care facility, inpatient rehabilitation facility in order to achieve significant functional improvements in these deficit areas: gait, locomotion, and balance, muscle performance, neuromuscular.      Plan:   Continue to follow patient according to established plan of care.  The risks/benefits of therapy have been discussed with the patient/caregiver and he/she is in agreement with the established plan of care.     Subjective & Objective:        12/15/17 1335   Therapist Pager   PT Assigned/ Pager # steve [phone]   Rehab Session   Document Type therapy progress note (daily note)   Total PT Minutes: 27   Patient Effort good   Symptoms Noted During/After Treatment none   General Information   Patient Profile Reviewed? yes   Medical Lines Telemetry;PIV Line   Respiratory Status ventilator   Mutuality/Individual Preferences   Individualized Care Needs OOB with maxi move   Pre Treatment Status   Pre Treatment Patient Status Patient supine in bed;Call light within reach;Telephone within reach;Nurse approved session   Support Present Pre Treatment  Family present   Communication Pre  Treatment  Nurse   Pain Assessment   Pre/Post Treatment Pain Comment C/O pain in UE's   Therapeutic Exercise/Activity   Comment AAROM/PROM to LE's.   Post Treatment Status   Post Treatment Patient Status Patient supine in bed;Call light within reach;Telephone within reach   Support Present Post Treatment  Family present   Communication Post Treatement Nurse   Plan of Care Review   Plan Of Care Reviewed With patient   Physical Therapy Clinical Impression   Assessment Patient tolerated ROM to LE's. Noted trace activity in PF, DF, inversion and eversion at ankle, Knee flexion and extension, and hip ABD/ADD on LLE. Will continue to work on [employer].    Anticipated Equipment Needs at Discharge (PT) TBD   Anticipated Discharge Disposition long term acute care facility;inpatient rehabilitation facility   Planned Therapy Interventions, PT Eval   Planned Therapy Interventions (PT) balance training;bed mobility training;transfer training       Therapist:   Berneice Gandy, PT   Pager #: [phone]

## 2017-12-15 NOTE — Care Plan (Deleted)
Atc trials daily per order, treatments given per order

## 2017-12-15 NOTE — Care Plan (Signed)
Problem: Inability to Wean (Mechanical Ventilation, Invasive)  Goal: Mechanical Ventilation Liberation  Outcome: Ongoing (see interventions/notes)   Atc trials daily per order, treatments given per order  Problem: Ventilator-Induced Lung Injury (Mechanical Ventilation, Invasive)  Goal: Absence of Ventilator-Induced Lung Injury  Outcome: Ongoing (see interventions/notes)   Atc trials daily per order, treatments given per order  Problem: Skin and Tissue Injury (Mechanical Ventilation, Invasive)  Goal: Absence of Device-Related Skin and Tissue Injury  Outcome: Ongoing (see interventions/notes)   Atc trials daily per order, treatments given per order

## 2017-12-15 NOTE — Care Plan (Deleted)
Atc trials daily per order, treatments given per order
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
## 2017-12-15 NOTE — Care Plan (Deleted)
Remains on vent per orders
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
## 2017-12-15 NOTE — Care Plan (Signed)
[facility]  Rehabilitation Services  Occupational Therapy Progress Note    Patient Name: Christy Turner  Date of Birth: 1946-04-04  Height:  163.8 cm (5' 4.5")  Weight:  101.4 kg (223 lb 8.7 oz)  Room/Bed: 11/A  Payor: OTHER AUTO / Plan: OTHER AUTO / Product Type: Auto /     Assessment:    Continues to tolerate therapy well. Limited functional use of BUE's. Improved otlerance to positional chane with use of bed. Acute rehab at d/c to further progress      Discharge Needs:   Equipment Recommendation: to be determined        Discharge Disposition:  inpatient rehabilitation facility     JUSTIFICATION OF DISCHARGE RECOMMENDATION   Based on current diagnosis, functional performance prior to admission, and current functional performance, this patient requires continued OT services in inpatient rehabilitation facility in order to achieve significant functional improvements.    Plan:   Continue to follow patient according to established plan of care.  The risks/benefits of therapy have been discussed with the patient/caregiver and he/she is in agreement with the established plan of care.     Subjective & Objective:        12/14/17 1202   Therapist Pager   OT Assigned/ Pager # Annice Pih [phone] [facility]   Rehab Session   Document Type therapy progress note (daily note)   Total OT Minutes: 45   Patient Effort good   Symptoms Noted During/After Treatment dizziness   Symptoms Noted Comment with initial positional change   Pre Treatment Status   Pre Treatment Patient Status Patient supine in bed   Support Present Pre Treatment  Family present   Communication Pre Treatment  Nurse   Pain Assessment   Pre/Post Treatment Pain Comment some paibn reported with rolling Not rated   Bed Mobility Assessment/Treatment   Supine-Sit Independence dependent (less than 25% patient effort);2 person assist required   Sit to Supine, Independence dependent (less than 25% patient effort);2 person assist required   Roll Left Independence dependent  (less than 25% patient effort);2 person assist required   Roll Right Independence dependent (less than 25% patient effort);2 person assist required   Therapeutic Exercise/Activity   Comment Slow positional change from supine to bed in chiar position. Worked on PROM and AAROM to UE;s with no significant change from last visit   Post Treatment Status   Post Treatment Patient Status Other (See comments);Call light within reach  (bed in chair position)   Support Present Post Treatment  Family present;Nurse present   Communication Post Treatement Nurse   Occupational Therapy Clinical Impression   Functional Level at Time of Session Continues to tolerate therapy well. Limited functional use of BUE's. Improved otlerance to positional chane with use of bed. Acute rehab at d/c to further progress   Anticipated Equipment Needs at Discharge to be determined   Anticipated Discharge Disposition inpatient rehabilitation facility       Therapist:   Thayer Dallas  Pager #: [phone]

## 2017-12-15 NOTE — Care Plan (Signed)
Problem: Device-Related Complication Risk (Mechanical Ventilation, Invasive)  Goal: Optimal Device Function  Outcome: Ongoing (see interventions/notes)   Atc trials daily per order, treatments given per order

## 2017-12-15 NOTE — Care Plan (Deleted)
Wean per protocols, atc trials daily, treatments given
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
## 2017-12-16 ENCOUNTER — Inpatient Hospital Stay (HOSPITAL_COMMUNITY): Payer: Medicare HMO | Admitting: Radiology

## 2017-12-16 DIAGNOSIS — J90 Pleural effusion, not elsewhere classified: Secondary | ICD-10-CM

## 2017-12-16 DIAGNOSIS — Z9884 Bariatric surgery status: Secondary | ICD-10-CM | POA: Diagnosis present

## 2017-12-16 DIAGNOSIS — R339 Retention of urine, unspecified: Secondary | ICD-10-CM

## 2017-12-16 DIAGNOSIS — S2243XA Multiple fractures of ribs, bilateral, initial encounter for closed fracture: Secondary | ICD-10-CM

## 2017-12-16 DIAGNOSIS — J9600 Acute respiratory failure, unspecified whether with hypoxia or hypercapnia: Secondary | ICD-10-CM

## 2017-12-16 DIAGNOSIS — J9811 Atelectasis: Secondary | ICD-10-CM

## 2017-12-16 DIAGNOSIS — Z9911 Dependence on respirator [ventilator] status: Secondary | ICD-10-CM

## 2017-12-16 DIAGNOSIS — E46 Unspecified protein-calorie malnutrition: Secondary | ICD-10-CM

## 2017-12-16 DIAGNOSIS — S22008A Other fracture of unspecified thoracic vertebra, initial encounter for closed fracture: Secondary | ICD-10-CM

## 2017-12-16 DIAGNOSIS — R739 Hyperglycemia, unspecified: Secondary | ICD-10-CM

## 2017-12-16 DIAGNOSIS — Z950 Presence of cardiac pacemaker: Secondary | ICD-10-CM

## 2017-12-16 DIAGNOSIS — S14109A Unspecified injury at unspecified level of cervical spinal cord, initial encounter: Secondary | ICD-10-CM

## 2017-12-16 LAB — CBC
HCT: 28.8 % — ABNORMAL LOW (ref 34.8–46.0)
HGB: 9 g/dL — ABNORMAL LOW (ref 11.5–16.0)
MCH: 28.9 pg (ref 26.0–32.0)
MCHC: 31.3 g/dL (ref 31.0–35.5)
MCV: 92.6 fL (ref 78.0–100.0)
MPV: 9.5 fL (ref 8.7–12.5)
PLATELETS: 475 x10ˆ3/uL — ABNORMAL HIGH (ref 150–400)
RBC: 3.11 x10ˆ6/uL — ABNORMAL LOW (ref 3.85–5.22)
RDW-CV: 18.6 % — ABNORMAL HIGH (ref 11.5–15.5)
WBC: 6.4 10*3/uL (ref 3.7–11.0)

## 2017-12-16 LAB — BASIC METABOLIC PANEL
ANION GAP: 7 mmol/L (ref 4–13)
BUN/CREA RATIO: 25 — ABNORMAL HIGH (ref 6–22)
BUN: 14 mg/dL (ref 8–25)
CALCIUM: 8.4 mg/dL — ABNORMAL LOW (ref 8.5–10.2)
CHLORIDE: 104 mmol/L (ref 96–111)
CO2 TOTAL: 27 mmol/L (ref 22–32)
CREATININE: 0.57 mg/dL (ref 0.49–1.10)
ESTIMATED GFR: >59 mL/min/1.73mˆ2 (ref >59)
GLUCOSE: 89 mg/dL (ref 65–139)
POTASSIUM: 4.2 mmol/L (ref 3.5–5.1)
SODIUM: 138 mmol/L (ref 136–145)

## 2017-12-16 LAB — POC BLOOD GLUCOSE (RESULTS)
GLUCOSE, POC: 83 mg/dL (ref 70–105)
GLUCOSE, POC: 99 mg/dL (ref 70–105)
GLUCOSE, POC: 89 mg/dL (ref 70–105)
GLUCOSE, POC: 117 mg/dL — ABNORMAL HIGH (ref 70–105)

## 2017-12-16 LAB — PHOSPHORUS: PHOSPHORUS: 4.2 mg/dL — ABNORMAL HIGH (ref 2.3–4.0)

## 2017-12-16 LAB — MAGNESIUM: MAGNESIUM: 2.3 mg/dL (ref 1.6–2.6)

## 2017-12-16 LAB — HEPARIN LOW MOLECULAR WEIGHT ANTI-XA, PLASMA: HEPARIN LOW MOLECULAR WEIGHT: 0.19 [IU]/mL

## 2017-12-16 MED ORDER — INSULIN GLARGINE (U-100) 100 UNIT/ML SUBCUTANEOUS SOLUTION
10.0000 [IU] | Freq: Every day | SUBCUTANEOUS | Status: DC
Start: 2017-12-17 — End: 2017-12-17
  Administered 2017-12-17: 10 [IU] via SUBCUTANEOUS
  Filled 2017-12-16: qty 10

## 2017-12-16 MED ORDER — ENOXAPARIN 40 MG/0.4 ML SUBCUTANEOUS SYRINGE
40.00 mg | INJECTION | Freq: Two times a day (BID) | SUBCUTANEOUS | Status: DC
Start: 2017-12-16 — End: 2017-12-17
  Administered 2017-12-16 – 2017-12-17 (×2): 40 mg via SUBCUTANEOUS
  Filled 2017-12-16 (×4): qty 0.4

## 2017-12-16 MED ORDER — PSEUDOEPHEDRINE 30 MG TABLET
30.00 mg | ORAL_TABLET | Freq: Four times a day (QID) | ORAL | Status: DC
Start: 2017-12-16 — End: 2017-12-17
  Administered 2017-12-16 – 2017-12-17 (×4): 30 mg via GASTROSTOMY
  Filled 2017-12-16 (×5): qty 1

## 2017-12-16 MED ADMIN — methocarbamoL 500 mg tablet: GASTROSTOMY | @ 08:00:00

## 2017-12-16 MED ADMIN — albuterol sulfate concentrate 2.5 mg/0.5 mL solution for nebulization: RESPIRATORY_TRACT | @ 17:00:00

## 2017-12-16 MED ADMIN — methocarbamoL 500 mg tablet: GASTROSTOMY | @ 13:00:00

## 2017-12-16 MED ADMIN — lactated Ringers intravenous solution: RESPIRATORY_TRACT | @ 08:00:00 | NDC 00338011704

## 2017-12-16 MED ADMIN — nystatin 100,000 unit/gram topical powder: INTRAVENOUS | @ 21:00:00 | NDC 00574200815

## 2017-12-16 MED ADMIN — sodium chloride 0.9 % (flush) injection syringe: GASTROSTOMY | @ 21:00:00

## 2017-12-16 MED ADMIN — HEPARIN 1 UNIT/ML IN NS 50 ML PEDS FLUSH: RESPIRATORY_TRACT | @ 22:00:00

## 2017-12-16 NOTE — Care Plan (Signed)
Patient continues to be vitally stable. Weaning pseudophed daily. Continues to ATC 7 on, 4 off and vent at night. Turned q 2, mepilex's intact/reapplied to sacrum, heels and elbows. Skin is CDI with no signs of skin breakdown. Decision made on LTAC placement. Tolerating continuous tube feeds with no issues. Pain better controlled, still complains of numbness/tingling to extremities, along with back, neck & rib cage pain. Receiving PRN's as needed. Foley continues to drain high amount of clear, yellow urine from 100-275/hr. Patient and family updated on plan of care. No questions at this time, will continue to monitor.

## 2017-12-16 NOTE — Care Plan (Signed)
Respiratory Orders   (From admission, onward)   Start   Ordered   12/16/17 2000  RESPIRATORY CARE EVALUATION DAILY (2000)       Duration: 72 Hours    Priority: Routine       References: [facility] Respiratory Patient Assessment Protocol    [facility] Respiratory Medication Therapy Protocol    [facility] Respiratory Medication Therapy Algorithm   Question Answer Comment   Reason for Consult ASSESS AND RECOMMEND Acute Spinal Cord Injury / Assessment of Cough Assist Device   Reason for Consult OTHER(Specify in Comments)        12/15/17 2322   12/16/17 0700  BIPAP From 0700 to 1100 From 7a-11a       Comments: Daily schedule should be:   7hrs ATC in AM   4hr BiPAP   7hrs ATC   Vent overnight   Duration: 2 Days    Priority: Routine       Process Instructions: Full Face Bipap/CPAP  orders require a separate nursing order for either "Naso-gastric Tube to suction" or an order for "No Naso-gastric Tube  Required".  Please enter another order for you preference seperately.            Patient's with continuous bipap orders should be re-evaluated DAILY to validate their continued need of this therapy.            Please also consider if patient's Bipap frequency is to allow for off time from the bipap that they may need a supplemental oxygen order during that time. Please place a separate order for the type of oxygen needed.            Patients with "CONTINUOUS" selected as a frequency should have an NPO order.                    References: [facility] CPAP/BiPAP Adjustment Protocol   Question Answer Comment   Delivery Mode INVASIVE trach   FIO2 (%) 30    IPAP level (cm/H2O) 10    EPAP level (cm/H2O) 10    Indications OTHER (Specify in Comments)        12/15/17 2320   12/14/17 2200  VENTILATOR - SIMV(PRVC)PS / APV-SIMV QHS (2200)       Comments: Daily schedule should be:   7hrs ATC in AM   4hr BiPAP   7hrs ATC   Vent overnight     For sigh breath per spine guidelines   Duration: Until Specified    Priority: Routine       Question Answer Comment   FIO2 (%)  30    Peep(cm/H2O) 10    Rate(bpm) 12    Tidal Volume(mls) 700    Pressure Support(cm/H2O) 10    Indications IMPROVE DISTRIBUTION OF VENTILATION        12/14/17 0936   12/14/17 0945  OXYGEN - AEROSOL TRACH COLLAR CONTINUOUS      Comments: Daily schedule should be:   7hrs ATC in AM   4hr BiPAP   7hrs ATC   Vent overnight     FIO2 > 50% Will be set up with Double Flow O2   Duration: Until Specified    Priority: Routine       Question Answer Comment   FIO2 (%) 30    Indications for O2 OTHER (Specify in Comments)        12/14/17 0936   12/14/17 0945  MISC RESPIRATORY ORDER CONTINUOUS      Comments: Daily schedule should be:  7hrs ATC in AM   4hr BiPAP   7hrs ATC   Vent overnight   Duration: Until Specified    Priority: Routine        12/14/17 0936   12/07/17 0600  RESPIRATORY PARAMETERS (FVC+NIF) DAILY (0600)     Duration: Until Specified    Priority: Routine       Question Answer Comment   Indications ASSESSMENT OF PULMONARY STATUS    RT to notify MD if FVC < (L) 1L    RT to notify MD if NIF < (cm/H2O) -20 cm/H2O        12/06/17 1054   12/06/17 1400  COUGHALATOR - THERAPIST WILL DETERMINE THE PRESSURES USED TID (1000,1400,2200)      Duration: Until Specified    Priority: Routine       Question: Indication: Answer: MOBILIZE SECRETIONS    12/06/17 1057   12/06/17 0800  LUNG RECRUITMENT MANEUVER EVERY 6 HOURS      Comments: PRN   Duration: Until Specified    Priority: Routine        12/06/17 0606      MAR Note     Note Date and Time Entered User   [Cleared the note] 12/07/17 1945 Antonietta Jewel, RN   Respiratory Medications   (From admission, onward)   Start   Ordered Stop   12/07/17 1200  albuterol (PROVENTIL) 2.5mg / 0.5 mL nebulizer solution 2.5 mg, Nebulization, 3 TIMES DAILY     "And" Linked Group Details    12/07/17 1037 --   12/07/17 1200  sodium chloride (3% SALINE) nebulizer solution 3 mL, Nebulization, 3 TIMES DAILY     "And" Linked Group Details

## 2017-12-16 NOTE — Respiratory Therapy (Signed)
Respiratory Orders   (From admission, onward)   Start   Ordered   12/16/17 2200  VENTILATOR - SIMV(PRVC)PS / APV-SIMV QHS (2200)    Comments: Daily schedule should be:   7hrs ATC in AM   4hr BiPAP   7hrs ATC   Vent overnight     For sigh breath per spine guidelines   Duration: Until Specified    Priority: Routine       Question Answer Comment   FIO2 (%) 30    Peep(cm/H2O) 10    Rate(bpm) 5    Tidal Volume(mls) 700    Pressure Support(cm/H2O) 10    Indications IMPROVE DISTRIBUTION OF VENTILATION        12/16/17 1641   12/16/17 2000  RESPIRATORY CARE EVALUATION DAILY (2000) DiscontinueReschedule   Duration: 72 Hours    Priority: Routine       References: [facility] Respiratory Patient Assessment ProtocolRMH Respiratory Medication Therapy ProtocolRMH Respiratory Medication Therapy Algorithm   Question Answer Comment   Reason for Consult ASSESS AND RECOMMEND Acute Spinal Cord Injury / Assessment of Cough Assist Device   Reason for Consult OTHER(Specify in Comments)        12/15/17 2322   12/16/17 0700  BIPAP From 0700 to 1100 From 7a-11a    Comments: Daily schedule should be:   7hrs ATC in AM   4hr BiPAP   7hrs ATC   Vent overnight   Duration: 2 Days    Priority: Routine       Process Instructions: Full Face Bipap/CPAP orders require a separate nursing order for either "Naso-gastric Tube to suction" or an order for "No Naso-gastric Tube Required". Please enter another order for you preference seperately.         Patient's with continuous bipap orders should be re-evaluated DAILY to validate their continued need of this therapy.         Please also consider if patient's Bipap frequency is to allow for off time from the bipap that they may need a supplemental oxygen order during that time. Please place a separate order for the type of oxygen needed.         Patients with "CONTINUOUS" selected as a frequency should have an NPO order.                References: [facility] CPAP/BiPAP Adjustment Protocol      Question Answer Comment   Delivery Mode INVASIVE trach   FIO2 (%) 30    IPAP level (cm/H2O) 10    EPAP level (cm/H2O) 10    Indications OTHER (Specify in Comments)        12/15/17 2320   12/14/17 0945  OXYGEN - AEROSOL TRACH COLLAR CONTINUOUS    Comments: Daily schedule should be:   7hrs ATC in AM   4hr BiPAP   7hrs ATC   Vent overnight     FIO2 > 50% Will be set up with Double Flow O2   Duration: Until Specified    Priority: Routine       Question Answer Comment   FIO2 (%) 30    Indications for O2 OTHER (Specify in Comments)        12/14/17 0936   12/14/17 0945  MISC RESPIRATORY ORDER CONTINUOUS    Comments: Daily schedule should be:   7hrs ATC in AM   4hr BiPAP   7hrs ATC   Vent overnight   Duration: Until Specified    Priority: Routine  12/14/17 0936   12/07/17 0600  RESPIRATORY PARAMETERS (FVC+NIF) DAILY (0600)    Duration: Until Specified    Priority: Routine       Question Answer Comment   Indications ASSESSMENT OF PULMONARY STATUS    RT to notify MD if FVC < (L) 1L    RT to notify MD if NIF < (cm/H2O) -20 cm/H2O        12/06/17 1054   12/06/17 1400  COUGHALATOR - THERAPIST WILL DETERMINE THE PRESSURES USED TID (1000,1400,2200)    Duration: Until Specified    Priority: Routine       Question: Indication: Answer: MOBILIZE SECRETIONS    12/06/17 1057   12/06/17 0800  LUNG RECRUITMENT MANEUVER EVERY 6 HOURS    Comments: PRN   Duration: Until Specified    Priority: Routine        12/06/17 0606      MAR Note     Note Date and Time Entered User   [Cleared the note] 12/07/17 1945 Antonietta Jewel, RN   Respiratory Medications   (From admission, onward)   Start   Ordered Stop   12/07/17 1200  albuterol (PROVENTIL) 2.5mg / 0.5 mL nebulizer solution 2.5 mg, Nebulization, 3 TIMES DAILY    "And" Linked Group Details    12/07/17 1037 --   12/07/17 1200  sodium chloride (3% SALINE) nebulizer solution 3 mL, Nebulization, 3 TIMES DAILY    "And" Linked Group Details          Patient seen at this time for  Aggressive Pulmonary Toilet Pulmonary Evaluation.Will follow to provide aggressive pulmonary toilet regime as ordered.

## 2017-12-16 NOTE — Care Plan (Signed)
[facility]  Rehabilitation Services  Occupational Therapy Progress Note    Patient Name: Christy Turner  Date of Birth: 04/11/1946  Height:  163.8 cm (5' 4.5")  Weight:  99.5 kg (219 lb 5.7 oz)  Room/Bed: 11/A  Payor: OTHER AUTO / Plan: OTHER AUTO / Product Type: Auto /     Assessment:    Pt with increased active movement in BUE's and in R foot. Pt with movement in B thumbs, wrist flexion in BUE and extension in L Increased tricep in LUE Acute vs LTAC      Discharge Needs:   Equipment Recommendation: to be determined        Discharge Disposition:  long term acute care facility, inpatient rehabilitation facility     JUSTIFICATION OF DISCHARGE RECOMMENDATION   Based on current diagnosis, functional performance prior to admission, and current functional performance, this patient requires continued OT services in long term acute care facility, inpatient rehabilitation facility in order to achieve significant functional improvements.    Plan:   Continue to follow patient according to established plan of care.  The risks/benefits of therapy have been discussed with the patient/caregiver and he/she is in agreement with the established plan of care.     Subjective & Objective:        12/16/17 1529   Therapist Pager   OT Assigned/ Pager # Annice Pih [phone] [place]   Rehab Session   Document Type therapy progress note (daily note)   Total OT Minutes: 25   Patient Effort good   Symptoms Noted During/After Treatment none   General Information   General Observations of Patient in bed agrees to therapy   Pre Treatment Status   Pre Treatment Patient Status Patient supine in bed   Support Present Pre Treatment  Family present   Communication Pre Treatment  Nurse   Mutuality/Individual Preferences   Individualized Care Needs Maxi Move OOB   Pain Assessment   Pre/Post Treatment Pain Comment c/o UE pain   Therapeutic Exercise/Activity   Comment AAROM of BUE's and assessment of new active movement   Post Treatment Status   Post  Treatment Patient Status Patient supine in bed   Support Present Post Treatment  Family present   Communication Post Treatement Nurse   Occupational Therapy Clinical Impression   Functional Level at Time of Session Pt with increased active movement in BUE's and in R foot. Pt with movement in B thumbs, wrist flexion in BUE and extension in L Increased tricep in LUE Acute vs LTAC   Anticipated Equipment Needs at Discharge to be determined   Anticipated Discharge Disposition long term acute care facility;inpatient rehabilitation facility       Therapist:   Thayer Dallas  Pager #: [phone]

## 2017-12-16 NOTE — Care Plan (Signed)
[facility]  Rehabilitation Services  Physical Therapy Progress Note      Patient Name: Christy Turner  Date of Birth: 1945-12-07  Height:  163.8 cm (5' 4.5")  Weight:  99.5 kg (219 lb 5.7 oz)  Room/Bed: 11/A  Payor: OTHER AUTO / Plan: OTHER AUTO / Product Type: Auto /     Assessment:     Patient tolerated ROM to LE's. Noted wrist flexion, tricep extension and thumb flexion in UE's, RLE PF.  Gaining new movement daily. 2-/5 level.    Discharge Needs:   Equipment Recommendation: TBD      Discharge Disposition: long term acute care facility, inpatient rehabilitation facility    JUSTIFICATION OF DISCHARGE RECOMMENDATION   Based on current diagnosis, functional performance prior to admission, and current functional performance, this patient requires continued PT services in long term acute care facility, inpatient rehabilitation facility in order to achieve significant functional improvements in these deficit areas: gait, locomotion, and balance, muscle performance, neuromuscular.      Plan:   Continue to follow patient according to established plan of care.  The risks/benefits of therapy have been discussed with the patient/caregiver and he/she is in agreement with the established plan of care.     Subjective & Objective:        12/16/17 1530   Therapist Pager   PT Assigned/ Pager # steve [phone]   Rehab Session   Document Type therapy progress note (daily note)   Patient Effort good   Symptoms Noted During/After Treatment none   General Information   Patient Profile Reviewed? yes   Medical Lines Telemetry;PIV Line;Foley Catheter   Respiratory Status ventilator   Mutuality/Individual Preferences   Individualized Care Needs OOB with maxi move   Pre Treatment Status   Pre Treatment Patient Status Patient supine in bed;Call light within reach;Telephone within reach;Nurse approved session   Support Present Pre Treatment  Family present   Communication Pre Treatment  Nurse   Pain Assessment   Pre/Post Treatment  Pain Comment C/O pain in UE's   Therapeutic Exercise/Activity   Comment AAROM LE's   Post Treatment Status   Post Treatment Patient Status Patient supine in bed;Call light within reach;Telephone within reach   Support Present Post Treatment  Family present;Nurse present   Communication Post Treatement Nurse   Plan of Care Review   Plan Of Care Reviewed With patient;family   Physical Therapy Clinical Impression   Assessment Patient tolerated ROM to LE's. Noted wrist flexion, tricep extension and thumb flexion in UE's, RLE PF.  Gaining new movement daily. 2-/5 level.   Anticipated Equipment Needs at Discharge (PT) TBD   Anticipated Discharge Disposition long term acute care facility;inpatient rehabilitation facility   Planned Therapy Interventions, PT Eval   Planned Therapy Interventions (PT) balance training;bed mobility training;transfer training       Therapist:   Berneice Gandy, PT   Pager #: [phone]
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
## 2017-12-17 ENCOUNTER — Inpatient Hospital Stay (HOSPITAL_COMMUNITY): Payer: Medicare HMO

## 2017-12-17 DIAGNOSIS — J189 Pneumonia, unspecified organism: Secondary | ICD-10-CM

## 2017-12-17 DIAGNOSIS — J90 Pleural effusion, not elsewhere classified: Secondary | ICD-10-CM

## 2017-12-17 DIAGNOSIS — S27329A Contusion of lung, unspecified, initial encounter: Secondary | ICD-10-CM | POA: Diagnosis present

## 2017-12-17 DIAGNOSIS — J9811 Atelectasis: Secondary | ICD-10-CM

## 2017-12-17 DIAGNOSIS — E46 Unspecified protein-calorie malnutrition: Secondary | ICD-10-CM | POA: Diagnosis present

## 2017-12-17 DIAGNOSIS — R64 Cachexia: Secondary | ICD-10-CM | POA: Diagnosis present

## 2017-12-17 DIAGNOSIS — G8250 Quadriplegia, unspecified: Secondary | ICD-10-CM | POA: Diagnosis present

## 2017-12-17 LAB — MAGNESIUM: MAGNESIUM: 2.3 mg/dL (ref 1.6–2.6)

## 2017-12-17 LAB — PHOSPHORUS: PHOSPHORUS: 4.6 mg/dL — ABNORMAL HIGH (ref 2.3–4.0)

## 2017-12-17 LAB — CBC
HCT: 29.1 % — ABNORMAL LOW (ref 34.8–46.0)
HGB: 9 g/dL — ABNORMAL LOW (ref 11.5–16.0)
MCH: 28.7 pg (ref 26.0–32.0)
MCHC: 30.9 g/dL — ABNORMAL LOW (ref 31.0–35.5)
MCV: 92.7 fL (ref 78.0–100.0)
MPV: 9.5 fL (ref 8.7–12.5)
PLATELETS: 461 x10ˆ3/uL — ABNORMAL HIGH (ref 150–400)
RBC: 3.14 x10ˆ6/uL — ABNORMAL LOW (ref 3.85–5.22)
RDW-CV: 18.6 % — ABNORMAL HIGH (ref 11.5–15.5)
WBC: 6 x10ˆ3/uL (ref 3.7–11.0)

## 2017-12-17 LAB — BASIC METABOLIC PANEL
ANION GAP: 7 mmol/L (ref 4–13)
ANION GAP: 7 mmol/L (ref 4–13)
BUN/CREA RATIO: 27 — ABNORMAL HIGH (ref 6–22)
BUN: 17 mg/dL (ref 8–25)
CALCIUM: 8.1 mg/dL — ABNORMAL LOW (ref 8.5–10.2)
CHLORIDE: 103 mmol/L (ref 96–111)
CO2 TOTAL: 27 mmol/L (ref 22–32)
CREATININE: 0.62 mg/dL (ref 0.49–1.10)
ESTIMATED GFR: >59 mL/min/{1.73_m2} (ref >59)
GLUCOSE: 114 mg/dL (ref 65–139)
POTASSIUM: 4.1 mmol/L (ref 3.5–5.1)
SODIUM: 137 mmol/L (ref 136–145)

## 2017-12-17 LAB — POC BLOOD GLUCOSE (RESULTS)
GLUCOSE, POC: 116 mg/dL — ABNORMAL HIGH (ref 70–105)
GLUCOSE, POC: 129 mg/dL — ABNORMAL HIGH (ref 70–105)

## 2017-12-17 MED ORDER — ENOXAPARIN 40 MG/0.4 ML SUBCUTANEOUS SYRINGE
40.00 mg | INJECTION | Freq: Two times a day (BID) | SUBCUTANEOUS | 1 refills | Status: AC
Start: 2017-12-17 — End: 2018-03-17

## 2017-12-17 MED ORDER — PSEUDOEPHEDRINE 30 MG TABLET
30.00 mg | ORAL_TABLET | Freq: Four times a day (QID) | ORAL | Status: AC
Start: 2017-12-17 — End: ?

## 2017-12-17 MED ORDER — PSYLLIUM ORAL PACKET WRAPPER
1.0000 | PACK | Freq: Every day | Status: AC
Start: 2017-12-18 — End: ?

## 2017-12-17 MED ORDER — OXYCODONE 10 MG/0.5 ML ORAL SYRINGE (FOR ORAL USE ONLY)
10.00 mg | INJECTION | ORAL | Status: AC | PRN
Start: 2017-12-17 — End: ?

## 2017-12-17 MED ORDER — INSULIN GLARGINE (U-100) 100 UNIT/ML SUBCUTANEOUS SOLUTION
10.0000 [IU] | Freq: Every day | SUBCUTANEOUS | 1 refills | Status: AC
Start: 2017-12-18 — End: 2018-01-17

## 2017-12-17 MED ORDER — METHOCARBAMOL 500 MG TABLET
1000.00 mg | ORAL_TABLET | Freq: Four times a day (QID) | ORAL | Status: AC
Start: 2017-12-17 — End: ?

## 2017-12-17 MED ORDER — SERTRALINE 50 MG TABLET
50.0000 mg | ORAL_TABLET | Freq: Every day | ORAL | Status: AC
Start: 2017-12-18 — End: ?

## 2017-12-17 MED ORDER — PRENATAL VIT-IRON-FOLATE TAB WRAPPER
1.0000 | ORAL_TABLET | Freq: Every day | Status: AC
Start: 2017-12-18 — End: ?

## 2017-12-17 MED ORDER — ASCORBIC ACID (VITAMIN C) 500 MG TABLET: 500 mg | Tab | Freq: Two times a day (BID) | ORAL | 0 refills | 0 days | Status: AC

## 2017-12-17 MED ORDER — BISACODYL 10 MG RECTAL SUPPOSITORY
10.0000 mg | Freq: Every day | RECTAL | 1 refills | Status: AC
Start: 2017-12-18 — End: ?

## 2017-12-17 MED ORDER — CHLORHEXIDINE GLUCONATE 0.12 % MOUTHWASH
15.00 mL | MOUTHWASH | Freq: Two times a day (BID) | 1 refills | Status: AC
Start: 2017-12-17 — End: 2018-03-17

## 2017-12-17 MED ORDER — LANOLIN-OXYQUIN-PET, HYDROPHIL TOPICAL OINTMENT
TOPICAL_OINTMENT | Freq: Two times a day (BID) | CUTANEOUS | 1 refills | Status: AC | PRN
Start: 2017-12-17 — End: 2018-03-17

## 2017-12-17 MED ORDER — MELATONIN 3 MG TABLET
6.00 mg | ORAL_TABLET | Freq: Every evening | ORAL | Status: AC
Start: 2017-12-17 — End: ?

## 2017-12-17 MED ORDER — SENNA LEAF EXTRACT 176 MG/5 ML ORAL SYRUP
10.00 mL | ORAL_SOLUTION | Freq: Two times a day (BID) | ORAL | Status: AC
Start: 2017-12-17 — End: ?

## 2017-12-17 MED ORDER — INSULIN LISPRO 100 UNIT/ML SUB-Q SSIP
2.00 [IU] | INJECTION | SUBCUTANEOUS | Status: AC | PRN
Start: 2017-12-17 — End: ?

## 2017-12-17 MED ORDER — SODIUM CHLORIDE 0.9 % INTRAVENOUS PIGGYBACK
3.3750 g | Freq: Three times a day (TID) | INTRAVENOUS | Status: AC
Start: 2017-12-17 — End: 2017-12-21

## 2017-12-17 MED ORDER — ONDANSETRON HCL (PF) 4 MG/2 ML INJECTION SOLUTION
4.00 mg | Freq: Three times a day (TID) | INTRAMUSCULAR | Status: AC | PRN
Start: 2017-12-17 — End: ?

## 2017-12-17 MED ORDER — ALBUTEROL SULFATE CONCENTRATE 2.5 MG/0.5 ML SOLUTION FOR NEBULIZATION
2.50 mg | INHALATION_SOLUTION | Freq: Three times a day (TID) | RESPIRATORY_TRACT | 1 refills | Status: AC
Start: 2017-12-17 — End: ?

## 2017-12-17 MED ORDER — SODIUM CHLORIDE 3 % FOR NEBULIZATION
3.00 mL | INHALATION_SOLUTION | Freq: Three times a day (TID) | RESPIRATORY_TRACT | 0 refills | Status: AC
Start: 2017-12-17 — End: ?

## 2017-12-17 MED ORDER — HYDROMORPHONE 2 MG/ML INJECTION SYRINGE
0.20 mg | INJECTION | INTRAMUSCULAR | Status: AC | PRN
Start: 2017-12-17 — End: ?

## 2017-12-17 MED ORDER — ACETAMINOPHEN 325 MG TABLET
975.00 mg | ORAL_TABLET | Freq: Four times a day (QID) | ORAL | 0 refills | Status: AC
Start: 2017-12-17 — End: ?

## 2017-12-17 MED ORDER — FAMOTIDINE 20 MG TABLET
20.00 mg | ORAL_TABLET | Freq: Two times a day (BID) | ORAL | 1 refills | Status: AC
Start: 2017-12-17 — End: 2018-03-17

## 2017-12-17 MED ORDER — GABAPENTIN 300 MG CAPSULE
900.00 mg | ORAL_CAPSULE | Freq: Three times a day (TID) | ORAL | Status: DC
Start: 2017-12-17 — End: 2017-12-17
  Administered 2017-12-17: 900 mg via GASTROSTOMY
  Filled 2017-12-17: qty 3

## 2017-12-17 MED ORDER — NUTRITION PROTEIN SUPPLEMENT - TUBE FEED
2.0000 | Freq: Every day | Status: AC
Start: 2017-12-18 — End: ?

## 2017-12-17 MED ORDER — GABAPENTIN 300 MG CAPSULE
900.00 mg | ORAL_CAPSULE | Freq: Three times a day (TID) | ORAL | 0 refills | Status: AC
Start: 2017-12-17 — End: 2018-01-16

## 2017-12-17 MED ORDER — OXYCODONE 10 MG/0.5 ML ORAL SYRINGE (FOR ORAL USE ONLY)
15.00 mg | INJECTION | ORAL | Status: AC | PRN
Start: 2017-12-17 — End: ?

## 2017-12-17 MED ORDER — DOCUSATE SODIUM 50 MG/5 ML ORAL LIQUID
100.00 mg | Freq: Two times a day (BID) | ORAL | 1 refills | Status: AC
Start: 2017-12-17 — End: ?

## 2017-12-17 MED ADMIN — oxyCODONE 10 mg/0.5 mL oral syringe (FOR ORAL USE ONLY): GASTROSTOMY | @ 08:00:00

## 2017-12-17 MED ADMIN — HYDROmorphone 2 mg/mL injection syringe: INTRAVENOUS | @ 06:00:00

## 2017-12-17 MED ADMIN — ceFAZolin 1 gram/50 mL in dextrose (iso-osmotic) intravenous piggyback: GASTROSTOMY | @ 06:00:00
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
## 2017-12-17 NOTE — Care Plan (Addendum)
Respiratory Orders         Start   Ordered   12/16/17 2200  VENTILATOR - SIMV(PRVC)PS / APV-SIMV QHS (2200)   Comments: Daily schedule should be:   7hrs ATC in AM   4hr BiPAP   7hrs ATC   Vent overnight     For sigh breath per spine guidelines   Duration: Until Specified    Priority: Routine       Question Answer Comment   FIO2 (%) 30    Peep(cm/H2O) 10    Rate(bpm) 5    Tidal Volume(mls) 700    Pressure Support(cm/H2O) 10    Indications IMPROVE DISTRIBUTION OF VENTILATION        12/16/17 1641   12/16/17 2000  RESPIRATORY CARE EVALUATION DAILY (2000)      Duration: 72 Hours    Priority: Routine          Question Answer Comment   Reason for Consult ASSESS AND RECOMMEND Acute Spinal Cord Injury / Assessment of Cough Assist Device   Reason for Consult OTHER(Specify in Comments)        12/15/17 2322   12/16/17 0700  BIPAP From 0700 to 1100 From 7a-11a       Comments: Daily schedule should be:   7hrs ATC in AM   4hr BiPAP   7hrs ATC   Vent overnight   Expiring   Duration: 2 Days    Priority: Routine       Process Instructions: Full Face Bipap/CPAP  orders require a separate nursing order for either "Naso-gastric Tube to suction" or an order for "No Naso-gastric Tube  Required".  Please enter another order for you preference seperately.            Patient's with continuous bipap orders should be re-evaluated DAILY to validate their continued need of this therapy.            Please also consider if patient's Bipap frequency is to allow for off time from the bipap that they may need a supplemental oxygen order during that time. Please place a separate order for the type of oxygen needed.            Patients with "CONTINUOUS" selected as a frequency should have an NPO order.                    References: [facility] CPAP/BiPAP Adjustment Protocol   Question Answer Comment   Delivery Mode INVASIVE trach   FIO2 (%) 30    IPAP level (cm/H2O) 10    EPAP level (cm/H2O) 10    Indications OTHER (Specify in Comments)        12/15/17 2320      12/14/17 0945  OXYGEN - AEROSOL TRACH COLLAR CONTINUOUS       Comments: Daily schedule should be:   7hrs ATC in AM   4hr BiPAP   7hrs ATC   Vent overnight     FIO2 > 50% Will be set up with Double Flow O2   Duration: Until Specified    Priority: Routine       Question Answer Comment   FIO2 (%) 30    Indications for O2 OTHER (Specify in Comments)        12/14/17 0936   12/14/17 0945  MISC RESPIRATORY    Comments: Daily schedule should be:   7hrs ATC in AM   4hr BiPAP   7hrs ATC   Vent overnight   Duration: Until Specified  Priority: Routine        12/14/17 0936   12/07/17 0600  RESPIRATORY PARAMETERS (FVC+NIF) DAILY    Duration: Until Specified    Priority: Routine       Question Answer Comment   Indications ASSESSMENT OF PULMONARY STATUS    RT to notify MD if FVC < (L) 1L    RT to notify MD if NIF < (cm/H2O) -20 cm/H2O        12/06/17 1054   12/06/17 1400  COUGHALATOR - THERAPIST WILL DETERMINE THE PRESSURES USED TID (1000,1400,2200)       Duration: Until Specified    Priority: Routine       Question: Indication: Answer: MOBILIZE SECRETIONS    12/06/17 1057   12/06/17 0800  LUNG RECRUITMENT MANEUVER EVERY 6 HOURS    Comments: PRN   Duration: Until Specified    Priority: Routine        12/06/17 0606      MAR Note     Note Date and Time Entered User   [Cleared the note] 12/07/17 1945 Antonietta Jewel, RN   Respiratory Medications   (From admission, onward)   Start   Ordered Stop   12/07/17 1200  albuterol (PROVENTIL) 2.5mg / 0.5 mL nebulizer solution 2.5 mg, Nebulization, 3 TIMES DAILY     "And" Linked Group Details    12/07/17 1037 --   12/07/17 1200  sodium chloride (3% SALINE) nebulizer solution 3 mL, Nebulization, 3 TIMES DAILY     "And" Linked Group Details    12/07/17 1037

## 2017-12-17 NOTE — Care Plan (Signed)
Patient to be discharged via ground transport to [facility] around 2pm. Report called to receiving facility. Daughter and son at bedside with patient. No further questions at this time. Receiving facility okay with maintaining 2 PIV's that are already in place as well as foley catheter.
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
## 2017-12-17 NOTE — Discharge Instructions (Signed)
PLEASE [facility] WITH REPORT @ [phone], [facility]!
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
## 2017-12-17 NOTE — Care Plan (Signed)
Patient continues to be vitally stable. Gross motor movements of the uppers, weak fine motor in L and R lower. TF running at goal of Glucerna 50 ml/hr. Turned Q2Hs. Foley output clear yellow, 60-200 ml/hr. Plan for LTAC 8/28.  Macie Burows, RN     Problem: Communication Impairment (Mechanical Ventilation, Invasive)  Goal: Effective Communication  Outcome: Ongoing (see interventions/notes)     Problem: Device-Related Complication Risk (Mechanical Ventilation, Invasive)  Goal: Optimal Device Function  Outcome: Ongoing (see interventions/notes)     Problem: Inability to Wean (Mechanical Ventilation, Invasive)  Goal: Mechanical Ventilation Liberation  Outcome: Ongoing (see interventions/notes)     Problem: Nutrition Impairment (Mechanical Ventilation, Invasive)  Goal: Optimal Nutrition Delivery  Outcome: Ongoing (see interventions/notes)     Problem: Skin and Tissue Injury (Mechanical Ventilation, Invasive)  Goal: Absence of Device-Related Skin and Tissue Injury  Outcome: Ongoing (see interventions/notes)     Problem: Ventilator-Induced Lung Injury (Mechanical Ventilation, Invasive)  Goal: Absence of Ventilator-Induced Lung Injury  Outcome: Ongoing (see interventions/notes)     Problem: Skin Injury Risk Increased  Goal: Skin Health and Integrity  Outcome: Ongoing (see interventions/notes)     Problem: Adult Inpatient Plan of Care  Goal: Plan of Care Review  Outcome: Ongoing (see interventions/notes)  Goal: Patient-Specific Goal (Individualization)  Outcome: Ongoing (see interventions/notes)  Goal: Absence of Hospital-Acquired Illness or Injury  Outcome: Ongoing (see interventions/notes)  Goal: Optimal Comfort and Wellbeing  Outcome: Ongoing (see interventions/notes)  Goal: Rounds/Family Conference  Outcome: Ongoing (see interventions/notes)     Problem: Fall Injury Risk  Goal: Absence of Fall and Fall-Related Injury  Outcome: Ongoing (see interventions/notes)     Problem: Acute Rehab Services Goal & Intervention  Plan  Goal: Bathing Goal  Description  Stand Alone Therapy Goal  Outcome: Ongoing (see interventions/notes)  Goal: Bed Mobility Goal  Description  Stand Alone Therapy Goal  Outcome: Ongoing (see interventions/notes)  Goal: Caregiver Training Goal  Description  Stand Alone Therapy Goal  Outcome: Ongoing (see interventions/notes)  Goal: Cognition Goal  Description  Stand Alone Therapy Goal  Outcome: Ongoing (see interventions/notes)  Goal: Cognition Goals, SLP  Description  Stand Alone Therapy Goal  Outcome: Ongoing (see interventions/notes)  Goal: Communication Goals, SLP  Description  Stand Alone Therapy Goal  Outcome: Ongoing (see interventions/notes)  Goal: Dysphagia Goals, SLP  Description  Stand Alone Therapy Goal  Outcome: Ongoing (see interventions/notes)  Goal: Eating Self-Feeding Goal  Description  Stand Alone Therapy Goal  Outcome: Ongoing (see interventions/notes)  Goal: Gait Training Goal  Description  Stand Alone Therapy Goal  Outcome: Ongoing (see interventions/notes)  Goal: Grooming Goal  Description  Stand Alone Therapy Goal  Outcome: Ongoing (see interventions/notes)  Goal: Home Management Goal  Description  Stand Alone Therapy Goal  Outcome: Ongoing (see interventions/notes)  Goal: Interprofessional Goal  Description  Stand Alone Therapy Goal  Outcome: Ongoing (see interventions/notes)  Goal: LB Dressing Goal  Description  Stand Alone Therapy Goal  Outcome: Ongoing (see interventions/notes)  Goal: Occupational Therapy Goals  Description  Stand Alone Therapy Goal  Outcome: Ongoing (see interventions/notes)  Goal: Physical Therapy Goal  Description  Stand Alone Therapy Goal  Outcome: Ongoing (see interventions/notes)  Goal: Range of Motion Goal  Description  Stand Alone Therapy Goal  Outcome: Ongoing (see interventions/notes)  Goal: Strength Goal  Description  Stand Alone Therapy Goal  Outcome: Ongoing (see interventions/notes)  Goal: Toileting Goal  Description  Stand Alone Therapy Goal  Outcome:  Ongoing (see interventions/notes)  Goal: Goal Transfer Training  Description  Stand Alone Therapy Goal  Outcome: Ongoing (see interventions/notes)  Goal: UB Dressing Goal  Description  Stand Alone Therapy Goal  Outcome: Ongoing (see interventions/notes)     Problem: Acute Rehab Services Goal & Intervention Plan  Goal: Bathing Goal  Description  Stand Alone Therapy Goal  Outcome: Ongoing (see interventions/notes)  Goal: Bed Mobility Goal  Description  Stand Alone Therapy Goal  Outcome: Ongoing (see interventions/notes)  Goal: Caregiver Training Goal  Description  Stand Alone Therapy Goal  Outcome: Ongoing (see interventions/notes)  Goal: Cognition Goal  Description  Stand Alone Therapy Goal  Outcome: Ongoing (see interventions/notes)  Goal: Cognition Goals, SLP  Description  Stand Alone Therapy Goal  Outcome: Ongoing (see interventions/notes)  Goal: Communication Goals, SLP  Description  Stand Alone Therapy Goal  Outcome: Ongoing (see interventions/notes)  Goal: Dysphagia Goals, SLP  Description  Stand Alone Therapy Goal  Outcome: Ongoing (see interventions/notes)  Goal: Eating Self-Feeding Goal  Description  Stand Alone Therapy Goal  Outcome: Ongoing (see interventions/notes)  Goal: Gait Training Goal  Description  Stand Alone Therapy Goal  Outcome: Ongoing (see interventions/notes)  Goal: Grooming Goal  Description  Stand Alone Therapy Goal  Outcome: Ongoing (see interventions/notes)  Goal: Home Management Goal  Description  Stand Alone Therapy Goal  Outcome: Ongoing (see interventions/notes)  Goal: Interprofessional Goal  Description  Stand Alone Therapy Goal  Outcome: Ongoing (see interventions/notes)  Goal: LB Dressing Goal  Description  Stand Alone Therapy Goal  Outcome: Ongoing (see interventions/notes)  Goal: Occupational Therapy Goals  Description  Stand Alone Therapy Goal  Outcome: Ongoing (see interventions/notes)  Goal: Physical Therapy Goal  Description  Stand Alone Therapy Goal  Outcome: Ongoing (see  interventions/notes)  Goal: Range of Motion Goal  Description  Stand Alone Therapy Goal  Outcome: Ongoing (see interventions/notes)  Goal: Strength Goal  Description  Stand Alone Therapy Goal  Outcome: Ongoing (see interventions/notes)  Goal: Toileting Goal  Description  Stand Alone Therapy Goal  Outcome: Ongoing (see interventions/notes)  Goal: Goal Transfer Training  Description  Stand Alone Therapy Goal  Outcome: Ongoing (see interventions/notes)  Goal: UB Dressing Goal  Description  Stand Alone Therapy Goal  Outcome: Ongoing (see interventions/notes)     Problem: Adjustment to Injury (Multiple Trauma)  Goal: Optimal Coping with Effects of Injury  Outcome: Ongoing (see interventions/notes)     Problem: Bleeding (Multiple Trauma)  Goal: Absence of Bleeding  Outcome: Ongoing (see interventions/notes)     Problem: Functional Ability Impaired (Multiple Trauma)  Goal: Optimal Functional Ability  Outcome: Ongoing (see interventions/notes)     Problem: Neurovascular Compromise (Multiple Trauma)  Goal: Effective Peripheral Tissue Perfusion  Outcome: Ongoing (see interventions/notes)     Problem: Pain (Multiple Trauma)  Goal: Acceptable Pain Control  Outcome: Ongoing (see interventions/notes)     Problem: Respiratory Compromise (Multiple Trauma)  Goal: Effective Oxygenation and Ventilation  Outcome: Ongoing (see interventions/notes)

## 2017-12-18 NOTE — Ancillary Notes (Signed)
SBIRT    SBIRT not completed during hospitalization because patient is not capable of participating in assessments due to trach, and only being able to read lips for assessment . Pending recommendations:  None.      Garlan Fair, [facility] , Clinical Therapist 12/18/2017, 08:07      Pager  [phone]

## 2017-12-18 NOTE — Care Management Notes (Signed)
Referral Information  ++++++ Placed Provider #1 ++++++  Case Manager: [facility]  Provider Type: [facility]  Provider Name: [facility]  Address:  [address]  [address]  [place], [place] [address]  Contact: Gerrit Friends    Phone: [phone] x  Fax:   Fax: [phone]

## 2017-12-30 ENCOUNTER — Ambulatory Visit (INDEPENDENT_AMBULATORY_CARE_PROVIDER_SITE_OTHER): Payer: Self-pay

## 2017-12-30 ENCOUNTER — Other Ambulatory Visit (HOSPITAL_COMMUNITY): Payer: Self-pay | Admitting: Physician Assistant

## 2017-12-30 NOTE — Telephone Encounter (Signed)
I spoke with Carollee Herter at [facility] who had questions regarding the patients aspen collar being removed. Henreitta Leber PA-C spoke with Dr Consuelo Pandy from spine who stated it could come off January 21, 2018. Dr Consuelo Pandy also asking if they could remove the patients sutures from her surgical incision. Carollee Herter stated that would not be a problem.       Claudia Pollock, RN  12/30/2017, 11:35
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
## 2018-01-21 ENCOUNTER — Encounter (INDEPENDENT_AMBULATORY_CARE_PROVIDER_SITE_OTHER): Payer: Self-pay | Admitting: Orthopaedic Surgery

## 2018-01-22 ENCOUNTER — Encounter (INDEPENDENT_AMBULATORY_CARE_PROVIDER_SITE_OTHER): Payer: Self-pay

## 2018-01-30 ENCOUNTER — Telehealth (INDEPENDENT_AMBULATORY_CARE_PROVIDER_SITE_OTHER): Payer: Self-pay | Admitting: Orthopaedic Surgery

## 2018-01-30 NOTE — Telephone Encounter (Signed)
Message from Wyvonnia Lora sent at 01/30/2018 10:49 AM EDT     Hi,  I spoke with mr. Eudelia Bunch. Jaspreet is following with an Ortho provider in [facility], her husband did not know his name. He said the the collar was removed last week.  Thanks, Stacy   Call History      Type Contact Phone User   01/30/2018 10:48 AM IB (Incoming) [place], [place] (Self)  [place], Lynnell Dike     FYI for the provider.  Durenda Hurt, RN  01/30/2018, 14:33

## 2018-02-02 ENCOUNTER — Ambulatory Visit (INDEPENDENT_AMBULATORY_CARE_PROVIDER_SITE_OTHER): Payer: Self-pay | Admitting: Physician Assistant

## 2018-02-02 NOTE — Telephone Encounter (Addendum)
-----   Message from Wyvonnia Lora sent at 01/30/2018 10:49 AM EDT -----  Hi,  I spoke with mr. Eudelia Bunch. Kenora is following with an Ortho provider in [facility], her husband did not know his name. He said the the collar was removed last week.  Thanks, Kennyth Arnold  --------------    Message received      Zola Button, PA-C  02/02/2018, 08:05

## 2018-02-26 NOTE — Anesthesia Postprocedure Evaluation (Signed)
Anesthesia Post Op Evaluation    Patient: Christy Turner  Procedure(s) with comments:  LAMINECTOMY SPINE CERVICAL POSTERIOR PRONE 3 LEVELS OR MORE - C4-C7  DECOMPRESSION SPINE CERVICAL POSTERIOR FUSION WITH INSTRUMENTATION 3 LEVELS OR MORE - C2-T4    Last Vitals:Temperature: 37.3 C (99.1 F) (12/17/17 1200)  Heart Rate: 71 (12/17/17 1400)  BP (Non-Invasive): (!) 150/79 (12/17/17 1400)  Respiratory Rate: 15 (12/17/17 1400)  SpO2: 100 % (12/17/17 1400)  Start Time: 7116 (12/17/17 5790)           Level of consciousness: anxious  Anesthetic complications: no

## 2018-02-26 NOTE — Addendum Note (Signed)
Addendum  created 02/26/18 1228 by Shona Simpson, MD    Sign clinical note

## 2018-03-05 ENCOUNTER — Encounter (INDEPENDENT_AMBULATORY_CARE_PROVIDER_SITE_OTHER): Payer: Self-pay

## 2018-03-12 ENCOUNTER — Encounter (INDEPENDENT_AMBULATORY_CARE_PROVIDER_SITE_OTHER): Payer: Self-pay

## 2018-03-12 ENCOUNTER — Ambulatory Visit: Payer: 59 | Attending: Physician Assistant | Admitting: Physician Assistant

## 2018-03-12 VITALS — BP 126/70 | HR 88 | Temp 97.0°F | Ht 65.0 in | Wt 141.0 lb

## 2018-03-12 DIAGNOSIS — Z6823 Body mass index (BMI) 23.0-23.9, adult: Secondary | ICD-10-CM

## 2018-03-12 DIAGNOSIS — S2243XD Multiple fractures of ribs, bilateral, subsequent encounter for fracture with routine healing: Secondary | ICD-10-CM | POA: Insufficient documentation

## 2018-03-12 DIAGNOSIS — S2243XA Multiple fractures of ribs, bilateral, initial encounter for closed fracture: Secondary | ICD-10-CM

## 2018-03-12 DIAGNOSIS — S14109A Unspecified injury at unspecified level of cervical spinal cord, initial encounter: Secondary | ICD-10-CM

## 2018-03-12 DIAGNOSIS — S14109D Unspecified injury at unspecified level of cervical spinal cord, subsequent encounter: Secondary | ICD-10-CM | POA: Insufficient documentation

## 2018-03-12 NOTE — Patient Instructions (Signed)
Please stop lovenox 03/19/18. DVT prophylaxis is complete 3 months after injury.  Patient needs to be straight cathed q4-6 hours; NO LONGER.  Please get pt OOB to bedside commode for bowel movements.  Follow up with PCP and spine from here out.
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
## 2018-05-20 ENCOUNTER — Ambulatory Visit (HOSPITAL_BASED_OUTPATIENT_CLINIC_OR_DEPARTMENT_OTHER): Payer: Auto Insurance (includes no fault)

## 2018-05-20 ENCOUNTER — Ambulatory Visit: Payer: Auto Insurance (includes no fault) | Attending: Orthopaedic Surgery | Admitting: Orthopaedic Surgery

## 2018-05-20 ENCOUNTER — Encounter (INDEPENDENT_AMBULATORY_CARE_PROVIDER_SITE_OTHER): Payer: Self-pay | Admitting: Orthopaedic Surgery

## 2018-05-20 DIAGNOSIS — Z8781 Personal history of (healed) traumatic fracture: Secondary | ICD-10-CM

## 2018-05-20 DIAGNOSIS — K119 Disease of salivary gland, unspecified: Secondary | ICD-10-CM

## 2018-05-20 DIAGNOSIS — Z981 Arthrodesis status: Secondary | ICD-10-CM | POA: Insufficient documentation

## 2018-05-20 DIAGNOSIS — R1310 Dysphagia, unspecified: Secondary | ICD-10-CM

## 2018-05-20 DIAGNOSIS — Z09 Encounter for follow-up examination after completed treatment for conditions other than malignant neoplasm: Secondary | ICD-10-CM | POA: Insufficient documentation

## 2018-05-20 DIAGNOSIS — S12200A Unspecified displaced fracture of third cervical vertebra, initial encounter for closed fracture: Secondary | ICD-10-CM

## 2018-05-20 DIAGNOSIS — S12100A Unspecified displaced fracture of second cervical vertebra, initial encounter for closed fracture: Secondary | ICD-10-CM

## 2018-05-20 NOTE — Patient Instructions (Signed)
SaboStretch - special brace to help prevent contractures of hands. Please ask Physical Medicine and Rehab specialist about this

## 2018-05-21 NOTE — Progress Notes (Signed)
PATIENT NAMEVELVEETA, Christy Turner   HOSPITAL NUMBER:  [id number]  DATE OF SERVICE: 05/20/2018  DATE OF BIRTH:  1945/08/11    PROGRESS NOTE    Date of surgery:  December 02, 2017    REFERRING PROVIDER:  .  Fenton Foy, PA.    NAME OF PROCEDURE:  Posterior treatment of C2, C3, C5 and C6 fractures and decompression of spinal cord with C3-C7 laminectomy and C2-T2 posterior instrumented fusion.    SUBJECTIVE:  The patient is a 73 year old Caucasian female who presents to clinic today 5 months status post the above-mentioned surgery.  The patient has not been seen since surgery.  She was in the ICU for an extended period of time for treatment of her various injuries.  The patient also had a trach in place previously and notes difficulty swallowing solid food, soft food as well as liquids.  She is currently residing in a rehab facility in [place], [place], and has a physical medicine and rehab doctor at [facility] in [place].  The patient states that she does stand with braces that cover her entire lower extremities.  She is frustrated that she does not have the use of her hands and she is also complaining of vocal hoarseness that has been persistent since the posterior neck surgery.  It does not seem to be improving.  She has a history of macular degeneration and feels that her vision in the right eye has been worse as well as hearing in her right ear.  She has no sensation to her rectum and is unable to feel the urge to pass stool.  She states she is on for stool softeners and still is having difficulty softening her stools.  She has sensation of her bladder and can tell when she has to urinate; however, she is unable to use the bathroom by herself.  She states they were supposed to straight catheter at 6 o'clock, 12 o'clock and then again at 6 o'clock in the evening and midnight; however, a lot of times they get off schedule and she does not get straight cathed 4 times a day.  She admits to pain in her  buttocks all the time; however, it has been worse today since she is out of her chair but even lying in her bed increases her pain across the buttocks.  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  She has concerns about developing flexion contractures of her fingers and the ulnar digits of the bilateral hands.  She has been continuing to work with therapy and has regained some of her proximal muscle strength in the upper extremities.    OBJECTIVE:  On exam, the patient is alert and oriented, in no acute distress seated comfortably in her wheelchair.  Posterior cervical incision is healing well without signs of infection such as warmth, erythema or drainage.  Bilaterally she is 5/5 for deltoid.  Left-sided biceps and triceps were 5+.  Triceps on the right is 3/5 and biceps is 4-.  Sensation is intact in a C5 dermatome, otherwise it is diminished bilaterally in a symmetric fashion in the remaining distal muscle groups.  Pulses are 2+ bilaterally at the wrists.  Unable to assess capillary refill secondary to nail polish.  The patient is 3+ in the bilateral lower extremities for patella and Achilles reflexes with 4 beats of ankle clonus bilaterally.  Positive Hoffmann's bilaterally, positive reverse brachioradialis bilaterally, 3+ bilateral biceps and  triceps reflexes.  The patient is unable to completely extend her fingers passively; however, it is worse in the ulnar digits bilaterally.    IMAGING:  The patient had AP lateral flexion and extension films obtained and reviewed in clinic today alongside Dr. Herbie Saxon of her cervical spine.  Overall, he is pleased with the alignment.  No signs of hardware fatigue or breakdown are noted as well as the previous ACDF.  There are no significant changes there.    ASSESSMENT AND PLAN:  Discussed with the patient at this time.  We encouraged her that she can continue noting improvement from a spinal cord injury for about  15 months out.  We discussed that she should continue doing what she is doing in rehab.  Included on the after visit summary is something that she can ask her physical medicine and rehab doctor about called a Saebo Stretch that can help prevent the flexion contractures in her fingers.  From a surgical standpoint, she can return to clinic in about 6 months which puts her approximately 1 year out from surgery or sooner if she is having any trouble.  At the next visit, if  she is coming back for her cervical spine, we will need AP/lateral flexion-extension films.  The patient voiced understanding and was agreeable.  She can contact us in the meantime with any questions or concerns.  Of note, Dr. Herbie Saxon had also referred her to ENT for the dysphagia and vocal hoarseness that she is complaining of today.        Adolm Joseph, PA-C    I personally saw and examined the patient. See mid-level's note for additional details. My findings are generally doing well, still with significant neurologic deficit, follows with PM&R at [facility]. Recommend she continue PT/OT. She thinks neurologically she has pretty much plateaued, in which case, I would not anticipate much more improvement.     Neta Ehlers, MD  Professor   [place] [facility] Department of Orthopaedics               CC:   Theadora Rama [place], [place]       DD:  05/20/2018 16:06:13  DT:  05/21/2018 10:04:05 JB  D#:  [id number]

## 2018-05-28 ENCOUNTER — Encounter (INDEPENDENT_AMBULATORY_CARE_PROVIDER_SITE_OTHER): Payer: Self-pay | Admitting: Physician Assistant

## 2018-06-08 DIAGNOSIS — R1314 Dysphagia, pharyngoesophageal phase: Secondary | ICD-10-CM | POA: Insufficient documentation

## 2018-08-19 DIAGNOSIS — R252 Cramp and spasm: Secondary | ICD-10-CM | POA: Insufficient documentation

## 2018-11-18 ENCOUNTER — Ambulatory Visit: Payer: Medicare HMO | Attending: Orthopaedic Surgery | Admitting: Orthopaedic Surgery

## 2018-11-18 ENCOUNTER — Other Ambulatory Visit: Payer: Self-pay

## 2018-11-18 ENCOUNTER — Ambulatory Visit (HOSPITAL_BASED_OUTPATIENT_CLINIC_OR_DEPARTMENT_OTHER): Payer: Medicare HMO

## 2018-11-18 DIAGNOSIS — Z4789 Encounter for other orthopedic aftercare: Secondary | ICD-10-CM | POA: Insufficient documentation

## 2018-11-18 DIAGNOSIS — Z981 Arthrodesis status: Secondary | ICD-10-CM

## 2018-11-18 DIAGNOSIS — G959 Disease of spinal cord, unspecified: Secondary | ICD-10-CM

## 2018-11-18 DIAGNOSIS — T148XXS Other injury of unspecified body region, sequela: Secondary | ICD-10-CM | POA: Insufficient documentation

## 2018-11-18 DIAGNOSIS — R252 Cramp and spasm: Secondary | ICD-10-CM | POA: Insufficient documentation

## 2018-11-18 NOTE — Progress Notes (Signed)
[facility]  Department of Orthopaedics    DATE OF SERVICE: 11/18/2018    DATE OF SURGERY: 12/02/2017    PROCEDURE: C3-7 Laminectomy, PCF C2-T2 (C2, C3, C5, C6 fx c SCI)    Ms. Belson is now almost 1 Year s/p above procedure. She isstill in rehab/SNF facility in [place]. Biggest issue is spasms, notes saw PM&R at [facility] (appears to be Dr. Lisabeth Devoid per Epic) for some Botox injections recently, which she did not think helped much. No other complaints today.    Awake, alert, no acute distress  Seated in motorized WC   D B WE T FF HI  R 3 4+ 4 4+ 4+ 0  L 3 4+ 3 4 4+ 3  Sensation to LT unchanged  Incision healed    Imaging studies: XR CSP ap/lat/flex/ext taken in clinic today, reviewed in PACS, my interpretation is maintained overall alignment, maybe a little lucency around C7 screws, no instability; I think functionally the fusion is stable    I reviewed the above findings with the patient. Most of her issues now really are chronic sequelae of spinal cord injury. She needs continued aggressive PT/OT at her facility for joint ROM and strengthenting to prevent contractures. She mentioned that PM&R suggested possible Baclofen pump for her spasticity. She notes she has chronic UTI's and has a foley. Really, her most pressing need is for continued follow up and management of her chronic SCI with her PM&R physician.  She is a year out from surgery and fusion appears stable; there really is not much else to do for her from a surgical standpoint. From that standpoint, and given the distance she travels to get here, she really can just follow up with me as needed. She is in agreement with the plan and all her questions were answered.    Imaging at next visit: TBD    Terald Sleeper, MD 11/18/2018, 10:32  Professor  Department of Orthopaedics

## 2019-01-22 DIAGNOSIS — S14109S Unspecified injury at unspecified level of cervical spinal cord, sequela: Secondary | ICD-10-CM | POA: Diagnosis not present

## 2019-01-22 DIAGNOSIS — G8250 Quadriplegia, unspecified: Secondary | ICD-10-CM | POA: Diagnosis not present

## 2019-01-22 DIAGNOSIS — Z978 Presence of other specified devices: Secondary | ICD-10-CM | POA: Diagnosis not present

## 2019-01-22 DIAGNOSIS — K5909 Other constipation: Secondary | ICD-10-CM | POA: Diagnosis not present

## 2019-01-25 DIAGNOSIS — K5909 Other constipation: Secondary | ICD-10-CM | POA: Insufficient documentation

## 2019-01-26 DIAGNOSIS — E039 Hypothyroidism, unspecified: Secondary | ICD-10-CM | POA: Insufficient documentation

## 2019-01-27 DIAGNOSIS — S90822D Blister (nonthermal), left foot, subsequent encounter: Secondary | ICD-10-CM | POA: Diagnosis not present

## 2019-01-27 DIAGNOSIS — S90821D Blister (nonthermal), right foot, subsequent encounter: Secondary | ICD-10-CM | POA: Diagnosis not present

## 2019-01-27 DIAGNOSIS — Z993 Dependence on wheelchair: Secondary | ICD-10-CM | POA: Diagnosis not present

## 2019-01-27 DIAGNOSIS — Z466 Encounter for fitting and adjustment of urinary device: Secondary | ICD-10-CM | POA: Diagnosis not present

## 2019-01-27 DIAGNOSIS — G8252 Quadriplegia, C1-C4 incomplete: Secondary | ICD-10-CM | POA: Diagnosis not present

## 2019-01-27 DIAGNOSIS — Z79899 Other long term (current) drug therapy: Secondary | ICD-10-CM | POA: Diagnosis not present

## 2019-01-27 DIAGNOSIS — Z9181 History of falling: Secondary | ICD-10-CM | POA: Diagnosis not present

## 2019-01-27 DIAGNOSIS — S14102S Unspecified injury at C2 level of cervical spinal cord, sequela: Secondary | ICD-10-CM | POA: Diagnosis not present

## 2019-01-27 DIAGNOSIS — Z792 Long term (current) use of antibiotics: Secondary | ICD-10-CM | POA: Diagnosis not present

## 2019-01-27 DIAGNOSIS — K5904 Chronic idiopathic constipation: Secondary | ICD-10-CM | POA: Diagnosis not present

## 2019-01-27 DIAGNOSIS — L89322 Pressure ulcer of left buttock, stage 2: Secondary | ICD-10-CM | POA: Diagnosis not present

## 2019-02-01 DIAGNOSIS — Z9181 History of falling: Secondary | ICD-10-CM | POA: Diagnosis not present

## 2019-02-01 DIAGNOSIS — Z466 Encounter for fitting and adjustment of urinary device: Secondary | ICD-10-CM | POA: Diagnosis not present

## 2019-02-01 DIAGNOSIS — Z79899 Other long term (current) drug therapy: Secondary | ICD-10-CM | POA: Diagnosis not present

## 2019-02-01 DIAGNOSIS — S90822D Blister (nonthermal), left foot, subsequent encounter: Secondary | ICD-10-CM | POA: Diagnosis not present

## 2019-02-01 DIAGNOSIS — Z993 Dependence on wheelchair: Secondary | ICD-10-CM | POA: Diagnosis not present

## 2019-02-01 DIAGNOSIS — G8252 Quadriplegia, C1-C4 incomplete: Secondary | ICD-10-CM | POA: Diagnosis not present

## 2019-02-01 DIAGNOSIS — S90821D Blister (nonthermal), right foot, subsequent encounter: Secondary | ICD-10-CM | POA: Diagnosis not present

## 2019-02-01 DIAGNOSIS — K5904 Chronic idiopathic constipation: Secondary | ICD-10-CM | POA: Diagnosis not present

## 2019-02-01 DIAGNOSIS — Z792 Long term (current) use of antibiotics: Secondary | ICD-10-CM | POA: Diagnosis not present

## 2019-02-01 DIAGNOSIS — L89322 Pressure ulcer of left buttock, stage 2: Secondary | ICD-10-CM | POA: Diagnosis not present

## 2019-02-01 DIAGNOSIS — S14102S Unspecified injury at C2 level of cervical spinal cord, sequela: Secondary | ICD-10-CM | POA: Diagnosis not present

## 2019-02-03 DIAGNOSIS — Z8719 Personal history of other diseases of the digestive system: Secondary | ICD-10-CM | POA: Diagnosis not present

## 2019-02-03 DIAGNOSIS — E039 Hypothyroidism, unspecified: Secondary | ICD-10-CM | POA: Diagnosis not present

## 2019-02-03 DIAGNOSIS — G35 Multiple sclerosis: Secondary | ICD-10-CM | POA: Diagnosis not present

## 2019-02-03 DIAGNOSIS — K5909 Other constipation: Secondary | ICD-10-CM | POA: Diagnosis not present

## 2019-02-03 DIAGNOSIS — G8250 Quadriplegia, unspecified: Secondary | ICD-10-CM | POA: Diagnosis not present

## 2019-02-03 DIAGNOSIS — F5101 Primary insomnia: Secondary | ICD-10-CM | POA: Diagnosis not present

## 2019-02-03 DIAGNOSIS — Z978 Presence of other specified devices: Secondary | ICD-10-CM | POA: Diagnosis not present

## 2019-02-03 DIAGNOSIS — G4700 Insomnia, unspecified: Secondary | ICD-10-CM | POA: Diagnosis not present

## 2019-02-03 DIAGNOSIS — J302 Other seasonal allergic rhinitis: Secondary | ICD-10-CM | POA: Diagnosis not present

## 2019-02-03 DIAGNOSIS — S14109S Unspecified injury at unspecified level of cervical spinal cord, sequela: Secondary | ICD-10-CM | POA: Diagnosis not present

## 2019-02-03 DIAGNOSIS — G8929 Other chronic pain: Secondary | ICD-10-CM | POA: Diagnosis not present

## 2019-02-03 DIAGNOSIS — H6121 Impacted cerumen, right ear: Secondary | ICD-10-CM | POA: Diagnosis not present

## 2019-02-03 DIAGNOSIS — Z8711 Personal history of peptic ulcer disease: Secondary | ICD-10-CM | POA: Insufficient documentation

## 2019-02-03 DIAGNOSIS — J329 Chronic sinusitis, unspecified: Secondary | ICD-10-CM | POA: Diagnosis not present

## 2019-02-11 DIAGNOSIS — G8252 Quadriplegia, C1-C4 incomplete: Secondary | ICD-10-CM | POA: Diagnosis not present

## 2019-02-11 DIAGNOSIS — S14102S Unspecified injury at C2 level of cervical spinal cord, sequela: Secondary | ICD-10-CM | POA: Diagnosis not present

## 2019-02-11 DIAGNOSIS — S90821D Blister (nonthermal), right foot, subsequent encounter: Secondary | ICD-10-CM | POA: Diagnosis not present

## 2019-02-11 DIAGNOSIS — Z466 Encounter for fitting and adjustment of urinary device: Secondary | ICD-10-CM | POA: Diagnosis not present

## 2019-02-11 DIAGNOSIS — S90822D Blister (nonthermal), left foot, subsequent encounter: Secondary | ICD-10-CM | POA: Diagnosis not present

## 2019-02-11 DIAGNOSIS — Z792 Long term (current) use of antibiotics: Secondary | ICD-10-CM | POA: Diagnosis not present

## 2019-02-11 DIAGNOSIS — L89322 Pressure ulcer of left buttock, stage 2: Secondary | ICD-10-CM | POA: Diagnosis not present

## 2019-02-11 DIAGNOSIS — Z9181 History of falling: Secondary | ICD-10-CM | POA: Diagnosis not present

## 2019-02-11 DIAGNOSIS — Z79899 Other long term (current) drug therapy: Secondary | ICD-10-CM | POA: Diagnosis not present

## 2019-02-11 DIAGNOSIS — Z993 Dependence on wheelchair: Secondary | ICD-10-CM | POA: Diagnosis not present

## 2019-02-11 DIAGNOSIS — K5904 Chronic idiopathic constipation: Secondary | ICD-10-CM | POA: Diagnosis not present

## 2019-02-12 DIAGNOSIS — M24549 Contracture, unspecified hand: Secondary | ICD-10-CM | POA: Diagnosis not present

## 2019-02-12 DIAGNOSIS — G8252 Quadriplegia, C1-C4 incomplete: Secondary | ICD-10-CM | POA: Diagnosis not present

## 2019-02-12 DIAGNOSIS — S14102S Unspecified injury at C2 level of cervical spinal cord, sequela: Secondary | ICD-10-CM | POA: Diagnosis not present

## 2019-02-15 DIAGNOSIS — J329 Chronic sinusitis, unspecified: Secondary | ICD-10-CM | POA: Diagnosis not present

## 2019-02-15 DIAGNOSIS — J40 Bronchitis, not specified as acute or chronic: Secondary | ICD-10-CM | POA: Diagnosis not present

## 2019-02-15 DIAGNOSIS — R05 Cough: Secondary | ICD-10-CM | POA: Diagnosis not present

## 2019-02-16 DIAGNOSIS — R05 Cough: Secondary | ICD-10-CM | POA: Diagnosis not present

## 2019-02-17 DIAGNOSIS — J40 Bronchitis, not specified as acute or chronic: Secondary | ICD-10-CM | POA: Diagnosis not present

## 2019-02-17 DIAGNOSIS — S90822D Blister (nonthermal), left foot, subsequent encounter: Secondary | ICD-10-CM | POA: Diagnosis not present

## 2019-02-17 DIAGNOSIS — G8252 Quadriplegia, C1-C4 incomplete: Secondary | ICD-10-CM | POA: Diagnosis not present

## 2019-02-17 DIAGNOSIS — S90821D Blister (nonthermal), right foot, subsequent encounter: Secondary | ICD-10-CM | POA: Diagnosis not present

## 2019-02-17 DIAGNOSIS — Z9181 History of falling: Secondary | ICD-10-CM | POA: Diagnosis not present

## 2019-02-17 DIAGNOSIS — Z466 Encounter for fitting and adjustment of urinary device: Secondary | ICD-10-CM | POA: Diagnosis not present

## 2019-02-17 DIAGNOSIS — K5904 Chronic idiopathic constipation: Secondary | ICD-10-CM | POA: Diagnosis not present

## 2019-02-17 DIAGNOSIS — J329 Chronic sinusitis, unspecified: Secondary | ICD-10-CM | POA: Diagnosis not present

## 2019-02-17 DIAGNOSIS — Z993 Dependence on wheelchair: Secondary | ICD-10-CM | POA: Diagnosis not present

## 2019-02-17 DIAGNOSIS — L89322 Pressure ulcer of left buttock, stage 2: Secondary | ICD-10-CM | POA: Diagnosis not present

## 2019-02-17 DIAGNOSIS — H6121 Impacted cerumen, right ear: Secondary | ICD-10-CM | POA: Diagnosis not present

## 2019-02-17 DIAGNOSIS — Z792 Long term (current) use of antibiotics: Secondary | ICD-10-CM | POA: Diagnosis not present

## 2019-02-17 DIAGNOSIS — S14102S Unspecified injury at C2 level of cervical spinal cord, sequela: Secondary | ICD-10-CM | POA: Diagnosis not present

## 2019-02-17 DIAGNOSIS — Z79899 Other long term (current) drug therapy: Secondary | ICD-10-CM | POA: Diagnosis not present

## 2019-02-23 DIAGNOSIS — L89322 Pressure ulcer of left buttock, stage 2: Secondary | ICD-10-CM | POA: Diagnosis not present

## 2019-02-23 DIAGNOSIS — Z9181 History of falling: Secondary | ICD-10-CM | POA: Diagnosis not present

## 2019-02-23 DIAGNOSIS — G8252 Quadriplegia, C1-C4 incomplete: Secondary | ICD-10-CM | POA: Diagnosis not present

## 2019-02-23 DIAGNOSIS — Z792 Long term (current) use of antibiotics: Secondary | ICD-10-CM | POA: Diagnosis not present

## 2019-02-23 DIAGNOSIS — S14102S Unspecified injury at C2 level of cervical spinal cord, sequela: Secondary | ICD-10-CM | POA: Diagnosis not present

## 2019-02-23 DIAGNOSIS — Z466 Encounter for fitting and adjustment of urinary device: Secondary | ICD-10-CM | POA: Diagnosis not present

## 2019-02-23 DIAGNOSIS — S90821D Blister (nonthermal), right foot, subsequent encounter: Secondary | ICD-10-CM | POA: Diagnosis not present

## 2019-02-23 DIAGNOSIS — S90822D Blister (nonthermal), left foot, subsequent encounter: Secondary | ICD-10-CM | POA: Diagnosis not present

## 2019-02-23 DIAGNOSIS — Z993 Dependence on wheelchair: Secondary | ICD-10-CM | POA: Diagnosis not present

## 2019-02-23 DIAGNOSIS — Z79899 Other long term (current) drug therapy: Secondary | ICD-10-CM | POA: Diagnosis not present

## 2019-02-23 DIAGNOSIS — K5904 Chronic idiopathic constipation: Secondary | ICD-10-CM | POA: Diagnosis not present

## 2019-02-24 DIAGNOSIS — L89322 Pressure ulcer of left buttock, stage 2: Secondary | ICD-10-CM | POA: Diagnosis not present

## 2019-02-24 DIAGNOSIS — K5904 Chronic idiopathic constipation: Secondary | ICD-10-CM | POA: Diagnosis not present

## 2019-02-24 DIAGNOSIS — Z993 Dependence on wheelchair: Secondary | ICD-10-CM | POA: Diagnosis not present

## 2019-02-24 DIAGNOSIS — G8252 Quadriplegia, C1-C4 incomplete: Secondary | ICD-10-CM | POA: Diagnosis not present

## 2019-02-24 DIAGNOSIS — Z79899 Other long term (current) drug therapy: Secondary | ICD-10-CM | POA: Diagnosis not present

## 2019-02-24 DIAGNOSIS — S14102S Unspecified injury at C2 level of cervical spinal cord, sequela: Secondary | ICD-10-CM | POA: Diagnosis not present

## 2019-02-24 DIAGNOSIS — Z792 Long term (current) use of antibiotics: Secondary | ICD-10-CM | POA: Diagnosis not present

## 2019-02-24 DIAGNOSIS — Z466 Encounter for fitting and adjustment of urinary device: Secondary | ICD-10-CM | POA: Diagnosis not present

## 2019-02-24 DIAGNOSIS — S90821D Blister (nonthermal), right foot, subsequent encounter: Secondary | ICD-10-CM | POA: Diagnosis not present

## 2019-02-24 DIAGNOSIS — S90822D Blister (nonthermal), left foot, subsequent encounter: Secondary | ICD-10-CM | POA: Diagnosis not present

## 2019-02-24 DIAGNOSIS — Z9181 History of falling: Secondary | ICD-10-CM | POA: Diagnosis not present

## 2019-02-26 DIAGNOSIS — G35 Multiple sclerosis: Secondary | ICD-10-CM | POA: Diagnosis not present

## 2019-02-26 DIAGNOSIS — G8250 Quadriplegia, unspecified: Secondary | ICD-10-CM | POA: Diagnosis not present

## 2019-02-26 DIAGNOSIS — S14109S Unspecified injury at unspecified level of cervical spinal cord, sequela: Secondary | ICD-10-CM | POA: Diagnosis not present

## 2019-03-02 DIAGNOSIS — S14109S Unspecified injury at unspecified level of cervical spinal cord, sequela: Secondary | ICD-10-CM | POA: Diagnosis not present

## 2019-03-02 DIAGNOSIS — B9689 Other specified bacterial agents as the cause of diseases classified elsewhere: Secondary | ICD-10-CM | POA: Diagnosis not present

## 2019-03-02 DIAGNOSIS — J208 Acute bronchitis due to other specified organisms: Secondary | ICD-10-CM | POA: Diagnosis not present

## 2019-03-02 DIAGNOSIS — G8250 Quadriplegia, unspecified: Secondary | ICD-10-CM | POA: Diagnosis not present

## 2019-03-02 DIAGNOSIS — N390 Urinary tract infection, site not specified: Secondary | ICD-10-CM | POA: Diagnosis not present

## 2019-03-08 DIAGNOSIS — L89322 Pressure ulcer of left buttock, stage 2: Secondary | ICD-10-CM | POA: Diagnosis not present

## 2019-03-08 DIAGNOSIS — S90821D Blister (nonthermal), right foot, subsequent encounter: Secondary | ICD-10-CM | POA: Diagnosis not present

## 2019-03-08 DIAGNOSIS — Z9181 History of falling: Secondary | ICD-10-CM | POA: Diagnosis not present

## 2019-03-08 DIAGNOSIS — Z993 Dependence on wheelchair: Secondary | ICD-10-CM | POA: Diagnosis not present

## 2019-03-08 DIAGNOSIS — Z79899 Other long term (current) drug therapy: Secondary | ICD-10-CM | POA: Diagnosis not present

## 2019-03-08 DIAGNOSIS — Z792 Long term (current) use of antibiotics: Secondary | ICD-10-CM | POA: Diagnosis not present

## 2019-03-08 DIAGNOSIS — Z466 Encounter for fitting and adjustment of urinary device: Secondary | ICD-10-CM | POA: Diagnosis not present

## 2019-03-08 DIAGNOSIS — K5904 Chronic idiopathic constipation: Secondary | ICD-10-CM | POA: Diagnosis not present

## 2019-03-08 DIAGNOSIS — S90822D Blister (nonthermal), left foot, subsequent encounter: Secondary | ICD-10-CM | POA: Diagnosis not present

## 2019-03-08 DIAGNOSIS — G8252 Quadriplegia, C1-C4 incomplete: Secondary | ICD-10-CM | POA: Diagnosis not present

## 2019-03-08 DIAGNOSIS — S14102S Unspecified injury at C2 level of cervical spinal cord, sequela: Secondary | ICD-10-CM | POA: Diagnosis not present

## 2019-03-10 DIAGNOSIS — Z79899 Other long term (current) drug therapy: Secondary | ICD-10-CM | POA: Diagnosis not present

## 2019-03-10 DIAGNOSIS — Z9181 History of falling: Secondary | ICD-10-CM | POA: Diagnosis not present

## 2019-03-10 DIAGNOSIS — Z993 Dependence on wheelchair: Secondary | ICD-10-CM | POA: Diagnosis not present

## 2019-03-10 DIAGNOSIS — S14102S Unspecified injury at C2 level of cervical spinal cord, sequela: Secondary | ICD-10-CM | POA: Diagnosis not present

## 2019-03-10 DIAGNOSIS — G8252 Quadriplegia, C1-C4 incomplete: Secondary | ICD-10-CM | POA: Diagnosis not present

## 2019-03-10 DIAGNOSIS — S90822D Blister (nonthermal), left foot, subsequent encounter: Secondary | ICD-10-CM | POA: Diagnosis not present

## 2019-03-10 DIAGNOSIS — L89322 Pressure ulcer of left buttock, stage 2: Secondary | ICD-10-CM | POA: Diagnosis not present

## 2019-03-10 DIAGNOSIS — K5904 Chronic idiopathic constipation: Secondary | ICD-10-CM | POA: Diagnosis not present

## 2019-03-10 DIAGNOSIS — Z792 Long term (current) use of antibiotics: Secondary | ICD-10-CM | POA: Diagnosis not present

## 2019-03-10 DIAGNOSIS — S90821D Blister (nonthermal), right foot, subsequent encounter: Secondary | ICD-10-CM | POA: Diagnosis not present

## 2019-03-10 DIAGNOSIS — Z466 Encounter for fitting and adjustment of urinary device: Secondary | ICD-10-CM | POA: Diagnosis not present

## 2019-03-15 DIAGNOSIS — H6122 Impacted cerumen, left ear: Secondary | ICD-10-CM | POA: Diagnosis not present

## 2019-03-15 DIAGNOSIS — S00411A Abrasion of right ear, initial encounter: Secondary | ICD-10-CM | POA: Diagnosis not present

## 2019-03-15 DIAGNOSIS — R1310 Dysphagia, unspecified: Secondary | ICD-10-CM | POA: Diagnosis not present

## 2019-03-16 DIAGNOSIS — Z792 Long term (current) use of antibiotics: Secondary | ICD-10-CM | POA: Diagnosis not present

## 2019-03-16 DIAGNOSIS — Z993 Dependence on wheelchair: Secondary | ICD-10-CM | POA: Diagnosis not present

## 2019-03-16 DIAGNOSIS — Z9181 History of falling: Secondary | ICD-10-CM | POA: Diagnosis not present

## 2019-03-16 DIAGNOSIS — K5904 Chronic idiopathic constipation: Secondary | ICD-10-CM | POA: Diagnosis not present

## 2019-03-16 DIAGNOSIS — S14102S Unspecified injury at C2 level of cervical spinal cord, sequela: Secondary | ICD-10-CM | POA: Diagnosis not present

## 2019-03-16 DIAGNOSIS — G8252 Quadriplegia, C1-C4 incomplete: Secondary | ICD-10-CM | POA: Diagnosis not present

## 2019-03-16 DIAGNOSIS — S90822D Blister (nonthermal), left foot, subsequent encounter: Secondary | ICD-10-CM | POA: Diagnosis not present

## 2019-03-16 DIAGNOSIS — Z466 Encounter for fitting and adjustment of urinary device: Secondary | ICD-10-CM | POA: Diagnosis not present

## 2019-03-16 DIAGNOSIS — S90821D Blister (nonthermal), right foot, subsequent encounter: Secondary | ICD-10-CM | POA: Diagnosis not present

## 2019-03-16 DIAGNOSIS — L89322 Pressure ulcer of left buttock, stage 2: Secondary | ICD-10-CM | POA: Diagnosis not present

## 2019-03-16 DIAGNOSIS — Z79899 Other long term (current) drug therapy: Secondary | ICD-10-CM | POA: Diagnosis not present

## 2019-03-21 DIAGNOSIS — S90821D Blister (nonthermal), right foot, subsequent encounter: Secondary | ICD-10-CM | POA: Diagnosis not present

## 2019-03-21 DIAGNOSIS — L89322 Pressure ulcer of left buttock, stage 2: Secondary | ICD-10-CM | POA: Diagnosis not present

## 2019-03-21 DIAGNOSIS — S90822D Blister (nonthermal), left foot, subsequent encounter: Secondary | ICD-10-CM | POA: Diagnosis not present

## 2019-03-22 DIAGNOSIS — Z466 Encounter for fitting and adjustment of urinary device: Secondary | ICD-10-CM | POA: Diagnosis not present

## 2019-03-22 DIAGNOSIS — S90821D Blister (nonthermal), right foot, subsequent encounter: Secondary | ICD-10-CM | POA: Diagnosis not present

## 2019-03-22 DIAGNOSIS — Z79899 Other long term (current) drug therapy: Secondary | ICD-10-CM | POA: Diagnosis not present

## 2019-03-22 DIAGNOSIS — L89322 Pressure ulcer of left buttock, stage 2: Secondary | ICD-10-CM | POA: Diagnosis not present

## 2019-03-22 DIAGNOSIS — S90822D Blister (nonthermal), left foot, subsequent encounter: Secondary | ICD-10-CM | POA: Diagnosis not present

## 2019-03-22 DIAGNOSIS — K5904 Chronic idiopathic constipation: Secondary | ICD-10-CM | POA: Diagnosis not present

## 2019-03-22 DIAGNOSIS — Z792 Long term (current) use of antibiotics: Secondary | ICD-10-CM | POA: Diagnosis not present

## 2019-03-22 DIAGNOSIS — Z993 Dependence on wheelchair: Secondary | ICD-10-CM | POA: Diagnosis not present

## 2019-03-22 DIAGNOSIS — G8252 Quadriplegia, C1-C4 incomplete: Secondary | ICD-10-CM | POA: Diagnosis not present

## 2019-03-22 DIAGNOSIS — S14102S Unspecified injury at C2 level of cervical spinal cord, sequela: Secondary | ICD-10-CM | POA: Diagnosis not present

## 2019-03-22 DIAGNOSIS — Z9181 History of falling: Secondary | ICD-10-CM | POA: Diagnosis not present

## 2019-03-23 DIAGNOSIS — Z9181 History of falling: Secondary | ICD-10-CM | POA: Diagnosis not present

## 2019-03-23 DIAGNOSIS — Z888 Allergy status to other drugs, medicaments and biological substances status: Secondary | ICD-10-CM | POA: Diagnosis not present

## 2019-03-23 DIAGNOSIS — S14102S Unspecified injury at C2 level of cervical spinal cord, sequela: Secondary | ICD-10-CM | POA: Diagnosis not present

## 2019-03-23 DIAGNOSIS — S90822D Blister (nonthermal), left foot, subsequent encounter: Secondary | ICD-10-CM | POA: Diagnosis not present

## 2019-03-23 DIAGNOSIS — G8252 Quadriplegia, C1-C4 incomplete: Secondary | ICD-10-CM | POA: Diagnosis not present

## 2019-03-23 DIAGNOSIS — R1030 Lower abdominal pain, unspecified: Secondary | ICD-10-CM | POA: Diagnosis not present

## 2019-03-23 DIAGNOSIS — T83018A Breakdown (mechanical) of other indwelling urethral catheter, initial encounter: Secondary | ICD-10-CM | POA: Diagnosis not present

## 2019-03-23 DIAGNOSIS — Z993 Dependence on wheelchair: Secondary | ICD-10-CM | POA: Diagnosis not present

## 2019-03-23 DIAGNOSIS — Z792 Long term (current) use of antibiotics: Secondary | ICD-10-CM | POA: Diagnosis not present

## 2019-03-23 DIAGNOSIS — K5904 Chronic idiopathic constipation: Secondary | ICD-10-CM | POA: Diagnosis not present

## 2019-03-23 DIAGNOSIS — Z466 Encounter for fitting and adjustment of urinary device: Secondary | ICD-10-CM | POA: Diagnosis not present

## 2019-03-23 DIAGNOSIS — T859XXA Unspecified complication of internal prosthetic device, implant and graft, initial encounter: Secondary | ICD-10-CM | POA: Diagnosis not present

## 2019-03-23 DIAGNOSIS — Z79899 Other long term (current) drug therapy: Secondary | ICD-10-CM | POA: Diagnosis not present

## 2019-03-23 DIAGNOSIS — R32 Unspecified urinary incontinence: Secondary | ICD-10-CM | POA: Diagnosis not present

## 2019-03-23 DIAGNOSIS — S90821D Blister (nonthermal), right foot, subsequent encounter: Secondary | ICD-10-CM | POA: Diagnosis not present

## 2019-03-23 DIAGNOSIS — L89322 Pressure ulcer of left buttock, stage 2: Secondary | ICD-10-CM | POA: Diagnosis not present

## 2019-03-24 DIAGNOSIS — Z792 Long term (current) use of antibiotics: Secondary | ICD-10-CM | POA: Diagnosis not present

## 2019-03-24 DIAGNOSIS — G8252 Quadriplegia, C1-C4 incomplete: Secondary | ICD-10-CM | POA: Diagnosis not present

## 2019-03-24 DIAGNOSIS — Z79899 Other long term (current) drug therapy: Secondary | ICD-10-CM | POA: Diagnosis not present

## 2019-03-24 DIAGNOSIS — S14102S Unspecified injury at C2 level of cervical spinal cord, sequela: Secondary | ICD-10-CM | POA: Diagnosis not present

## 2019-03-24 DIAGNOSIS — S90821D Blister (nonthermal), right foot, subsequent encounter: Secondary | ICD-10-CM | POA: Diagnosis not present

## 2019-03-24 DIAGNOSIS — S90822D Blister (nonthermal), left foot, subsequent encounter: Secondary | ICD-10-CM | POA: Diagnosis not present

## 2019-03-24 DIAGNOSIS — Z466 Encounter for fitting and adjustment of urinary device: Secondary | ICD-10-CM | POA: Diagnosis not present

## 2019-03-24 DIAGNOSIS — L89322 Pressure ulcer of left buttock, stage 2: Secondary | ICD-10-CM | POA: Diagnosis not present

## 2019-03-24 DIAGNOSIS — Z9181 History of falling: Secondary | ICD-10-CM | POA: Diagnosis not present

## 2019-03-24 DIAGNOSIS — Z993 Dependence on wheelchair: Secondary | ICD-10-CM | POA: Diagnosis not present

## 2019-03-24 DIAGNOSIS — K5904 Chronic idiopathic constipation: Secondary | ICD-10-CM | POA: Diagnosis not present

## 2019-03-25 DIAGNOSIS — S90822D Blister (nonthermal), left foot, subsequent encounter: Secondary | ICD-10-CM | POA: Diagnosis not present

## 2019-03-25 DIAGNOSIS — G8252 Quadriplegia, C1-C4 incomplete: Secondary | ICD-10-CM | POA: Diagnosis not present

## 2019-03-25 DIAGNOSIS — Z792 Long term (current) use of antibiotics: Secondary | ICD-10-CM | POA: Diagnosis not present

## 2019-03-25 DIAGNOSIS — S90821D Blister (nonthermal), right foot, subsequent encounter: Secondary | ICD-10-CM | POA: Diagnosis not present

## 2019-03-25 DIAGNOSIS — Z79899 Other long term (current) drug therapy: Secondary | ICD-10-CM | POA: Diagnosis not present

## 2019-03-25 DIAGNOSIS — L89322 Pressure ulcer of left buttock, stage 2: Secondary | ICD-10-CM | POA: Diagnosis not present

## 2019-03-25 DIAGNOSIS — Z993 Dependence on wheelchair: Secondary | ICD-10-CM | POA: Diagnosis not present

## 2019-03-25 DIAGNOSIS — K5904 Chronic idiopathic constipation: Secondary | ICD-10-CM | POA: Diagnosis not present

## 2019-03-25 DIAGNOSIS — Z466 Encounter for fitting and adjustment of urinary device: Secondary | ICD-10-CM | POA: Diagnosis not present

## 2019-03-25 DIAGNOSIS — Z9181 History of falling: Secondary | ICD-10-CM | POA: Diagnosis not present

## 2019-03-25 DIAGNOSIS — S14102S Unspecified injury at C2 level of cervical spinal cord, sequela: Secondary | ICD-10-CM | POA: Diagnosis not present

## 2019-03-29 DIAGNOSIS — R1310 Dysphagia, unspecified: Secondary | ICD-10-CM | POA: Diagnosis not present

## 2019-03-30 DIAGNOSIS — D510 Vitamin B12 deficiency anemia due to intrinsic factor deficiency: Secondary | ICD-10-CM | POA: Diagnosis not present

## 2019-03-30 DIAGNOSIS — G8252 Quadriplegia, C1-C4 incomplete: Secondary | ICD-10-CM | POA: Diagnosis not present

## 2019-03-30 DIAGNOSIS — Z993 Dependence on wheelchair: Secondary | ICD-10-CM | POA: Diagnosis not present

## 2019-03-30 DIAGNOSIS — E039 Hypothyroidism, unspecified: Secondary | ICD-10-CM | POA: Diagnosis not present

## 2019-03-30 DIAGNOSIS — S14102S Unspecified injury at C2 level of cervical spinal cord, sequela: Secondary | ICD-10-CM | POA: Diagnosis not present

## 2019-03-30 DIAGNOSIS — Z9181 History of falling: Secondary | ICD-10-CM | POA: Diagnosis not present

## 2019-03-30 DIAGNOSIS — Z79899 Other long term (current) drug therapy: Secondary | ICD-10-CM | POA: Diagnosis not present

## 2019-03-30 DIAGNOSIS — M545 Low back pain: Secondary | ICD-10-CM | POA: Diagnosis not present

## 2019-03-30 DIAGNOSIS — Z466 Encounter for fitting and adjustment of urinary device: Secondary | ICD-10-CM | POA: Diagnosis not present

## 2019-03-30 DIAGNOSIS — K5904 Chronic idiopathic constipation: Secondary | ICD-10-CM | POA: Diagnosis not present

## 2019-03-30 DIAGNOSIS — I10 Essential (primary) hypertension: Secondary | ICD-10-CM | POA: Diagnosis not present

## 2019-04-01 DIAGNOSIS — N319 Neuromuscular dysfunction of bladder, unspecified: Secondary | ICD-10-CM | POA: Diagnosis not present

## 2019-04-06 DIAGNOSIS — S14102S Unspecified injury at C2 level of cervical spinal cord, sequela: Secondary | ICD-10-CM | POA: Diagnosis not present

## 2019-04-06 DIAGNOSIS — E039 Hypothyroidism, unspecified: Secondary | ICD-10-CM | POA: Diagnosis not present

## 2019-04-06 DIAGNOSIS — Z993 Dependence on wheelchair: Secondary | ICD-10-CM | POA: Diagnosis not present

## 2019-04-06 DIAGNOSIS — M1990 Unspecified osteoarthritis, unspecified site: Secondary | ICD-10-CM | POA: Diagnosis not present

## 2019-04-06 DIAGNOSIS — Z466 Encounter for fitting and adjustment of urinary device: Secondary | ICD-10-CM | POA: Diagnosis not present

## 2019-04-06 DIAGNOSIS — G8252 Quadriplegia, C1-C4 incomplete: Secondary | ICD-10-CM | POA: Diagnosis not present

## 2019-04-06 DIAGNOSIS — I10 Essential (primary) hypertension: Secondary | ICD-10-CM | POA: Diagnosis not present

## 2019-04-06 DIAGNOSIS — M545 Low back pain: Secondary | ICD-10-CM | POA: Diagnosis not present

## 2019-04-06 DIAGNOSIS — G250 Essential tremor: Secondary | ICD-10-CM | POA: Diagnosis not present

## 2019-04-06 DIAGNOSIS — I482 Chronic atrial fibrillation, unspecified: Secondary | ICD-10-CM | POA: Diagnosis not present

## 2019-04-06 DIAGNOSIS — D510 Vitamin B12 deficiency anemia due to intrinsic factor deficiency: Secondary | ICD-10-CM | POA: Diagnosis not present

## 2019-04-06 DIAGNOSIS — S2231XD Fracture of one rib, right side, subsequent encounter for fracture with routine healing: Secondary | ICD-10-CM | POA: Diagnosis not present

## 2019-04-06 DIAGNOSIS — I110 Hypertensive heart disease with heart failure: Secondary | ICD-10-CM | POA: Diagnosis not present

## 2019-04-06 DIAGNOSIS — K5904 Chronic idiopathic constipation: Secondary | ICD-10-CM | POA: Diagnosis not present

## 2019-04-06 DIAGNOSIS — I5022 Chronic systolic (congestive) heart failure: Secondary | ICD-10-CM | POA: Diagnosis not present

## 2019-04-06 DIAGNOSIS — Z9181 History of falling: Secondary | ICD-10-CM | POA: Diagnosis not present

## 2019-04-06 DIAGNOSIS — Z79899 Other long term (current) drug therapy: Secondary | ICD-10-CM | POA: Diagnosis not present

## 2019-04-19 DIAGNOSIS — L89322 Pressure ulcer of left buttock, stage 2: Secondary | ICD-10-CM | POA: Diagnosis not present

## 2019-04-19 DIAGNOSIS — S90822D Blister (nonthermal), left foot, subsequent encounter: Secondary | ICD-10-CM | POA: Diagnosis not present

## 2019-04-19 DIAGNOSIS — S90821D Blister (nonthermal), right foot, subsequent encounter: Secondary | ICD-10-CM | POA: Diagnosis not present

## 2019-04-20 DIAGNOSIS — S14102S Unspecified injury at C2 level of cervical spinal cord, sequela: Secondary | ICD-10-CM | POA: Diagnosis not present

## 2019-04-20 DIAGNOSIS — K5904 Chronic idiopathic constipation: Secondary | ICD-10-CM | POA: Diagnosis not present

## 2019-04-20 DIAGNOSIS — G8252 Quadriplegia, C1-C4 incomplete: Secondary | ICD-10-CM | POA: Diagnosis not present

## 2019-04-20 DIAGNOSIS — I10 Essential (primary) hypertension: Secondary | ICD-10-CM | POA: Diagnosis not present

## 2019-04-20 DIAGNOSIS — Z9181 History of falling: Secondary | ICD-10-CM | POA: Diagnosis not present

## 2019-04-20 DIAGNOSIS — D510 Vitamin B12 deficiency anemia due to intrinsic factor deficiency: Secondary | ICD-10-CM | POA: Diagnosis not present

## 2019-04-20 DIAGNOSIS — E039 Hypothyroidism, unspecified: Secondary | ICD-10-CM | POA: Diagnosis not present

## 2019-04-20 DIAGNOSIS — Z466 Encounter for fitting and adjustment of urinary device: Secondary | ICD-10-CM | POA: Diagnosis not present

## 2019-04-20 DIAGNOSIS — Z993 Dependence on wheelchair: Secondary | ICD-10-CM | POA: Diagnosis not present

## 2019-04-20 DIAGNOSIS — Z79899 Other long term (current) drug therapy: Secondary | ICD-10-CM | POA: Diagnosis not present

## 2019-04-20 DIAGNOSIS — M545 Low back pain: Secondary | ICD-10-CM | POA: Diagnosis not present

## 2019-04-21 DIAGNOSIS — Z79899 Other long term (current) drug therapy: Secondary | ICD-10-CM | POA: Diagnosis not present

## 2019-04-21 DIAGNOSIS — D510 Vitamin B12 deficiency anemia due to intrinsic factor deficiency: Secondary | ICD-10-CM | POA: Diagnosis not present

## 2019-04-21 DIAGNOSIS — K5904 Chronic idiopathic constipation: Secondary | ICD-10-CM | POA: Diagnosis not present

## 2019-04-21 DIAGNOSIS — M545 Low back pain: Secondary | ICD-10-CM | POA: Diagnosis not present

## 2019-04-21 DIAGNOSIS — E039 Hypothyroidism, unspecified: Secondary | ICD-10-CM | POA: Diagnosis not present

## 2019-04-21 DIAGNOSIS — I10 Essential (primary) hypertension: Secondary | ICD-10-CM | POA: Diagnosis not present

## 2019-04-21 DIAGNOSIS — Z466 Encounter for fitting and adjustment of urinary device: Secondary | ICD-10-CM | POA: Diagnosis not present

## 2019-04-21 DIAGNOSIS — Z9181 History of falling: Secondary | ICD-10-CM | POA: Diagnosis not present

## 2019-04-21 DIAGNOSIS — Z993 Dependence on wheelchair: Secondary | ICD-10-CM | POA: Diagnosis not present

## 2019-04-21 DIAGNOSIS — G8252 Quadriplegia, C1-C4 incomplete: Secondary | ICD-10-CM | POA: Diagnosis not present

## 2019-04-21 DIAGNOSIS — S14102S Unspecified injury at C2 level of cervical spinal cord, sequela: Secondary | ICD-10-CM | POA: Diagnosis not present

## 2019-04-21 DIAGNOSIS — N319 Neuromuscular dysfunction of bladder, unspecified: Secondary | ICD-10-CM | POA: Diagnosis not present

## 2019-04-28 DIAGNOSIS — Z9181 History of falling: Secondary | ICD-10-CM | POA: Diagnosis not present

## 2019-04-28 DIAGNOSIS — Z993 Dependence on wheelchair: Secondary | ICD-10-CM | POA: Diagnosis not present

## 2019-04-28 DIAGNOSIS — M545 Low back pain: Secondary | ICD-10-CM | POA: Diagnosis not present

## 2019-04-28 DIAGNOSIS — E039 Hypothyroidism, unspecified: Secondary | ICD-10-CM | POA: Diagnosis not present

## 2019-04-28 DIAGNOSIS — G8252 Quadriplegia, C1-C4 incomplete: Secondary | ICD-10-CM | POA: Diagnosis not present

## 2019-04-28 DIAGNOSIS — I10 Essential (primary) hypertension: Secondary | ICD-10-CM | POA: Diagnosis not present

## 2019-04-28 DIAGNOSIS — Z466 Encounter for fitting and adjustment of urinary device: Secondary | ICD-10-CM | POA: Diagnosis not present

## 2019-04-28 DIAGNOSIS — Z79899 Other long term (current) drug therapy: Secondary | ICD-10-CM | POA: Diagnosis not present

## 2019-04-28 DIAGNOSIS — D510 Vitamin B12 deficiency anemia due to intrinsic factor deficiency: Secondary | ICD-10-CM | POA: Diagnosis not present

## 2019-04-28 DIAGNOSIS — K5904 Chronic idiopathic constipation: Secondary | ICD-10-CM | POA: Diagnosis not present

## 2019-04-28 DIAGNOSIS — S14102S Unspecified injury at C2 level of cervical spinal cord, sequela: Secondary | ICD-10-CM | POA: Diagnosis not present

## 2019-05-03 DIAGNOSIS — J329 Chronic sinusitis, unspecified: Secondary | ICD-10-CM | POA: Diagnosis not present

## 2019-05-03 DIAGNOSIS — H9313 Tinnitus, bilateral: Secondary | ICD-10-CM | POA: Diagnosis not present

## 2019-05-03 DIAGNOSIS — M79672 Pain in left foot: Secondary | ICD-10-CM | POA: Diagnosis not present

## 2019-05-03 DIAGNOSIS — H90A21 Sensorineural hearing loss, unilateral, right ear, with restricted hearing on the contralateral side: Secondary | ICD-10-CM | POA: Diagnosis not present

## 2019-05-03 DIAGNOSIS — S00411D Abrasion of right ear, subsequent encounter: Secondary | ICD-10-CM | POA: Diagnosis not present

## 2019-05-03 DIAGNOSIS — H90A12 Conductive hearing loss, unilateral, left ear with restricted hearing on the contralateral side: Secondary | ICD-10-CM | POA: Diagnosis not present

## 2019-05-03 DIAGNOSIS — S9032XA Contusion of left foot, initial encounter: Secondary | ICD-10-CM | POA: Diagnosis not present

## 2019-05-04 DIAGNOSIS — S9032XA Contusion of left foot, initial encounter: Secondary | ICD-10-CM | POA: Diagnosis not present

## 2019-05-14 DIAGNOSIS — S14102S Unspecified injury at C2 level of cervical spinal cord, sequela: Secondary | ICD-10-CM | POA: Diagnosis not present

## 2019-05-14 DIAGNOSIS — Z466 Encounter for fitting and adjustment of urinary device: Secondary | ICD-10-CM | POA: Diagnosis not present

## 2019-05-14 DIAGNOSIS — Z993 Dependence on wheelchair: Secondary | ICD-10-CM | POA: Diagnosis not present

## 2019-05-14 DIAGNOSIS — Z79899 Other long term (current) drug therapy: Secondary | ICD-10-CM | POA: Diagnosis not present

## 2019-05-14 DIAGNOSIS — Z9181 History of falling: Secondary | ICD-10-CM | POA: Diagnosis not present

## 2019-05-14 DIAGNOSIS — I10 Essential (primary) hypertension: Secondary | ICD-10-CM | POA: Diagnosis not present

## 2019-05-14 DIAGNOSIS — M545 Low back pain: Secondary | ICD-10-CM | POA: Diagnosis not present

## 2019-05-14 DIAGNOSIS — E039 Hypothyroidism, unspecified: Secondary | ICD-10-CM | POA: Diagnosis not present

## 2019-05-14 DIAGNOSIS — D510 Vitamin B12 deficiency anemia due to intrinsic factor deficiency: Secondary | ICD-10-CM | POA: Diagnosis not present

## 2019-05-14 DIAGNOSIS — K5904 Chronic idiopathic constipation: Secondary | ICD-10-CM | POA: Diagnosis not present

## 2019-05-14 DIAGNOSIS — G8252 Quadriplegia, C1-C4 incomplete: Secondary | ICD-10-CM | POA: Diagnosis not present

## 2019-05-16 DIAGNOSIS — Z20828 Contact with and (suspected) exposure to other viral communicable diseases: Secondary | ICD-10-CM | POA: Diagnosis not present

## 2019-05-16 DIAGNOSIS — Z03818 Encounter for observation for suspected exposure to other biological agents ruled out: Secondary | ICD-10-CM | POA: Diagnosis not present

## 2019-05-16 DIAGNOSIS — Z20822 Contact with and (suspected) exposure to covid-19: Secondary | ICD-10-CM | POA: Diagnosis not present

## 2019-05-19 DIAGNOSIS — Z993 Dependence on wheelchair: Secondary | ICD-10-CM | POA: Diagnosis not present

## 2019-05-19 DIAGNOSIS — K5904 Chronic idiopathic constipation: Secondary | ICD-10-CM | POA: Diagnosis not present

## 2019-05-19 DIAGNOSIS — I10 Essential (primary) hypertension: Secondary | ICD-10-CM | POA: Diagnosis not present

## 2019-05-19 DIAGNOSIS — G8252 Quadriplegia, C1-C4 incomplete: Secondary | ICD-10-CM | POA: Diagnosis not present

## 2019-05-19 DIAGNOSIS — D510 Vitamin B12 deficiency anemia due to intrinsic factor deficiency: Secondary | ICD-10-CM | POA: Diagnosis not present

## 2019-05-19 DIAGNOSIS — M545 Low back pain: Secondary | ICD-10-CM | POA: Diagnosis not present

## 2019-05-19 DIAGNOSIS — E039 Hypothyroidism, unspecified: Secondary | ICD-10-CM | POA: Diagnosis not present

## 2019-05-19 DIAGNOSIS — S14102S Unspecified injury at C2 level of cervical spinal cord, sequela: Secondary | ICD-10-CM | POA: Diagnosis not present

## 2019-05-19 DIAGNOSIS — Z9181 History of falling: Secondary | ICD-10-CM | POA: Diagnosis not present

## 2019-05-19 DIAGNOSIS — Z466 Encounter for fitting and adjustment of urinary device: Secondary | ICD-10-CM | POA: Diagnosis not present

## 2019-05-19 DIAGNOSIS — Z79899 Other long term (current) drug therapy: Secondary | ICD-10-CM | POA: Diagnosis not present

## 2019-05-21 DIAGNOSIS — K224 Dyskinesia of esophagus: Secondary | ICD-10-CM | POA: Diagnosis not present

## 2019-05-24 DIAGNOSIS — Z79899 Other long term (current) drug therapy: Secondary | ICD-10-CM | POA: Diagnosis not present

## 2019-05-24 DIAGNOSIS — Z881 Allergy status to other antibiotic agents status: Secondary | ICD-10-CM | POA: Diagnosis not present

## 2019-05-24 DIAGNOSIS — Z950 Presence of cardiac pacemaker: Secondary | ICD-10-CM | POA: Diagnosis not present

## 2019-05-24 DIAGNOSIS — R39198 Other difficulties with micturition: Secondary | ICD-10-CM | POA: Diagnosis not present

## 2019-05-24 DIAGNOSIS — M1991 Primary osteoarthritis, unspecified site: Secondary | ICD-10-CM | POA: Diagnosis not present

## 2019-05-24 DIAGNOSIS — T83091A Other mechanical complication of indwelling urethral catheter, initial encounter: Secondary | ICD-10-CM | POA: Diagnosis not present

## 2019-05-24 DIAGNOSIS — E119 Type 2 diabetes mellitus without complications: Secondary | ICD-10-CM | POA: Diagnosis not present

## 2019-05-24 DIAGNOSIS — G8250 Quadriplegia, unspecified: Secondary | ICD-10-CM | POA: Diagnosis not present

## 2019-05-25 DIAGNOSIS — S14102S Unspecified injury at C2 level of cervical spinal cord, sequela: Secondary | ICD-10-CM | POA: Diagnosis not present

## 2019-05-25 DIAGNOSIS — Z993 Dependence on wheelchair: Secondary | ICD-10-CM | POA: Diagnosis not present

## 2019-05-25 DIAGNOSIS — Z9181 History of falling: Secondary | ICD-10-CM | POA: Diagnosis not present

## 2019-05-25 DIAGNOSIS — G8252 Quadriplegia, C1-C4 incomplete: Secondary | ICD-10-CM | POA: Diagnosis not present

## 2019-05-25 DIAGNOSIS — Z466 Encounter for fitting and adjustment of urinary device: Secondary | ICD-10-CM | POA: Diagnosis not present

## 2019-05-25 DIAGNOSIS — E039 Hypothyroidism, unspecified: Secondary | ICD-10-CM | POA: Diagnosis not present

## 2019-05-25 DIAGNOSIS — K5904 Chronic idiopathic constipation: Secondary | ICD-10-CM | POA: Diagnosis not present

## 2019-05-25 DIAGNOSIS — M545 Low back pain: Secondary | ICD-10-CM | POA: Diagnosis not present

## 2019-05-25 DIAGNOSIS — D510 Vitamin B12 deficiency anemia due to intrinsic factor deficiency: Secondary | ICD-10-CM | POA: Diagnosis not present

## 2019-05-25 DIAGNOSIS — Z79899 Other long term (current) drug therapy: Secondary | ICD-10-CM | POA: Diagnosis not present

## 2019-05-25 DIAGNOSIS — I10 Essential (primary) hypertension: Secondary | ICD-10-CM | POA: Diagnosis not present

## 2019-05-26 ENCOUNTER — Encounter: Payer: Self-pay | Admitting: Neurology

## 2019-05-26 ENCOUNTER — Ambulatory Visit (INDEPENDENT_AMBULATORY_CARE_PROVIDER_SITE_OTHER): Payer: PPO | Admitting: Neurology

## 2019-05-26 ENCOUNTER — Other Ambulatory Visit: Payer: Self-pay

## 2019-05-26 VITALS — BP 95/72 | HR 86 | Temp 97.2°F

## 2019-05-26 DIAGNOSIS — I10 Essential (primary) hypertension: Secondary | ICD-10-CM | POA: Diagnosis not present

## 2019-05-26 DIAGNOSIS — K5904 Chronic idiopathic constipation: Secondary | ICD-10-CM | POA: Diagnosis not present

## 2019-05-26 DIAGNOSIS — Z79899 Other long term (current) drug therapy: Secondary | ICD-10-CM | POA: Diagnosis not present

## 2019-05-26 DIAGNOSIS — Z993 Dependence on wheelchair: Secondary | ICD-10-CM | POA: Diagnosis not present

## 2019-05-26 DIAGNOSIS — S14102S Unspecified injury at C2 level of cervical spinal cord, sequela: Secondary | ICD-10-CM | POA: Diagnosis not present

## 2019-05-26 DIAGNOSIS — G8250 Quadriplegia, unspecified: Secondary | ICD-10-CM | POA: Diagnosis not present

## 2019-05-26 DIAGNOSIS — G8252 Quadriplegia, C1-C4 incomplete: Secondary | ICD-10-CM | POA: Diagnosis not present

## 2019-05-26 DIAGNOSIS — Z466 Encounter for fitting and adjustment of urinary device: Secondary | ICD-10-CM | POA: Diagnosis not present

## 2019-05-26 DIAGNOSIS — M545 Low back pain: Secondary | ICD-10-CM | POA: Diagnosis not present

## 2019-05-26 DIAGNOSIS — Z9181 History of falling: Secondary | ICD-10-CM | POA: Diagnosis not present

## 2019-05-26 DIAGNOSIS — D510 Vitamin B12 deficiency anemia due to intrinsic factor deficiency: Secondary | ICD-10-CM | POA: Diagnosis not present

## 2019-05-26 DIAGNOSIS — E039 Hypothyroidism, unspecified: Secondary | ICD-10-CM | POA: Diagnosis not present

## 2019-05-26 MED ORDER — BACLOFEN 20 MG PO TABS: 20.0000 mg | ORAL_TABLET | Freq: Four times a day (QID) | ORAL | 11 refills | Status: DC
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
## 2019-05-28 DIAGNOSIS — G8929 Other chronic pain: Secondary | ICD-10-CM | POA: Insufficient documentation

## 2019-05-28 DIAGNOSIS — E538 Deficiency of other specified B group vitamins: Secondary | ICD-10-CM | POA: Diagnosis not present

## 2019-05-28 DIAGNOSIS — G8250 Quadriplegia, unspecified: Secondary | ICD-10-CM | POA: Diagnosis not present

## 2019-05-28 DIAGNOSIS — Z993 Dependence on wheelchair: Secondary | ICD-10-CM | POA: Diagnosis not present

## 2019-05-28 DIAGNOSIS — G35 Multiple sclerosis: Secondary | ICD-10-CM | POA: Diagnosis not present

## 2019-05-28 DIAGNOSIS — S14109S Unspecified injury at unspecified level of cervical spinal cord, sequela: Secondary | ICD-10-CM | POA: Diagnosis not present

## 2019-05-30 DIAGNOSIS — Z7952 Long term (current) use of systemic steroids: Secondary | ICD-10-CM | POA: Diagnosis not present

## 2019-05-30 DIAGNOSIS — Z993 Dependence on wheelchair: Secondary | ICD-10-CM | POA: Diagnosis not present

## 2019-05-30 DIAGNOSIS — S14102S Unspecified injury at C2 level of cervical spinal cord, sequela: Secondary | ICD-10-CM | POA: Diagnosis not present

## 2019-05-30 DIAGNOSIS — Z9181 History of falling: Secondary | ICD-10-CM | POA: Diagnosis not present

## 2019-05-30 DIAGNOSIS — Z79899 Other long term (current) drug therapy: Secondary | ICD-10-CM | POA: Diagnosis not present

## 2019-05-30 DIAGNOSIS — M545 Low back pain: Secondary | ICD-10-CM | POA: Diagnosis not present

## 2019-05-30 DIAGNOSIS — I10 Essential (primary) hypertension: Secondary | ICD-10-CM | POA: Diagnosis not present

## 2019-05-30 DIAGNOSIS — G8252 Quadriplegia, C1-C4 incomplete: Secondary | ICD-10-CM | POA: Diagnosis not present

## 2019-05-30 DIAGNOSIS — K5904 Chronic idiopathic constipation: Secondary | ICD-10-CM | POA: Diagnosis not present

## 2019-05-30 DIAGNOSIS — E039 Hypothyroidism, unspecified: Secondary | ICD-10-CM | POA: Diagnosis not present

## 2019-05-30 DIAGNOSIS — D510 Vitamin B12 deficiency anemia due to intrinsic factor deficiency: Secondary | ICD-10-CM | POA: Diagnosis not present

## 2019-05-30 DIAGNOSIS — Z466 Encounter for fitting and adjustment of urinary device: Secondary | ICD-10-CM | POA: Diagnosis not present

## 2019-05-31 DIAGNOSIS — N319 Neuromuscular dysfunction of bladder, unspecified: Secondary | ICD-10-CM | POA: Diagnosis not present

## 2019-05-31 DIAGNOSIS — G35 Multiple sclerosis: Secondary | ICD-10-CM | POA: Diagnosis not present

## 2019-05-31 DIAGNOSIS — G8250 Quadriplegia, unspecified: Secondary | ICD-10-CM | POA: Diagnosis not present

## 2019-05-31 DIAGNOSIS — S14109S Unspecified injury at unspecified level of cervical spinal cord, sequela: Secondary | ICD-10-CM | POA: Diagnosis not present

## 2019-05-31 DIAGNOSIS — Z01810 Encounter for preprocedural cardiovascular examination: Secondary | ICD-10-CM | POA: Diagnosis not present

## 2019-05-31 DIAGNOSIS — I38 Endocarditis, valve unspecified: Secondary | ICD-10-CM | POA: Diagnosis not present

## 2019-05-31 DIAGNOSIS — Z01818 Encounter for other preprocedural examination: Secondary | ICD-10-CM | POA: Diagnosis not present

## 2019-05-31 DIAGNOSIS — I495 Sick sinus syndrome: Secondary | ICD-10-CM | POA: Diagnosis not present

## 2019-05-31 DIAGNOSIS — E039 Hypothyroidism, unspecified: Secondary | ICD-10-CM | POA: Diagnosis not present

## 2019-06-04 DIAGNOSIS — Z01818 Encounter for other preprocedural examination: Secondary | ICD-10-CM | POA: Diagnosis not present

## 2019-06-07 DIAGNOSIS — G8250 Quadriplegia, unspecified: Secondary | ICD-10-CM | POA: Diagnosis not present

## 2019-06-07 DIAGNOSIS — Z79899 Other long term (current) drug therapy: Secondary | ICD-10-CM | POA: Diagnosis not present

## 2019-06-07 DIAGNOSIS — R339 Retention of urine, unspecified: Secondary | ICD-10-CM | POA: Diagnosis not present

## 2019-06-07 DIAGNOSIS — Z881 Allergy status to other antibiotic agents status: Secondary | ICD-10-CM | POA: Diagnosis not present

## 2019-06-07 DIAGNOSIS — Z950 Presence of cardiac pacemaker: Secondary | ICD-10-CM | POA: Diagnosis not present

## 2019-06-07 DIAGNOSIS — R338 Other retention of urine: Secondary | ICD-10-CM | POA: Diagnosis not present

## 2019-06-07 DIAGNOSIS — Z888 Allergy status to other drugs, medicaments and biological substances status: Secondary | ICD-10-CM | POA: Diagnosis not present

## 2019-06-15 DIAGNOSIS — G8252 Quadriplegia, C1-C4 incomplete: Secondary | ICD-10-CM | POA: Diagnosis not present

## 2019-06-15 DIAGNOSIS — Z7952 Long term (current) use of systemic steroids: Secondary | ICD-10-CM | POA: Diagnosis not present

## 2019-06-15 DIAGNOSIS — Z993 Dependence on wheelchair: Secondary | ICD-10-CM | POA: Diagnosis not present

## 2019-06-15 DIAGNOSIS — D510 Vitamin B12 deficiency anemia due to intrinsic factor deficiency: Secondary | ICD-10-CM | POA: Diagnosis not present

## 2019-06-15 DIAGNOSIS — E039 Hypothyroidism, unspecified: Secondary | ICD-10-CM | POA: Diagnosis not present

## 2019-06-15 DIAGNOSIS — K5904 Chronic idiopathic constipation: Secondary | ICD-10-CM | POA: Diagnosis not present

## 2019-06-15 DIAGNOSIS — Z9181 History of falling: Secondary | ICD-10-CM | POA: Diagnosis not present

## 2019-06-15 DIAGNOSIS — I10 Essential (primary) hypertension: Secondary | ICD-10-CM | POA: Diagnosis not present

## 2019-06-15 DIAGNOSIS — Z466 Encounter for fitting and adjustment of urinary device: Secondary | ICD-10-CM | POA: Diagnosis not present

## 2019-06-15 DIAGNOSIS — Z79899 Other long term (current) drug therapy: Secondary | ICD-10-CM | POA: Diagnosis not present

## 2019-06-15 DIAGNOSIS — M545 Low back pain: Secondary | ICD-10-CM | POA: Diagnosis not present

## 2019-06-15 DIAGNOSIS — S14102S Unspecified injury at C2 level of cervical spinal cord, sequela: Secondary | ICD-10-CM | POA: Diagnosis not present

## 2019-06-18 ENCOUNTER — Telehealth: Payer: Self-pay | Admitting: Neurology

## 2019-06-18 NOTE — Telephone Encounter (Signed)
[name], [name](daughter on [facility]) is asking for a call to discuss the increase on pt's Baclofen

## 2019-06-18 NOTE — Telephone Encounter (Signed)
Rennis Harding, Denise(daughter on [facility]) has called stating she has checked with [employer] and they have not been contacted by anyone re: pt  Starting Botox.  Daughter is asking for a call re: when that will get going

## 2019-06-21 NOTE — Addendum Note (Signed)
Addended by: Lindell Spar C on: 06/21/2019 10:26 AM   Modules accepted: Orders

## 2019-06-21 NOTE — Telephone Encounter (Signed)
I spoke to her daughter who reports the patient has been alternating baclofen 10mg  with 20mg  four times per day. Dr. provided her with a new prescription for baclofen 20mg , one tablet QID. This is the maximum dosage for the medication. Her daughter understands and will start stop the 10mg  tablets and start taking the new prescription, as prescribed. She is aware our office is working on getting her Xeomin injections approved.

## 2019-06-21 NOTE — Telephone Encounter (Signed)
Left message requesting a call back.

## 2019-06-23 NOTE — Telephone Encounter (Signed)
I returned the call to [facility] but she did not answer so I left a VM asking her to call back. When she calls back please ask her if this apt date and time would work.

## 2019-06-23 NOTE — Telephone Encounter (Signed)
G82.50 - Xeomin 600 units to be approved but Dr. Terrace Arabia will start with Xeomin 400 units.

## 2019-06-24 NOTE — Telephone Encounter (Signed)
Noted, thank you

## 2019-06-28 DIAGNOSIS — S14109D Unspecified injury at unspecified level of cervical spinal cord, subsequent encounter: Secondary | ICD-10-CM | POA: Diagnosis not present

## 2019-06-28 DIAGNOSIS — G35 Multiple sclerosis: Secondary | ICD-10-CM | POA: Diagnosis not present

## 2019-06-28 DIAGNOSIS — G8252 Quadriplegia, C1-C4 incomplete: Secondary | ICD-10-CM | POA: Diagnosis not present

## 2019-06-28 DIAGNOSIS — Z7409 Other reduced mobility: Secondary | ICD-10-CM | POA: Diagnosis not present

## 2019-06-30 ENCOUNTER — Telehealth: Payer: Self-pay | Admitting: Neurology

## 2019-06-30 NOTE — Telephone Encounter (Signed)
Pts daughter stating they are unable to [facility] Thursday appt and would like a M, T, or Wed appt please call

## 2019-07-01 ENCOUNTER — Ambulatory Visit: Payer: PPO | Admitting: Neurology

## 2019-07-01 NOTE — Telephone Encounter (Signed)
I called and scheduled the patient for a Wednesday apt. DWD

## 2019-07-13 DIAGNOSIS — N319 Neuromuscular dysfunction of bladder, unspecified: Secondary | ICD-10-CM | POA: Diagnosis not present

## 2019-07-14 DIAGNOSIS — S14109S Unspecified injury at unspecified level of cervical spinal cord, sequela: Secondary | ICD-10-CM | POA: Diagnosis not present

## 2019-07-14 DIAGNOSIS — Z7409 Other reduced mobility: Secondary | ICD-10-CM | POA: Diagnosis not present

## 2019-07-14 DIAGNOSIS — R278 Other lack of coordination: Secondary | ICD-10-CM | POA: Diagnosis not present

## 2019-07-14 DIAGNOSIS — Z993 Dependence on wheelchair: Secondary | ICD-10-CM | POA: Diagnosis not present

## 2019-07-14 DIAGNOSIS — G35 Multiple sclerosis: Secondary | ICD-10-CM | POA: Diagnosis not present

## 2019-07-14 DIAGNOSIS — G8250 Quadriplegia, unspecified: Secondary | ICD-10-CM | POA: Diagnosis not present

## 2019-07-21 ENCOUNTER — Telehealth: Payer: Self-pay | Admitting: *Deleted

## 2019-07-21 NOTE — Telephone Encounter (Signed)
I called [employer] and spoke to [place].  She states [id number], [phone] are billable and do not require PA.  Will be B/B. Ref# for call is [id number]

## 2019-07-24 DIAGNOSIS — T83090A Other mechanical complication of cystostomy catheter, initial encounter: Secondary | ICD-10-CM | POA: Diagnosis not present

## 2019-07-24 DIAGNOSIS — T83038A Leakage of other indwelling urethral catheter, initial encounter: Secondary | ICD-10-CM | POA: Diagnosis not present

## 2019-07-26 DIAGNOSIS — S14102S Unspecified injury at C2 level of cervical spinal cord, sequela: Secondary | ICD-10-CM | POA: Diagnosis not present

## 2019-07-26 DIAGNOSIS — D510 Vitamin B12 deficiency anemia due to intrinsic factor deficiency: Secondary | ICD-10-CM | POA: Diagnosis not present

## 2019-07-26 DIAGNOSIS — Z9181 History of falling: Secondary | ICD-10-CM | POA: Diagnosis not present

## 2019-07-26 DIAGNOSIS — G8252 Quadriplegia, C1-C4 incomplete: Secondary | ICD-10-CM | POA: Diagnosis not present

## 2019-07-26 DIAGNOSIS — M545 Low back pain: Secondary | ICD-10-CM | POA: Diagnosis not present

## 2019-07-26 DIAGNOSIS — E039 Hypothyroidism, unspecified: Secondary | ICD-10-CM | POA: Diagnosis not present

## 2019-07-26 DIAGNOSIS — Z7401 Bed confinement status: Secondary | ICD-10-CM | POA: Diagnosis not present

## 2019-07-26 DIAGNOSIS — K5904 Chronic idiopathic constipation: Secondary | ICD-10-CM | POA: Diagnosis not present

## 2019-07-26 DIAGNOSIS — Z7952 Long term (current) use of systemic steroids: Secondary | ICD-10-CM | POA: Diagnosis not present

## 2019-07-26 DIAGNOSIS — I10 Essential (primary) hypertension: Secondary | ICD-10-CM | POA: Diagnosis not present

## 2019-07-26 DIAGNOSIS — Z79899 Other long term (current) drug therapy: Secondary | ICD-10-CM | POA: Diagnosis not present

## 2019-07-26 DIAGNOSIS — Z466 Encounter for fitting and adjustment of urinary device: Secondary | ICD-10-CM | POA: Diagnosis not present

## 2019-07-27 DIAGNOSIS — G8250 Quadriplegia, unspecified: Secondary | ICD-10-CM | POA: Diagnosis not present

## 2019-07-27 DIAGNOSIS — S14109S Unspecified injury at unspecified level of cervical spinal cord, sequela: Secondary | ICD-10-CM | POA: Diagnosis not present

## 2019-07-27 DIAGNOSIS — G35 Multiple sclerosis: Secondary | ICD-10-CM | POA: Diagnosis not present

## 2019-07-27 DIAGNOSIS — Z993 Dependence on wheelchair: Secondary | ICD-10-CM | POA: Diagnosis not present

## 2019-07-27 DIAGNOSIS — Z7409 Other reduced mobility: Secondary | ICD-10-CM | POA: Diagnosis not present

## 2019-07-27 DIAGNOSIS — R279 Unspecified lack of coordination: Secondary | ICD-10-CM | POA: Diagnosis not present

## 2019-07-28 ENCOUNTER — Encounter: Payer: Self-pay | Admitting: Neurology

## 2019-07-28 ENCOUNTER — Ambulatory Visit: Payer: PPO | Admitting: Neurology

## 2019-07-28 ENCOUNTER — Other Ambulatory Visit: Payer: Self-pay

## 2019-07-28 VITALS — BP 108/68 | HR 68 | Temp 97.1°F

## 2019-07-28 DIAGNOSIS — G8250 Quadriplegia, unspecified: Secondary | ICD-10-CM | POA: Diagnosis not present

## 2019-07-28 MED ORDER — INCOBOTULINUMTOXINA 100 UNITS IM SOLR
400.0000 [IU] | INTRAMUSCULAR | Status: DC
  Administered 2019-07-28: 17:00:00 400 [IU] via INTRAMUSCULAR
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
## 2019-07-28 NOTE — Progress Notes (Signed)
**  Xeomin 200 units x 2 vials, NDC 4818-5909-31, Lot [id number], Exp 05/2021, office supply.1st injection.//mck,rn**

## 2019-08-10 DIAGNOSIS — G35 Multiple sclerosis: Secondary | ICD-10-CM | POA: Diagnosis not present

## 2019-08-10 DIAGNOSIS — Z993 Dependence on wheelchair: Secondary | ICD-10-CM | POA: Diagnosis not present

## 2019-08-10 DIAGNOSIS — Z7409 Other reduced mobility: Secondary | ICD-10-CM | POA: Diagnosis not present

## 2019-08-10 DIAGNOSIS — S14109S Unspecified injury at unspecified level of cervical spinal cord, sequela: Secondary | ICD-10-CM | POA: Diagnosis not present

## 2019-08-10 DIAGNOSIS — G8250 Quadriplegia, unspecified: Secondary | ICD-10-CM | POA: Diagnosis not present

## 2019-08-16 DIAGNOSIS — Z435 Encounter for attention to cystostomy: Secondary | ICD-10-CM | POA: Diagnosis not present

## 2019-08-23 DIAGNOSIS — R278 Other lack of coordination: Secondary | ICD-10-CM | POA: Diagnosis not present

## 2019-08-23 DIAGNOSIS — G35 Multiple sclerosis: Secondary | ICD-10-CM | POA: Diagnosis not present

## 2019-08-23 DIAGNOSIS — G8250 Quadriplegia, unspecified: Secondary | ICD-10-CM | POA: Diagnosis not present

## 2019-08-23 DIAGNOSIS — Z7409 Other reduced mobility: Secondary | ICD-10-CM | POA: Diagnosis not present

## 2019-08-23 DIAGNOSIS — Z789 Other specified health status: Secondary | ICD-10-CM | POA: Diagnosis not present

## 2019-08-23 DIAGNOSIS — R279 Unspecified lack of coordination: Secondary | ICD-10-CM | POA: Diagnosis not present

## 2019-08-23 DIAGNOSIS — S14109S Unspecified injury at unspecified level of cervical spinal cord, sequela: Secondary | ICD-10-CM | POA: Diagnosis not present

## 2019-08-25 DIAGNOSIS — N3945 Continuous leakage: Secondary | ICD-10-CM | POA: Diagnosis not present

## 2019-08-27 DIAGNOSIS — R0981 Nasal congestion: Secondary | ICD-10-CM | POA: Diagnosis not present

## 2019-08-27 DIAGNOSIS — T83511S Infection and inflammatory reaction due to indwelling urethral catheter, sequela: Secondary | ICD-10-CM | POA: Diagnosis not present

## 2019-08-27 DIAGNOSIS — N390 Urinary tract infection, site not specified: Secondary | ICD-10-CM | POA: Diagnosis not present

## 2019-08-30 DIAGNOSIS — K5904 Chronic idiopathic constipation: Secondary | ICD-10-CM | POA: Diagnosis not present

## 2019-08-30 DIAGNOSIS — G8252 Quadriplegia, C1-C4 incomplete: Secondary | ICD-10-CM | POA: Diagnosis not present

## 2019-08-30 DIAGNOSIS — Z466 Encounter for fitting and adjustment of urinary device: Secondary | ICD-10-CM | POA: Diagnosis not present

## 2019-08-30 DIAGNOSIS — E039 Hypothyroidism, unspecified: Secondary | ICD-10-CM | POA: Diagnosis not present

## 2019-08-30 DIAGNOSIS — Z7401 Bed confinement status: Secondary | ICD-10-CM | POA: Diagnosis not present

## 2019-08-30 DIAGNOSIS — Z7952 Long term (current) use of systemic steroids: Secondary | ICD-10-CM | POA: Diagnosis not present

## 2019-08-30 DIAGNOSIS — S14102S Unspecified injury at C2 level of cervical spinal cord, sequela: Secondary | ICD-10-CM | POA: Diagnosis not present

## 2019-08-30 DIAGNOSIS — M545 Low back pain: Secondary | ICD-10-CM | POA: Diagnosis not present

## 2019-08-30 DIAGNOSIS — D510 Vitamin B12 deficiency anemia due to intrinsic factor deficiency: Secondary | ICD-10-CM | POA: Diagnosis not present

## 2019-08-30 DIAGNOSIS — I10 Essential (primary) hypertension: Secondary | ICD-10-CM | POA: Diagnosis not present

## 2019-08-30 DIAGNOSIS — Z79899 Other long term (current) drug therapy: Secondary | ICD-10-CM | POA: Diagnosis not present

## 2019-08-30 DIAGNOSIS — Z9181 History of falling: Secondary | ICD-10-CM | POA: Diagnosis not present

## 2019-09-06 DIAGNOSIS — N319 Neuromuscular dysfunction of bladder, unspecified: Secondary | ICD-10-CM | POA: Diagnosis not present

## 2019-09-06 DIAGNOSIS — G8250 Quadriplegia, unspecified: Secondary | ICD-10-CM | POA: Diagnosis not present

## 2019-09-06 DIAGNOSIS — S14109S Unspecified injury at unspecified level of cervical spinal cord, sequela: Secondary | ICD-10-CM | POA: Diagnosis not present

## 2019-09-08 DIAGNOSIS — G8250 Quadriplegia, unspecified: Secondary | ICD-10-CM | POA: Diagnosis not present

## 2019-09-08 DIAGNOSIS — Z881 Allergy status to other antibiotic agents status: Secondary | ICD-10-CM | POA: Diagnosis not present

## 2019-09-08 DIAGNOSIS — Z79899 Other long term (current) drug therapy: Secondary | ICD-10-CM | POA: Diagnosis not present

## 2019-09-08 DIAGNOSIS — Z950 Presence of cardiac pacemaker: Secondary | ICD-10-CM | POA: Diagnosis not present

## 2019-09-08 DIAGNOSIS — Z888 Allergy status to other drugs, medicaments and biological substances status: Secondary | ICD-10-CM | POA: Diagnosis not present

## 2019-09-08 DIAGNOSIS — T83038A Leakage of other indwelling urethral catheter, initial encounter: Secondary | ICD-10-CM | POA: Diagnosis not present

## 2019-09-08 DIAGNOSIS — T83098A Other mechanical complication of other indwelling urethral catheter, initial encounter: Secondary | ICD-10-CM | POA: Diagnosis not present

## 2019-09-09 DIAGNOSIS — Z9359 Other cystostomy status: Secondary | ICD-10-CM | POA: Diagnosis not present

## 2019-09-15 DIAGNOSIS — Z9359 Other cystostomy status: Secondary | ICD-10-CM | POA: Diagnosis not present

## 2019-09-15 DIAGNOSIS — N319 Neuromuscular dysfunction of bladder, unspecified: Secondary | ICD-10-CM | POA: Diagnosis not present

## 2019-09-21 DIAGNOSIS — N319 Neuromuscular dysfunction of bladder, unspecified: Secondary | ICD-10-CM | POA: Insufficient documentation

## 2019-09-26 DIAGNOSIS — Z888 Allergy status to other drugs, medicaments and biological substances status: Secondary | ICD-10-CM | POA: Diagnosis not present

## 2019-09-26 DIAGNOSIS — T83098A Other mechanical complication of other indwelling urethral catheter, initial encounter: Secondary | ICD-10-CM | POA: Diagnosis not present

## 2019-09-26 DIAGNOSIS — Z7901 Long term (current) use of anticoagulants: Secondary | ICD-10-CM | POA: Diagnosis not present

## 2019-09-26 DIAGNOSIS — Z79899 Other long term (current) drug therapy: Secondary | ICD-10-CM | POA: Diagnosis not present

## 2019-09-27 DIAGNOSIS — I10 Essential (primary) hypertension: Secondary | ICD-10-CM | POA: Diagnosis not present

## 2019-09-27 DIAGNOSIS — Z79899 Other long term (current) drug therapy: Secondary | ICD-10-CM | POA: Diagnosis not present

## 2019-09-27 DIAGNOSIS — Z7401 Bed confinement status: Secondary | ICD-10-CM | POA: Diagnosis not present

## 2019-09-27 DIAGNOSIS — E039 Hypothyroidism, unspecified: Secondary | ICD-10-CM | POA: Diagnosis not present

## 2019-09-27 DIAGNOSIS — Z7952 Long term (current) use of systemic steroids: Secondary | ICD-10-CM | POA: Diagnosis not present

## 2019-09-27 DIAGNOSIS — Z9181 History of falling: Secondary | ICD-10-CM | POA: Diagnosis not present

## 2019-09-27 DIAGNOSIS — Z466 Encounter for fitting and adjustment of urinary device: Secondary | ICD-10-CM | POA: Diagnosis not present

## 2019-09-27 DIAGNOSIS — G8252 Quadriplegia, C1-C4 incomplete: Secondary | ICD-10-CM | POA: Diagnosis not present

## 2019-09-27 DIAGNOSIS — D510 Vitamin B12 deficiency anemia due to intrinsic factor deficiency: Secondary | ICD-10-CM | POA: Diagnosis not present

## 2019-09-27 DIAGNOSIS — Z950 Presence of cardiac pacemaker: Secondary | ICD-10-CM | POA: Diagnosis not present

## 2019-09-27 DIAGNOSIS — K5904 Chronic idiopathic constipation: Secondary | ICD-10-CM | POA: Diagnosis not present

## 2019-09-27 DIAGNOSIS — L89152 Pressure ulcer of sacral region, stage 2: Secondary | ICD-10-CM | POA: Diagnosis not present

## 2019-09-27 DIAGNOSIS — M542 Cervicalgia: Secondary | ICD-10-CM | POA: Diagnosis not present

## 2019-09-27 DIAGNOSIS — S14102S Unspecified injury at C2 level of cervical spinal cord, sequela: Secondary | ICD-10-CM | POA: Diagnosis not present

## 2019-09-29 DIAGNOSIS — R319 Hematuria, unspecified: Secondary | ICD-10-CM | POA: Diagnosis not present

## 2019-09-29 DIAGNOSIS — N319 Neuromuscular dysfunction of bladder, unspecified: Secondary | ICD-10-CM | POA: Diagnosis not present

## 2019-09-30 DIAGNOSIS — K224 Dyskinesia of esophagus: Secondary | ICD-10-CM | POA: Insufficient documentation

## 2019-09-30 DIAGNOSIS — R110 Nausea: Secondary | ICD-10-CM | POA: Diagnosis not present

## 2019-09-30 DIAGNOSIS — N390 Urinary tract infection, site not specified: Secondary | ICD-10-CM | POA: Diagnosis not present

## 2019-09-30 DIAGNOSIS — I442 Atrioventricular block, complete: Secondary | ICD-10-CM | POA: Insufficient documentation

## 2019-09-30 DIAGNOSIS — T83511A Infection and inflammatory reaction due to indwelling urethral catheter, initial encounter: Secondary | ICD-10-CM | POA: Diagnosis not present

## 2019-10-05 DIAGNOSIS — Z79899 Other long term (current) drug therapy: Secondary | ICD-10-CM | POA: Diagnosis not present

## 2019-10-05 DIAGNOSIS — N319 Neuromuscular dysfunction of bladder, unspecified: Secondary | ICD-10-CM | POA: Diagnosis not present

## 2019-10-05 DIAGNOSIS — E119 Type 2 diabetes mellitus without complications: Secondary | ICD-10-CM | POA: Diagnosis not present

## 2019-10-05 DIAGNOSIS — N318 Other neuromuscular dysfunction of bladder: Secondary | ICD-10-CM | POA: Diagnosis not present

## 2019-10-05 DIAGNOSIS — Z9884 Bariatric surgery status: Secondary | ICD-10-CM | POA: Diagnosis not present

## 2019-10-05 DIAGNOSIS — E039 Hypothyroidism, unspecified: Secondary | ICD-10-CM | POA: Diagnosis not present

## 2019-10-05 DIAGNOSIS — Z950 Presence of cardiac pacemaker: Secondary | ICD-10-CM | POA: Diagnosis not present

## 2019-10-05 DIAGNOSIS — S14152S Other incomplete lesion at C2 level of cervical spinal cord, sequela: Secondary | ICD-10-CM | POA: Diagnosis not present

## 2019-10-05 DIAGNOSIS — S14109S Unspecified injury at unspecified level of cervical spinal cord, sequela: Secondary | ICD-10-CM | POA: Diagnosis not present

## 2019-10-05 DIAGNOSIS — R338 Other retention of urine: Secondary | ICD-10-CM | POA: Diagnosis not present

## 2019-10-05 DIAGNOSIS — N3941 Urge incontinence: Secondary | ICD-10-CM | POA: Diagnosis not present

## 2019-10-05 DIAGNOSIS — G8252 Quadriplegia, C1-C4 incomplete: Secondary | ICD-10-CM | POA: Diagnosis not present

## 2019-10-05 DIAGNOSIS — I442 Atrioventricular block, complete: Secondary | ICD-10-CM | POA: Diagnosis not present

## 2019-10-05 DIAGNOSIS — G8250 Quadriplegia, unspecified: Secondary | ICD-10-CM | POA: Diagnosis not present

## 2019-10-05 DIAGNOSIS — N312 Flaccid neuropathic bladder, not elsewhere classified: Secondary | ICD-10-CM | POA: Diagnosis not present

## 2019-10-13 DIAGNOSIS — I10 Essential (primary) hypertension: Secondary | ICD-10-CM | POA: Diagnosis not present

## 2019-10-13 DIAGNOSIS — E039 Hypothyroidism, unspecified: Secondary | ICD-10-CM | POA: Diagnosis not present

## 2019-10-13 DIAGNOSIS — Z7401 Bed confinement status: Secondary | ICD-10-CM | POA: Diagnosis not present

## 2019-10-13 DIAGNOSIS — Z9181 History of falling: Secondary | ICD-10-CM | POA: Diagnosis not present

## 2019-10-13 DIAGNOSIS — S14102S Unspecified injury at C2 level of cervical spinal cord, sequela: Secondary | ICD-10-CM | POA: Diagnosis not present

## 2019-10-13 DIAGNOSIS — Z7952 Long term (current) use of systemic steroids: Secondary | ICD-10-CM | POA: Diagnosis not present

## 2019-10-13 DIAGNOSIS — M542 Cervicalgia: Secondary | ICD-10-CM | POA: Diagnosis not present

## 2019-10-13 DIAGNOSIS — D510 Vitamin B12 deficiency anemia due to intrinsic factor deficiency: Secondary | ICD-10-CM | POA: Diagnosis not present

## 2019-10-13 DIAGNOSIS — G8252 Quadriplegia, C1-C4 incomplete: Secondary | ICD-10-CM | POA: Diagnosis not present

## 2019-10-13 DIAGNOSIS — K5904 Chronic idiopathic constipation: Secondary | ICD-10-CM | POA: Diagnosis not present

## 2019-10-13 DIAGNOSIS — Z79899 Other long term (current) drug therapy: Secondary | ICD-10-CM | POA: Diagnosis not present

## 2019-10-13 DIAGNOSIS — L89152 Pressure ulcer of sacral region, stage 2: Secondary | ICD-10-CM | POA: Diagnosis not present

## 2019-10-13 DIAGNOSIS — Z950 Presence of cardiac pacemaker: Secondary | ICD-10-CM | POA: Diagnosis not present

## 2019-10-13 DIAGNOSIS — Z466 Encounter for fitting and adjustment of urinary device: Secondary | ICD-10-CM | POA: Diagnosis not present

## 2019-10-18 ENCOUNTER — Encounter (HOSPITAL_BASED_OUTPATIENT_CLINIC_OR_DEPARTMENT_OTHER): Payer: PPO | Attending: Internal Medicine | Admitting: Internal Medicine

## 2019-10-18 DIAGNOSIS — Z823 Family history of stroke: Secondary | ICD-10-CM | POA: Diagnosis not present

## 2019-10-18 DIAGNOSIS — I10 Essential (primary) hypertension: Secondary | ICD-10-CM | POA: Insufficient documentation

## 2019-10-18 DIAGNOSIS — Z8249 Family history of ischemic heart disease and other diseases of the circulatory system: Secondary | ICD-10-CM | POA: Diagnosis not present

## 2019-10-18 DIAGNOSIS — L89159 Pressure ulcer of sacral region, unspecified stage: Secondary | ICD-10-CM | POA: Diagnosis not present

## 2019-10-18 DIAGNOSIS — Z809 Family history of malignant neoplasm, unspecified: Secondary | ICD-10-CM | POA: Insufficient documentation

## 2019-10-18 DIAGNOSIS — Z8349 Family history of other endocrine, nutritional and metabolic diseases: Secondary | ICD-10-CM | POA: Insufficient documentation

## 2019-10-18 DIAGNOSIS — Z833 Family history of diabetes mellitus: Secondary | ICD-10-CM | POA: Diagnosis not present

## 2019-10-18 DIAGNOSIS — M4712 Other spondylosis with myelopathy, cervical region: Secondary | ICD-10-CM | POA: Insufficient documentation

## 2019-10-18 DIAGNOSIS — J45909 Unspecified asthma, uncomplicated: Secondary | ICD-10-CM | POA: Diagnosis not present

## 2019-10-18 DIAGNOSIS — G35 Multiple sclerosis: Secondary | ICD-10-CM | POA: Insufficient documentation

## 2019-10-18 DIAGNOSIS — E039 Hypothyroidism, unspecified: Secondary | ICD-10-CM | POA: Insufficient documentation

## 2019-10-18 DIAGNOSIS — L89150 Pressure ulcer of sacral region, unstageable: Secondary | ICD-10-CM | POA: Diagnosis not present

## 2019-10-18 DIAGNOSIS — G8250 Quadriplegia, unspecified: Secondary | ICD-10-CM | POA: Insufficient documentation
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
## 2019-10-19 NOTE — Progress Notes (Signed)
[name] ([id number]) Visit Report for 10/18/2019 Allergy List Details Patient Name: Date of Service: [name], [name] RLO TTE 10/18/2019 10:30 A M Medical Record Number: [id number] Patient Account Number: [id number] Date of Birth/Sex: Treating RN: May 17, 1945 (74 y.o. Female) Yevonne Pax Primary Care Shawntez Dickison: Aleatha Borer Other Clinician: Referring Arshdeep Bolger: Treating Keshon Markovitz/Extender: Rosezena Sensor in Treatment: 0 Allergies Active Allergies No Known Drug Allergies Allergy Notes Electronic Signature(s) Signed: 10/19/2019 5:22:48 PM By: Yevonne Pax RN Entered By: Yevonne Pax on 10/18/2019 11:00:18 -------------------------------------------------------------------------------- Arrival Information Details Patient Name: Date of Service: [name] RLO TTE 10/18/2019 10:30 A M Medical Record Number: [id number] Patient Account Number: [id number] Date of Birth/Sex: Treating RN: 17-Feb-1946 (74 y.o. Female) Yevonne Pax Primary Care Audie Wieser: Aleatha Borer Other Clinician: Referring Shaniqwa Horsman: Treating Destane Speas/Extender: Rosezena Sensor in Treatment: 0 Visit Information Patient Arrived: Wheel Chair Arrival Time: 10:58 Accompanied By: daughter Transfer Assistance: [name] Lift Patient Identification Verified: Yes Secondary Verification Process Completed: Yes Patient Requires Transmission-Based Precautions: No Patient Has Alerts: No Electronic Signature(s) Signed: 10/19/2019 5:22:48 PM By: Yevonne Pax RN Entered By: Yevonne Pax on 10/18/2019 10:58:51 -------------------------------------------------------------------------------- Clinic Level of Care Assessment Details Patient Name: Date of Service: [name] RLO TTE 10/18/2019 10:30 A M Medical Record Number: [id number] Patient Account Number: [id number] Date of Birth/Sex: Treating RN: 10-Jun-1945 (74 y.o. Female) Zandra Abts Primary Care Batoul Limes: Aleatha Borer Other Clinician: Referring Arkin Imran: Treating Tate Jerkins/Extender: Rosezena Sensor in Treatment: 0 Clinic Level of Care Assessment Items TOOL 2 Quantity Score X- 1 0 Use when only an EandM is performed on the INITIAL visit ASSESSMENTS - Nursing Assessment / Reassessment X- 1 20 General Physical Exam (combine w/ comprehensive assessment (listed just below) when performed on new pt. evals) X- 1 25 Comprehensive Assessment (HX, ROS, Risk Assessments, Wounds Hx, etc.) ASSESSMENTS - Wound and Skin A ssessment / Reassessment []  - 0 Simple Wound Assessment / Reassessment - one wound []  - 0 Complex Wound Assessment / Reassessment - multiple wounds []  - 0 Dermatologic / Skin Assessment (not related to wound area) ASSESSMENTS - Ostomy and/or Continence Assessment and Care []  - 0 Incontinence Assessment and Management []  - 0 Ostomy Care Assessment and Management (repouching, etc.) PROCESS - Coordination of Care X - Simple Patient / Family Education for ongoing care 1 15 []  - 0 Complex (extensive) Patient / Family Education for ongoing care X- 1 10 Staff obtains , Records, T Results / Process Orders est X- 1 10 Staff telephones HHA, Nursing Homes / Clarify orders / etc []  - 0 Routine Transfer to another Facility (non-emergent condition) []  - 0 Routine Hospital Admission (non-emergent condition) X- 1 15 New Admissions / / Ordering NPWT Apligraf, etc. , []  - 0 Emergency Hospital Admission (emergent condition) X- 1 10 Simple Discharge Coordination []  - 0 Complex (extensive) Discharge Coordination PROCESS - Special Needs []  - 0 Pediatric / Minor Patient Management []  - 0 Isolation Patient Management []  - 0 Hearing / Language / Visual special needs []  - 0 Assessment of Community assistance (transportation, D/C planning, etc.) []  - 0 Additional assistance / Altered mentation []  - 0 Support Surface(s) Assessment  (bed, cushion, seat, etc.) INTERVENTIONS - Wound Cleansing / Measurement []  - 0 Wound Imaging (photographs - any number of wounds) []  - 0 Wound Tracing (instead of photographs) []  - 0 Simple Wound Measurement - one wound []  - 0 Complex Wound Measurement - multiple wounds []  - 0 Simple Wound Cleansing - one wound []  -  0 Complex Wound Cleansing - multiple wounds INTERVENTIONS - Wound Dressings []  - 0 Small Wound Dressing one or multiple wounds []  - 0 Medium Wound Dressing one or multiple wounds []  - 0 Large Wound Dressing one or multiple wounds []  - 0 Application of Medications - injection INTERVENTIONS - Miscellaneous []  - 0 External ear exam []  - 0 Specimen Collection (cultures, biopsies, blood, body fluids, etc.) []  - 0 Specimen(s) / Culture(s) sent or taken to Lab for analysis X- 1 10 Patient Transfer (multiple staff / Lift / Similar devices) []  - 0 Simple Staple / Suture removal (25 or less) []  - 0 Complex Staple / Suture removal (26 or more) []  - 0 Hypo / Hyperglycemic Management (close monitor of Blood Glucose) []  - 0 Ankle / Brachial Index (ABI) - do not check if billed separately Has the patient been seen at the hospital within the last three years: Yes Total Score: 115 Level Of Care: New/Established - Level 3 Electronic Signature(s) Signed: 10/18/2019 6:01:25 PM By: RN, BSN Entered By: on 10/18/2019 16:47:14 -------------------------------------------------------------------------------- Encounter Discharge Information Details Patient Name: Date of Service: RLO TTE 10/18/2019 10:30 A M Medical Record Number: [name] Patient Account Number: Date of Birth/Sex: Treating RN: [birth year] (74 y.o. Female) Primary Care Pleas Carneal: 10/20/2019 Other Clinician: Referring Arleth Mccullar: Treating Eltha Tingley/Extender: Zandra Abts in Treatment: 0 Encounter Discharge  Information Items Discharge Condition: Stable Ambulatory Status: Wheelchair Discharge Destination: Home Transportation: Private Auto Accompanied By: daughter Schedule Follow-up Appointment: Yes Clinical Summary of Care: Patient Declined Electronic Signature(s) Signed: 10/18/2019 6:01:25 PM By: 10/20/2019 RN, BSN Entered By: [name] on 10/18/2019 17:04:41 -------------------------------------------------------------------------------- Multi Wound Chart Details Patient Name: Date of Service: [id number] RLO TTE 10/18/2019 10:30 A M Medical Record Number: 12/08/1945 Patient Account Number: [id number] Date of Birth/Sex: Treating RN: 1945-09-15 (74 y.o. Female) Rosezena Sensor Primary Care Laura-Lee Villegas: 10/20/2019 Other Clinician: Referring Myrtle Barnhard: Treating Clemence Lengyel/Extender: Zandra Abts in Treatment: 0 Vital Signs Height(in): 65 Pulse(bpm): 90 Weight(lbs): 130 Blood Pressure(mmHg): 128/78 Body Mass Index(BMI): 22 Temperature(F): 98.4 Respiratory Rate(breaths/min): 18 Wound Assessments Treatment Notes Electronic Signature(s) Signed: 10/18/2019 5:46:39 PM By: 10/20/2019 MD Signed: 10/18/2019 6:01:25 PM By: 10/20/2019 RN, BSN Entered By: [id number] on 10/18/2019 12:22:46 -------------------------------------------------------------------------------- Pain Assessment Details Patient Name: Date of Service: 12/08/1945 RLO TTE 10/18/2019 10:30 A M Medical Record Number: Zandra Abts Patient Account Number: Aleatha Borer Date of Birth/Sex: Treating RN: [birth year] (73 y.o. Female) Baltazar Najjar Primary Care Jaequan Propes: 10/20/2019 Other Clinician: Referring Shaqueena Mauceri: Treating Quentyn Kolbeck/Extender: Zandra Abts in Treatment: 0 Active Problems Location of Pain Severity and Description of Pain Patient Has Paino Yes Site Locations With Dressing Change: Yes Duration of the Pain. Constant / Intermittento  Constant Rate the pain. Current Pain Level: 5 Worst Pain Level: 10 Least Pain Level: 3 Tolerable Pain Level: 5 Character of Pain Describe the Pain: Other: pinching Pain Management and Medication Current Pain Management: Medication: No Cold Application: No Rest: No Massage: No Activity: No T.E.N.S.: No Heat Application: No Leg drop or elevation: No Is the Current Pain Management Adequate: Inadequate How does your wound impact your activities of daily livingo Sleep: No Bathing: No Appetite: No Relationship With Others: No Bladder Continence: No Emotions: No Bowel Continence: No Work: No Toileting: No Drive: No Dressing: No Hobbies: No Electronic Signature(s) Signed: 10/19/2019 5:22:48 PM By: 10/20/2019 RN Entered By: [name] on 10/18/2019 11:24:10 -------------------------------------------------------------------------------- Patient/Caregiver Education  Details Patient Name: Date of Service: AVORY, RAHIMI RLO TTE 6/28/2021andnbsp10:30 A M Medical Record Number: [id number] Patient Account Number: [id number] Date of Birth/Gender: Treating RN: 1946-02-03 (74 y.o. Female) Zandra Abts Primary Care Physician: Aleatha Borer Other Clinician: Referring Physician: Treating Physician/Extender: Rosezena Sensor in Treatment: 0 Education Assessment Education Provided To: Patient Education Topics Provided Pressure: Methods: Explain/Verbal Responses: State content correctly Wound/Skin Impairment: Methods: Explain/Verbal Responses: State content correctly Electronic Signature(s) Signed: 10/18/2019 6:01:25 PM By: Zandra Abts RN, BSN Entered By: Zandra Abts on 10/18/2019 16:33:51 -------------------------------------------------------------------------------- Vitals Details Patient Name: Date of Service: [name] RLO TTE 10/18/2019 10:30 A M Medical Record Number: [id number] Patient Account Number: [id number] Date of  Birth/Sex: Treating RN: 1945-08-04 (74 y.o. Female) Epps, Carrie Primary Care Dashauna Heymann: Aleatha Borer Other Clinician: Referring Silvina Hackleman: Treating Jimena Wieczorek/Extender: Rosezena Sensor in Treatment: 0 Vital Signs Time Taken: 10:58 Temperature (F): 98.4 Height (in): 65 Pulse (bpm): 90 Source: Stated Respiratory Rate (breaths/min): 18 Weight (lbs): 130 Blood Pressure (mmHg): 128/78 Source: Stated Reference Range: 80 - 120 mg / dl Body Mass Index (BMI): 21.6 Electronic Signature(s) Signed: 10/19/2019 5:22:48 PM By: Yevonne Pax RN Entered By: Yevonne Pax on 10/18/2019 10:59:50

## 2019-10-19 NOTE — Progress Notes (Signed)
STEELE, LEDONNE ([id number]) Visit Report for 10/18/2019 Abuse/Suicide Risk Screen Details Patient Name: Date of Service: [name], [name] RLO TTE 10/18/2019 10:30 A M Medical Record Number: [id number] Patient Account Number: [id number] Date of Birth/Sex: Treating RN: 1945-11-08 (74 y.o. Female) Yevonne Pax Primary Care Haakon Titsworth: Aleatha Borer Other Clinician: Referring Tegh Franek: Treating Cliffie Gingras/Extender: Rosezena Sensor in Treatment: 0 Abuse/Suicide Risk Screen Items Answer ABUSE RISK SCREEN: Has anyone close to you tried to hurt or harm you recentlyo No Do you feel uncomfortable with anyone in your familyo No Has anyone forced you do things that you didnt want to doo No Electronic Signature(s) Signed: 10/19/2019 5:22:48 PM By: Yevonne Pax RN Entered By: Yevonne Pax on 10/18/2019 11:08:12 -------------------------------------------------------------------------------- Activities of Daily Living Details Patient Name: Date of Service: [name], [name] RLO TTE 10/18/2019 10:30 A M Medical Record Number: [id number] Patient Account Number: [id number] Date of Birth/Sex: Treating RN: Jan 30, 1946 (74 y.o. Female) Yevonne Pax Primary Care Antwine Agosto: Aleatha Borer Other Clinician: Referring Orella Cushman: Treating Tajae Rybicki/Extender: Rosezena Sensor in Treatment: 0 Activities of Daily Living Items Answer Activities of Daily Living (Please select one for each item) Drive Automobile Not Able T Medications ake Not Able Use T elephone Not Able Care for Appearance Not Able Use T oilet Not Able Mady Haagensen / Shower Not Able Dress Self Not Able Feed Self Not Able Walk Not Able Get In / Out Bed Not Able Housework Not Able Prepare Meals Not Able Handle Money Not Able Shop for Self Not Able Electronic Signature(s) Signed: 10/19/2019 5:22:48 PM By: Yevonne Pax RN Entered By: Yevonne Pax on 10/18/2019  11:08:55 -------------------------------------------------------------------------------- Education Screening Details Patient Name: Date of Service: [name] RLO TTE 10/18/2019 10:30 A M Medical Record Number: [id number] Patient Account Number: [id number] Date of Birth/Sex: Treating RN: [birth year] (74 y.o. Female) Yevonne Pax Primary Care Torryn Fiske: Aleatha Borer Other Clinician: Referring Danie Diehl: Treating Neeka Urista/Extender: Rosezena Sensor in Treatment: 0 Primary Learner Assessed: Patient Learning Preferences/Education Level/Primary Language Learning Preference: Explanation Highest Education Level: College or Above Preferred Language: English Cognitive Barrier Language Barrier: No Translator Needed: No Memory Deficit: No Emotional Barrier: No Cultural/Religious Beliefs Affecting Medical Care: No Physical Barrier Impaired Vision: Yes Glasses Impaired Hearing: No Decreased Hand dexterity: No Knowledge/Comprehension Knowledge Level: High Comprehension Level: High Ability to understand written instructions: High Ability to understand verbal instructions: High Motivation Anxiety Level: Calm Cooperation: Cooperative Education Importance: Acknowledges Need Interest in Health Problems: Asks Questions Perception: Coherent Willingness to Engage in Self-Management High Activities: Readiness to Engage in Self-Management High Activities: Electronic Signature(s) Signed: 10/19/2019 5:22:48 PM By: Yevonne Pax RN Entered By: Yevonne Pax on 10/18/2019 11:09:35 -------------------------------------------------------------------------------- Fall Risk Assessment Details Patient Name: Date of Service: [name] RLO TTE 10/18/2019 10:30 A M Medical Record Number: [id number] Patient Account Number: [id number] Date of Birth/Sex: Treating RN: January 18, 1946 (74 y.o. Female) Yevonne Pax Primary Care Dodi Leu: Aleatha Borer Other Clinician: Referring  Toryn Dewalt: Treating Kaitlynd Phillips/Extender: Rosezena Sensor in Treatment: 0 Fall Risk Assessment Items Have you had 2 or more falls in the last 12 monthso 0 No Have you had any fall that resulted in injury in the last 12 monthso 0 No FALLS RISK SCREEN History of falling - immediate or within 3 months 0 No Secondary diagnosis (Do you have 2 or more medical diagnoseso) 0 No Ambulatory aid None/bed rest/wheelchair/nurse 0 No Crutches/cane/walker 0 No Furniture 0 No Intravenous therapy Access/Saline/Heparin Lock 0 No Gait/Transferring Normal/ bed rest/ wheelchair 0 No Weak (short steps with or  without shuffle, stooped but able to lift head while walking, may seek 0 No support from furniture) Impaired (short steps with shuffle, may have difficulty arising from chair, head down, impaired 0 No balance) Mental Status Oriented to own ability 0 No Electronic Signature(s) Signed: 10/19/2019 5:22:48 PM By: Yevonne Pax RN Entered By: Yevonne Pax on 10/18/2019 11:09:42 -------------------------------------------------------------------------------- Nutrition Risk Screening Details Patient Name: Date of Service: [name], [name] RLO TTE 10/18/2019 10:30 A M Medical Record Number: [id number] Patient Account Number: [id number] Date of Birth/Sex: Treating RN: 07-10-1945 (74 y.o. Female) Yevonne Pax Primary Care Lorren Rossetti: Aleatha Borer Other Clinician: Referring Farhaan Mabee: Treating Myeshia Fojtik/Extender: Rosezena Sensor in Treatment: 0 Height (in): 65 Weight (lbs): 130 Body Mass Index (BMI): 21.6 Nutrition Risk Screening Items Score Screening NUTRITION RISK SCREEN: I have an illness or condition that made me change the kind and/or amount of food I eat 0 No I eat fewer than two meals per day 0 No I eat few fruits and vegetables, or milk products 0 No I have three or more drinks of beer, liquor or wine almost every day 0 No I have tooth or mouth  problems that make it hard for me to eat 0 No I don't always have enough money to buy the food I need 0 No I eat alone most of the time 0 No I take three or more different prescribed or over-the-counter drugs a day 1 Yes Without wanting to, I have lost or gained 10 pounds in the last six months 0 No I am not always physically able to shop, cook and/or feed myself 2 Yes Nutrition Protocols Good Risk Protocol Moderate Risk Protocol 0 Provide education on nutrition High Risk Proctocol Risk Level: Moderate Risk Score: 3 Electronic Signature(s) Signed: 10/19/2019 5:22:48 PM By: Yevonne Pax RN Entered By: Yevonne Pax on 10/18/2019 11:09:59

## 2019-10-20 DIAGNOSIS — N319 Neuromuscular dysfunction of bladder, unspecified: Secondary | ICD-10-CM | POA: Diagnosis not present

## 2019-10-26 DIAGNOSIS — K5904 Chronic idiopathic constipation: Secondary | ICD-10-CM | POA: Diagnosis not present

## 2019-10-26 DIAGNOSIS — Z950 Presence of cardiac pacemaker: Secondary | ICD-10-CM | POA: Diagnosis not present

## 2019-10-26 DIAGNOSIS — Z466 Encounter for fitting and adjustment of urinary device: Secondary | ICD-10-CM | POA: Diagnosis not present

## 2019-10-26 DIAGNOSIS — S14102S Unspecified injury at C2 level of cervical spinal cord, sequela: Secondary | ICD-10-CM | POA: Diagnosis not present

## 2019-10-26 DIAGNOSIS — L89152 Pressure ulcer of sacral region, stage 2: Secondary | ICD-10-CM | POA: Diagnosis not present

## 2019-10-26 DIAGNOSIS — Z79899 Other long term (current) drug therapy: Secondary | ICD-10-CM | POA: Diagnosis not present

## 2019-10-26 DIAGNOSIS — Z7401 Bed confinement status: Secondary | ICD-10-CM | POA: Diagnosis not present

## 2019-10-26 DIAGNOSIS — G8252 Quadriplegia, C1-C4 incomplete: Secondary | ICD-10-CM | POA: Diagnosis not present

## 2019-10-26 DIAGNOSIS — E039 Hypothyroidism, unspecified: Secondary | ICD-10-CM | POA: Diagnosis not present

## 2019-10-26 DIAGNOSIS — Z9181 History of falling: Secondary | ICD-10-CM | POA: Diagnosis not present

## 2019-10-26 DIAGNOSIS — M542 Cervicalgia: Secondary | ICD-10-CM | POA: Diagnosis not present

## 2019-10-26 DIAGNOSIS — D510 Vitamin B12 deficiency anemia due to intrinsic factor deficiency: Secondary | ICD-10-CM | POA: Diagnosis not present

## 2019-10-26 DIAGNOSIS — I10 Essential (primary) hypertension: Secondary | ICD-10-CM | POA: Diagnosis not present

## 2019-10-26 DIAGNOSIS — Z7952 Long term (current) use of systemic steroids: Secondary | ICD-10-CM | POA: Diagnosis not present

## 2019-10-27 DIAGNOSIS — N319 Neuromuscular dysfunction of bladder, unspecified: Secondary | ICD-10-CM | POA: Diagnosis not present

## 2019-10-28 DIAGNOSIS — I361 Nonrheumatic tricuspid (valve) insufficiency: Secondary | ICD-10-CM | POA: Diagnosis not present

## 2019-10-28 DIAGNOSIS — I10 Essential (primary) hypertension: Secondary | ICD-10-CM | POA: Diagnosis not present

## 2019-10-28 DIAGNOSIS — I480 Paroxysmal atrial fibrillation: Secondary | ICD-10-CM | POA: Diagnosis not present

## 2019-10-28 DIAGNOSIS — I340 Nonrheumatic mitral (valve) insufficiency: Secondary | ICD-10-CM | POA: Diagnosis not present

## 2019-10-28 DIAGNOSIS — I428 Other cardiomyopathies: Secondary | ICD-10-CM | POA: Diagnosis not present

## 2019-10-28 DIAGNOSIS — Z950 Presence of cardiac pacemaker: Secondary | ICD-10-CM | POA: Diagnosis not present

## 2019-11-02 DIAGNOSIS — Z9359 Other cystostomy status: Secondary | ICD-10-CM | POA: Diagnosis not present

## 2019-11-03 ENCOUNTER — Encounter: Payer: Self-pay | Admitting: Neurology

## 2019-11-03 ENCOUNTER — Ambulatory Visit: Payer: PPO | Admitting: Neurology

## 2019-11-03 ENCOUNTER — Other Ambulatory Visit: Payer: Self-pay

## 2019-11-03 VITALS — BP 102/74 | HR 76

## 2019-11-03 DIAGNOSIS — G8250 Quadriplegia, unspecified: Secondary | ICD-10-CM

## 2019-11-03 DIAGNOSIS — R300 Dysuria: Secondary | ICD-10-CM | POA: Diagnosis not present

## 2019-11-03 DIAGNOSIS — Z9359 Other cystostomy status: Secondary | ICD-10-CM | POA: Diagnosis not present

## 2019-11-03 MED ORDER — INCOBOTULINUMTOXINA 100 UNITS IM SOLR
600.0000 [IU] | INTRAMUSCULAR | Status: DC
  Administered 2019-11-03: 600 [IU] via INTRAMUSCULAR

## 2019-11-03 NOTE — Progress Notes (Addendum)
**  Xeomin 100 units x 6 vials, NDC 0259-1610-01, Lot [id number], Exp 03/2021, office supply.//mck,rn** 
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
## 2019-11-24 DIAGNOSIS — R300 Dysuria: Secondary | ICD-10-CM | POA: Diagnosis not present

## 2019-11-24 DIAGNOSIS — Z9359 Other cystostomy status: Secondary | ICD-10-CM | POA: Diagnosis not present

## 2019-11-25 DIAGNOSIS — Z7401 Bed confinement status: Secondary | ICD-10-CM | POA: Diagnosis not present

## 2019-11-25 DIAGNOSIS — Z79899 Other long term (current) drug therapy: Secondary | ICD-10-CM | POA: Diagnosis not present

## 2019-11-25 DIAGNOSIS — E039 Hypothyroidism, unspecified: Secondary | ICD-10-CM | POA: Diagnosis not present

## 2019-11-25 DIAGNOSIS — Z466 Encounter for fitting and adjustment of urinary device: Secondary | ICD-10-CM | POA: Diagnosis not present

## 2019-11-25 DIAGNOSIS — G8252 Quadriplegia, C1-C4 incomplete: Secondary | ICD-10-CM | POA: Diagnosis not present

## 2019-11-25 DIAGNOSIS — I10 Essential (primary) hypertension: Secondary | ICD-10-CM | POA: Diagnosis not present

## 2019-11-25 DIAGNOSIS — Z9181 History of falling: Secondary | ICD-10-CM | POA: Diagnosis not present

## 2019-11-25 DIAGNOSIS — D510 Vitamin B12 deficiency anemia due to intrinsic factor deficiency: Secondary | ICD-10-CM | POA: Diagnosis not present

## 2019-11-25 DIAGNOSIS — K5904 Chronic idiopathic constipation: Secondary | ICD-10-CM | POA: Diagnosis not present

## 2019-11-25 DIAGNOSIS — S14102S Unspecified injury at C2 level of cervical spinal cord, sequela: Secondary | ICD-10-CM | POA: Diagnosis not present

## 2019-11-25 DIAGNOSIS — M542 Cervicalgia: Secondary | ICD-10-CM | POA: Diagnosis not present

## 2019-11-25 DIAGNOSIS — Z950 Presence of cardiac pacemaker: Secondary | ICD-10-CM | POA: Diagnosis not present

## 2019-12-01 DIAGNOSIS — Z1211 Encounter for screening for malignant neoplasm of colon: Secondary | ICD-10-CM | POA: Diagnosis not present

## 2019-12-07 DIAGNOSIS — Z79899 Other long term (current) drug therapy: Secondary | ICD-10-CM | POA: Diagnosis not present

## 2019-12-07 DIAGNOSIS — G8252 Quadriplegia, C1-C4 incomplete: Secondary | ICD-10-CM | POA: Diagnosis not present

## 2019-12-07 DIAGNOSIS — Z9181 History of falling: Secondary | ICD-10-CM | POA: Diagnosis not present

## 2019-12-07 DIAGNOSIS — D510 Vitamin B12 deficiency anemia due to intrinsic factor deficiency: Secondary | ICD-10-CM | POA: Diagnosis not present

## 2019-12-07 DIAGNOSIS — Z7401 Bed confinement status: Secondary | ICD-10-CM | POA: Diagnosis not present

## 2019-12-07 DIAGNOSIS — K5904 Chronic idiopathic constipation: Secondary | ICD-10-CM | POA: Diagnosis not present

## 2019-12-07 DIAGNOSIS — S14102S Unspecified injury at C2 level of cervical spinal cord, sequela: Secondary | ICD-10-CM | POA: Diagnosis not present

## 2019-12-07 DIAGNOSIS — I10 Essential (primary) hypertension: Secondary | ICD-10-CM | POA: Diagnosis not present

## 2019-12-07 DIAGNOSIS — M542 Cervicalgia: Secondary | ICD-10-CM | POA: Diagnosis not present

## 2019-12-07 DIAGNOSIS — E039 Hypothyroidism, unspecified: Secondary | ICD-10-CM | POA: Diagnosis not present

## 2019-12-07 DIAGNOSIS — Z950 Presence of cardiac pacemaker: Secondary | ICD-10-CM | POA: Diagnosis not present

## 2019-12-07 DIAGNOSIS — Z466 Encounter for fitting and adjustment of urinary device: Secondary | ICD-10-CM | POA: Diagnosis not present

## 2019-12-08 LAB — COLOGUARD: COLOGUARD: NEGATIVE

## 2019-12-14 DIAGNOSIS — I10 Essential (primary) hypertension: Secondary | ICD-10-CM | POA: Diagnosis not present

## 2019-12-14 DIAGNOSIS — S14102S Unspecified injury at C2 level of cervical spinal cord, sequela: Secondary | ICD-10-CM | POA: Diagnosis not present

## 2019-12-14 DIAGNOSIS — Z79899 Other long term (current) drug therapy: Secondary | ICD-10-CM | POA: Diagnosis not present

## 2019-12-14 DIAGNOSIS — E039 Hypothyroidism, unspecified: Secondary | ICD-10-CM | POA: Diagnosis not present

## 2019-12-14 DIAGNOSIS — K5904 Chronic idiopathic constipation: Secondary | ICD-10-CM | POA: Diagnosis not present

## 2019-12-14 DIAGNOSIS — Z7401 Bed confinement status: Secondary | ICD-10-CM | POA: Diagnosis not present

## 2019-12-14 DIAGNOSIS — G8252 Quadriplegia, C1-C4 incomplete: Secondary | ICD-10-CM | POA: Diagnosis not present

## 2019-12-14 DIAGNOSIS — Z950 Presence of cardiac pacemaker: Secondary | ICD-10-CM | POA: Diagnosis not present

## 2019-12-14 DIAGNOSIS — D510 Vitamin B12 deficiency anemia due to intrinsic factor deficiency: Secondary | ICD-10-CM | POA: Diagnosis not present

## 2019-12-14 DIAGNOSIS — Z9181 History of falling: Secondary | ICD-10-CM | POA: Diagnosis not present

## 2019-12-14 DIAGNOSIS — Z466 Encounter for fitting and adjustment of urinary device: Secondary | ICD-10-CM | POA: Diagnosis not present

## 2019-12-14 DIAGNOSIS — M542 Cervicalgia: Secondary | ICD-10-CM | POA: Diagnosis not present

## 2019-12-20 DIAGNOSIS — R339 Retention of urine, unspecified: Secondary | ICD-10-CM | POA: Diagnosis not present

## 2019-12-29 DIAGNOSIS — Z7401 Bed confinement status: Secondary | ICD-10-CM | POA: Diagnosis not present

## 2019-12-29 DIAGNOSIS — Z9181 History of falling: Secondary | ICD-10-CM | POA: Diagnosis not present

## 2019-12-29 DIAGNOSIS — E039 Hypothyroidism, unspecified: Secondary | ICD-10-CM | POA: Diagnosis not present

## 2019-12-29 DIAGNOSIS — D510 Vitamin B12 deficiency anemia due to intrinsic factor deficiency: Secondary | ICD-10-CM | POA: Diagnosis not present

## 2019-12-29 DIAGNOSIS — K5904 Chronic idiopathic constipation: Secondary | ICD-10-CM | POA: Diagnosis not present

## 2019-12-29 DIAGNOSIS — I10 Essential (primary) hypertension: Secondary | ICD-10-CM | POA: Diagnosis not present

## 2019-12-29 DIAGNOSIS — Z466 Encounter for fitting and adjustment of urinary device: Secondary | ICD-10-CM | POA: Diagnosis not present

## 2019-12-29 DIAGNOSIS — M542 Cervicalgia: Secondary | ICD-10-CM | POA: Diagnosis not present

## 2019-12-29 DIAGNOSIS — Z79899 Other long term (current) drug therapy: Secondary | ICD-10-CM | POA: Diagnosis not present

## 2019-12-29 DIAGNOSIS — S14102S Unspecified injury at C2 level of cervical spinal cord, sequela: Secondary | ICD-10-CM | POA: Diagnosis not present

## 2019-12-29 DIAGNOSIS — G8252 Quadriplegia, C1-C4 incomplete: Secondary | ICD-10-CM | POA: Diagnosis not present

## 2019-12-29 DIAGNOSIS — Z950 Presence of cardiac pacemaker: Secondary | ICD-10-CM | POA: Diagnosis not present

## 2019-12-31 DIAGNOSIS — N319 Neuromuscular dysfunction of bladder, unspecified: Secondary | ICD-10-CM | POA: Diagnosis not present

## 2020-01-20 DIAGNOSIS — Z9359 Other cystostomy status: Secondary | ICD-10-CM | POA: Diagnosis not present

## 2020-01-21 DIAGNOSIS — Z79899 Other long term (current) drug therapy: Secondary | ICD-10-CM | POA: Diagnosis not present

## 2020-01-21 DIAGNOSIS — G8252 Quadriplegia, C1-C4 incomplete: Secondary | ICD-10-CM | POA: Diagnosis not present

## 2020-01-21 DIAGNOSIS — S14102S Unspecified injury at C2 level of cervical spinal cord, sequela: Secondary | ICD-10-CM | POA: Diagnosis not present

## 2020-01-21 DIAGNOSIS — K5904 Chronic idiopathic constipation: Secondary | ICD-10-CM | POA: Diagnosis not present

## 2020-01-21 DIAGNOSIS — E039 Hypothyroidism, unspecified: Secondary | ICD-10-CM | POA: Diagnosis not present

## 2020-01-21 DIAGNOSIS — Z7401 Bed confinement status: Secondary | ICD-10-CM | POA: Diagnosis not present

## 2020-01-21 DIAGNOSIS — I10 Essential (primary) hypertension: Secondary | ICD-10-CM | POA: Diagnosis not present

## 2020-01-21 DIAGNOSIS — M542 Cervicalgia: Secondary | ICD-10-CM | POA: Diagnosis not present

## 2020-01-21 DIAGNOSIS — Z950 Presence of cardiac pacemaker: Secondary | ICD-10-CM | POA: Diagnosis not present

## 2020-01-21 DIAGNOSIS — Z466 Encounter for fitting and adjustment of urinary device: Secondary | ICD-10-CM | POA: Diagnosis not present

## 2020-01-21 DIAGNOSIS — Z9181 History of falling: Secondary | ICD-10-CM | POA: Diagnosis not present

## 2020-01-21 DIAGNOSIS — D510 Vitamin B12 deficiency anemia due to intrinsic factor deficiency: Secondary | ICD-10-CM | POA: Diagnosis not present

## 2020-01-29 DIAGNOSIS — K5904 Chronic idiopathic constipation: Secondary | ICD-10-CM | POA: Diagnosis not present

## 2020-01-29 DIAGNOSIS — Z950 Presence of cardiac pacemaker: Secondary | ICD-10-CM | POA: Diagnosis not present

## 2020-01-29 DIAGNOSIS — G35 Multiple sclerosis: Secondary | ICD-10-CM | POA: Diagnosis not present

## 2020-01-29 DIAGNOSIS — Z7401 Bed confinement status: Secondary | ICD-10-CM | POA: Diagnosis not present

## 2020-01-29 DIAGNOSIS — Z79899 Other long term (current) drug therapy: Secondary | ICD-10-CM | POA: Diagnosis not present

## 2020-01-29 DIAGNOSIS — D510 Vitamin B12 deficiency anemia due to intrinsic factor deficiency: Secondary | ICD-10-CM | POA: Diagnosis not present

## 2020-01-29 DIAGNOSIS — Z87440 Personal history of urinary (tract) infections: Secondary | ICD-10-CM | POA: Diagnosis not present

## 2020-01-29 DIAGNOSIS — M545 Low back pain, unspecified: Secondary | ICD-10-CM | POA: Diagnosis not present

## 2020-01-29 DIAGNOSIS — G8929 Other chronic pain: Secondary | ICD-10-CM | POA: Diagnosis not present

## 2020-01-29 DIAGNOSIS — Z79891 Long term (current) use of opiate analgesic: Secondary | ICD-10-CM | POA: Diagnosis not present

## 2020-01-29 DIAGNOSIS — Z960 Presence of urogenital implants: Secondary | ICD-10-CM | POA: Diagnosis not present

## 2020-01-29 DIAGNOSIS — E039 Hypothyroidism, unspecified: Secondary | ICD-10-CM | POA: Diagnosis not present

## 2020-01-29 DIAGNOSIS — Z9181 History of falling: Secondary | ICD-10-CM | POA: Diagnosis not present

## 2020-01-29 DIAGNOSIS — I10 Essential (primary) hypertension: Secondary | ICD-10-CM | POA: Diagnosis not present

## 2020-01-29 DIAGNOSIS — G8252 Quadriplegia, C1-C4 incomplete: Secondary | ICD-10-CM | POA: Diagnosis not present

## 2020-01-29 DIAGNOSIS — M542 Cervicalgia: Secondary | ICD-10-CM | POA: Diagnosis not present

## 2020-01-31 DIAGNOSIS — K5900 Constipation, unspecified: Secondary | ICD-10-CM | POA: Diagnosis not present

## 2020-01-31 DIAGNOSIS — Z79899 Other long term (current) drug therapy: Secondary | ICD-10-CM | POA: Diagnosis not present

## 2020-01-31 DIAGNOSIS — Z888 Allergy status to other drugs, medicaments and biological substances status: Secondary | ICD-10-CM | POA: Diagnosis not present

## 2020-01-31 DIAGNOSIS — T83098A Other mechanical complication of other indwelling urethral catheter, initial encounter: Secondary | ICD-10-CM | POA: Diagnosis not present

## 2020-01-31 DIAGNOSIS — E079 Disorder of thyroid, unspecified: Secondary | ICD-10-CM | POA: Diagnosis not present

## 2020-01-31 DIAGNOSIS — Z950 Presence of cardiac pacemaker: Secondary | ICD-10-CM | POA: Diagnosis not present

## 2020-02-08 DIAGNOSIS — N319 Neuromuscular dysfunction of bladder, unspecified: Secondary | ICD-10-CM | POA: Diagnosis not present

## 2020-02-15 DIAGNOSIS — E039 Hypothyroidism, unspecified: Secondary | ICD-10-CM | POA: Diagnosis not present

## 2020-02-15 DIAGNOSIS — D510 Vitamin B12 deficiency anemia due to intrinsic factor deficiency: Secondary | ICD-10-CM | POA: Diagnosis not present

## 2020-02-15 DIAGNOSIS — I10 Essential (primary) hypertension: Secondary | ICD-10-CM | POA: Diagnosis not present

## 2020-02-15 DIAGNOSIS — Z7401 Bed confinement status: Secondary | ICD-10-CM | POA: Diagnosis not present

## 2020-02-15 DIAGNOSIS — K5904 Chronic idiopathic constipation: Secondary | ICD-10-CM | POA: Diagnosis not present

## 2020-02-15 DIAGNOSIS — G35 Multiple sclerosis: Secondary | ICD-10-CM | POA: Diagnosis not present

## 2020-02-15 DIAGNOSIS — Z960 Presence of urogenital implants: Secondary | ICD-10-CM | POA: Diagnosis not present

## 2020-02-15 DIAGNOSIS — Z950 Presence of cardiac pacemaker: Secondary | ICD-10-CM | POA: Diagnosis not present

## 2020-02-15 DIAGNOSIS — M542 Cervicalgia: Secondary | ICD-10-CM | POA: Diagnosis not present

## 2020-02-15 DIAGNOSIS — G8252 Quadriplegia, C1-C4 incomplete: Secondary | ICD-10-CM | POA: Diagnosis not present

## 2020-02-15 DIAGNOSIS — M545 Low back pain, unspecified: Secondary | ICD-10-CM | POA: Diagnosis not present

## 2020-02-15 DIAGNOSIS — Z79891 Long term (current) use of opiate analgesic: Secondary | ICD-10-CM | POA: Diagnosis not present

## 2020-02-15 DIAGNOSIS — Z79899 Other long term (current) drug therapy: Secondary | ICD-10-CM | POA: Diagnosis not present

## 2020-02-15 DIAGNOSIS — G8929 Other chronic pain: Secondary | ICD-10-CM | POA: Diagnosis not present

## 2020-02-15 DIAGNOSIS — Z9181 History of falling: Secondary | ICD-10-CM | POA: Diagnosis not present

## 2020-02-15 DIAGNOSIS — Z87440 Personal history of urinary (tract) infections: Secondary | ICD-10-CM | POA: Diagnosis not present

## 2020-02-18 DIAGNOSIS — Z9359 Other cystostomy status: Secondary | ICD-10-CM | POA: Diagnosis not present

## 2020-02-23 ENCOUNTER — Other Ambulatory Visit: Payer: Self-pay

## 2020-02-23 ENCOUNTER — Encounter: Payer: Self-pay | Admitting: Neurology

## 2020-02-23 ENCOUNTER — Ambulatory Visit: Payer: PPO | Admitting: Neurology

## 2020-02-23 VITALS — BP 112/72 | HR 72

## 2020-02-23 DIAGNOSIS — G8250 Quadriplegia, unspecified: Secondary | ICD-10-CM | POA: Diagnosis not present
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
## 2020-02-23 NOTE — Progress Notes (Signed)
**  Xeomin 100 units x 6 vials, NDC 0259-1610-01, Lot [id number], Exp 07/2021, office supply.//mck,rn** 

## 2020-02-24 DIAGNOSIS — G8250 Quadriplegia, unspecified: Secondary | ICD-10-CM

## 2020-02-24 MED ORDER — INCOBOTULINUMTOXINA 100 UNITS IM SOLR
600.0000 [IU] | INTRAMUSCULAR | Status: DC
  Administered 2020-02-24: 600 [IU] via INTRAMUSCULAR

## 2020-02-28 DIAGNOSIS — Z9359 Other cystostomy status: Secondary | ICD-10-CM | POA: Diagnosis not present

## 2020-03-07 DIAGNOSIS — N319 Neuromuscular dysfunction of bladder, unspecified: Secondary | ICD-10-CM | POA: Diagnosis not present

## 2020-03-20 DIAGNOSIS — I10 Essential (primary) hypertension: Secondary | ICD-10-CM | POA: Diagnosis not present

## 2020-03-20 DIAGNOSIS — Z79899 Other long term (current) drug therapy: Secondary | ICD-10-CM | POA: Diagnosis not present

## 2020-03-20 DIAGNOSIS — Z9181 History of falling: Secondary | ICD-10-CM | POA: Diagnosis not present

## 2020-03-20 DIAGNOSIS — M542 Cervicalgia: Secondary | ICD-10-CM | POA: Diagnosis not present

## 2020-03-20 DIAGNOSIS — G8252 Quadriplegia, C1-C4 incomplete: Secondary | ICD-10-CM | POA: Diagnosis not present

## 2020-03-20 DIAGNOSIS — D510 Vitamin B12 deficiency anemia due to intrinsic factor deficiency: Secondary | ICD-10-CM | POA: Diagnosis not present

## 2020-03-20 DIAGNOSIS — M545 Low back pain, unspecified: Secondary | ICD-10-CM | POA: Diagnosis not present

## 2020-03-20 DIAGNOSIS — G8929 Other chronic pain: Secondary | ICD-10-CM | POA: Diagnosis not present

## 2020-03-20 DIAGNOSIS — Z7401 Bed confinement status: Secondary | ICD-10-CM | POA: Diagnosis not present

## 2020-03-20 DIAGNOSIS — Z79891 Long term (current) use of opiate analgesic: Secondary | ICD-10-CM | POA: Diagnosis not present

## 2020-03-20 DIAGNOSIS — Z950 Presence of cardiac pacemaker: Secondary | ICD-10-CM | POA: Diagnosis not present

## 2020-03-20 DIAGNOSIS — G35 Multiple sclerosis: Secondary | ICD-10-CM | POA: Diagnosis not present

## 2020-03-20 DIAGNOSIS — Z87440 Personal history of urinary (tract) infections: Secondary | ICD-10-CM | POA: Diagnosis not present

## 2020-03-20 DIAGNOSIS — E039 Hypothyroidism, unspecified: Secondary | ICD-10-CM | POA: Diagnosis not present

## 2020-03-20 DIAGNOSIS — Z960 Presence of urogenital implants: Secondary | ICD-10-CM | POA: Diagnosis not present

## 2020-03-20 DIAGNOSIS — K5904 Chronic idiopathic constipation: Secondary | ICD-10-CM | POA: Diagnosis not present

## 2020-03-21 DIAGNOSIS — Z960 Presence of urogenital implants: Secondary | ICD-10-CM | POA: Diagnosis not present

## 2020-03-21 DIAGNOSIS — M545 Low back pain, unspecified: Secondary | ICD-10-CM | POA: Diagnosis not present

## 2020-03-21 DIAGNOSIS — Z79899 Other long term (current) drug therapy: Secondary | ICD-10-CM | POA: Diagnosis not present

## 2020-03-21 DIAGNOSIS — D510 Vitamin B12 deficiency anemia due to intrinsic factor deficiency: Secondary | ICD-10-CM | POA: Diagnosis not present

## 2020-03-21 DIAGNOSIS — I10 Essential (primary) hypertension: Secondary | ICD-10-CM | POA: Diagnosis not present

## 2020-03-21 DIAGNOSIS — Z79891 Long term (current) use of opiate analgesic: Secondary | ICD-10-CM | POA: Diagnosis not present

## 2020-03-21 DIAGNOSIS — G8252 Quadriplegia, C1-C4 incomplete: Secondary | ICD-10-CM | POA: Diagnosis not present

## 2020-03-21 DIAGNOSIS — M542 Cervicalgia: Secondary | ICD-10-CM | POA: Diagnosis not present

## 2020-03-21 DIAGNOSIS — G8929 Other chronic pain: Secondary | ICD-10-CM | POA: Diagnosis not present

## 2020-03-21 DIAGNOSIS — Z950 Presence of cardiac pacemaker: Secondary | ICD-10-CM | POA: Diagnosis not present

## 2020-03-21 DIAGNOSIS — E039 Hypothyroidism, unspecified: Secondary | ICD-10-CM | POA: Diagnosis not present

## 2020-03-21 DIAGNOSIS — Z87440 Personal history of urinary (tract) infections: Secondary | ICD-10-CM | POA: Diagnosis not present

## 2020-03-21 DIAGNOSIS — Z7401 Bed confinement status: Secondary | ICD-10-CM | POA: Diagnosis not present

## 2020-03-21 DIAGNOSIS — K5904 Chronic idiopathic constipation: Secondary | ICD-10-CM | POA: Diagnosis not present

## 2020-03-21 DIAGNOSIS — G35 Multiple sclerosis: Secondary | ICD-10-CM | POA: Diagnosis not present

## 2020-03-21 DIAGNOSIS — Z9181 History of falling: Secondary | ICD-10-CM | POA: Diagnosis not present

## 2020-03-22 DIAGNOSIS — J019 Acute sinusitis, unspecified: Secondary | ICD-10-CM | POA: Diagnosis not present

## 2020-03-22 DIAGNOSIS — H6123 Impacted cerumen, bilateral: Secondary | ICD-10-CM | POA: Diagnosis not present

## 2020-03-30 DIAGNOSIS — G35 Multiple sclerosis: Secondary | ICD-10-CM | POA: Diagnosis not present

## 2020-03-30 DIAGNOSIS — Z87440 Personal history of urinary (tract) infections: Secondary | ICD-10-CM | POA: Diagnosis not present

## 2020-03-30 DIAGNOSIS — N319 Neuromuscular dysfunction of bladder, unspecified: Secondary | ICD-10-CM | POA: Diagnosis not present

## 2020-03-30 DIAGNOSIS — Z950 Presence of cardiac pacemaker: Secondary | ICD-10-CM | POA: Diagnosis not present

## 2020-03-30 DIAGNOSIS — S14109S Unspecified injury at unspecified level of cervical spinal cord, sequela: Secondary | ICD-10-CM | POA: Diagnosis not present

## 2020-03-30 DIAGNOSIS — Z79890 Hormone replacement therapy (postmenopausal): Secondary | ICD-10-CM | POA: Diagnosis not present

## 2020-03-30 DIAGNOSIS — Z9884 Bariatric surgery status: Secondary | ICD-10-CM | POA: Diagnosis not present

## 2020-03-30 DIAGNOSIS — N3941 Urge incontinence: Secondary | ICD-10-CM | POA: Diagnosis not present

## 2020-03-30 DIAGNOSIS — Z881 Allergy status to other antibiotic agents status: Secondary | ICD-10-CM | POA: Diagnosis not present

## 2020-03-30 DIAGNOSIS — N210 Calculus in bladder: Secondary | ICD-10-CM | POA: Diagnosis not present

## 2020-03-30 DIAGNOSIS — Z888 Allergy status to other drugs, medicaments and biological substances status: Secondary | ICD-10-CM | POA: Diagnosis not present

## 2020-03-30 DIAGNOSIS — Z79899 Other long term (current) drug therapy: Secondary | ICD-10-CM | POA: Diagnosis not present

## 2020-03-30 DIAGNOSIS — Z435 Encounter for attention to cystostomy: Secondary | ICD-10-CM | POA: Diagnosis not present

## 2020-03-30 DIAGNOSIS — I429 Cardiomyopathy, unspecified: Secondary | ICD-10-CM | POA: Diagnosis not present

## 2020-03-30 DIAGNOSIS — G8250 Quadriplegia, unspecified: Secondary | ICD-10-CM | POA: Diagnosis not present

## 2020-03-30 DIAGNOSIS — E039 Hypothyroidism, unspecified: Secondary | ICD-10-CM | POA: Diagnosis not present

## 2020-04-04 DIAGNOSIS — N390 Urinary tract infection, site not specified: Secondary | ICD-10-CM | POA: Diagnosis not present

## 2020-04-04 DIAGNOSIS — R319 Hematuria, unspecified: Secondary | ICD-10-CM | POA: Diagnosis not present

## 2020-04-04 DIAGNOSIS — Z9359 Other cystostomy status: Secondary | ICD-10-CM | POA: Diagnosis not present

## 2020-04-04 DIAGNOSIS — R4182 Altered mental status, unspecified: Secondary | ICD-10-CM | POA: Diagnosis not present

## 2020-04-05 DIAGNOSIS — H6123 Impacted cerumen, bilateral: Secondary | ICD-10-CM | POA: Diagnosis not present

## 2020-04-05 DIAGNOSIS — H9012 Conductive hearing loss, unilateral, left ear, with unrestricted hearing on the contralateral side: Secondary | ICD-10-CM | POA: Diagnosis not present

## 2020-04-06 DIAGNOSIS — E039 Hypothyroidism, unspecified: Secondary | ICD-10-CM | POA: Diagnosis not present

## 2020-04-06 DIAGNOSIS — S14102S Unspecified injury at C2 level of cervical spinal cord, sequela: Secondary | ICD-10-CM | POA: Diagnosis not present

## 2020-04-06 DIAGNOSIS — Z79899 Other long term (current) drug therapy: Secondary | ICD-10-CM | POA: Diagnosis not present

## 2020-04-06 DIAGNOSIS — Z993 Dependence on wheelchair: Secondary | ICD-10-CM | POA: Diagnosis not present

## 2020-04-06 DIAGNOSIS — I10 Essential (primary) hypertension: Secondary | ICD-10-CM | POA: Diagnosis not present

## 2020-04-06 DIAGNOSIS — D510 Vitamin B12 deficiency anemia due to intrinsic factor deficiency: Secondary | ICD-10-CM | POA: Diagnosis not present

## 2020-04-06 DIAGNOSIS — Z9181 History of falling: Secondary | ICD-10-CM | POA: Diagnosis not present

## 2020-04-06 DIAGNOSIS — Z466 Encounter for fitting and adjustment of urinary device: Secondary | ICD-10-CM | POA: Diagnosis not present

## 2020-04-06 DIAGNOSIS — G35 Multiple sclerosis: Secondary | ICD-10-CM | POA: Diagnosis not present

## 2020-04-06 DIAGNOSIS — K5904 Chronic idiopathic constipation: Secondary | ICD-10-CM | POA: Diagnosis not present

## 2020-04-06 DIAGNOSIS — Z950 Presence of cardiac pacemaker: Secondary | ICD-10-CM | POA: Diagnosis not present

## 2020-04-06 DIAGNOSIS — Z87440 Personal history of urinary (tract) infections: Secondary | ICD-10-CM | POA: Diagnosis not present

## 2020-04-06 DIAGNOSIS — M545 Low back pain, unspecified: Secondary | ICD-10-CM | POA: Diagnosis not present

## 2020-04-06 DIAGNOSIS — G8929 Other chronic pain: Secondary | ICD-10-CM | POA: Diagnosis not present

## 2020-04-06 DIAGNOSIS — Z960 Presence of urogenital implants: Secondary | ICD-10-CM | POA: Diagnosis not present

## 2020-04-06 DIAGNOSIS — M542 Cervicalgia: Secondary | ICD-10-CM | POA: Diagnosis not present

## 2020-04-06 DIAGNOSIS — G8252 Quadriplegia, C1-C4 incomplete: Secondary | ICD-10-CM | POA: Diagnosis not present

## 2020-04-19 DIAGNOSIS — Z9359 Other cystostomy status: Secondary | ICD-10-CM | POA: Diagnosis not present

## 2020-04-24 DIAGNOSIS — J324 Chronic pansinusitis: Secondary | ICD-10-CM | POA: Diagnosis not present

## 2020-04-24 DIAGNOSIS — H6983 Other specified disorders of Eustachian tube, bilateral: Secondary | ICD-10-CM | POA: Diagnosis not present

## 2020-05-03 DIAGNOSIS — H6983 Other specified disorders of Eustachian tube, bilateral: Secondary | ICD-10-CM | POA: Diagnosis not present

## 2020-05-16 DIAGNOSIS — M545 Low back pain, unspecified: Secondary | ICD-10-CM | POA: Diagnosis not present

## 2020-05-16 DIAGNOSIS — Z9181 History of falling: Secondary | ICD-10-CM | POA: Diagnosis not present

## 2020-05-16 DIAGNOSIS — Z9359 Other cystostomy status: Secondary | ICD-10-CM | POA: Diagnosis not present

## 2020-05-16 DIAGNOSIS — Z87440 Personal history of urinary (tract) infections: Secondary | ICD-10-CM | POA: Diagnosis not present

## 2020-05-16 DIAGNOSIS — Z466 Encounter for fitting and adjustment of urinary device: Secondary | ICD-10-CM | POA: Diagnosis not present

## 2020-05-16 DIAGNOSIS — G8252 Quadriplegia, C1-C4 incomplete: Secondary | ICD-10-CM | POA: Diagnosis not present

## 2020-05-16 DIAGNOSIS — M542 Cervicalgia: Secondary | ICD-10-CM | POA: Diagnosis not present

## 2020-05-16 DIAGNOSIS — D510 Vitamin B12 deficiency anemia due to intrinsic factor deficiency: Secondary | ICD-10-CM | POA: Diagnosis not present

## 2020-05-16 DIAGNOSIS — Z950 Presence of cardiac pacemaker: Secondary | ICD-10-CM | POA: Diagnosis not present

## 2020-05-16 DIAGNOSIS — Z960 Presence of urogenital implants: Secondary | ICD-10-CM | POA: Diagnosis not present

## 2020-05-16 DIAGNOSIS — Z79899 Other long term (current) drug therapy: Secondary | ICD-10-CM | POA: Diagnosis not present

## 2020-05-16 DIAGNOSIS — Z993 Dependence on wheelchair: Secondary | ICD-10-CM | POA: Diagnosis not present

## 2020-05-16 DIAGNOSIS — S14102S Unspecified injury at C2 level of cervical spinal cord, sequela: Secondary | ICD-10-CM | POA: Diagnosis not present

## 2020-05-16 DIAGNOSIS — K5904 Chronic idiopathic constipation: Secondary | ICD-10-CM | POA: Diagnosis not present

## 2020-05-16 DIAGNOSIS — G8929 Other chronic pain: Secondary | ICD-10-CM | POA: Diagnosis not present

## 2020-05-16 DIAGNOSIS — E039 Hypothyroidism, unspecified: Secondary | ICD-10-CM | POA: Diagnosis not present

## 2020-05-16 DIAGNOSIS — I10 Essential (primary) hypertension: Secondary | ICD-10-CM | POA: Diagnosis not present

## 2020-05-16 DIAGNOSIS — G35 Multiple sclerosis: Secondary | ICD-10-CM | POA: Diagnosis not present

## 2020-05-25 DIAGNOSIS — M542 Cervicalgia: Secondary | ICD-10-CM | POA: Diagnosis not present

## 2020-05-25 DIAGNOSIS — I10 Essential (primary) hypertension: Secondary | ICD-10-CM | POA: Diagnosis not present

## 2020-05-25 DIAGNOSIS — S14102S Unspecified injury at C2 level of cervical spinal cord, sequela: Secondary | ICD-10-CM | POA: Diagnosis not present

## 2020-05-25 DIAGNOSIS — Z993 Dependence on wheelchair: Secondary | ICD-10-CM | POA: Diagnosis not present

## 2020-05-25 DIAGNOSIS — M545 Low back pain, unspecified: Secondary | ICD-10-CM | POA: Diagnosis not present

## 2020-05-25 DIAGNOSIS — Z960 Presence of urogenital implants: Secondary | ICD-10-CM | POA: Diagnosis not present

## 2020-05-25 DIAGNOSIS — D510 Vitamin B12 deficiency anemia due to intrinsic factor deficiency: Secondary | ICD-10-CM | POA: Diagnosis not present

## 2020-05-25 DIAGNOSIS — G8929 Other chronic pain: Secondary | ICD-10-CM | POA: Diagnosis not present

## 2020-05-25 DIAGNOSIS — G35 Multiple sclerosis: Secondary | ICD-10-CM | POA: Diagnosis not present

## 2020-05-25 DIAGNOSIS — E039 Hypothyroidism, unspecified: Secondary | ICD-10-CM | POA: Diagnosis not present

## 2020-05-25 DIAGNOSIS — K5904 Chronic idiopathic constipation: Secondary | ICD-10-CM | POA: Diagnosis not present

## 2020-05-25 DIAGNOSIS — Z9181 History of falling: Secondary | ICD-10-CM | POA: Diagnosis not present

## 2020-05-25 DIAGNOSIS — Z950 Presence of cardiac pacemaker: Secondary | ICD-10-CM | POA: Diagnosis not present

## 2020-05-25 DIAGNOSIS — G8252 Quadriplegia, C1-C4 incomplete: Secondary | ICD-10-CM | POA: Diagnosis not present

## 2020-05-25 DIAGNOSIS — Z87440 Personal history of urinary (tract) infections: Secondary | ICD-10-CM | POA: Diagnosis not present

## 2020-05-25 DIAGNOSIS — Z79899 Other long term (current) drug therapy: Secondary | ICD-10-CM | POA: Diagnosis not present

## 2020-05-25 DIAGNOSIS — Z466 Encounter for fitting and adjustment of urinary device: Secondary | ICD-10-CM | POA: Diagnosis not present

## 2020-05-30 DIAGNOSIS — D510 Vitamin B12 deficiency anemia due to intrinsic factor deficiency: Secondary | ICD-10-CM | POA: Diagnosis not present

## 2020-05-30 DIAGNOSIS — G8252 Quadriplegia, C1-C4 incomplete: Secondary | ICD-10-CM | POA: Diagnosis not present

## 2020-05-30 DIAGNOSIS — G35 Multiple sclerosis: Secondary | ICD-10-CM | POA: Diagnosis not present

## 2020-05-30 DIAGNOSIS — Z960 Presence of urogenital implants: Secondary | ICD-10-CM | POA: Diagnosis not present

## 2020-05-30 DIAGNOSIS — Z950 Presence of cardiac pacemaker: Secondary | ICD-10-CM | POA: Diagnosis not present

## 2020-05-30 DIAGNOSIS — Z87440 Personal history of urinary (tract) infections: Secondary | ICD-10-CM | POA: Diagnosis not present

## 2020-05-30 DIAGNOSIS — M542 Cervicalgia: Secondary | ICD-10-CM | POA: Diagnosis not present

## 2020-05-30 DIAGNOSIS — I10 Essential (primary) hypertension: Secondary | ICD-10-CM | POA: Diagnosis not present

## 2020-05-30 DIAGNOSIS — S14102S Unspecified injury at C2 level of cervical spinal cord, sequela: Secondary | ICD-10-CM | POA: Diagnosis not present

## 2020-05-30 DIAGNOSIS — K5904 Chronic idiopathic constipation: Secondary | ICD-10-CM | POA: Diagnosis not present

## 2020-05-30 DIAGNOSIS — Z9181 History of falling: Secondary | ICD-10-CM | POA: Diagnosis not present

## 2020-05-30 DIAGNOSIS — M545 Low back pain, unspecified: Secondary | ICD-10-CM | POA: Diagnosis not present

## 2020-05-30 DIAGNOSIS — Z993 Dependence on wheelchair: Secondary | ICD-10-CM | POA: Diagnosis not present

## 2020-05-30 DIAGNOSIS — E039 Hypothyroidism, unspecified: Secondary | ICD-10-CM | POA: Diagnosis not present

## 2020-05-30 DIAGNOSIS — Z466 Encounter for fitting and adjustment of urinary device: Secondary | ICD-10-CM | POA: Diagnosis not present

## 2020-05-30 DIAGNOSIS — G8929 Other chronic pain: Secondary | ICD-10-CM | POA: Diagnosis not present

## 2020-05-30 DIAGNOSIS — Z79899 Other long term (current) drug therapy: Secondary | ICD-10-CM | POA: Diagnosis not present

## 2020-05-31 ENCOUNTER — Encounter: Payer: Self-pay | Admitting: Neurology

## 2020-05-31 ENCOUNTER — Ambulatory Visit: Payer: PPO | Admitting: Neurology

## 2020-05-31 VITALS — BP 111/77 | HR 68

## 2020-05-31 DIAGNOSIS — G8250 Quadriplegia, unspecified: Secondary | ICD-10-CM | POA: Diagnosis not present

## 2020-05-31 DIAGNOSIS — H6983 Other specified disorders of Eustachian tube, bilateral: Secondary | ICD-10-CM | POA: Diagnosis not present

## 2020-05-31 MED ORDER — INCOBOTULINUMTOXINA 100 UNITS IM SOLR
600.0000 [IU] | INTRAMUSCULAR | Status: DC
  Administered 2020-05-31: 600 [IU] via INTRAMUSCULAR

## 2020-05-31 NOTE — Progress Notes (Signed)
**  Xeomin 100 units x 6 vials, NDC 1121-6244-69, Lot [id number], Exp 07/2021, office supply.//mck,rn**
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
## 2020-06-02 DIAGNOSIS — N390 Urinary tract infection, site not specified: Secondary | ICD-10-CM | POA: Diagnosis not present

## 2020-06-12 DIAGNOSIS — Z9842 Cataract extraction status, left eye: Secondary | ICD-10-CM | POA: Diagnosis not present

## 2020-06-12 DIAGNOSIS — J329 Chronic sinusitis, unspecified: Secondary | ICD-10-CM | POA: Diagnosis not present

## 2020-06-12 DIAGNOSIS — G8929 Other chronic pain: Secondary | ICD-10-CM | POA: Diagnosis not present

## 2020-06-12 DIAGNOSIS — H6123 Impacted cerumen, bilateral: Secondary | ICD-10-CM | POA: Diagnosis not present

## 2020-06-12 DIAGNOSIS — H9313 Tinnitus, bilateral: Secondary | ICD-10-CM | POA: Diagnosis not present

## 2020-06-12 DIAGNOSIS — Z950 Presence of cardiac pacemaker: Secondary | ICD-10-CM | POA: Diagnosis not present

## 2020-06-12 DIAGNOSIS — M1990 Unspecified osteoarthritis, unspecified site: Secondary | ICD-10-CM | POA: Diagnosis not present

## 2020-06-12 DIAGNOSIS — G4700 Insomnia, unspecified: Secondary | ICD-10-CM | POA: Diagnosis not present

## 2020-06-12 DIAGNOSIS — G35 Multiple sclerosis: Secondary | ICD-10-CM | POA: Diagnosis not present

## 2020-06-12 DIAGNOSIS — I442 Atrioventricular block, complete: Secondary | ICD-10-CM | POA: Diagnosis not present

## 2020-06-12 DIAGNOSIS — H6993 Unspecified Eustachian tube disorder, bilateral: Secondary | ICD-10-CM | POA: Diagnosis not present

## 2020-06-12 DIAGNOSIS — N319 Neuromuscular dysfunction of bladder, unspecified: Secondary | ICD-10-CM | POA: Diagnosis not present

## 2020-06-12 DIAGNOSIS — K5909 Other constipation: Secondary | ICD-10-CM | POA: Diagnosis not present

## 2020-06-12 DIAGNOSIS — R0982 Postnasal drip: Secondary | ICD-10-CM | POA: Diagnosis not present

## 2020-06-12 DIAGNOSIS — I429 Cardiomyopathy, unspecified: Secondary | ICD-10-CM | POA: Diagnosis not present

## 2020-06-12 DIAGNOSIS — Z9359 Other cystostomy status: Secondary | ICD-10-CM | POA: Diagnosis not present

## 2020-06-12 DIAGNOSIS — Z9841 Cataract extraction status, right eye: Secondary | ICD-10-CM | POA: Diagnosis not present

## 2020-06-12 DIAGNOSIS — Z993 Dependence on wheelchair: Secondary | ICD-10-CM | POA: Diagnosis not present

## 2020-06-12 DIAGNOSIS — Z8711 Personal history of peptic ulcer disease: Secondary | ICD-10-CM | POA: Diagnosis not present

## 2020-06-12 DIAGNOSIS — H9012 Conductive hearing loss, unilateral, left ear, with unrestricted hearing on the contralateral side: Secondary | ICD-10-CM | POA: Diagnosis not present

## 2020-06-12 DIAGNOSIS — Z9889 Other specified postprocedural states: Secondary | ICD-10-CM | POA: Diagnosis not present

## 2020-06-12 DIAGNOSIS — H6983 Other specified disorders of Eustachian tube, bilateral: Secondary | ICD-10-CM | POA: Diagnosis not present

## 2020-06-12 DIAGNOSIS — G8250 Quadriplegia, unspecified: Secondary | ICD-10-CM | POA: Diagnosis not present

## 2020-06-12 DIAGNOSIS — R1310 Dysphagia, unspecified: Secondary | ICD-10-CM | POA: Diagnosis not present

## 2020-06-12 DIAGNOSIS — H90A12 Conductive hearing loss, unilateral, left ear with restricted hearing on the contralateral side: Secondary | ICD-10-CM | POA: Diagnosis not present

## 2020-06-12 DIAGNOSIS — E039 Hypothyroidism, unspecified: Secondary | ICD-10-CM | POA: Diagnosis not present

## 2020-06-14 DIAGNOSIS — N390 Urinary tract infection, site not specified: Secondary | ICD-10-CM | POA: Diagnosis not present

## 2020-06-14 DIAGNOSIS — Z9359 Other cystostomy status: Secondary | ICD-10-CM | POA: Diagnosis not present

## 2020-06-14 DIAGNOSIS — E039 Hypothyroidism, unspecified: Secondary | ICD-10-CM | POA: Diagnosis not present

## 2020-06-14 DIAGNOSIS — S14109S Unspecified injury at unspecified level of cervical spinal cord, sequela: Secondary | ICD-10-CM | POA: Diagnosis not present

## 2020-06-14 DIAGNOSIS — G8250 Quadriplegia, unspecified: Secondary | ICD-10-CM | POA: Diagnosis not present

## 2020-06-14 DIAGNOSIS — N319 Neuromuscular dysfunction of bladder, unspecified: Secondary | ICD-10-CM | POA: Diagnosis not present

## 2020-06-30 DIAGNOSIS — S14109S Unspecified injury at unspecified level of cervical spinal cord, sequela: Secondary | ICD-10-CM | POA: Diagnosis not present

## 2020-06-30 DIAGNOSIS — M24522 Contracture, left elbow: Secondary | ICD-10-CM | POA: Diagnosis not present

## 2020-06-30 DIAGNOSIS — M24521 Contracture, right elbow: Secondary | ICD-10-CM | POA: Diagnosis not present

## 2020-06-30 DIAGNOSIS — G8250 Quadriplegia, unspecified: Secondary | ICD-10-CM | POA: Diagnosis not present

## 2020-06-30 DIAGNOSIS — J329 Chronic sinusitis, unspecified: Secondary | ICD-10-CM | POA: Diagnosis not present

## 2020-07-05 DIAGNOSIS — N390 Urinary tract infection, site not specified: Secondary | ICD-10-CM | POA: Diagnosis not present

## 2020-07-07 ENCOUNTER — Other Ambulatory Visit: Payer: Self-pay | Admitting: Neurology

## 2020-07-10 DIAGNOSIS — H6983 Other specified disorders of Eustachian tube, bilateral: Secondary | ICD-10-CM | POA: Diagnosis not present

## 2020-07-10 DIAGNOSIS — Z9622 Myringotomy tube(s) status: Secondary | ICD-10-CM | POA: Diagnosis not present

## 2020-07-17 DIAGNOSIS — S14102S Unspecified injury at C2 level of cervical spinal cord, sequela: Secondary | ICD-10-CM | POA: Diagnosis not present

## 2020-07-17 DIAGNOSIS — M545 Low back pain, unspecified: Secondary | ICD-10-CM | POA: Diagnosis not present

## 2020-07-17 DIAGNOSIS — Z960 Presence of urogenital implants: Secondary | ICD-10-CM | POA: Diagnosis not present

## 2020-07-17 DIAGNOSIS — Z466 Encounter for fitting and adjustment of urinary device: Secondary | ICD-10-CM | POA: Diagnosis not present

## 2020-07-17 DIAGNOSIS — D510 Vitamin B12 deficiency anemia due to intrinsic factor deficiency: Secondary | ICD-10-CM | POA: Diagnosis not present

## 2020-07-17 DIAGNOSIS — G35 Multiple sclerosis: Secondary | ICD-10-CM | POA: Diagnosis not present

## 2020-07-17 DIAGNOSIS — K5904 Chronic idiopathic constipation: Secondary | ICD-10-CM | POA: Diagnosis not present

## 2020-07-17 DIAGNOSIS — I10 Essential (primary) hypertension: Secondary | ICD-10-CM | POA: Diagnosis not present

## 2020-07-17 DIAGNOSIS — Z9181 History of falling: Secondary | ICD-10-CM | POA: Diagnosis not present

## 2020-07-17 DIAGNOSIS — Z950 Presence of cardiac pacemaker: Secondary | ICD-10-CM | POA: Diagnosis not present

## 2020-07-17 DIAGNOSIS — Z87440 Personal history of urinary (tract) infections: Secondary | ICD-10-CM | POA: Diagnosis not present

## 2020-07-17 DIAGNOSIS — Z993 Dependence on wheelchair: Secondary | ICD-10-CM | POA: Diagnosis not present

## 2020-07-17 DIAGNOSIS — G8252 Quadriplegia, C1-C4 incomplete: Secondary | ICD-10-CM | POA: Diagnosis not present

## 2020-07-17 DIAGNOSIS — G8929 Other chronic pain: Secondary | ICD-10-CM | POA: Diagnosis not present

## 2020-07-17 DIAGNOSIS — E039 Hypothyroidism, unspecified: Secondary | ICD-10-CM | POA: Diagnosis not present

## 2020-07-17 DIAGNOSIS — M542 Cervicalgia: Secondary | ICD-10-CM | POA: Diagnosis not present

## 2020-07-17 DIAGNOSIS — Z79899 Other long term (current) drug therapy: Secondary | ICD-10-CM | POA: Diagnosis not present

## 2020-07-18 DIAGNOSIS — N319 Neuromuscular dysfunction of bladder, unspecified: Secondary | ICD-10-CM | POA: Diagnosis not present

## 2020-08-08 DIAGNOSIS — Z9359 Other cystostomy status: Secondary | ICD-10-CM | POA: Diagnosis not present

## 2020-08-11 DIAGNOSIS — G8252 Quadriplegia, C1-C4 incomplete: Secondary | ICD-10-CM | POA: Diagnosis not present

## 2020-08-11 DIAGNOSIS — N390 Urinary tract infection, site not specified: Secondary | ICD-10-CM | POA: Diagnosis not present

## 2020-08-11 DIAGNOSIS — D510 Vitamin B12 deficiency anemia due to intrinsic factor deficiency: Secondary | ICD-10-CM | POA: Diagnosis not present

## 2020-08-11 DIAGNOSIS — Z960 Presence of urogenital implants: Secondary | ICD-10-CM | POA: Diagnosis not present

## 2020-08-11 DIAGNOSIS — Z9181 History of falling: Secondary | ICD-10-CM | POA: Diagnosis not present

## 2020-08-11 DIAGNOSIS — Z79899 Other long term (current) drug therapy: Secondary | ICD-10-CM | POA: Diagnosis not present

## 2020-08-11 DIAGNOSIS — Z950 Presence of cardiac pacemaker: Secondary | ICD-10-CM | POA: Diagnosis not present

## 2020-08-11 DIAGNOSIS — G8929 Other chronic pain: Secondary | ICD-10-CM | POA: Diagnosis not present

## 2020-08-11 DIAGNOSIS — M542 Cervicalgia: Secondary | ICD-10-CM | POA: Diagnosis not present

## 2020-08-11 DIAGNOSIS — I10 Essential (primary) hypertension: Secondary | ICD-10-CM | POA: Diagnosis not present

## 2020-08-11 DIAGNOSIS — Z466 Encounter for fitting and adjustment of urinary device: Secondary | ICD-10-CM | POA: Diagnosis not present

## 2020-08-11 DIAGNOSIS — Z993 Dependence on wheelchair: Secondary | ICD-10-CM | POA: Diagnosis not present

## 2020-08-11 DIAGNOSIS — M545 Low back pain, unspecified: Secondary | ICD-10-CM | POA: Diagnosis not present

## 2020-08-11 DIAGNOSIS — K5904 Chronic idiopathic constipation: Secondary | ICD-10-CM | POA: Diagnosis not present

## 2020-08-11 DIAGNOSIS — S14102S Unspecified injury at C2 level of cervical spinal cord, sequela: Secondary | ICD-10-CM | POA: Diagnosis not present

## 2020-08-11 DIAGNOSIS — G35 Multiple sclerosis: Secondary | ICD-10-CM | POA: Diagnosis not present

## 2020-08-11 DIAGNOSIS — E039 Hypothyroidism, unspecified: Secondary | ICD-10-CM | POA: Diagnosis not present

## 2020-08-14 DIAGNOSIS — M24522 Contracture, left elbow: Secondary | ICD-10-CM | POA: Diagnosis not present

## 2020-08-14 DIAGNOSIS — M24521 Contracture, right elbow: Secondary | ICD-10-CM | POA: Diagnosis not present

## 2020-08-15 DIAGNOSIS — Z993 Dependence on wheelchair: Secondary | ICD-10-CM | POA: Diagnosis not present

## 2020-08-15 DIAGNOSIS — N319 Neuromuscular dysfunction of bladder, unspecified: Secondary | ICD-10-CM | POA: Diagnosis not present

## 2020-08-15 DIAGNOSIS — E538 Deficiency of other specified B group vitamins: Secondary | ICD-10-CM | POA: Diagnosis not present

## 2020-08-15 DIAGNOSIS — I429 Cardiomyopathy, unspecified: Secondary | ICD-10-CM | POA: Diagnosis not present

## 2020-08-15 DIAGNOSIS — E039 Hypothyroidism, unspecified: Secondary | ICD-10-CM | POA: Diagnosis not present

## 2020-08-15 DIAGNOSIS — R197 Diarrhea, unspecified: Secondary | ICD-10-CM | POA: Diagnosis not present

## 2020-08-15 DIAGNOSIS — Z9359 Other cystostomy status: Secondary | ICD-10-CM | POA: Diagnosis not present

## 2020-08-15 DIAGNOSIS — R7989 Other specified abnormal findings of blood chemistry: Secondary | ICD-10-CM | POA: Diagnosis not present

## 2020-08-15 DIAGNOSIS — N390 Urinary tract infection, site not specified: Secondary | ICD-10-CM | POA: Diagnosis not present

## 2020-08-22 DIAGNOSIS — K8689 Other specified diseases of pancreas: Secondary | ICD-10-CM | POA: Diagnosis not present

## 2020-08-22 DIAGNOSIS — K5900 Constipation, unspecified: Secondary | ICD-10-CM | POA: Diagnosis not present

## 2020-08-22 DIAGNOSIS — N210 Calculus in bladder: Secondary | ICD-10-CM | POA: Diagnosis not present

## 2020-08-22 DIAGNOSIS — K838 Other specified diseases of biliary tract: Secondary | ICD-10-CM | POA: Diagnosis not present

## 2020-08-22 DIAGNOSIS — Z960 Presence of urogenital implants: Secondary | ICD-10-CM | POA: Diagnosis not present

## 2020-08-29 DIAGNOSIS — Z9359 Other cystostomy status: Secondary | ICD-10-CM | POA: Diagnosis not present

## 2020-09-06 ENCOUNTER — Other Ambulatory Visit: Payer: Self-pay

## 2020-09-06 ENCOUNTER — Encounter: Payer: Self-pay | Admitting: Neurology

## 2020-09-06 ENCOUNTER — Ambulatory Visit: Payer: PPO | Admitting: Neurology

## 2020-09-06 VITALS — BP 92/67 | HR 67

## 2020-09-06 DIAGNOSIS — G8250 Quadriplegia, unspecified: Secondary | ICD-10-CM | POA: Diagnosis not present

## 2020-09-06 MED ORDER — INCOBOTULINUMTOXINA 100 UNITS IM SOLR
600.0000 [IU] | INTRAMUSCULAR | Status: DC
  Administered 2020-09-06 – 2020-12-13 (×2): 600 [IU] via INTRAMUSCULAR

## 2020-09-06 NOTE — Addendum Note (Signed)
Addended by: Levert Feinstein on: 09/06/2020 04:38 PM   Modules accepted: Orders
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
## 2020-09-06 NOTE — Progress Notes (Signed)
**  Xeomin 100 units x 6 vials, NDC 0259-1610-01, Lot [id number], Exp 03/2022, office supply.//[email or web address]** 

## 2020-09-14 DIAGNOSIS — K5904 Chronic idiopathic constipation: Secondary | ICD-10-CM | POA: Diagnosis not present

## 2020-09-14 DIAGNOSIS — G8252 Quadriplegia, C1-C4 incomplete: Secondary | ICD-10-CM | POA: Diagnosis not present

## 2020-09-14 DIAGNOSIS — G8929 Other chronic pain: Secondary | ICD-10-CM | POA: Diagnosis not present

## 2020-09-14 DIAGNOSIS — Z466 Encounter for fitting and adjustment of urinary device: Secondary | ICD-10-CM | POA: Diagnosis not present

## 2020-09-14 DIAGNOSIS — D510 Vitamin B12 deficiency anemia due to intrinsic factor deficiency: Secondary | ICD-10-CM | POA: Diagnosis not present

## 2020-09-14 DIAGNOSIS — M542 Cervicalgia: Secondary | ICD-10-CM | POA: Diagnosis not present

## 2020-09-14 DIAGNOSIS — E039 Hypothyroidism, unspecified: Secondary | ICD-10-CM | POA: Diagnosis not present

## 2020-09-14 DIAGNOSIS — M545 Low back pain, unspecified: Secondary | ICD-10-CM | POA: Diagnosis not present

## 2020-09-14 DIAGNOSIS — I10 Essential (primary) hypertension: Secondary | ICD-10-CM | POA: Diagnosis not present

## 2020-09-14 DIAGNOSIS — Z79899 Other long term (current) drug therapy: Secondary | ICD-10-CM | POA: Diagnosis not present

## 2020-09-14 DIAGNOSIS — Z9181 History of falling: Secondary | ICD-10-CM | POA: Diagnosis not present

## 2020-09-14 DIAGNOSIS — S14102S Unspecified injury at C2 level of cervical spinal cord, sequela: Secondary | ICD-10-CM | POA: Diagnosis not present

## 2020-09-14 DIAGNOSIS — Z993 Dependence on wheelchair: Secondary | ICD-10-CM | POA: Diagnosis not present

## 2020-09-14 DIAGNOSIS — G35 Multiple sclerosis: Secondary | ICD-10-CM | POA: Diagnosis not present

## 2020-09-14 DIAGNOSIS — Z960 Presence of urogenital implants: Secondary | ICD-10-CM | POA: Diagnosis not present

## 2020-09-14 DIAGNOSIS — Z950 Presence of cardiac pacemaker: Secondary | ICD-10-CM | POA: Diagnosis not present

## 2020-09-14 DIAGNOSIS — N390 Urinary tract infection, site not specified: Secondary | ICD-10-CM | POA: Diagnosis not present

## 2020-09-20 ENCOUNTER — Emergency Department (HOSPITAL_COMMUNITY): Payer: PPO

## 2020-09-20 ENCOUNTER — Emergency Department (HOSPITAL_COMMUNITY)
Admission: EM | Admit: 2020-09-20 | Discharge: 2020-09-20 | Disposition: A | Discharge status: Home / Self Care | Payer: PPO | Attending: Emergency Medicine | Admitting: Emergency Medicine

## 2020-09-20 ENCOUNTER — Other Ambulatory Visit: Payer: Self-pay

## 2020-09-20 DIAGNOSIS — D510 Vitamin B12 deficiency anemia due to intrinsic factor deficiency: Secondary | ICD-10-CM | POA: Diagnosis not present

## 2020-09-20 DIAGNOSIS — K5904 Chronic idiopathic constipation: Secondary | ICD-10-CM | POA: Diagnosis not present

## 2020-09-20 DIAGNOSIS — R410 Disorientation, unspecified: Secondary | ICD-10-CM | POA: Insufficient documentation

## 2020-09-20 DIAGNOSIS — Z79899 Other long term (current) drug therapy: Secondary | ICD-10-CM | POA: Insufficient documentation

## 2020-09-20 DIAGNOSIS — R531 Weakness: Secondary | ICD-10-CM

## 2020-09-20 DIAGNOSIS — Z993 Dependence on wheelchair: Secondary | ICD-10-CM | POA: Diagnosis not present

## 2020-09-20 DIAGNOSIS — Z466 Encounter for fitting and adjustment of urinary device: Secondary | ICD-10-CM | POA: Diagnosis not present

## 2020-09-20 DIAGNOSIS — E860 Dehydration: Secondary | ICD-10-CM

## 2020-09-20 DIAGNOSIS — Z9181 History of falling: Secondary | ICD-10-CM | POA: Diagnosis not present

## 2020-09-20 DIAGNOSIS — Z960 Presence of urogenital implants: Secondary | ICD-10-CM | POA: Diagnosis not present

## 2020-09-20 DIAGNOSIS — M542 Cervicalgia: Secondary | ICD-10-CM | POA: Diagnosis not present

## 2020-09-20 DIAGNOSIS — E039 Hypothyroidism, unspecified: Secondary | ICD-10-CM | POA: Diagnosis not present

## 2020-09-20 DIAGNOSIS — G8929 Other chronic pain: Secondary | ICD-10-CM | POA: Diagnosis not present

## 2020-09-20 DIAGNOSIS — S14102S Unspecified injury at C2 level of cervical spinal cord, sequela: Secondary | ICD-10-CM | POA: Diagnosis not present

## 2020-09-20 DIAGNOSIS — G35 Multiple sclerosis: Secondary | ICD-10-CM | POA: Diagnosis not present

## 2020-09-20 DIAGNOSIS — I10 Essential (primary) hypertension: Secondary | ICD-10-CM | POA: Diagnosis not present

## 2020-09-20 DIAGNOSIS — Z950 Presence of cardiac pacemaker: Secondary | ICD-10-CM | POA: Diagnosis not present

## 2020-09-20 DIAGNOSIS — Z87440 Personal history of urinary (tract) infections: Secondary | ICD-10-CM | POA: Diagnosis not present

## 2020-09-20 DIAGNOSIS — N319 Neuromuscular dysfunction of bladder, unspecified: Secondary | ICD-10-CM | POA: Diagnosis not present

## 2020-09-20 DIAGNOSIS — I429 Cardiomyopathy, unspecified: Secondary | ICD-10-CM | POA: Diagnosis not present

## 2020-09-20 DIAGNOSIS — M545 Low back pain, unspecified: Secondary | ICD-10-CM | POA: Diagnosis not present

## 2020-09-20 DIAGNOSIS — G8252 Quadriplegia, C1-C4 incomplete: Secondary | ICD-10-CM | POA: Diagnosis not present

## 2020-09-20 LAB — URINALYSIS, ROUTINE W REFLEX MICROSCOPIC
Bacteria, UA: NONE SEEN
Bilirubin Urine: NEGATIVE
Glucose, UA: NEGATIVE mg/dL
Ketones, ur: 80 mg/dL — AB
Nitrite: NEGATIVE
Protein, ur: 100 mg/dL — AB
RBC / HPF: >50 RBC/hpf — ABNORMAL HIGH (ref 0–5)
Specific Gravity, Urine: 1.018 (ref 1.005–1.030)
pH: 6 (ref 5.0–8.0)

## 2020-09-20 LAB — COMPREHENSIVE METABOLIC PANEL
ALT: 23 U/L (ref 0–44)
AST: 40 U/L (ref 15–41)
Albumin: 3.5 g/dL (ref 3.5–5.0)
Alkaline Phosphatase: 88 U/L (ref 38–126)
Anion gap: 14 (ref 5–15)
BUN: 15 mg/dL (ref 8–23)
CO2: 25 mmol/L (ref 22–32)
Calcium: 9.6 mg/dL (ref 8.9–10.3)
Chloride: 102 mmol/L (ref 98–111)
Creatinine, Ser: 0.56 mg/dL (ref 0.44–1.00)
GFR, Estimated: >60 mL/min (ref >60)
Glucose, Bld: 85 mg/dL (ref 70–99)
Potassium: 5.2 mmol/L — ABNORMAL HIGH (ref 3.5–5.1)
Sodium: 141 mmol/L (ref 135–145)
Total Bilirubin: 1.1 mg/dL (ref 0.3–1.2)
Total Protein: 6.3 g/dL — ABNORMAL LOW (ref 6.5–8.1)

## 2020-09-20 LAB — CBC
HCT: 45.7 % (ref 36.0–46.0)
Hemoglobin: 14 g/dL (ref 12.0–15.0)
MCH: 29.2 pg (ref 26.0–34.0)
MCHC: 30.6 g/dL (ref 30.0–36.0)
MCV: 95.4 fL (ref 80.0–100.0)
Platelets: 122 10*3/uL — ABNORMAL LOW (ref 150–400)
RBC: 4.79 MIL/uL (ref 3.87–5.11)
RDW: 13.4 % (ref 11.5–15.5)
WBC: 4.4 10*3/uL (ref 4.0–10.5)
nRBC: 0 % (ref 0.0–0.2)

## 2020-09-20 LAB — LIPASE, BLOOD: Lipase: 25 U/L (ref 11–51)

## 2020-09-20 LAB — ETHANOL: Alcohol, Ethyl (B): <10 mg/dL (ref <10)

## 2020-09-20 LAB — TSH: TSH: 0.934 u[IU]/mL (ref 0.350–4.500)

## 2020-09-20 LAB — BRAIN NATRIURETIC PEPTIDE: B Natriuretic Peptide: 56.5 pg/mL (ref 0.0–100.0)

## 2020-09-20 LAB — T4, FREE: Free T4: 1.2 ng/dL — ABNORMAL HIGH (ref 0.61–1.12)

## 2020-09-20 MED ORDER — SODIUM CHLORIDE 0.9 % IV BOLUS
1000.0000 mL | Freq: Once | INTRAVENOUS | Status: AC
  Administered 2020-09-20: 1000 mL via INTRAVENOUS

## 2020-09-20 MED ORDER — SODIUM CHLORIDE 0.9 % IV BOLUS
500.0000 mL | Freq: Once | INTRAVENOUS | Status: AC
  Administered 2020-09-20: 500 mL via INTRAVENOUS

## 2020-09-20 NOTE — ED Triage Notes (Signed)
Pt is quadriplegic, arrives with daughter for continued blood in urine, weakness, confusion, and more sleepiness over the last two days and large mucous plug in her mouth this morning. Has suprapubic catheter, due to be changed today. Daughter reports chronic UTI. Normally seen at [facility] but daughter is not satisfied with care received there.

## 2020-09-20 NOTE — Discharge Instructions (Addendum)
As discussed, today's evaluation has been generally reassuring.  There is evidence for ongoing dehydration, but no evidence for infection, pneumonia, or electrolyte abnormalities.  Please follow-up with your physician as well as with one of our infectious disease specialists.  A urine culture has been conducted, and he will be made aware of abnormal findings.

## 2020-09-20 NOTE — ED Notes (Signed)
Provider at bedside
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
## 2020-09-20 NOTE — ED Notes (Signed)
Help get patient into a gown on the monitor patient is resting with call bell in reach and family at bedside 

## 2020-09-21 LAB — URINE CULTURE: Culture: NO GROWTH

## 2020-09-25 DIAGNOSIS — N319 Neuromuscular dysfunction of bladder, unspecified: Secondary | ICD-10-CM | POA: Diagnosis not present

## 2020-10-13 DIAGNOSIS — Z466 Encounter for fitting and adjustment of urinary device: Secondary | ICD-10-CM | POA: Diagnosis not present

## 2020-11-02 DIAGNOSIS — Z950 Presence of cardiac pacemaker: Secondary | ICD-10-CM | POA: Diagnosis not present

## 2020-11-09 DIAGNOSIS — D510 Vitamin B12 deficiency anemia due to intrinsic factor deficiency: Secondary | ICD-10-CM | POA: Diagnosis not present

## 2020-11-09 DIAGNOSIS — Z79899 Other long term (current) drug therapy: Secondary | ICD-10-CM | POA: Diagnosis not present

## 2020-11-09 DIAGNOSIS — E039 Hypothyroidism, unspecified: Secondary | ICD-10-CM | POA: Diagnosis not present

## 2020-11-09 DIAGNOSIS — G8929 Other chronic pain: Secondary | ICD-10-CM | POA: Diagnosis not present

## 2020-11-09 DIAGNOSIS — Z950 Presence of cardiac pacemaker: Secondary | ICD-10-CM | POA: Diagnosis not present

## 2020-11-09 DIAGNOSIS — I429 Cardiomyopathy, unspecified: Secondary | ICD-10-CM | POA: Diagnosis not present

## 2020-11-09 DIAGNOSIS — Z993 Dependence on wheelchair: Secondary | ICD-10-CM | POA: Diagnosis not present

## 2020-11-09 DIAGNOSIS — Z9181 History of falling: Secondary | ICD-10-CM | POA: Diagnosis not present

## 2020-11-09 DIAGNOSIS — Z466 Encounter for fitting and adjustment of urinary device: Secondary | ICD-10-CM | POA: Diagnosis not present

## 2020-11-09 DIAGNOSIS — M545 Low back pain, unspecified: Secondary | ICD-10-CM | POA: Diagnosis not present

## 2020-11-09 DIAGNOSIS — Z960 Presence of urogenital implants: Secondary | ICD-10-CM | POA: Diagnosis not present

## 2020-11-09 DIAGNOSIS — M542 Cervicalgia: Secondary | ICD-10-CM | POA: Diagnosis not present

## 2020-11-09 DIAGNOSIS — N319 Neuromuscular dysfunction of bladder, unspecified: Secondary | ICD-10-CM | POA: Diagnosis not present

## 2020-11-09 DIAGNOSIS — S14102S Unspecified injury at C2 level of cervical spinal cord, sequela: Secondary | ICD-10-CM | POA: Diagnosis not present

## 2020-11-09 DIAGNOSIS — G8252 Quadriplegia, C1-C4 incomplete: Secondary | ICD-10-CM | POA: Diagnosis not present

## 2020-11-09 DIAGNOSIS — G35 Multiple sclerosis: Secondary | ICD-10-CM | POA: Diagnosis not present

## 2020-11-09 DIAGNOSIS — Z87440 Personal history of urinary (tract) infections: Secondary | ICD-10-CM | POA: Diagnosis not present

## 2020-11-09 DIAGNOSIS — I10 Essential (primary) hypertension: Secondary | ICD-10-CM | POA: Diagnosis not present

## 2020-11-09 DIAGNOSIS — K5904 Chronic idiopathic constipation: Secondary | ICD-10-CM | POA: Diagnosis not present

## 2020-11-14 DIAGNOSIS — Z9359 Other cystostomy status: Secondary | ICD-10-CM | POA: Diagnosis not present

## 2020-11-15 DIAGNOSIS — S14109S Unspecified injury at unspecified level of cervical spinal cord, sequela: Secondary | ICD-10-CM | POA: Diagnosis not present

## 2020-11-15 DIAGNOSIS — E538 Deficiency of other specified B group vitamins: Secondary | ICD-10-CM | POA: Diagnosis not present

## 2020-11-15 DIAGNOSIS — I495 Sick sinus syndrome: Secondary | ICD-10-CM | POA: Diagnosis not present

## 2020-11-15 DIAGNOSIS — E559 Vitamin D deficiency, unspecified: Secondary | ICD-10-CM | POA: Diagnosis not present

## 2020-11-15 DIAGNOSIS — R64 Cachexia: Secondary | ICD-10-CM | POA: Diagnosis not present

## 2020-11-15 DIAGNOSIS — G35 Multiple sclerosis: Secondary | ICD-10-CM | POA: Diagnosis not present

## 2020-11-15 DIAGNOSIS — G8250 Quadriplegia, unspecified: Secondary | ICD-10-CM | POA: Diagnosis not present

## 2020-11-15 DIAGNOSIS — N319 Neuromuscular dysfunction of bladder, unspecified: Secondary | ICD-10-CM | POA: Diagnosis not present

## 2020-11-15 DIAGNOSIS — E039 Hypothyroidism, unspecified: Secondary | ICD-10-CM | POA: Diagnosis not present

## 2020-11-15 DIAGNOSIS — I429 Cardiomyopathy, unspecified: Secondary | ICD-10-CM | POA: Diagnosis not present

## 2020-11-15 DIAGNOSIS — Z9359 Other cystostomy status: Secondary | ICD-10-CM | POA: Diagnosis not present

## 2020-11-15 DIAGNOSIS — N390 Urinary tract infection, site not specified: Secondary | ICD-10-CM | POA: Diagnosis not present

## 2020-11-16 DIAGNOSIS — I429 Cardiomyopathy, unspecified: Secondary | ICD-10-CM | POA: Diagnosis not present

## 2020-11-16 DIAGNOSIS — Z993 Dependence on wheelchair: Secondary | ICD-10-CM | POA: Diagnosis not present

## 2020-11-16 DIAGNOSIS — G35 Multiple sclerosis: Secondary | ICD-10-CM | POA: Diagnosis not present

## 2020-11-16 DIAGNOSIS — I10 Essential (primary) hypertension: Secondary | ICD-10-CM | POA: Diagnosis not present

## 2020-11-16 DIAGNOSIS — M542 Cervicalgia: Secondary | ICD-10-CM | POA: Diagnosis not present

## 2020-11-16 DIAGNOSIS — D510 Vitamin B12 deficiency anemia due to intrinsic factor deficiency: Secondary | ICD-10-CM | POA: Diagnosis not present

## 2020-11-16 DIAGNOSIS — Z87440 Personal history of urinary (tract) infections: Secondary | ICD-10-CM | POA: Diagnosis not present

## 2020-11-16 DIAGNOSIS — K5904 Chronic idiopathic constipation: Secondary | ICD-10-CM | POA: Diagnosis not present

## 2020-11-16 DIAGNOSIS — G8252 Quadriplegia, C1-C4 incomplete: Secondary | ICD-10-CM | POA: Diagnosis not present

## 2020-11-16 DIAGNOSIS — Z950 Presence of cardiac pacemaker: Secondary | ICD-10-CM | POA: Diagnosis not present

## 2020-11-16 DIAGNOSIS — M545 Low back pain, unspecified: Secondary | ICD-10-CM | POA: Diagnosis not present

## 2020-11-16 DIAGNOSIS — Z466 Encounter for fitting and adjustment of urinary device: Secondary | ICD-10-CM | POA: Diagnosis not present

## 2020-11-16 DIAGNOSIS — Z9181 History of falling: Secondary | ICD-10-CM | POA: Diagnosis not present

## 2020-11-16 DIAGNOSIS — Z960 Presence of urogenital implants: Secondary | ICD-10-CM | POA: Diagnosis not present

## 2020-11-16 DIAGNOSIS — N319 Neuromuscular dysfunction of bladder, unspecified: Secondary | ICD-10-CM | POA: Diagnosis not present

## 2020-11-16 DIAGNOSIS — E039 Hypothyroidism, unspecified: Secondary | ICD-10-CM | POA: Diagnosis not present

## 2020-11-16 DIAGNOSIS — G8929 Other chronic pain: Secondary | ICD-10-CM | POA: Diagnosis not present

## 2020-11-16 DIAGNOSIS — S14102S Unspecified injury at C2 level of cervical spinal cord, sequela: Secondary | ICD-10-CM | POA: Diagnosis not present

## 2020-11-16 DIAGNOSIS — Z79899 Other long term (current) drug therapy: Secondary | ICD-10-CM | POA: Diagnosis not present

## 2020-11-17 DIAGNOSIS — I442 Atrioventricular block, complete: Secondary | ICD-10-CM | POA: Diagnosis not present

## 2020-11-17 DIAGNOSIS — S14109S Unspecified injury at unspecified level of cervical spinal cord, sequela: Secondary | ICD-10-CM | POA: Diagnosis not present

## 2020-11-17 DIAGNOSIS — I429 Cardiomyopathy, unspecified: Secondary | ICD-10-CM | POA: Diagnosis not present

## 2020-11-17 DIAGNOSIS — G8250 Quadriplegia, unspecified: Secondary | ICD-10-CM | POA: Diagnosis not present

## 2020-11-17 DIAGNOSIS — G35 Multiple sclerosis: Secondary | ICD-10-CM | POA: Diagnosis not present

## 2020-11-17 DIAGNOSIS — Z9359 Other cystostomy status: Secondary | ICD-10-CM | POA: Diagnosis not present

## 2020-11-28 DIAGNOSIS — M24572 Contracture, left ankle: Secondary | ICD-10-CM | POA: Diagnosis not present

## 2020-11-28 DIAGNOSIS — M24571 Contracture, right ankle: Secondary | ICD-10-CM | POA: Diagnosis not present

## 2020-12-05 DIAGNOSIS — Z9359 Other cystostomy status: Secondary | ICD-10-CM | POA: Diagnosis not present

## 2020-12-06 DIAGNOSIS — Z950 Presence of cardiac pacemaker: Secondary | ICD-10-CM | POA: Diagnosis not present

## 2020-12-06 DIAGNOSIS — I10 Essential (primary) hypertension: Secondary | ICD-10-CM | POA: Diagnosis not present

## 2020-12-06 DIAGNOSIS — G8252 Quadriplegia, C1-C4 incomplete: Secondary | ICD-10-CM | POA: Diagnosis not present

## 2020-12-06 DIAGNOSIS — M542 Cervicalgia: Secondary | ICD-10-CM | POA: Diagnosis not present

## 2020-12-06 DIAGNOSIS — Z87440 Personal history of urinary (tract) infections: Secondary | ICD-10-CM | POA: Diagnosis not present

## 2020-12-06 DIAGNOSIS — K5904 Chronic idiopathic constipation: Secondary | ICD-10-CM | POA: Diagnosis not present

## 2020-12-06 DIAGNOSIS — N319 Neuromuscular dysfunction of bladder, unspecified: Secondary | ICD-10-CM | POA: Diagnosis not present

## 2020-12-06 DIAGNOSIS — M545 Low back pain, unspecified: Secondary | ICD-10-CM | POA: Diagnosis not present

## 2020-12-06 DIAGNOSIS — D510 Vitamin B12 deficiency anemia due to intrinsic factor deficiency: Secondary | ICD-10-CM | POA: Diagnosis not present

## 2020-12-06 DIAGNOSIS — Z960 Presence of urogenital implants: Secondary | ICD-10-CM | POA: Diagnosis not present

## 2020-12-06 DIAGNOSIS — S14102S Unspecified injury at C2 level of cervical spinal cord, sequela: Secondary | ICD-10-CM | POA: Diagnosis not present

## 2020-12-06 DIAGNOSIS — I429 Cardiomyopathy, unspecified: Secondary | ICD-10-CM | POA: Diagnosis not present

## 2020-12-06 DIAGNOSIS — E039 Hypothyroidism, unspecified: Secondary | ICD-10-CM | POA: Diagnosis not present

## 2020-12-06 DIAGNOSIS — G8929 Other chronic pain: Secondary | ICD-10-CM | POA: Diagnosis not present

## 2020-12-06 DIAGNOSIS — Z79899 Other long term (current) drug therapy: Secondary | ICD-10-CM | POA: Diagnosis not present

## 2020-12-06 DIAGNOSIS — G35 Multiple sclerosis: Secondary | ICD-10-CM | POA: Diagnosis not present

## 2020-12-06 DIAGNOSIS — Z993 Dependence on wheelchair: Secondary | ICD-10-CM | POA: Diagnosis not present

## 2020-12-13 ENCOUNTER — Encounter: Payer: Self-pay | Admitting: Neurology

## 2020-12-13 ENCOUNTER — Ambulatory Visit (INDEPENDENT_AMBULATORY_CARE_PROVIDER_SITE_OTHER): Payer: PPO | Admitting: Neurology

## 2020-12-13 VITALS — BP 90/60 | HR 68

## 2020-12-13 DIAGNOSIS — G8250 Quadriplegia, unspecified: Secondary | ICD-10-CM

## 2020-12-13 NOTE — Progress Notes (Signed)
**  Xeomin 100 units x 6 vials, NDC 0259-1610-01, Lot [id number], Exp 06/2022, office supply.//mck,rn** 

## 2020-12-19 ENCOUNTER — Telehealth: Payer: Self-pay | Admitting: Neurology

## 2020-12-19 MED ORDER — INCOBOTULINUMTOXINA 100 UNITS IM SOLR: 600.0000 [IU] | INTRAMUSCULAR | Status: DC

## 2020-12-19 NOTE — Telephone Encounter (Signed)
Noted  

## 2020-12-19 NOTE — Telephone Encounter (Signed)
Please change xeomin to 300 units at next injection
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
## 2020-12-26 DIAGNOSIS — Z9359 Other cystostomy status: Secondary | ICD-10-CM | POA: Diagnosis not present

## 2021-01-02 DIAGNOSIS — I429 Cardiomyopathy, unspecified: Secondary | ICD-10-CM | POA: Diagnosis not present

## 2021-01-02 DIAGNOSIS — Z950 Presence of cardiac pacemaker: Secondary | ICD-10-CM | POA: Diagnosis not present

## 2021-01-02 DIAGNOSIS — I442 Atrioventricular block, complete: Secondary | ICD-10-CM | POA: Diagnosis not present

## 2021-01-02 DIAGNOSIS — I471 Supraventricular tachycardia, unspecified: Secondary | ICD-10-CM | POA: Insufficient documentation

## 2021-01-04 DIAGNOSIS — S14102S Unspecified injury at C2 level of cervical spinal cord, sequela: Secondary | ICD-10-CM | POA: Diagnosis not present

## 2021-01-04 DIAGNOSIS — G35 Multiple sclerosis: Secondary | ICD-10-CM | POA: Diagnosis not present

## 2021-01-04 DIAGNOSIS — Z960 Presence of urogenital implants: Secondary | ICD-10-CM | POA: Diagnosis not present

## 2021-01-04 DIAGNOSIS — E039 Hypothyroidism, unspecified: Secondary | ICD-10-CM | POA: Diagnosis not present

## 2021-01-04 DIAGNOSIS — G8252 Quadriplegia, C1-C4 incomplete: Secondary | ICD-10-CM | POA: Diagnosis not present

## 2021-01-04 DIAGNOSIS — Z993 Dependence on wheelchair: Secondary | ICD-10-CM | POA: Diagnosis not present

## 2021-01-04 DIAGNOSIS — Z950 Presence of cardiac pacemaker: Secondary | ICD-10-CM | POA: Diagnosis not present

## 2021-01-04 DIAGNOSIS — M545 Low back pain, unspecified: Secondary | ICD-10-CM | POA: Diagnosis not present

## 2021-01-04 DIAGNOSIS — Z79899 Other long term (current) drug therapy: Secondary | ICD-10-CM | POA: Diagnosis not present

## 2021-01-04 DIAGNOSIS — I10 Essential (primary) hypertension: Secondary | ICD-10-CM | POA: Diagnosis not present

## 2021-01-04 DIAGNOSIS — G8929 Other chronic pain: Secondary | ICD-10-CM | POA: Diagnosis not present

## 2021-01-04 DIAGNOSIS — K5904 Chronic idiopathic constipation: Secondary | ICD-10-CM | POA: Diagnosis not present

## 2021-01-04 DIAGNOSIS — N319 Neuromuscular dysfunction of bladder, unspecified: Secondary | ICD-10-CM | POA: Diagnosis not present

## 2021-01-04 DIAGNOSIS — I429 Cardiomyopathy, unspecified: Secondary | ICD-10-CM | POA: Diagnosis not present

## 2021-01-04 DIAGNOSIS — M542 Cervicalgia: Secondary | ICD-10-CM | POA: Diagnosis not present

## 2021-01-04 DIAGNOSIS — D510 Vitamin B12 deficiency anemia due to intrinsic factor deficiency: Secondary | ICD-10-CM | POA: Diagnosis not present

## 2021-01-04 DIAGNOSIS — Z87440 Personal history of urinary (tract) infections: Secondary | ICD-10-CM | POA: Diagnosis not present

## 2021-01-10 DIAGNOSIS — H6123 Impacted cerumen, bilateral: Secondary | ICD-10-CM | POA: Diagnosis not present

## 2021-01-10 DIAGNOSIS — R519 Headache, unspecified: Secondary | ICD-10-CM | POA: Diagnosis not present

## 2021-01-16 DIAGNOSIS — Z9359 Other cystostomy status: Secondary | ICD-10-CM | POA: Diagnosis not present

## 2021-01-17 ENCOUNTER — Emergency Department (HOSPITAL_BASED_OUTPATIENT_CLINIC_OR_DEPARTMENT_OTHER): Payer: PPO | Admitting: Radiology

## 2021-01-17 ENCOUNTER — Emergency Department (HOSPITAL_BASED_OUTPATIENT_CLINIC_OR_DEPARTMENT_OTHER)
Admission: EM | Admit: 2021-01-17 | Discharge: 2021-01-18 | Disposition: A | Discharge status: Home / Self Care | Payer: PPO | Attending: Emergency Medicine | Admitting: Emergency Medicine

## 2021-01-17 ENCOUNTER — Other Ambulatory Visit: Payer: Self-pay

## 2021-01-17 ENCOUNTER — Encounter (HOSPITAL_BASED_OUTPATIENT_CLINIC_OR_DEPARTMENT_OTHER): Payer: Self-pay

## 2021-01-17 DIAGNOSIS — R059 Cough, unspecified: Secondary | ICD-10-CM | POA: Insufficient documentation

## 2021-01-17 DIAGNOSIS — R6883 Chills (without fever): Secondary | ICD-10-CM | POA: Diagnosis not present

## 2021-01-17 DIAGNOSIS — N390 Urinary tract infection, site not specified: Secondary | ICD-10-CM | POA: Insufficient documentation

## 2021-01-17 DIAGNOSIS — Z79899 Other long term (current) drug therapy: Secondary | ICD-10-CM | POA: Insufficient documentation

## 2021-01-17 DIAGNOSIS — R0981 Nasal congestion: Secondary | ICD-10-CM | POA: Insufficient documentation

## 2021-01-17 DIAGNOSIS — Z87440 Personal history of urinary (tract) infections: Secondary | ICD-10-CM | POA: Diagnosis not present

## 2021-01-17 DIAGNOSIS — T83510A Infection and inflammatory reaction due to cystostomy catheter, initial encounter: Secondary | ICD-10-CM | POA: Diagnosis not present

## 2021-01-17 DIAGNOSIS — R5383 Other fatigue: Secondary | ICD-10-CM | POA: Diagnosis not present

## 2021-01-17 DIAGNOSIS — E039 Hypothyroidism, unspecified: Secondary | ICD-10-CM | POA: Insufficient documentation

## 2021-01-17 DIAGNOSIS — E860 Dehydration: Secondary | ICD-10-CM | POA: Diagnosis present

## 2021-01-17 DIAGNOSIS — R058 Other specified cough: Secondary | ICD-10-CM | POA: Diagnosis not present

## 2021-01-17 DIAGNOSIS — R197 Diarrhea, unspecified: Secondary | ICD-10-CM | POA: Diagnosis not present

## 2021-01-17 LAB — URINALYSIS, ROUTINE W REFLEX MICROSCOPIC
Bilirubin Urine: NEGATIVE
Glucose, UA: NEGATIVE mg/dL
Hgb urine dipstick: NEGATIVE
Ketones, ur: NEGATIVE mg/dL
Nitrite: POSITIVE — AB
Specific Gravity, Urine: 1.015 (ref 1.005–1.030)
WBC, UA: >50 WBC/hpf — ABNORMAL HIGH (ref 0–5)
pH: 5.5 (ref 5.0–8.0)

## 2021-01-17 LAB — COMPREHENSIVE METABOLIC PANEL
ALT: 22 U/L (ref 0–44)
AST: 13 U/L — ABNORMAL LOW (ref 15–41)
Albumin: 3.5 g/dL (ref 3.5–5.0)
Alkaline Phosphatase: 100 U/L (ref 38–126)
Anion gap: 8 (ref 5–15)
BUN: 24 mg/dL — ABNORMAL HIGH (ref 8–23)
CO2: 30 mmol/L (ref 22–32)
Calcium: 10 mg/dL (ref 8.9–10.3)
Chloride: 104 mmol/L (ref 98–111)
Creatinine, Ser: 0.42 mg/dL — ABNORMAL LOW (ref 0.44–1.00)
GFR, Estimated: >60 mL/min (ref >60)
Glucose, Bld: 135 mg/dL — ABNORMAL HIGH (ref 70–99)
Potassium: 4.5 mmol/L (ref 3.5–5.1)
Sodium: 142 mmol/L (ref 135–145)
Total Bilirubin: 0.6 mg/dL (ref 0.3–1.2)
Total Protein: 6.5 g/dL (ref 6.5–8.1)

## 2021-01-17 LAB — CBC
HCT: 45.6 % (ref 36.0–46.0)
Hemoglobin: 14.5 g/dL (ref 12.0–15.0)
MCH: 29.7 pg (ref 26.0–34.0)
MCHC: 31.8 g/dL (ref 30.0–36.0)
MCV: 93.3 fL (ref 80.0–100.0)
Platelets: 170 10*3/uL (ref 150–400)
RBC: 4.89 MIL/uL (ref 3.87–5.11)
RDW: 13.7 % (ref 11.5–15.5)
WBC: 13.1 10*3/uL — ABNORMAL HIGH (ref 4.0–10.5)
nRBC: 0 % (ref 0.0–0.2)

## 2021-01-17 MED ORDER — FOSFOMYCIN TROMETHAMINE 3 G PO PACK: PACK | ORAL | 0 refills | Status: DC

## 2021-01-17 MED ORDER — LACTATED RINGERS IV BOLUS
1000.0000 mL | Freq: Once | INTRAVENOUS | Status: AC
  Administered 2021-01-17: 1000 mL via INTRAVENOUS

## 2021-01-17 NOTE — Telephone Encounter (Signed)
Clarified with insurance Per [name], it is okay for her to stay on 600 units of Xeomin injection

## 2021-01-17 NOTE — ED Notes (Signed)
ED Provider at bedside. 
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
## 2021-01-17 NOTE — ED Notes (Signed)
Patient prefers to stay in Personal Wheelchair at this Time. Patient made aware that Staff will be very willing to assist to move Patient in Stretcher if needed.

## 2021-01-17 NOTE — ED Notes (Addendum)
RT placed PIV right forearm w/out difficulty. PIV secured, flushed, saline locked. Blood collection for labs performed at this time.
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
## 2021-01-21 LAB — URINE CULTURE: Culture: >=100000 — AB

## 2021-01-22 ENCOUNTER — Telehealth: Payer: Self-pay

## 2021-01-22 NOTE — Telephone Encounter (Signed)
Post ED Visit - Positive Culture Follow-up  Culture report reviewed by antimicrobial stewardship pharmacist: [facility] [x]  , Pharm.D. []  Loleta Dicker, Pharm.D., BCPS AQ-ID []  , Pharm.D., BCPS []  Celedonio Miyamoto, Pharm.D., BCPS []  [place], Garvin Fila.D., BCPS, AAHIVP []  , Pharm.D., BCPS, AAHIVP []  Georgina Pillion, PharmD, BCPS []  , PharmD, BCPS []  [place], PharmD, BCPS []  [address], PharmD []  , PharmD, BCPS []  Estella Husk, PharmD  Pharmacy Team []  Lysle Pearl, PharmD []  , PharmD []  Phillips Climes, PharmD []  , Rph []  Agapito Games) , PharmD []  Verlan Friends, PharmD []  , PharmD []  Mervyn Gay, PharmD []  , PharmD []  Vinnie Level, PharmD []  [facility], PharmD []  , PharmD []  Len Childs, PharmD   Positive urine culture Treated with Fosfomycin Tromethamine, organism sensitive to the same and no further patient follow-up is required at this time.  01/22/2021, 11:41 AM

## 2021-01-31 DIAGNOSIS — M545 Low back pain, unspecified: Secondary | ICD-10-CM | POA: Diagnosis not present

## 2021-01-31 DIAGNOSIS — Z87440 Personal history of urinary (tract) infections: Secondary | ICD-10-CM | POA: Diagnosis not present

## 2021-01-31 DIAGNOSIS — E039 Hypothyroidism, unspecified: Secondary | ICD-10-CM | POA: Diagnosis not present

## 2021-01-31 DIAGNOSIS — I429 Cardiomyopathy, unspecified: Secondary | ICD-10-CM | POA: Diagnosis not present

## 2021-01-31 DIAGNOSIS — S14102S Unspecified injury at C2 level of cervical spinal cord, sequela: Secondary | ICD-10-CM | POA: Diagnosis not present

## 2021-01-31 DIAGNOSIS — Z79899 Other long term (current) drug therapy: Secondary | ICD-10-CM | POA: Diagnosis not present

## 2021-01-31 DIAGNOSIS — I10 Essential (primary) hypertension: Secondary | ICD-10-CM | POA: Diagnosis not present

## 2021-01-31 DIAGNOSIS — G8252 Quadriplegia, C1-C4 incomplete: Secondary | ICD-10-CM | POA: Diagnosis not present

## 2021-01-31 DIAGNOSIS — G8929 Other chronic pain: Secondary | ICD-10-CM | POA: Diagnosis not present

## 2021-01-31 DIAGNOSIS — Z466 Encounter for fitting and adjustment of urinary device: Secondary | ICD-10-CM | POA: Diagnosis not present

## 2021-01-31 DIAGNOSIS — Z9181 History of falling: Secondary | ICD-10-CM | POA: Diagnosis not present

## 2021-01-31 DIAGNOSIS — Z950 Presence of cardiac pacemaker: Secondary | ICD-10-CM | POA: Diagnosis not present

## 2021-01-31 DIAGNOSIS — Z960 Presence of urogenital implants: Secondary | ICD-10-CM | POA: Diagnosis not present

## 2021-01-31 DIAGNOSIS — D510 Vitamin B12 deficiency anemia due to intrinsic factor deficiency: Secondary | ICD-10-CM | POA: Diagnosis not present

## 2021-01-31 DIAGNOSIS — M542 Cervicalgia: Secondary | ICD-10-CM | POA: Diagnosis not present

## 2021-01-31 DIAGNOSIS — G35 Multiple sclerosis: Secondary | ICD-10-CM | POA: Diagnosis not present

## 2021-01-31 DIAGNOSIS — N319 Neuromuscular dysfunction of bladder, unspecified: Secondary | ICD-10-CM | POA: Diagnosis not present

## 2021-01-31 DIAGNOSIS — K5904 Chronic idiopathic constipation: Secondary | ICD-10-CM | POA: Diagnosis not present

## 2021-01-31 DIAGNOSIS — Z993 Dependence on wheelchair: Secondary | ICD-10-CM | POA: Diagnosis not present

## 2021-02-01 DIAGNOSIS — Z950 Presence of cardiac pacemaker: Secondary | ICD-10-CM | POA: Diagnosis not present

## 2021-02-01 DIAGNOSIS — I442 Atrioventricular block, complete: Secondary | ICD-10-CM | POA: Diagnosis not present

## 2021-02-01 DIAGNOSIS — I429 Cardiomyopathy, unspecified: Secondary | ICD-10-CM | POA: Diagnosis not present

## 2021-02-01 DIAGNOSIS — I471 Supraventricular tachycardia: Secondary | ICD-10-CM | POA: Diagnosis not present

## 2021-02-06 DIAGNOSIS — Z9359 Other cystostomy status: Secondary | ICD-10-CM | POA: Diagnosis not present

## 2021-02-12 DIAGNOSIS — I429 Cardiomyopathy, unspecified: Secondary | ICD-10-CM | POA: Diagnosis not present

## 2021-02-12 DIAGNOSIS — Z950 Presence of cardiac pacemaker: Secondary | ICD-10-CM | POA: Diagnosis not present

## 2021-02-12 DIAGNOSIS — I442 Atrioventricular block, complete: Secondary | ICD-10-CM | POA: Diagnosis not present

## 2021-02-12 DIAGNOSIS — I471 Supraventricular tachycardia: Secondary | ICD-10-CM | POA: Diagnosis not present

## 2021-02-15 ENCOUNTER — Other Ambulatory Visit: Payer: Self-pay

## 2021-02-15 ENCOUNTER — Ambulatory Visit: Payer: PPO | Attending: Nurse Practitioner

## 2021-02-15 ENCOUNTER — Telehealth: Payer: Self-pay

## 2021-02-15 DIAGNOSIS — R2689 Other abnormalities of gait and mobility: Secondary | ICD-10-CM | POA: Insufficient documentation

## 2021-02-15 DIAGNOSIS — M6281 Muscle weakness (generalized): Secondary | ICD-10-CM | POA: Insufficient documentation
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
## 2021-02-15 NOTE — Telephone Encounter (Signed)
TC to request OT consult to adress BUE deficits

## 2021-02-26 NOTE — Telephone Encounter (Signed)
In error

## 2021-03-01 ENCOUNTER — Telehealth: Payer: Self-pay

## 2021-03-01 ENCOUNTER — Ambulatory Visit: Payer: PPO | Attending: Nurse Practitioner

## 2021-03-01 ENCOUNTER — Other Ambulatory Visit: Payer: Self-pay

## 2021-03-01 DIAGNOSIS — R278 Other lack of coordination: Secondary | ICD-10-CM | POA: Insufficient documentation

## 2021-03-01 DIAGNOSIS — G8250 Quadriplegia, unspecified: Secondary | ICD-10-CM | POA: Diagnosis present

## 2021-03-01 DIAGNOSIS — R5381 Other malaise: Secondary | ICD-10-CM | POA: Insufficient documentation

## 2021-03-01 DIAGNOSIS — M25621 Stiffness of right elbow, not elsewhere classified: Secondary | ICD-10-CM | POA: Diagnosis present

## 2021-03-01 DIAGNOSIS — M6281 Muscle weakness (generalized): Secondary | ICD-10-CM | POA: Insufficient documentation

## 2021-03-01 DIAGNOSIS — R2689 Other abnormalities of gait and mobility: Secondary | ICD-10-CM | POA: Insufficient documentation

## 2021-03-01 DIAGNOSIS — M25612 Stiffness of left shoulder, not elsewhere classified: Secondary | ICD-10-CM | POA: Diagnosis present

## 2021-03-01 DIAGNOSIS — G8252 Quadriplegia, C1-C4 incomplete: Secondary | ICD-10-CM | POA: Diagnosis present

## 2021-03-01 DIAGNOSIS — M25611 Stiffness of right shoulder, not elsewhere classified: Secondary | ICD-10-CM | POA: Insufficient documentation

## 2021-03-01 DIAGNOSIS — M25622 Stiffness of left elbow, not elsewhere classified: Secondary | ICD-10-CM | POA: Diagnosis present

## 2021-03-01 NOTE — Telephone Encounter (Signed)
TC to Aleatha Borer NP to request OT consult

## 2021-03-02 DIAGNOSIS — Z9359 Other cystostomy status: Secondary | ICD-10-CM | POA: Diagnosis not present
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
## 2021-03-06 DIAGNOSIS — Z993 Dependence on wheelchair: Secondary | ICD-10-CM | POA: Diagnosis not present

## 2021-03-06 DIAGNOSIS — I429 Cardiomyopathy, unspecified: Secondary | ICD-10-CM | POA: Diagnosis not present

## 2021-03-06 DIAGNOSIS — G8929 Other chronic pain: Secondary | ICD-10-CM | POA: Diagnosis not present

## 2021-03-06 DIAGNOSIS — G8252 Quadriplegia, C1-C4 incomplete: Secondary | ICD-10-CM | POA: Diagnosis not present

## 2021-03-06 DIAGNOSIS — Z950 Presence of cardiac pacemaker: Secondary | ICD-10-CM | POA: Diagnosis not present

## 2021-03-06 DIAGNOSIS — E039 Hypothyroidism, unspecified: Secondary | ICD-10-CM | POA: Diagnosis not present

## 2021-03-06 DIAGNOSIS — N319 Neuromuscular dysfunction of bladder, unspecified: Secondary | ICD-10-CM | POA: Diagnosis not present

## 2021-03-06 DIAGNOSIS — Z87440 Personal history of urinary (tract) infections: Secondary | ICD-10-CM | POA: Diagnosis not present

## 2021-03-06 DIAGNOSIS — Z79899 Other long term (current) drug therapy: Secondary | ICD-10-CM | POA: Diagnosis not present

## 2021-03-06 DIAGNOSIS — Z960 Presence of urogenital implants: Secondary | ICD-10-CM | POA: Diagnosis not present

## 2021-03-06 DIAGNOSIS — K5904 Chronic idiopathic constipation: Secondary | ICD-10-CM | POA: Diagnosis not present

## 2021-03-06 DIAGNOSIS — S14102S Unspecified injury at C2 level of cervical spinal cord, sequela: Secondary | ICD-10-CM | POA: Diagnosis not present

## 2021-03-06 DIAGNOSIS — M545 Low back pain, unspecified: Secondary | ICD-10-CM | POA: Diagnosis not present

## 2021-03-06 DIAGNOSIS — Z466 Encounter for fitting and adjustment of urinary device: Secondary | ICD-10-CM | POA: Diagnosis not present

## 2021-03-06 DIAGNOSIS — M542 Cervicalgia: Secondary | ICD-10-CM | POA: Diagnosis not present

## 2021-03-06 DIAGNOSIS — D510 Vitamin B12 deficiency anemia due to intrinsic factor deficiency: Secondary | ICD-10-CM | POA: Diagnosis not present

## 2021-03-06 DIAGNOSIS — I10 Essential (primary) hypertension: Secondary | ICD-10-CM | POA: Diagnosis not present

## 2021-03-06 DIAGNOSIS — G35 Multiple sclerosis: Secondary | ICD-10-CM | POA: Diagnosis not present

## 2021-03-06 DIAGNOSIS — Z9181 History of falling: Secondary | ICD-10-CM | POA: Diagnosis not present

## 2021-03-08 DIAGNOSIS — N390 Urinary tract infection, site not specified: Secondary | ICD-10-CM | POA: Diagnosis not present

## 2021-03-08 DIAGNOSIS — N319 Neuromuscular dysfunction of bladder, unspecified: Secondary | ICD-10-CM | POA: Diagnosis not present

## 2021-03-08 DIAGNOSIS — Z9359 Other cystostomy status: Secondary | ICD-10-CM | POA: Diagnosis not present

## 2021-03-14 ENCOUNTER — Ambulatory Visit: Payer: PPO | Admitting: Neurology

## 2021-03-14 ENCOUNTER — Encounter: Payer: Self-pay | Admitting: Neurology

## 2021-03-14 VITALS — BP 75/52 | HR 94

## 2021-03-14 DIAGNOSIS — G8250 Quadriplegia, unspecified: Secondary | ICD-10-CM | POA: Diagnosis not present

## 2021-03-14 MED ORDER — INCOBOTULINUMTOXINA 100 UNITS IM SOLR
600.0000 [IU] | INTRAMUSCULAR | Status: DC
  Administered 2021-03-14: 600 [IU] via INTRAMUSCULAR

## 2021-03-14 NOTE — Progress Notes (Signed)
XEOMIN 100 UNITS X 600 vial [id number] Exp-12/2022 [id number] B/B
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
## 2021-03-21 ENCOUNTER — Ambulatory Visit: Payer: PPO

## 2021-03-21 ENCOUNTER — Ambulatory Visit: Payer: PPO | Admitting: Occupational Therapy

## 2021-03-21 ENCOUNTER — Other Ambulatory Visit: Payer: Self-pay

## 2021-03-21 DIAGNOSIS — M6281 Muscle weakness (generalized): Secondary | ICD-10-CM | POA: Diagnosis not present

## 2021-03-21 DIAGNOSIS — M25621 Stiffness of right elbow, not elsewhere classified: Secondary | ICD-10-CM

## 2021-03-21 DIAGNOSIS — R2689 Other abnormalities of gait and mobility: Secondary | ICD-10-CM

## 2021-03-21 DIAGNOSIS — R278 Other lack of coordination: Secondary | ICD-10-CM

## 2021-03-21 DIAGNOSIS — R5381 Other malaise: Secondary | ICD-10-CM

## 2021-03-21 DIAGNOSIS — M25622 Stiffness of left elbow, not elsewhere classified: Secondary | ICD-10-CM

## 2021-03-21 DIAGNOSIS — G8250 Quadriplegia, unspecified: Secondary | ICD-10-CM

## 2021-03-21 DIAGNOSIS — G8252 Quadriplegia, C1-C4 incomplete: Secondary | ICD-10-CM

## 2021-03-21 DIAGNOSIS — M25611 Stiffness of right shoulder, not elsewhere classified: Secondary | ICD-10-CM

## 2021-03-21 DIAGNOSIS — M25612 Stiffness of left shoulder, not elsewhere classified: Secondary | ICD-10-CM
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
## 2021-03-22 ENCOUNTER — Encounter: Payer: Self-pay | Admitting: Occupational Therapy

## 2021-03-22 ENCOUNTER — Ambulatory Visit: Payer: PPO | Attending: Nurse Practitioner | Admitting: Physical Therapy

## 2021-03-22 DIAGNOSIS — M25612 Stiffness of left shoulder, not elsewhere classified: Secondary | ICD-10-CM | POA: Insufficient documentation

## 2021-03-22 DIAGNOSIS — M25622 Stiffness of left elbow, not elsewhere classified: Secondary | ICD-10-CM | POA: Diagnosis present

## 2021-03-22 DIAGNOSIS — G8252 Quadriplegia, C1-C4 incomplete: Secondary | ICD-10-CM | POA: Insufficient documentation

## 2021-03-22 DIAGNOSIS — M6281 Muscle weakness (generalized): Secondary | ICD-10-CM | POA: Insufficient documentation

## 2021-03-22 DIAGNOSIS — R278 Other lack of coordination: Secondary | ICD-10-CM | POA: Insufficient documentation

## 2021-03-22 DIAGNOSIS — M25621 Stiffness of right elbow, not elsewhere classified: Secondary | ICD-10-CM | POA: Insufficient documentation

## 2021-03-22 DIAGNOSIS — M25611 Stiffness of right shoulder, not elsewhere classified: Secondary | ICD-10-CM | POA: Insufficient documentation

## 2021-03-22 NOTE — Patient Instructions (Signed)
Access Code: [email or web address] URL: [email or web address] Date: 03/22/2021 Prepared by: Bufford Lope  Exercises Hip Abduction and Adduction Caregiver PROM - 2-3 x daily - 7 x weekly - 2 sets - 10 reps Hip Internal and External Rotation Caregiver PROM - 2-3 x daily - 7 x weekly - 2 sets - 10 reps Supine Hamstring Stretch with Caregiver - 2-3 x daily - 7 x weekly - 2 sets - 30 second hold

## 2021-03-22 NOTE — Addendum Note (Signed)
Addended by: Junious Dresser on: 03/22/2021 10:26 AM   Modules accepted: Orders
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
## 2021-03-28 ENCOUNTER — Ambulatory Visit: Payer: PPO | Admitting: Physical Therapy

## 2021-03-29 ENCOUNTER — Ambulatory Visit: Payer: PPO

## 2021-03-29 ENCOUNTER — Other Ambulatory Visit: Payer: Self-pay

## 2021-03-29 DIAGNOSIS — M6281 Muscle weakness (generalized): Secondary | ICD-10-CM
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
## 2021-04-03 ENCOUNTER — Encounter: Payer: Self-pay | Admitting: Occupational Therapy

## 2021-04-03 ENCOUNTER — Other Ambulatory Visit: Payer: Self-pay

## 2021-04-03 ENCOUNTER — Ambulatory Visit: Payer: PPO | Admitting: Occupational Therapy

## 2021-04-03 ENCOUNTER — Ambulatory Visit: Payer: PPO | Admitting: Physical Therapy

## 2021-04-03 DIAGNOSIS — M6281 Muscle weakness (generalized): Secondary | ICD-10-CM | POA: Diagnosis not present

## 2021-04-03 DIAGNOSIS — G8252 Quadriplegia, C1-C4 incomplete: Secondary | ICD-10-CM

## 2021-04-03 DIAGNOSIS — M25622 Stiffness of left elbow, not elsewhere classified: Secondary | ICD-10-CM

## 2021-04-03 DIAGNOSIS — R278 Other lack of coordination: Secondary | ICD-10-CM

## 2021-04-03 DIAGNOSIS — M25612 Stiffness of left shoulder, not elsewhere classified: Secondary | ICD-10-CM

## 2021-04-03 DIAGNOSIS — M25621 Stiffness of right elbow, not elsewhere classified: Secondary | ICD-10-CM

## 2021-04-03 DIAGNOSIS — M25611 Stiffness of right shoulder, not elsewhere classified: Secondary | ICD-10-CM
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
## 2021-04-05 ENCOUNTER — Ambulatory Visit: Payer: PPO | Admitting: Physical Therapy

## 2021-04-05 ENCOUNTER — Encounter: Payer: Self-pay | Admitting: Occupational Therapy

## 2021-04-05 ENCOUNTER — Other Ambulatory Visit: Payer: Self-pay

## 2021-04-05 ENCOUNTER — Ambulatory Visit: Payer: PPO | Admitting: Occupational Therapy

## 2021-04-05 DIAGNOSIS — M25621 Stiffness of right elbow, not elsewhere classified: Secondary | ICD-10-CM

## 2021-04-05 DIAGNOSIS — M6281 Muscle weakness (generalized): Secondary | ICD-10-CM | POA: Diagnosis not present

## 2021-04-05 DIAGNOSIS — M25612 Stiffness of left shoulder, not elsewhere classified: Secondary | ICD-10-CM

## 2021-04-05 DIAGNOSIS — M25611 Stiffness of right shoulder, not elsewhere classified: Secondary | ICD-10-CM

## 2021-04-05 DIAGNOSIS — R278 Other lack of coordination: Secondary | ICD-10-CM

## 2021-04-05 DIAGNOSIS — M25622 Stiffness of left elbow, not elsewhere classified: Secondary | ICD-10-CM

## 2021-04-05 DIAGNOSIS — G8252 Quadriplegia, C1-C4 incomplete: Secondary | ICD-10-CM
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
## 2021-04-10 ENCOUNTER — Encounter: Payer: Self-pay | Admitting: Occupational Therapy

## 2021-04-10 ENCOUNTER — Ambulatory Visit: Payer: PPO | Admitting: Occupational Therapy

## 2021-04-10 ENCOUNTER — Other Ambulatory Visit: Payer: Self-pay

## 2021-04-10 DIAGNOSIS — M6281 Muscle weakness (generalized): Secondary | ICD-10-CM

## 2021-04-10 DIAGNOSIS — M25612 Stiffness of left shoulder, not elsewhere classified: Secondary | ICD-10-CM

## 2021-04-10 DIAGNOSIS — R278 Other lack of coordination: Secondary | ICD-10-CM

## 2021-04-10 DIAGNOSIS — M25611 Stiffness of right shoulder, not elsewhere classified: Secondary | ICD-10-CM

## 2021-04-10 DIAGNOSIS — M25621 Stiffness of right elbow, not elsewhere classified: Secondary | ICD-10-CM

## 2021-04-10 DIAGNOSIS — M25622 Stiffness of left elbow, not elsewhere classified: Secondary | ICD-10-CM

## 2021-04-10 DIAGNOSIS — G8252 Quadriplegia, C1-C4 incomplete: Secondary | ICD-10-CM
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
## 2021-04-13 ENCOUNTER — Ambulatory Visit: Payer: PPO | Admitting: Occupational Therapy

## 2021-04-17 ENCOUNTER — Ambulatory Visit: Payer: PPO | Admitting: Occupational Therapy

## 2021-04-17 ENCOUNTER — Other Ambulatory Visit: Payer: Self-pay

## 2021-04-17 ENCOUNTER — Encounter: Payer: Self-pay | Admitting: Occupational Therapy

## 2021-04-17 DIAGNOSIS — M25612 Stiffness of left shoulder, not elsewhere classified: Secondary | ICD-10-CM

## 2021-04-17 DIAGNOSIS — M25621 Stiffness of right elbow, not elsewhere classified: Secondary | ICD-10-CM

## 2021-04-17 DIAGNOSIS — G8252 Quadriplegia, C1-C4 incomplete: Secondary | ICD-10-CM

## 2021-04-17 DIAGNOSIS — M6281 Muscle weakness (generalized): Secondary | ICD-10-CM

## 2021-04-17 DIAGNOSIS — R278 Other lack of coordination: Secondary | ICD-10-CM

## 2021-04-17 DIAGNOSIS — M25611 Stiffness of right shoulder, not elsewhere classified: Secondary | ICD-10-CM

## 2021-04-17 DIAGNOSIS — M25622 Stiffness of left elbow, not elsewhere classified: Secondary | ICD-10-CM
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
## 2021-04-20 ENCOUNTER — Encounter: Payer: Self-pay | Admitting: Occupational Therapy

## 2021-04-20 ENCOUNTER — Ambulatory Visit: Payer: PPO | Admitting: Occupational Therapy

## 2021-04-20 ENCOUNTER — Other Ambulatory Visit: Payer: Self-pay

## 2021-04-20 DIAGNOSIS — M25622 Stiffness of left elbow, not elsewhere classified: Secondary | ICD-10-CM

## 2021-04-20 DIAGNOSIS — R278 Other lack of coordination: Secondary | ICD-10-CM

## 2021-04-20 DIAGNOSIS — M6281 Muscle weakness (generalized): Secondary | ICD-10-CM

## 2021-04-20 DIAGNOSIS — M25621 Stiffness of right elbow, not elsewhere classified: Secondary | ICD-10-CM

## 2021-04-20 DIAGNOSIS — M25612 Stiffness of left shoulder, not elsewhere classified: Secondary | ICD-10-CM

## 2021-04-20 DIAGNOSIS — G8252 Quadriplegia, C1-C4 incomplete: Secondary | ICD-10-CM

## 2021-04-20 DIAGNOSIS — M25611 Stiffness of right shoulder, not elsewhere classified: Secondary | ICD-10-CM
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
## 2021-04-24 ENCOUNTER — Ambulatory Visit: Payer: PPO | Admitting: Occupational Therapy

## 2021-04-26 ENCOUNTER — Encounter: Payer: Self-pay | Admitting: Occupational Therapy

## 2021-04-26 ENCOUNTER — Ambulatory Visit: Payer: PPO | Attending: Nurse Practitioner | Admitting: Occupational Therapy

## 2021-04-26 ENCOUNTER — Other Ambulatory Visit: Payer: Self-pay

## 2021-04-26 DIAGNOSIS — M25612 Stiffness of left shoulder, not elsewhere classified: Secondary | ICD-10-CM | POA: Diagnosis present

## 2021-04-26 DIAGNOSIS — M25621 Stiffness of right elbow, not elsewhere classified: Secondary | ICD-10-CM

## 2021-04-26 DIAGNOSIS — M25611 Stiffness of right shoulder, not elsewhere classified: Secondary | ICD-10-CM | POA: Diagnosis present

## 2021-04-26 DIAGNOSIS — R2689 Other abnormalities of gait and mobility: Secondary | ICD-10-CM | POA: Diagnosis present

## 2021-04-26 DIAGNOSIS — G8252 Quadriplegia, C1-C4 incomplete: Secondary | ICD-10-CM | POA: Diagnosis present

## 2021-04-26 DIAGNOSIS — R278 Other lack of coordination: Secondary | ICD-10-CM | POA: Diagnosis present

## 2021-04-26 DIAGNOSIS — M6281 Muscle weakness (generalized): Secondary | ICD-10-CM | POA: Diagnosis present

## 2021-04-26 DIAGNOSIS — M25622 Stiffness of left elbow, not elsewhere classified: Secondary | ICD-10-CM | POA: Diagnosis present
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
## 2021-05-01 ENCOUNTER — Ambulatory Visit: Payer: PPO | Admitting: Occupational Therapy

## 2021-05-01 ENCOUNTER — Encounter: Payer: Self-pay | Admitting: Occupational Therapy

## 2021-05-01 ENCOUNTER — Other Ambulatory Visit: Payer: Self-pay

## 2021-05-01 DIAGNOSIS — M25612 Stiffness of left shoulder, not elsewhere classified: Secondary | ICD-10-CM

## 2021-05-01 DIAGNOSIS — R278 Other lack of coordination: Secondary | ICD-10-CM

## 2021-05-01 DIAGNOSIS — R2689 Other abnormalities of gait and mobility: Secondary | ICD-10-CM

## 2021-05-01 DIAGNOSIS — M25611 Stiffness of right shoulder, not elsewhere classified: Secondary | ICD-10-CM

## 2021-05-01 DIAGNOSIS — G8252 Quadriplegia, C1-C4 incomplete: Secondary | ICD-10-CM

## 2021-05-01 DIAGNOSIS — M6281 Muscle weakness (generalized): Secondary | ICD-10-CM

## 2021-05-01 DIAGNOSIS — M25621 Stiffness of right elbow, not elsewhere classified: Secondary | ICD-10-CM

## 2021-05-01 DIAGNOSIS — M25622 Stiffness of left elbow, not elsewhere classified: Secondary | ICD-10-CM
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
## 2021-05-03 ENCOUNTER — Ambulatory Visit: Payer: PPO | Admitting: Occupational Therapy

## 2021-05-03 ENCOUNTER — Encounter: Payer: Self-pay | Admitting: Occupational Therapy

## 2021-05-03 ENCOUNTER — Other Ambulatory Visit: Payer: Self-pay

## 2021-05-03 DIAGNOSIS — R278 Other lack of coordination: Secondary | ICD-10-CM

## 2021-05-03 DIAGNOSIS — M25611 Stiffness of right shoulder, not elsewhere classified: Secondary | ICD-10-CM

## 2021-05-03 DIAGNOSIS — M25612 Stiffness of left shoulder, not elsewhere classified: Secondary | ICD-10-CM

## 2021-05-03 DIAGNOSIS — M25622 Stiffness of left elbow, not elsewhere classified: Secondary | ICD-10-CM

## 2021-05-03 DIAGNOSIS — M6281 Muscle weakness (generalized): Secondary | ICD-10-CM

## 2021-05-03 DIAGNOSIS — G8252 Quadriplegia, C1-C4 incomplete: Secondary | ICD-10-CM

## 2021-05-03 DIAGNOSIS — M25621 Stiffness of right elbow, not elsewhere classified: Secondary | ICD-10-CM
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
## 2021-05-08 ENCOUNTER — Ambulatory Visit: Payer: PPO | Admitting: Occupational Therapy

## 2021-05-10 ENCOUNTER — Other Ambulatory Visit: Payer: Self-pay

## 2021-05-10 ENCOUNTER — Ambulatory Visit: Payer: PPO | Admitting: Occupational Therapy

## 2021-05-10 ENCOUNTER — Encounter: Payer: Self-pay | Admitting: Occupational Therapy

## 2021-05-10 DIAGNOSIS — M6281 Muscle weakness (generalized): Secondary | ICD-10-CM

## 2021-05-10 DIAGNOSIS — R278 Other lack of coordination: Secondary | ICD-10-CM | POA: Diagnosis not present

## 2021-05-10 DIAGNOSIS — M25612 Stiffness of left shoulder, not elsewhere classified: Secondary | ICD-10-CM

## 2021-05-10 DIAGNOSIS — G8252 Quadriplegia, C1-C4 incomplete: Secondary | ICD-10-CM

## 2021-05-10 DIAGNOSIS — M25621 Stiffness of right elbow, not elsewhere classified: Secondary | ICD-10-CM

## 2021-05-10 DIAGNOSIS — M25622 Stiffness of left elbow, not elsewhere classified: Secondary | ICD-10-CM

## 2021-05-10 DIAGNOSIS — M25611 Stiffness of right shoulder, not elsewhere classified: Secondary | ICD-10-CM
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
## 2021-05-16 ENCOUNTER — Ambulatory Visit: Payer: PPO | Admitting: Occupational Therapy

## 2021-05-16 ENCOUNTER — Other Ambulatory Visit: Payer: Self-pay

## 2021-05-16 DIAGNOSIS — R278 Other lack of coordination: Secondary | ICD-10-CM
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
## 2021-05-22 ENCOUNTER — Encounter: Payer: Self-pay | Admitting: Occupational Therapy

## 2021-05-22 ENCOUNTER — Other Ambulatory Visit: Payer: Self-pay

## 2021-05-22 ENCOUNTER — Ambulatory Visit: Payer: PPO | Admitting: Occupational Therapy

## 2021-05-22 DIAGNOSIS — M25611 Stiffness of right shoulder, not elsewhere classified: Secondary | ICD-10-CM

## 2021-05-22 DIAGNOSIS — M25612 Stiffness of left shoulder, not elsewhere classified: Secondary | ICD-10-CM

## 2021-05-22 DIAGNOSIS — M6281 Muscle weakness (generalized): Secondary | ICD-10-CM

## 2021-05-22 DIAGNOSIS — M25621 Stiffness of right elbow, not elsewhere classified: Secondary | ICD-10-CM

## 2021-05-22 DIAGNOSIS — G8252 Quadriplegia, C1-C4 incomplete: Secondary | ICD-10-CM

## 2021-05-22 DIAGNOSIS — M25622 Stiffness of left elbow, not elsewhere classified: Secondary | ICD-10-CM

## 2021-05-22 DIAGNOSIS — R278 Other lack of coordination: Secondary | ICD-10-CM | POA: Diagnosis not present
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
## 2021-05-24 ENCOUNTER — Encounter: Payer: Self-pay | Admitting: Occupational Therapy

## 2021-05-24 ENCOUNTER — Other Ambulatory Visit: Payer: Self-pay

## 2021-05-24 ENCOUNTER — Ambulatory Visit: Payer: PPO | Attending: Nurse Practitioner | Admitting: Occupational Therapy

## 2021-05-24 DIAGNOSIS — R278 Other lack of coordination: Secondary | ICD-10-CM | POA: Insufficient documentation

## 2021-05-24 DIAGNOSIS — M25611 Stiffness of right shoulder, not elsewhere classified: Secondary | ICD-10-CM | POA: Diagnosis present

## 2021-05-24 DIAGNOSIS — M25621 Stiffness of right elbow, not elsewhere classified: Secondary | ICD-10-CM | POA: Diagnosis present

## 2021-05-24 DIAGNOSIS — M25612 Stiffness of left shoulder, not elsewhere classified: Secondary | ICD-10-CM | POA: Diagnosis present

## 2021-05-24 DIAGNOSIS — G8252 Quadriplegia, C1-C4 incomplete: Secondary | ICD-10-CM | POA: Insufficient documentation

## 2021-05-24 DIAGNOSIS — M6281 Muscle weakness (generalized): Secondary | ICD-10-CM | POA: Insufficient documentation

## 2021-05-24 DIAGNOSIS — M25622 Stiffness of left elbow, not elsewhere classified: Secondary | ICD-10-CM | POA: Diagnosis present
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
## 2021-05-28 ENCOUNTER — Other Ambulatory Visit: Payer: Self-pay

## 2021-05-28 ENCOUNTER — Ambulatory Visit: Payer: PPO | Admitting: Occupational Therapy

## 2021-05-28 ENCOUNTER — Encounter: Payer: Self-pay | Admitting: Occupational Therapy

## 2021-05-28 DIAGNOSIS — R278 Other lack of coordination: Secondary | ICD-10-CM | POA: Diagnosis not present

## 2021-05-28 DIAGNOSIS — M25612 Stiffness of left shoulder, not elsewhere classified: Secondary | ICD-10-CM

## 2021-05-28 DIAGNOSIS — M25622 Stiffness of left elbow, not elsewhere classified: Secondary | ICD-10-CM

## 2021-05-28 DIAGNOSIS — M25621 Stiffness of right elbow, not elsewhere classified: Secondary | ICD-10-CM

## 2021-05-28 DIAGNOSIS — G8252 Quadriplegia, C1-C4 incomplete: Secondary | ICD-10-CM

## 2021-05-28 DIAGNOSIS — M6281 Muscle weakness (generalized): Secondary | ICD-10-CM

## 2021-05-28 DIAGNOSIS — M25611 Stiffness of right shoulder, not elsewhere classified: Secondary | ICD-10-CM
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
## 2021-05-31 ENCOUNTER — Encounter: Payer: PPO | Admitting: Occupational Therapy

## 2021-06-05 ENCOUNTER — Encounter: Payer: PPO | Admitting: Occupational Therapy

## 2021-06-07 ENCOUNTER — Encounter: Payer: PPO | Admitting: Occupational Therapy

## 2021-06-20 ENCOUNTER — Ambulatory Visit: Payer: PPO | Admitting: Neurology

## 2021-07-05 IMAGING — DX DG CHEST 1V PORT
1 series · 1 of 1 positions shown · non-contrast
Comparison: No prior.

CLINICAL DATA: Generalized weakness.

EXAM:
PORTABLE CHEST 1 VIEW

[chest ap]
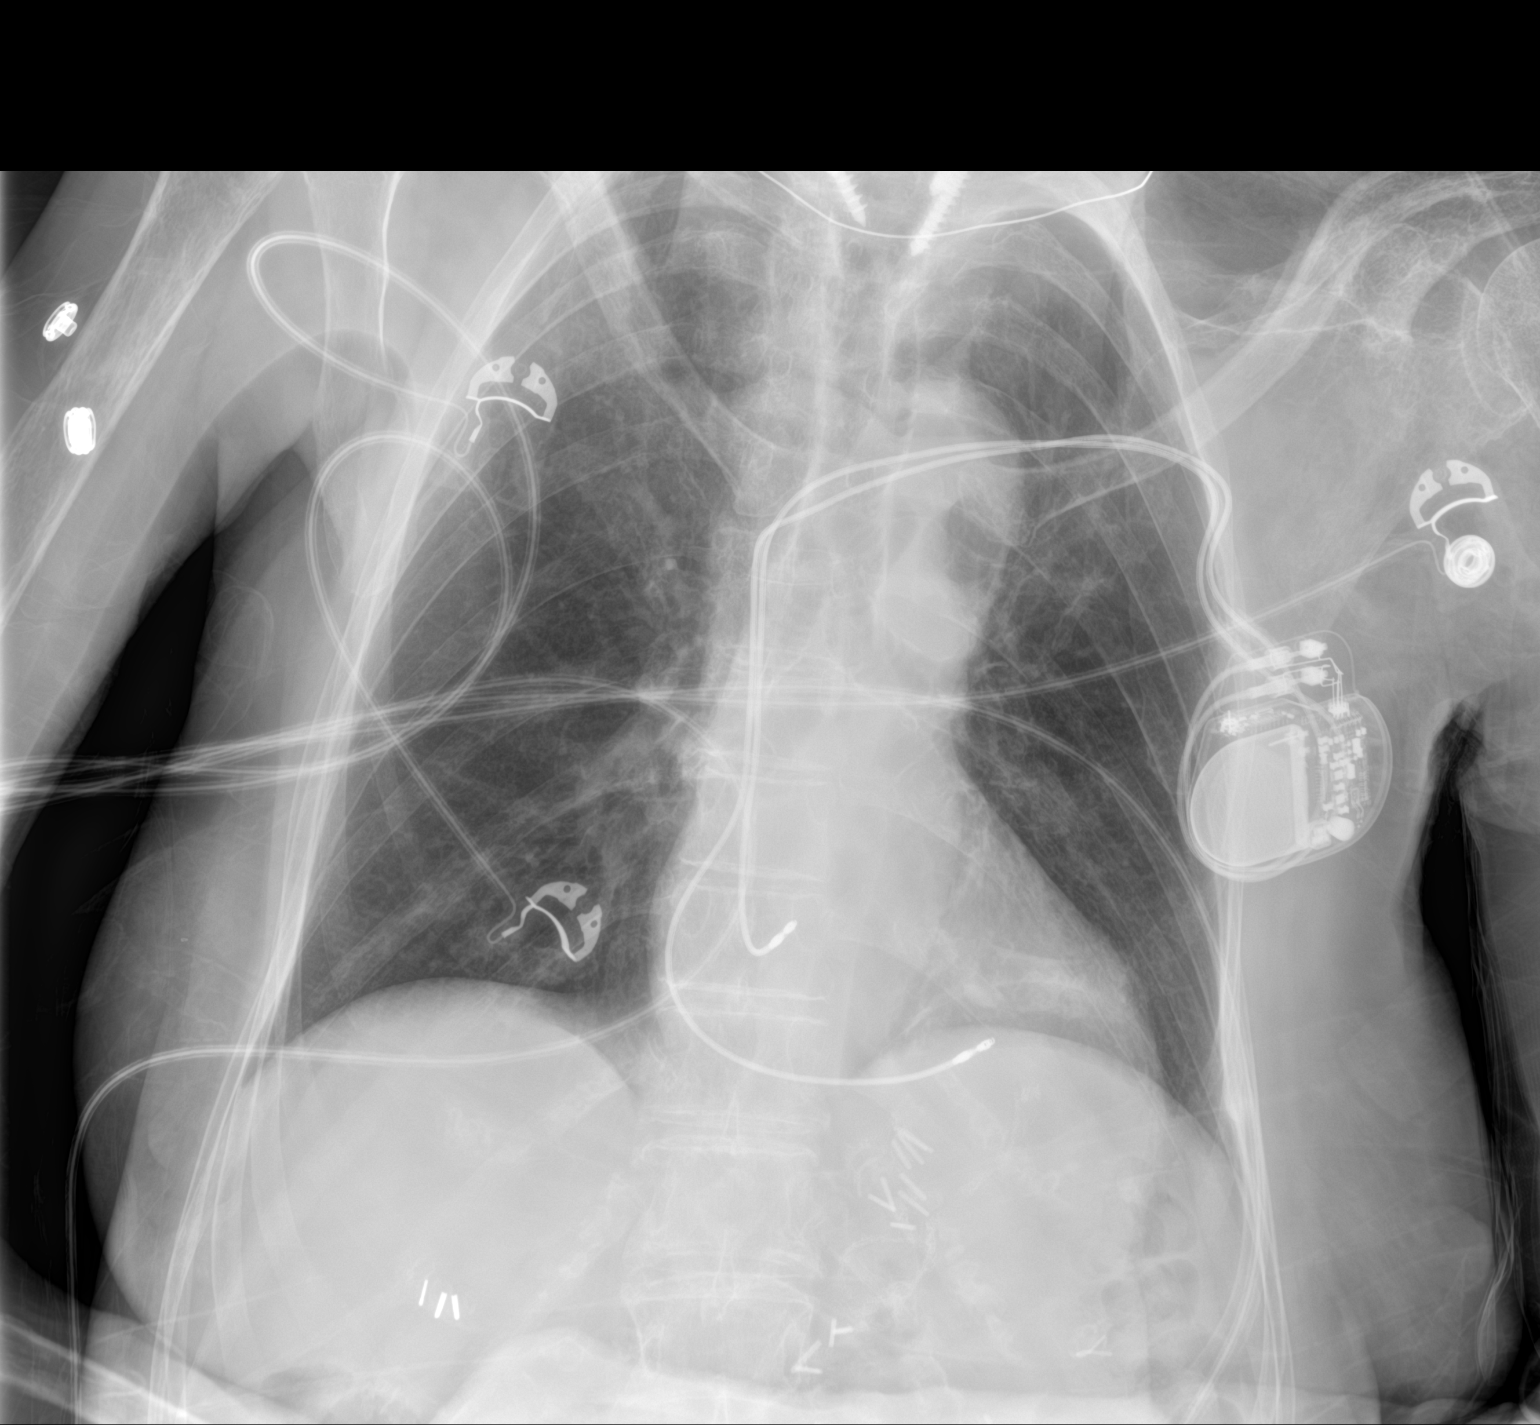

[1 of 1 positions shown; findings below may reference images not displayed]

FINDINGS: Cardiac pacer with lead tip over the right atrium right ventricle.
Heart size normal. No pulmonary venous congestion. Lungs are clear.
No pleural effusion or pneumothorax. Surgical clips upper abdomen.
Prior cervical spine fusion.
IMPRESSION: 1. Cardiac pacer with lead tip over the right atrium and right
ventricle. Heart size normal.

2.  No acute pulmonary disease.

## 2021-07-08 ENCOUNTER — Other Ambulatory Visit: Payer: Self-pay | Admitting: Neurology

## 2021-07-25 ENCOUNTER — Encounter: Payer: Self-pay | Admitting: Neurology

## 2021-07-25 ENCOUNTER — Ambulatory Visit: Payer: PPO | Admitting: Neurology

## 2021-07-25 VITALS — BP 87/61 | HR 85

## 2021-07-25 DIAGNOSIS — G8250 Quadriplegia, unspecified: Secondary | ICD-10-CM

## 2021-07-25 MED ORDER — DULOXETINE HCL 60 MG PO CPEP: ORAL_CAPSULE | ORAL | 11 refills | Status: DC

## 2021-07-25 MED ORDER — INCOBOTULINUMTOXINA 100 UNITS IM SOLR
600.0000 [IU] | INTRAMUSCULAR | Status: DC
  Administered 2021-07-25: 600 [IU] via INTRAMUSCULAR
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
## 2021-08-17 ENCOUNTER — Other Ambulatory Visit: Payer: Self-pay | Admitting: Neurology

## 2021-09-26 ENCOUNTER — Ambulatory Visit: Payer: PPO | Admitting: Neurology

## 2021-10-24 ENCOUNTER — Telehealth: Payer: Self-pay | Admitting: Neurology

## 2021-10-24 ENCOUNTER — Ambulatory Visit: Payer: PPO | Admitting: Neurology

## 2021-10-24 VITALS — BP 113/74 | HR 70

## 2021-10-24 DIAGNOSIS — G8250 Quadriplegia, unspecified: Secondary | ICD-10-CM

## 2021-10-24 NOTE — Telephone Encounter (Signed)
change to xeomin to 400 unites from current 600 units at next injection in Oct 2023
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
## 2021-10-24 NOTE — Progress Notes (Signed)
Xeomin 100 units x 6 vials  [id number] [id number] Exp-09-2023 B/B

## 2021-10-31 DIAGNOSIS — G8250 Quadriplegia, unspecified: Secondary | ICD-10-CM

## 2021-10-31 MED ORDER — INCOBOTULINUMTOXINA 100 UNITS IM SOLR
600.0000 [IU] | INTRAMUSCULAR | Status: DC
  Administered 2021-10-31: 600 [IU] via INTRAMUSCULAR

## 2021-11-01 IMAGING — DX DG CHEST 1V
1 series · 1 of 1 positions shown · non-contrast
Comparison: None.

CLINICAL DATA: Cough

EXAM:
CHEST  1 VIEW

[chest ap]
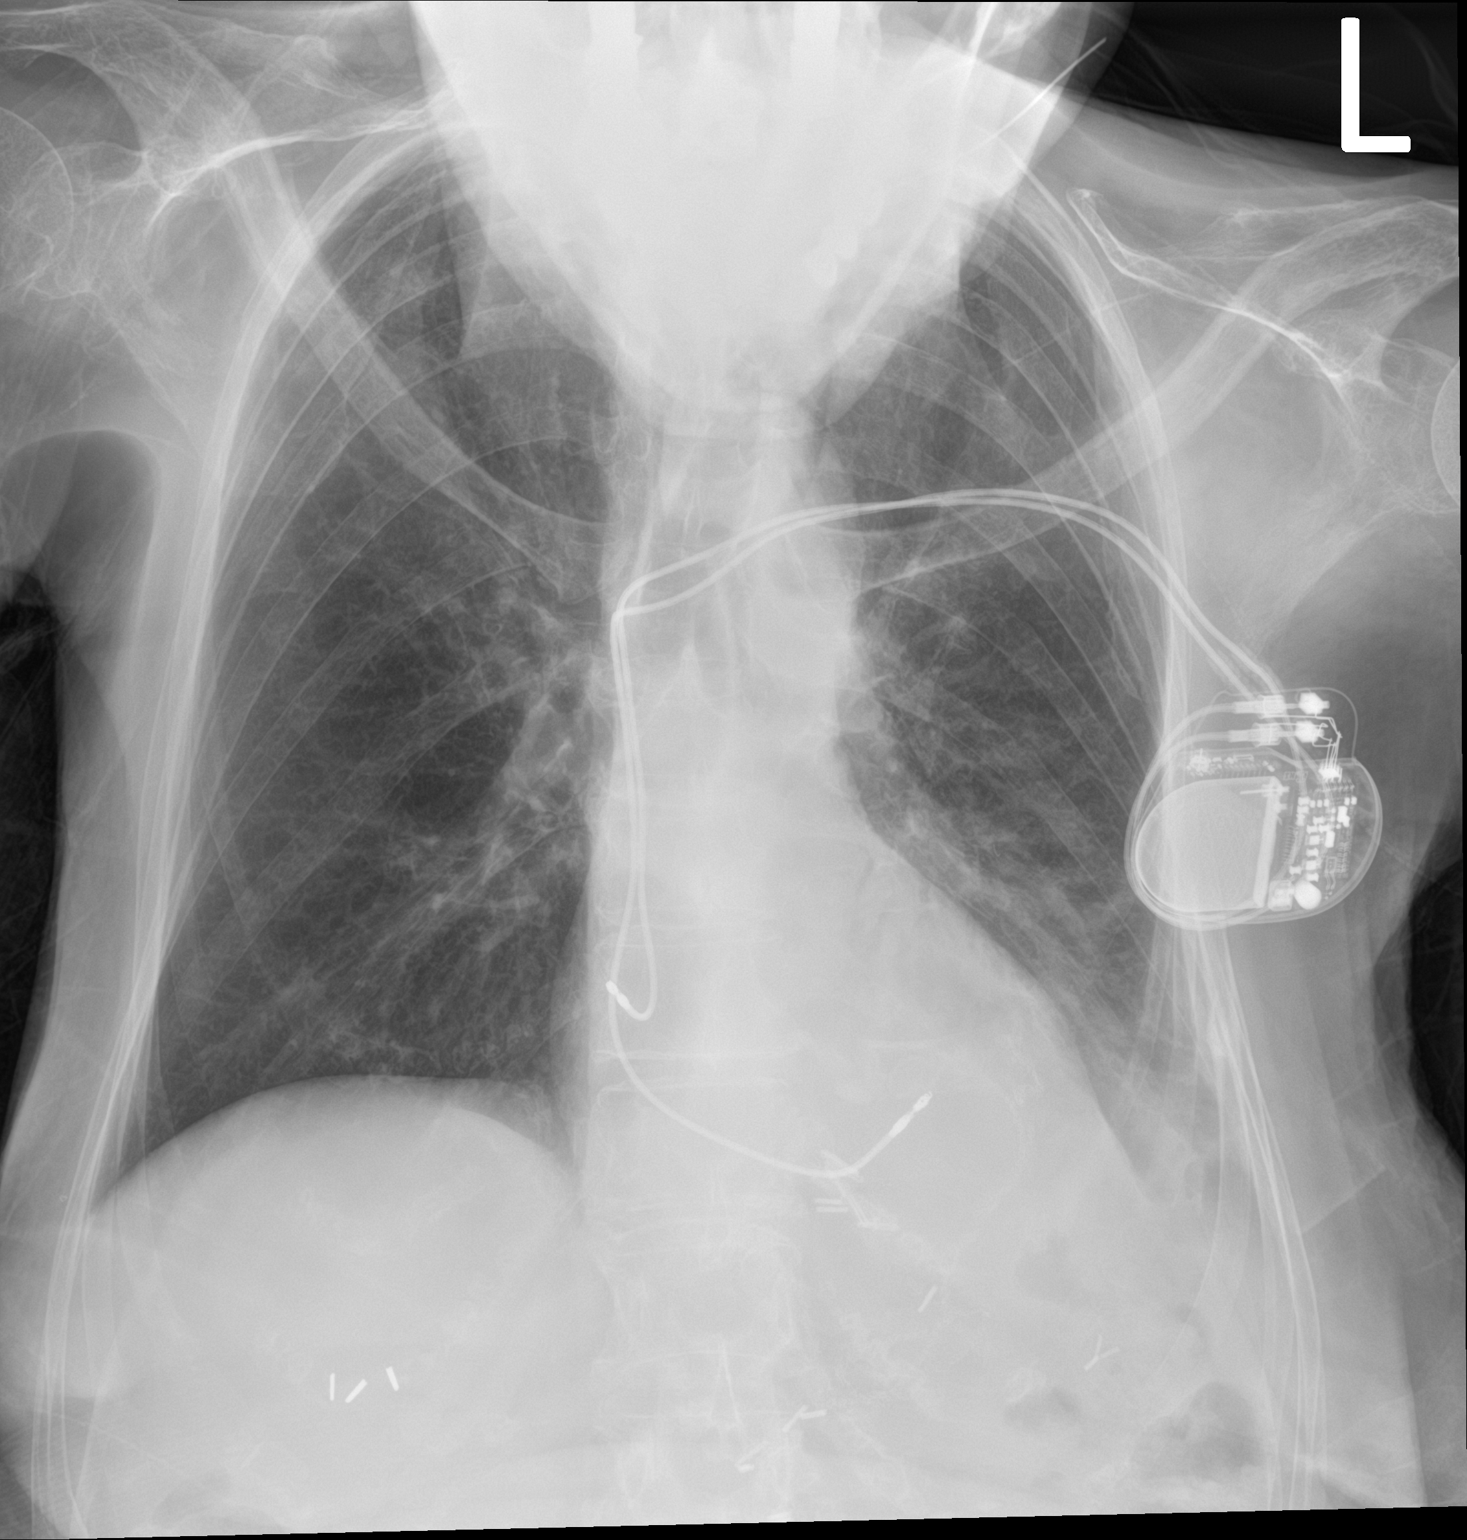

[1 of 1 positions shown; findings below may reference images not displayed]

FINDINGS: Left basilar opacities. Lungs are hyperexpanded. Normal
cardiomediastinal contours. Left chest wall pacemaker with leads in
expected location.
IMPRESSION: Left basilar opacities may indicate atelectasis or infection.

## 2021-12-03 ENCOUNTER — Encounter (HOSPITAL_BASED_OUTPATIENT_CLINIC_OR_DEPARTMENT_OTHER): Payer: PPO | Attending: Internal Medicine | Admitting: Internal Medicine

## 2021-12-03 DIAGNOSIS — I442 Atrioventricular block, complete: Secondary | ICD-10-CM | POA: Diagnosis not present

## 2021-12-03 DIAGNOSIS — Z993 Dependence on wheelchair: Secondary | ICD-10-CM | POA: Insufficient documentation

## 2021-12-03 DIAGNOSIS — G8250 Quadriplegia, unspecified: Secondary | ICD-10-CM | POA: Insufficient documentation

## 2021-12-03 DIAGNOSIS — L89153 Pressure ulcer of sacral region, stage 3: Secondary | ICD-10-CM | POA: Diagnosis not present

## 2021-12-03 DIAGNOSIS — G35 Multiple sclerosis: Secondary | ICD-10-CM | POA: Diagnosis not present

## 2021-12-03 DIAGNOSIS — Z950 Presence of cardiac pacemaker: Secondary | ICD-10-CM | POA: Diagnosis not present
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
## 2021-12-07 NOTE — Progress Notes (Signed)
Erin, [name] ([id number]) Visit Report for 12/03/2021 Allergy List Details Patient Name: Date of Service: Erin, [name] RLO TTE 12/03/2021 9:45 A M Medical Record Number: [id number] Patient Account Number: [id number] Date of Birth/Sex: Treating RN: November 05, 1945 (76 y.o. [name], [name] Primary Care Ziv Welchel: Fonnie Jarvis Other Clinician: Referring Antoinette Haskett: Treating Catha Ontko/Extender: Dorann Ou in Treatment: 0 Allergies Active Allergies cefuroxime Reaction: swelling Cephalosporins Reaction: hives pregabalin Reaction: swelling and bladder problems Allergy Notes per patient tolerated penicillin in the past. Electronic Signature(s) Signed: 12/03/2021 3:10:00 PM By: Erenest Blank Entered By: Erenest Blank on 12/03/2021 10:38:05 -------------------------------------------------------------------------------- Arrival Information Details Patient Name: Date of Service: [name] RLO TTE 12/03/2021 9:45 A M Medical Record Number: [id number] Patient Account Number: [id number] Date of Birth/Sex: Treating RN: [birth year] (76 y.o. [name], [name] Primary Care Deziah Renwick: Fonnie Jarvis Other Clinician: Referring Finn Amos: Treating Norvella Loscalzo/Extender: Dorann Ou in Treatment: 0 Visit Information Patient Arrived: Ambulatory Arrival Time: 10:23 Accompanied By: daughter Transfer Assistance: Civil Service fast streamer Patient Identification Verified: Yes Secondary Verification Process Completed: Yes Patient Requires Transmission-Based Precautions: No Patient Has Alerts: No History Since Last Visit Added or deleted any medications: No Any new allergies or adverse reactions: No Had a fall or experienced change in activities of daily living that may affect risk of falls: No Signs or symptoms of abuse/neglect since last visito No Hospitalized since last visit: No Implantable device outside of the clinic excluding cellular tissue based  products placed in the center since last visit: No Pain Present Now: Yes Electronic Signature(s) Signed: 12/03/2021 3:10:00 PM By: Erenest Blank Entered By: Erenest Blank on 12/03/2021 10:25:26 -------------------------------------------------------------------------------- Clinic Level of Care Assessment Details Patient Name: Date of Service: [name] RLO TTE 12/03/2021 9:45 A M Medical Record Number: [id number] Patient Account Number: [id number] Date of Birth/Sex: Treating RN: 01-Sep-1945 (76 y.o. [name] Primary Care Hamdan Toscano: Fonnie Jarvis Other Clinician: Referring Andreyah Natividad: Treating Lianny Molter/Extender: Dorann Ou in Treatment: 0 Clinic Level of Care Assessment Items TOOL 1 Quantity Score X- 1 0 Use when EandM and Procedure is performed on INITIAL visit ASSESSMENTS - Nursing Assessment / Reassessment X- 1 20 General Physical Exam (combine w/ comprehensive assessment (listed just below) when performed on new pt. evals) X- 1 25 Comprehensive Assessment (HX, ROS, Risk Assessments, Wounds Hx, etc.) ASSESSMENTS - Wound and Skin Assessment / Reassessment X- 1 10 Dermatologic / Skin Assessment (not related to wound area) ASSESSMENTS - Ostomy and/or Continence Assessment and Care []  - 0 Incontinence Assessment and Management []  - 0 Ostomy Care Assessment and Management (repouching, etc.) PROCESS - Coordination of Care X - Simple Patient / Family Education for ongoing care 1 15 []  - 0 Complex (extensive) Patient / Family Education for ongoing care X- 1 10 Staff obtains Programmer, systems, Records, T Results / Process Orders est X- 1 10 Staff telephones HHA, Nursing Homes / Clarify orders / etc []  - 0 Routine Transfer to another Facility (non-emergent condition) []  - 0 Routine Hospital Admission (non-emergent condition) X- 1 15 New Admissions / Biomedical engineer / Ordering NPWT Apligraf, etc. , []  - 0 Emergency Hospital Admission  (emergent condition) PROCESS - Special Needs []  - 0 Pediatric / Minor Patient Management []  - 0 Isolation Patient Management []  - 0 Hearing / Language / Visual special needs []  - 0 Assessment of Community assistance (transportation, D/C planning, etc.) []  - 0 Additional assistance / Altered mentation []  - 0 Support Surface(s) Assessment (bed, cushion, seat, etc.) INTERVENTIONS - Miscellaneous []  - 0  External ear exam X- 1 10 Patient Transfer (multiple staff / Civil Service fast streamer / Similar devices) []  - 0 Simple Staple / Suture removal (25 or less) []  - 0 Complex Staple / Suture removal (26 or more) []  - 0 Hypo/Hyperglycemic Management (do not check if billed separately) []  - 0 Ankle / Brachial Index (ABI) - do not check if billed separately Has the patient been seen at the hospital within the last three years: Yes Total Score: 115 Level Of Care: New/Established - Level 3 Electronic Signature(s) Signed: 12/03/2021 4:35:20 PM By: Deon Pilling RN, BSN Entered By: Deon Pilling on 12/03/2021 11:12:51 -------------------------------------------------------------------------------- Encounter Discharge Information Details Patient Name: Date of Service: [name] RLO TTE 12/03/2021 9:45 A M Medical Record Number: [id number] Patient Account Number: [id number] Date of Birth/Sex: Treating RN: 03/20/1946 (76 y.o. [name], [name] Primary Care Miyako Oelke: Fonnie Jarvis Other Clinician: Referring Janielle Mittelstadt: Treating Taavi Hoose/Extender: Dorann Ou in Treatment: 0 Encounter Discharge Information Items Post Procedure Vitals Discharge Condition: Stable Temperature (F): 98.6 Ambulatory Status: Wheelchair Pulse (bpm): 93 Discharge Destination: Home Respiratory Rate (breaths/min): 20 Transportation: Private Auto Blood Pressure (mmHg): 103/72 Accompanied By: daughter Schedule Follow-up Appointment: Yes Clinical Summary of Care: Electronic  Signature(s) Signed: 12/03/2021 4:35:20 PM By: Deon Pilling RN, BSN Entered By: Deon Pilling on 12/03/2021 11:26:42 -------------------------------------------------------------------------------- Lower Extremity Assessment Details Patient Name: Date of Service: [name] RLO TTE 12/03/2021 9:45 A M Medical Record Number: [id number] Patient Account Number: [id number] Date of Birth/Sex: Treating RN: 03/21/1946 (76 y.o. [name] Primary Care Sumaiya Arruda: Fonnie Jarvis Other Clinician: Referring Swara Donze: Treating Toiya Morrish/Extender: Aloha Gell Weeks in Treatment: 0 Electronic Signature(s) Signed: 12/03/2021 4:35:20 PM By: Deon Pilling RN, BSN Entered By: Deon Pilling on 12/03/2021 10:50:05 -------------------------------------------------------------------------------- Multi Wound Chart Details Patient Name: Date of Service: [name] RLO TTE 12/03/2021 9:45 A M Medical Record Number: [id number] Patient Account Number: [id number] Date of Birth/Sex: Treating RN: [birth year] (76 y.o. [name] Primary Care Kalan Yeley: Fonnie Jarvis Other Clinician: Referring Korah Hufstedler: Treating Basel Defalco/Extender: Dorann Ou in Treatment: 0 Vital Signs Height(in): 64 Pulse(bpm): 93 Weight(lbs): 110 Blood Pressure(mmHg): 103/72 Body Mass Index(BMI): 18.9 Temperature(F): 98.6 Respiratory Rate(breaths/min): 16 Photos: [N/A:N/A] Sacrum N/A N/A Wound Location: Pressure Injury N/A N/A Wounding Event: Pressure Ulcer N/A N/A Primary Etiology: Cataracts, Chronic sinus N/A N/A Comorbid History: problems/congestion, Middle ear problems, Anemia, Quadriplegia 11/02/2021 N/A N/A Date Acquired: 0 N/A N/A Weeks of Treatment: Open N/A N/A Wound Status: No N/A N/A Wound Recurrence: Yes N/A N/A Clustered Wound: 4 N/A N/A Clustered Quantity: 4x5x0.1 N/A N/A Measurements L x W x D (cm) 15.708 N/A N/A A (cm)  : rea 1.571 N/A N/A Volume (cm) : Category/Stage III N/A N/A Classification: Medium N/A N/A Exudate A mount: Serosanguineous N/A N/A Exudate Type: red, brown N/A N/A Exudate Color: Distinct, outline attached N/A N/A Wound Margin: Large (67-100%) N/A N/A Granulation A mount: Red, Pink, Pale N/A N/A Granulation Quality: None Present (0%) N/A N/A Necrotic A mount: Fat Layer (Subcutaneous Tissue): Yes N/A N/A Exposed Structures: Fascia: No Tendon: No Muscle: No Joint: No Bone: No Small (1-33%) N/A N/A Epithelialization: Chemical/Enzymatic/Mechanical N/A N/A Debridement: N/A N/A N/A Instrument: None N/A N/A Bleeding: Debridement Treatment Response: Procedure was tolerated well N/A N/A Post Debridement Measurements L x 4x5x0.1 N/A N/A W x D (cm) 1.571 N/A N/A Post Debridement Volume: (cm) Category/Stage III N/A N/A Post Debridement Stage: Debridement N/A N/A Procedures Performed: Treatment Notes Wound #1 (Sacrum) Cleanser Soap and Water Discharge Instruction:  May shower and wash wound with dial antibacterial soap and water prior to dressing change. Wound Cleanser Discharge Instruction: Cleanse the wound with wound cleanser prior to applying a clean dressing using gauze sponges, not tissue or cotton balls. Peri-Wound Care Skin Prep Discharge Instruction: Use skin prep as directed Zinc Oxide Ointment 30g tube Discharge Instruction: Apply Zinc Oxide to periwound with each dressing change Topical Primary Dressing MediHoney Gel, tube 1.5 (oz) Discharge Instruction: Apply to wound bed as instructed Secondary Dressing Woven Gauze Sponge, Non-Sterile 4x4 in Discharge Instruction: Apply over primary dressing as directed. Secured With Compression Wrap Compression Stockings Environmental education officer) Signed: 12/03/2021 12:18:02 PM By: Kalman Shan DO Signed: 12/03/2021 4:35:20 PM By: Deon Pilling RN, BSN Entered By: Kalman Shan on 12/03/2021  11:55:34 -------------------------------------------------------------------------------- Multi-Disciplinary Care Plan Details Patient Name: Date of Service: [name] RLO TTE 12/03/2021 9:45 A M Medical Record Number: [id number] Patient Account Number: [id number] Date of Birth/Sex: Treating RN: [birth year] (76 y.o. [name], [name] Primary Care Hassaan Crite: Fonnie Jarvis Other Clinician: Referring Mykael Batz: Treating Calder Oblinger/Extender: Dorann Ou in Treatment: 0 Active Inactive Nutrition Nursing Diagnoses: Potential for alteratiion in Nutrition/Potential for imbalanced nutrition Goals: Patient/caregiver agrees to and verbalizes understanding of need to obtain nutritional consultation Date Initiated: 12/03/2021 Target Resolution Date: 12/27/2021 Goal Status: Active Interventions: Assess patient nutrition upon admission and as needed per policy Provide education on nutrition Treatment Activities: Patient referred to Primary Care Physician for further nutritional evaluation : 12/03/2021 Notes: Orientation to the Wound Care Program Nursing Diagnoses: Knowledge deficit related to the wound healing center program Goals: Patient/caregiver will verbalize understanding of the [place] Program Date Initiated: 12/03/2021 Target Resolution Date: 12/28/2021 Goal Status: Active Interventions: Provide education on orientation to the wound center Notes: Pain, Acute or Chronic Nursing Diagnoses: Pain, acute or chronic: actual or potential Potential alteration in comfort, pain Goals: Patient will verbalize adequate pain control and receive pain control interventions during procedures as needed Date Initiated: 12/03/2021 Target Resolution Date: 12/28/2021 Goal Status: Active Patient/caregiver will verbalize comfort level met Date Initiated: 12/03/2021 Target Resolution Date: 12/27/2021 Goal Status: Active Interventions: Encourage patient to take pain  medications as prescribed Provide education on pain management Reposition patient for comfort Treatment Activities: Administer pain control measures as ordered : 12/03/2021 Notes: Pressure Nursing Diagnoses: Knowledge deficit related to management of pressures ulcers Potential for impaired tissue integrity related to pressure, friction, moisture, and shear Goals: Patient will remain free from development of additional pressure ulcers Date Initiated: 12/03/2021 Target Resolution Date: 12/28/2021 Goal Status: Active Interventions: Assess: immobility, friction, shearing, incontinence upon admission and as needed Assess offloading mechanisms upon admission and as needed Provide education on pressure ulcers Treatment Activities: Patient referred for seating evaluation to ensure proper offloading : 12/03/2021 T ordered outside of clinic : 12/03/2021 est Notes: Electronic Signature(s) Signed: 12/03/2021 4:35:20 PM By: Deon Pilling RN, BSN Entered By: Deon Pilling on 12/03/2021 10:53:34 -------------------------------------------------------------------------------- Pain Assessment Details Patient Name: Date of Service: [name] RLO TTE 12/03/2021 9:45 A M Medical Record Number: [id number] Patient Account Number: [id number] Date of Birth/Sex: Treating RN: 1945/07/19 (76 y.o. [name] Primary Care Anvi Mangal: Fonnie Jarvis Other Clinician: Referring Bransen Fassnacht: Treating Simrah Chatham/Extender: Dorann Ou in Treatment: 0 Active Problems Location of Pain Severity and Description of Pain Patient Has Paino Yes Site Locations Pain Location: Pain Location: Generalized Pain Rate the pain. Current Pain Level: 4 Pain Management and Medication Current Pain Management: Medication: No Cold Application: No Rest: No Massage: No  Activity: No T.E.N.S.: No Heat Application: No Leg drop or elevation: No Is the Current Pain Management Adequate:  Adequate How does your wound impact your activities of daily livingo Sleep: No Bathing: No Appetite: No Relationship With Others: No Bladder Continence: No Emotions: No Bowel Continence: No Work: No Toileting: No Drive: No Dressing: No Hobbies: No Notes left hip pain. Electronic Signature(s) Signed: 12/03/2021 4:35:20 PM By: Deon Pilling RN, BSN Entered By: Deon Pilling on 12/03/2021 10:50:22 -------------------------------------------------------------------------------- Patient/Caregiver Education Details Patient Name: Date of Service: [name] RLO TTE 8/14/2023andnbsp9:45 [place] Record Number: [id number] Patient Account Number: [id number] Date of Birth/Gender: Treating RN: 03-31-1946 (76 y.o. [name] Primary Care Physician: Fonnie Jarvis Other Clinician: Referring Physician: Treating Physician/Extender: Dorann Ou in Treatment: 0 Education Assessment Education Provided To: Patient Education Topics Provided Nutrition: Handouts: Nutrition Methods: Explain/Verbal Responses: Reinforcements needed Pressure: Handouts: Pressure Ulcers: Care and Offloading, Pressure Ulcers: Care and Offloading 2, Preventing Pressure Ulcers Methods: Explain/Verbal Responses: Reinforcements needed [place]: o Handouts: Welcome T [facility] o Methods: Explain/Verbal, Printed Responses: Reinforcements needed Electronic Signature(s) Signed: 12/03/2021 4:35:20 PM By: Deon Pilling RN, BSN Entered By: Deon Pilling on 12/03/2021 10:53:53 -------------------------------------------------------------------------------- Wound Assessment Details Patient Name: Date of Service: [name] RLO TTE 12/03/2021 9:45 A M Medical Record Number: [id number] Patient Account Number: [id number] Date of Birth/Sex: Treating RN: [birth year] (76 y.o. [name], [name] Primary Care Arieonna Medine: Fonnie Jarvis Other  Clinician: Referring Jaylen Knope: Treating Evania Lyne/Extender: Aloha Gell Weeks in Treatment: 0 Wound Status Wound Number: 1 Primary Pressure Ulcer Etiology: Wound Location: Sacrum Wound Open Wounding Event: Pressure Injury Status: Date Acquired: 11/02/2021 Comorbid Cataracts, Chronic sinus problems/congestion, Middle ear Weeks Of Treatment: 0 History: problems, Anemia, Quadriplegia Clustered Wound: Yes Photos Wound Measurements Length: (cm) 4 Width: (cm) 5 Depth: (cm) 0.1 Clustered Quantity: 4 Area: (cm) 15.708 Volume: (cm) 1.571 % Reduction in Area: % Reduction in Volume: Epithelialization: Small (1-33%) Tunneling: No Undermining: No Wound Description Classification: Category/Stage III Wound Margin: Distinct, outline attached Exudate Amount: Medium Exudate Type: Serosanguineous Exudate Color: red, brown Foul Odor After Cleansing: No Slough/Fibrino No Wound Bed Granulation Amount: Large (67-100%) Exposed Structure Granulation Quality: Red, Pink, Pale Fascia Exposed: No Necrotic Amount: None Present (0%) Fat Layer (Subcutaneous Tissue) Exposed: Yes Tendon Exposed: No Muscle Exposed: No Joint Exposed: No Bone Exposed: No Treatment Notes Wound #1 (Sacrum) Cleanser Soap and Water Discharge Instruction: May shower and wash wound with dial antibacterial soap and water prior to dressing change. Wound Cleanser Discharge Instruction: Cleanse the wound with wound cleanser prior to applying a clean dressing using gauze sponges, not tissue or cotton balls. Peri-Wound Care Skin Prep Discharge Instruction: Use skin prep as directed Zinc Oxide Ointment 30g tube Discharge Instruction: Apply Zinc Oxide to periwound with each dressing change Topical Primary Dressing MediHoney Gel, tube 1.5 (oz) Discharge Instruction: Apply to wound bed as instructed Secondary Dressing Woven Gauze Sponge, Non-Sterile 4x4 in Discharge Instruction: Apply over primary  dressing as directed. Secured With Compression Wrap Compression Stockings Environmental education officer) Signed: 12/03/2021 4:24:40 PM By: Rhae Hammock RN Entered By: Rhae Hammock on 12/03/2021 10:39:03 -------------------------------------------------------------------------------- Vitals Details Patient Name: Date of Service: [name] RLO TTE 12/03/2021 9:45 A M Medical Record Number: [id number] Patient Account Number: [id number] Date of Birth/Sex: Treating RN: 11-Aug-1945 (76 y.o. [name] Primary Care Latanja Lehenbauer: Fonnie Jarvis Other Clinician: Referring Malacai Grantz: Treating Briea Mcenery/Extender: Aloha Gell Weeks in Treatment: 0 Vital Signs Time  Taken: 10:25 Temperature (F): 98.6 Height (in): 64 Pulse (bpm): 93 Source: Stated Respiratory Rate (breaths/min): 16 Weight (lbs): 110 Blood Pressure (mmHg): 103/72 Source: Stated Reference Range: 80 - 120 mg / dl Body Mass Index (BMI): 18.9 Electronic Signature(s) Signed: 12/03/2021 3:10:00 PM By: Erenest Blank Entered By: Erenest Blank on 12/03/2021 10:25:58

## 2021-12-07 NOTE — Progress Notes (Signed)
[name], [name] ([id number]) Visit Report for 12/03/2021 Abuse Risk Screen Details Patient Name: Date of Service: [name], Erin RLO TTE 12/03/2021 9:45 A M Medical Record Number: [id number] Patient Account Number: [id number] Date of Birth/Sex: Treating RN: November 25, 1945 (76 y.o. [name], [name] Primary Care Dove Gresham: Aleatha Borer Other Clinician: Referring Riggin Cuttino: Treating Srihaan Mastrangelo/Extender: [name] in Treatment: 0 Abuse Risk Screen Items Answer ABUSE RISK SCREEN: Has anyone close to you tried to hurt or harm you recentlyo No Do you feel uncomfortable with anyone in your familyo No Has anyone forced you do things that you didnt want to doo No Electronic Signature(s) Signed: 12/03/2021 3:10:00 PM By: Thayer Dallas Signed: 12/03/2021 4:35:20 PM By: [name] RN, BSN Entered By: Thayer Dallas on 12/03/2021 10:52:03 -------------------------------------------------------------------------------- Activities of Daily Living Details Patient Name: Date of Service: [name] RLO TTE 12/03/2021 9:45 A M Medical Record Number: [id number] Patient Account Number: [id number] Date of Birth/Sex: Treating RN: 1945-07-05 (76 y.o. [name], [name] Primary Care Daniella Dewberry: Aleatha Borer Other Clinician: Referring Tabita Corbo: Treating Anirudh Baiz/Extender: [name] in Treatment: 0 Activities of Daily Living Items Answer Activities of Daily Living (Please select one for each item) Drive Automobile Not Able T Medications ake Need Assistance Use T elephone Need Assistance Care for Appearance Need Assistance Use T oilet Not Able Bath / Shower Not Able Dress Self Not Able Feed Self Not Able Walk Not Able Get In / Out Bed Not Able Housework Not Able Prepare Meals Not Able Handle Money Not Able Shop for Self Not Able Electronic Signature(s) Signed: 12/03/2021 4:35:20 PM By: [name] RN, BSN Entered By: [name] on  12/03/2021 10:49:27 -------------------------------------------------------------------------------- Education Screening Details Patient Name: Date of Service: [name] RLO TTE 12/03/2021 9:45 A M Medical Record Number: [id number] Patient Account Number: [id number] Date of Birth/Sex: Treating RN: 27-Aug-1945 (76 y.o. [name], [name] Primary Care [name]: Aleatha Borer Other Clinician: Referring Hondo Nanda: Treating Pyper Olexa/Extender: [name] in Treatment: 0 Primary Learner Assessed: Patient Learning Preferences/Education Level/Primary Language Learning Preference: Explanation, Demonstration, Printed Material Highest Education Level: College or Above Preferred Language: English Cognitive Barrier Language Barrier: No Translator Needed: No Memory Deficit: No Emotional Barrier: No Cultural/Religious Beliefs Affecting Medical Care: No Physical Barrier Impaired Vision: No Impaired Hearing: No Decreased Hand dexterity: No Knowledge/Comprehension Knowledge Level: High Comprehension Level: High Ability to understand written instructions: High Ability to understand verbal instructions: High Motivation Anxiety Level: Calm Cooperation: Cooperative Education Importance: Acknowledges Need Interest in Health Problems: Asks Questions Perception: Coherent Willingness to Engage in Self-Management High Activities: Readiness to Engage in Self-Management High Activities: Electronic Signature(s) Signed: 12/03/2021 4:35:20 PM By: [name] RN, BSN Entered By: [name] on 12/03/2021 10:49:45 -------------------------------------------------------------------------------- Fall Risk Assessment Details Patient Name: Date of Service: [name] RLO TTE 12/03/2021 9:45 A M Medical Record Number: [id number] Patient Account Number: [id number] Date of Birth/Sex: Treating RN: 16-Feb-1946 (76 y.o. [name], Millard.Loa Primary Care Hever Castilleja: Aleatha Borer Other Clinician: Referring Jaley Yan: Treating Talesha Ellithorpe/Extender: [name] in Treatment: 0 Fall Risk Assessment Items Have you had 2 or more falls in the last 12 monthso 0 No Have you had any fall that resulted in injury in the last 12 monthso 0 No FALLS RISK SCREEN History of falling - immediate or within 3 months 0 No Secondary diagnosis (Do you have 2 or more medical diagnoseso) 0 No Ambulatory aid None/bed rest/wheelchair/nurse 0 Yes Crutches/cane/walker 0 No Furniture 0 No Intravenous therapy Access/Saline/Heparin Lock 0 No  Gait/Transferring Normal/ bed rest/ wheelchair 0 Yes Weak (short steps with or without shuffle, stooped but able to lift head while walking, may seek 0 No support from furniture) Impaired (short steps with shuffle, may have difficulty arising from chair, head down, impaired 0 No balance) Mental Status Oriented to own ability 0 Yes Electronic Signature(s) Signed: 12/03/2021 3:10:00 PM By: Thayer Dallas Signed: 12/03/2021 4:35:20 PM By: [name] RN, BSN Entered By: Thayer Dallas on 12/03/2021 10:52:34 -------------------------------------------------------------------------------- Foot Assessment Details Patient Name: Date of Service: [name] RLO TTE 12/03/2021 9:45 A M Medical Record Number: [id number] Patient Account Number: [id number] Date of Birth/Sex: Treating RN: October 01, 1945 (76 y.o. [name] Primary Care Jyl Chico: Aleatha Borer Other Clinician: Referring Sarie Stall: Treating Novalee Horsfall/Extender: [name] in Treatment: 0 Foot Assessment Items Site Locations + = Sensation present, - = Sensation absent, C = Callus, U = Ulcer R = Redness, W = Warmth, M = Maceration, PU = Pre-ulcerative lesion F = Fissure, S = Swelling, D = Dryness Assessment Right: Left: Other Deformity: No No Prior Foot Ulcer: No No Prior Amputation: No No Charcot Joint: No  No Ambulatory Status: Gait: Notes quadplegic Electronic Signature(s) Signed: 12/03/2021 4:35:20 PM By: [name] RN, BSN Entered By: [name] on 12/03/2021 10:50:02 -------------------------------------------------------------------------------- Nutrition Risk Screening Details Patient Name: Date of Service: [name], [name] RLO TTE 12/03/2021 9:45 A M Medical Record Number: [id number] Patient Account Number: [id number] Date of Birth/Sex: Treating RN: [birth year] (76 y.o. [name], Millard.Loa Primary Care Bralynn Donado: Aleatha Borer Other Clinician: Referring Madi Bonfiglio: Treating Analy Bassford/Extender: [name] in Treatment: 0 Height (in): 64 Weight (lbs): 110 Body Mass Index (BMI): 18.9 Nutrition Risk Screening Items Score Screening NUTRITION RISK SCREEN: I have an illness or condition that made me change the kind and/or amount of food I eat 2 Yes I eat fewer than two meals per day 0 No I eat few fruits and vegetables, or milk products 0 No I have three or more drinks of beer, liquor or wine almost every day 0 No I have tooth or mouth problems that make it hard for me to eat 0 No I don't always have enough money to buy the food I need 0 No I eat alone most of the time 0 No I take three or more different prescribed or over-the-counter drugs a day 1 Yes Without wanting to, I have lost or gained 10 pounds in the last six months 0 No I am not always physically able to shop, cook and/or feed myself 0 No Nutrition Protocols Good Risk Protocol Moderate Risk Protocol 0 Provide education on nutrition High Risk Proctocol Risk Level: Moderate Risk Score: 3 Electronic Signature(s) Signed: 12/03/2021 4:35:20 PM By: [name] RN, BSN Entered By: [name] on 12/03/2021 10:49:54

## 2021-12-31 ENCOUNTER — Encounter (HOSPITAL_BASED_OUTPATIENT_CLINIC_OR_DEPARTMENT_OTHER): Payer: PPO | Admitting: Internal Medicine

## 2022-01-01 ENCOUNTER — Encounter (HOSPITAL_BASED_OUTPATIENT_CLINIC_OR_DEPARTMENT_OTHER): Payer: PPO | Attending: Internal Medicine | Admitting: Internal Medicine

## 2022-01-01 DIAGNOSIS — L89153 Pressure ulcer of sacral region, stage 3: Secondary | ICD-10-CM | POA: Diagnosis present

## 2022-01-01 DIAGNOSIS — I442 Atrioventricular block, complete: Secondary | ICD-10-CM | POA: Insufficient documentation

## 2022-01-01 DIAGNOSIS — G35 Multiple sclerosis: Secondary | ICD-10-CM | POA: Diagnosis not present

## 2022-01-01 DIAGNOSIS — G8250 Quadriplegia, unspecified: Secondary | ICD-10-CM | POA: Insufficient documentation

## 2022-01-01 DIAGNOSIS — Z950 Presence of cardiac pacemaker: Secondary | ICD-10-CM | POA: Insufficient documentation

## 2022-01-01 DIAGNOSIS — Z993 Dependence on wheelchair: Secondary | ICD-10-CM | POA: Diagnosis not present

## 2022-01-01 NOTE — Progress Notes (Signed)
[name], [name] ([id number]) Visit Report for 01/01/2022 Arrival Information Details Patient Name: Date of Service: [name], [name] [name] 01/01/2022 2:15 PM Medical Record Number: [id number] Patient Account Number: [id number] Date of Birth/Sex: Treating RN: 02-08-1946 (76 y.o. [name], [name] Primary Care Curley Fayette: Fonnie Jarvis Other Clinician: Referring Tabor Denham: Treating Gradie Butrick/Extender: Dorann Ou in Treatment: 4 Visit Information History Since Last Visit Added or deleted any medications: Yes Patient Arrived: Wheel Chair Any new allergies or adverse reactions: No Arrival Time: 14:50 Had a fall or experienced change in No Accompanied By: caregiver activities of daily living that may affect Transfer Assistance: Harrel Lemon Lift risk of falls: Patient Identification Verified: Yes Signs or symptoms of abuse/neglect since last visito No Secondary Verification Process Completed: Yes Hospitalized since last visit: No Patient Requires Transmission-Based Precautions: No Implantable device outside of the clinic excluding No Patient Has Alerts: No cellular tissue based products placed in the center since last visit: Pain Present Now: No Notes per patient abx for UTI and sinus infection. Electronic Signature(s) Signed: 01/01/2022 5:16:45 PM By: Deon Pilling RN, BSN Entered By: Deon Pilling on 01/01/2022 14:54:41 -------------------------------------------------------------------------------- Encounter Discharge Information Details Patient Name: Date of Service: [name] 01/01/2022 2:15 PM Medical Record Number: [id number] Patient Account Number: [id number] Date of Birth/Sex: Treating RN: Jan 05, 1946 (76 y.o. [name], [name] Primary Care Gaige Sebo: Fonnie Jarvis Other Clinician: Referring Koy Lamp: Treating Felica Chargois/Extender: Dorann Ou in Treatment: 4 Encounter Discharge Information Items Post Procedure  Vitals Discharge Condition: Stable Temperature (F): 98.5 Ambulatory Status: Wheelchair Pulse (bpm): 87 Discharge Destination: Home Respiratory Rate (breaths/min): 20 Transportation: Private Auto Blood Pressure (mmHg): 122/81 Accompanied By: caregiver Schedule Follow-up Appointment: Yes Clinical Summary of Care: Electronic Signature(s) Signed: 01/01/2022 5:16:45 PM By: Deon Pilling RN, BSN Entered By: Deon Pilling on 01/01/2022 17:02:22 -------------------------------------------------------------------------------- Lower Extremity Assessment Details Patient Name: Date of Service: [name] 01/01/2022 2:15 PM Medical Record Number: [id number] Patient Account Number: [id number] Date of Birth/Sex: Treating RN: 09/26/1945 (76 y.o. [name] Primary Care Valerio Pinard: Fonnie Jarvis Other Clinician: Referring Jossalin Chervenak: Treating Charlyn Vialpando/Extender: Dorann Ou in Treatment: 4 Electronic Signature(s) Signed: 01/01/2022 5:16:45 PM By: Deon Pilling RN, BSN Entered By: Deon Pilling on 01/01/2022 14:55:09 -------------------------------------------------------------------------------- Multi Wound Chart Details Patient Name: Date of Service: [name] 01/01/2022 2:15 PM Medical Record Number: [id number] Patient Account Number: [id number] Date of Birth/Sex: Treating RN: [birth year] (76 y.o. [name] Primary Care Roderic Lammert: Fonnie Jarvis Other Clinician: Referring Jaicion Laurie: Treating Nelson Julson/Extender: Dorann Ou in Treatment: 4 Vital Signs Height(in): 64 Pulse(bpm): 87 Weight(lbs): 110 Blood Pressure(mmHg): 122/81 Body Mass Index(BMI): 18.9 Temperature(F): 98.5 Respiratory Rate(breaths/min): 20 Photos: [N/A:N/A] Sacrum N/A N/A Wound Location: Pressure Injury N/A N/A Wounding Event: Pressure Ulcer N/A N/A Primary Etiology: Cataracts, Chronic sinus N/A N/A Comorbid  History: problems/congestion, Middle ear problems, Anemia, Asthma, Osteoarthritis, Quadriplegia 11/02/2021 N/A N/A Date Acquired: 4 N/A N/A Weeks of Treatment: Open N/A N/A Wound Status: No N/A N/A Wound Recurrence: Yes N/A N/A Clustered Wound: 2 N/A N/A Clustered Quantity: 4.4x4.2x0.1 N/A N/A Measurements L x W x D (cm) 14.514 N/A N/A A (cm) : rea 1.451 N/A N/A Volume (cm) : 7.60% N/A N/A % Reduction in A rea: 7.60% N/A N/A % Reduction in Volume: Unstageable/Unclassified N/A N/A Classification: Medium N/A N/A Exudate A mount: Serosanguineous N/A N/A Exudate Type: red, brown N/A N/A Exudate Color: Distinct, outline attached N/A N/A Wound Margin: None Present (0%) N/A N/A Granulation Amount: Large (67-100%)  N/A N/A Necrotic Amount: Fat Layer (Subcutaneous Tissue): Yes N/A N/A Exposed Structures: Fascia: No Tendon: No Muscle: No Joint: No Bone: No Small (1-33%) N/A N/A Epithelialization: Chemical/Enzymatic/Mechanical N/A N/A Debridement: N/A N/A N/A Instrument: None N/A N/A Bleeding: Debridement Treatment Response: Procedure was tolerated well N/A N/A Post Debridement Measurements L x 4.4x4.2x0.1 N/A N/A W x D (cm) 1.451 N/A N/A Post Debridement Volume: (cm) Unstageable/Unclassified N/A N/A Post Debridement Stage: Debridement N/A N/A Procedures Performed: Treatment Notes Electronic Signature(s) Signed: 01/01/2022 3:59:59 PM By: Kalman Shan DO Signed: 01/01/2022 5:16:45 PM By: Deon Pilling RN, BSN Entered By: Kalman Shan on 01/01/2022 15:29:33 -------------------------------------------------------------------------------- Multi-Disciplinary Care Plan Details Patient Name: Date of Service: [name] 01/01/2022 2:15 PM Medical Record Number: [id number] Patient Account Number: [id number] Date of Birth/Sex: Treating RN: 01-14-1946 (76 y.o. [name], Meta.Reding Primary Care Elexia Friedt: Fonnie Jarvis Other Clinician: Referring  Fadil Macmaster: Treating Nivin Braniff/Extender: Dorann Ou in Treatment: 4 Active Inactive Nutrition Nursing Diagnoses: Potential for alteratiion in Nutrition/Potential for imbalanced nutrition Goals: Patient/caregiver agrees to and verbalizes understanding of need to obtain nutritional consultation Date Initiated: 12/03/2021 Target Resolution Date: 12/27/2021 Goal Status: Active Interventions: Assess patient nutrition upon admission and as needed per policy Provide education on nutrition Treatment Activities: Education provided on Nutrition : 12/03/2021 Patient referred to Primary Care Physician for further nutritional evaluation : 12/03/2021 Notes: Pain, Acute or Chronic Nursing Diagnoses: Pain, acute or chronic: actual or potential Potential alteration in comfort, pain Goals: Patient will verbalize adequate pain control and receive pain control interventions during procedures as needed Date Initiated: 12/03/2021 Target Resolution Date: 12/28/2021 Goal Status: Active Patient/caregiver will verbalize comfort level met Date Initiated: 12/03/2021 Target Resolution Date: 12/27/2021 Goal Status: Active Interventions: Encourage patient to take pain medications as prescribed Provide education on pain management Reposition patient for comfort Treatment Activities: Administer pain control measures as ordered : 12/03/2021 Notes: Pressure Nursing Diagnoses: Knowledge deficit related to management of pressures ulcers Potential for impaired tissue integrity related to pressure, friction, moisture, and shear Goals: Patient will remain free from development of additional pressure ulcers Date Initiated: 12/03/2021 Target Resolution Date: 12/28/2021 Goal Status: Active Interventions: Assess: immobility, friction, shearing, incontinence upon admission and as needed Assess offloading mechanisms upon admission and as needed Provide education on pressure ulcers Treatment  Activities: Patient referred for seating evaluation to ensure proper offloading : 12/03/2021 T ordered outside of clinic : 12/03/2021 est Notes: Electronic Signature(s) Signed: 01/01/2022 5:16:45 PM By: Deon Pilling RN, BSN Entered By: Deon Pilling on 01/01/2022 15:30:26 -------------------------------------------------------------------------------- Pain Assessment Details Patient Name: Date of Service: [name] 01/01/2022 2:15 PM Medical Record Number: [id number] Patient Account Number: [id number] Date of Birth/Sex: Treating RN: 1946-01-16 (76 y.o. [name] Primary Care Darroll Bredeson: Fonnie Jarvis Other Clinician: Referring Montzerrat Brunell: Treating Ellieana Dolecki/Extender: Dorann Ou in Treatment: 4 Active Problems Location of Pain Severity and Description of Pain Patient Has Paino No Site Locations Rate the pain. Rate the pain. Current Pain Level: 0 Pain Management and Medication Current Pain Management: Medication: No Cold Application: No Rest: No Massage: No Activity: No T.E.N.S.: No Heat Application: No Leg drop or elevation: No Is the Current Pain Management Adequate: Adequate How does your wound impact your activities of daily livingo Sleep: No Bathing: No Appetite: No Relationship With Others: No Bladder Continence: No Emotions: No Bowel Continence: No Work: No Toileting: No Drive: No Dressing: No Hobbies: No Engineer, maintenance) Signed: 01/01/2022 5:16:45 PM By: Deon Pilling RN, BSN Entered By: Rolin Barry,  Bobbi on 01/01/2022 14:55:03 -------------------------------------------------------------------------------- Patient/Caregiver Education Details Patient Name: Date of Service: [name], [name] [name] 9/12/2023andnbsp2:15 PM Medical Record Number: [id number] Patient Account Number: [id number] Date of Birth/Gender: Treating RN: 02-15-1946 (76 y.o. [name] Primary Care Physician: Fonnie Jarvis Other  Clinician: Referring Physician: Treating Physician/Extender: Dorann Ou in Treatment: 4 Education Assessment Education Provided To: Patient Education Topics Provided Nutrition: Handouts: Nutrition Methods: Explain/Verbal Responses: Reinforcements needed Electronic Signature(s) Signed: 01/01/2022 5:16:45 PM By: Deon Pilling RN, BSN Entered By: Deon Pilling on 01/01/2022 17:01:23 -------------------------------------------------------------------------------- Wound Assessment Details Patient Name: Date of Service: [name] 01/01/2022 2:15 PM Medical Record Number: [id number] Patient Account Number: [id number] Date of Birth/Sex: Treating RN: May 05, 1945 (76 y.o. [name], Meta.Reding Primary Care Ervie Mccard: Fonnie Jarvis Other Clinician: Referring Otho Michalik: Treating Yolande Skoda/Extender: Aloha Gell Weeks in Treatment: 4 Wound Status Wound Number: 1 Primary Pressure Ulcer Etiology: Wound Location: Sacrum Wound Open Wounding Event: Pressure Injury Status: Date Acquired: 11/02/2021 Comorbid Cataracts, Chronic sinus problems/congestion, Middle ear Weeks Of Treatment: 4 History: problems, Anemia, Asthma, Osteoarthritis, Quadriplegia Clustered Wound: Yes Photos Wound Measurements Length: (cm) 4.4 Width: (cm) 4.2 Depth: (cm) 0.1 Clustered Quantity: 2 Area: (cm) 14.514 Volume: (cm) 1.451 % Reduction in Area: 7.6% % Reduction in Volume: 7.6% Epithelialization: Small (1-33%) Tunneling: No Undermining: No Wound Description Classification: Unstageable/Unclassified Wound Margin: Distinct, outline attached Exudate Amount: Medium Exudate Type: Serosanguineous Exudate Color: red, brown Foul Odor After Cleansing: No Slough/Fibrino Yes Wound Bed Granulation Amount: None Present (0%) Exposed Structure Necrotic Amount: Large (67-100%) Fascia Exposed: No Necrotic Quality: Adherent Slough Fat Layer (Subcutaneous  Tissue) Exposed: Yes Tendon Exposed: No Muscle Exposed: No Joint Exposed: No Bone Exposed: No Treatment Notes Wound #1 (Sacrum) Cleanser Soap and Water Discharge Instruction: May shower and wash wound with dial antibacterial soap and water prior to dressing change. Wound Cleanser Discharge Instruction: Cleanse the wound with wound cleanser prior to applying a clean dressing using gauze sponges, not tissue or cotton balls. Peri-Wound Care Skin Prep Discharge Instruction: Use skin prep as directed Zinc Oxide Ointment 30g tube Discharge Instruction: Apply Zinc Oxide to periwound with each dressing change Topical Primary Dressing Santyl Ointment Discharge Instruction: Apply nickel thick amount to wound bed as instructed Secondary Dressing Zetuvit Plus Silicone Border Dressing 5x5 (in/in) Discharge Instruction: Apply silicone border over primary dressing as directed. Secured With Compression Wrap Compression Stockings Environmental education officer) Signed: 01/01/2022 5:16:45 PM By: Deon Pilling RN, BSN Entered By: Deon Pilling on 01/01/2022 15:06:13 -------------------------------------------------------------------------------- Vitals Details Patient Name: Date of Service: [name] 01/01/2022 2:15 PM Medical Record Number: [id number] Patient Account Number: [id number] Date of Birth/Sex: Treating RN: 1945/09/26 (76 y.o. [name], Meta.Reding Primary Care Archibald Marchetta: Fonnie Jarvis Other Clinician: Referring Leola Fiore: Treating Meryle Pugmire/Extender: Dorann Ou in Treatment: 4 Vital Signs Time Taken: 14:50 Temperature (F): 98.5 Height (in): 64 Pulse (bpm): 87 Weight (lbs): 110 Respiratory Rate (breaths/min): 20 Body Mass Index (BMI): 18.9 Blood Pressure (mmHg): 122/81 Reference Range: 80 - 120 mg / dl Electronic Signature(s) Signed: 01/01/2022 5:16:45 PM By: Deon Pilling RN, BSN Entered By: Deon Pilling on 01/01/2022 14:54:53
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
## 2022-01-08 ENCOUNTER — Encounter (HOSPITAL_BASED_OUTPATIENT_CLINIC_OR_DEPARTMENT_OTHER): Payer: PPO | Admitting: Internal Medicine

## 2022-01-08 DIAGNOSIS — L89153 Pressure ulcer of sacral region, stage 3: Secondary | ICD-10-CM

## 2022-01-08 DIAGNOSIS — G8250 Quadriplegia, unspecified: Secondary | ICD-10-CM

## 2022-01-08 DIAGNOSIS — G35 Multiple sclerosis: Secondary | ICD-10-CM
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
## 2022-01-08 NOTE — Progress Notes (Signed)
FORESTINE, MACHO ([id number]) Visit Report for 01/08/2022 Arrival Information Details Patient Name: Date of Service: [name], [name] RLO TTE 01/08/2022 3:00 PM Medical Record Number: [id number] Patient Account Number: [id number] Date of Birth/Sex: Treating RN: [birth year] (76 y.o. [name], [name] Primary Care Kinney Sackmann: Fonnie Jarvis Other Clinician: Referring Ronen Bromwell: Treating Kodi Steil/Extender: Dorann Ou in Treatment: 5 Visit Information History Since Last Visit Added or deleted any medications: No Patient Arrived: Wheel Chair Any new allergies or adverse reactions: No Arrival Time: 15:08 Had a fall or experienced change in No Accompanied By: daughter activities of daily living that may affect Transfer Assistance: Harrel Lemon Lift risk of falls: Patient Identification Verified: Yes Signs or symptoms of abuse/neglect since last visito No Secondary Verification Process Completed: Yes Hospitalized since last visit: No Patient Requires Transmission-Based Precautions: No Implantable device outside of the clinic excluding No Patient Has Alerts: No cellular tissue based products placed in the center since last visit: Has Dressing in Place as Prescribed: Yes Pain Present Now: Yes Electronic Signature(s) Signed: 01/08/2022 5:29:48 PM By: Deon Pilling RN, BSN Entered By: Deon Pilling on 01/08/2022 15:09:10 -------------------------------------------------------------------------------- Encounter Discharge Information Details Patient Name: Date of Service: [name] RLO TTE 01/08/2022 3:00 PM Medical Record Number: [id number] Patient Account Number: [id number] Date of Birth/Sex: Treating RN: 1945-04-30 (76 y.o. [name], [name] Primary Care Nels Munn: Fonnie Jarvis Other Clinician: Referring Leeya Rusconi: Treating Lupe Bonner/Extender: Dorann Ou in Treatment: 5 Encounter Discharge Information Items Post Procedure  Vitals Discharge Condition: Stable Temperature (F): 99 Ambulatory Status: Wheelchair Pulse (bpm): 93 Discharge Destination: Home Respiratory Rate (breaths/min): 20 Transportation: Private Auto Blood Pressure (mmHg): 93/70 Accompanied By: family Schedule Follow-up Appointment: Yes Clinical Summary of Care: Electronic Signature(s) Signed: 01/08/2022 5:29:48 PM By: Deon Pilling RN, BSN Entered By: Deon Pilling on 01/08/2022 15:30:49 -------------------------------------------------------------------------------- Lower Extremity Assessment Details Patient Name: Date of Service: [name] RLO TTE 01/08/2022 3:00 PM Medical Record Number: [id number] Patient Account Number: [id number] Date of Birth/Sex: Treating RN: [birth year] (76 y.o. [name] Primary Care Marietta Sikkema: Fonnie Jarvis Other Clinician: Referring Jathniel Smeltzer: Treating Shanquita Ronning/Extender: Dorann Ou in Treatment: 5 Electronic Signature(s) Signed: 01/08/2022 5:29:48 PM By: Deon Pilling RN, BSN Entered By: Deon Pilling on 01/08/2022 15:09:35 -------------------------------------------------------------------------------- Multi Wound Chart Details Patient Name: Date of Service: [name] RLO TTE 01/08/2022 3:00 PM Medical Record Number: [id number] Patient Account Number: [id number] Date of Birth/Sex: Treating RN: 09-03-1945 (75 y.o. [name] Primary Care Nashira Mcglynn: Fonnie Jarvis Other Clinician: Referring Shikha Bibb: Treating Ruie Sendejo/Extender: Dorann Ou in Treatment: 5 Vital Signs Height(in): 64 Pulse(bpm): 93 Weight(lbs): 110 Blood Pressure(mmHg): 93/70 Body Mass Index(BMI): 18.9 Temperature(F): 99 Respiratory Rate(breaths/min): 16 Photos: [N/A:N/A] Sacrum N/A N/A Wound Location: Pressure Injury N/A N/A Wounding Event: Pressure Ulcer N/A N/A Primary Etiology: Cataracts, Chronic sinus N/A N/A Comorbid  History: problems/congestion, Middle ear problems, Anemia, Asthma, Osteoarthritis, Quadriplegia 11/02/2021 N/A N/A Date Acquired: 5 N/A N/A Weeks of Treatment: Open N/A N/A Wound Status: No N/A N/A Wound Recurrence: Yes N/A N/A Clustered Wound: 2 N/A N/A Clustered Quantity: 4x1.9x0.2 N/A N/A Measurements L x W x D (cm) 5.969 N/A N/A A (cm) : rea 1.194 N/A N/A Volume (cm) : 62.00% N/A N/A % Reduction in A rea: 24.00% N/A N/A % Reduction in Volume: Unstageable/Unclassified N/A N/A Classification: Medium N/A N/A Exudate A mount: Serosanguineous N/A N/A Exudate Type: red, brown N/A N/A Exudate Color: Distinct, outline attached N/A N/A Wound Margin: None Present (0%) N/A N/A Granulation Amount: Large (67-100%) N/A N/A  Necrotic Amount: Fat Layer (Subcutaneous Tissue): Yes N/A N/A Exposed Structures: Fascia: No Tendon: No Muscle: No Joint: No Bone: No Small (1-33%) N/A N/A Epithelialization: Chemical/Enzymatic/Mechanical N/A N/A Debridement: N/A N/A N/A Instrument: Minimum N/A N/A Bleeding: Debridement Treatment Response: Procedure was tolerated well N/A N/A Post Debridement Measurements L x 4x1.9x0.2 N/A N/A W x D (cm) 1.194 N/A N/A Post Debridement Volume: (cm) Unstageable/Unclassified N/A N/A Post Debridement Stage: Debridement N/A N/A Procedures Performed: Treatment Notes Electronic Signature(s) Signed: 01/08/2022 4:16:01 PM By: Kalman Shan DO Signed: 01/08/2022 5:29:48 PM By: Deon Pilling RN, BSN Entered By: Kalman Shan on 01/08/2022 15:30:07 -------------------------------------------------------------------------------- Multi-Disciplinary Care Plan Details Patient Name: Date of Service: [name] RLO TTE 01/08/2022 3:00 PM Medical Record Number: [id number] Patient Account Number: [id number] Date of Birth/Sex: Treating RN: 1946-01-29 (76 y.o. [name], [name] Primary Care Frederich Montilla: Fonnie Jarvis Other Clinician: Referring  Sanda Dejoy: Treating Roben Tatsch/Extender: Dorann Ou in Treatment: 5 Active Inactive Nutrition Nursing Diagnoses: Potential for alteratiion in Nutrition/Potential for imbalanced nutrition Goals: Patient/caregiver agrees to and verbalizes understanding of need to obtain nutritional consultation Date Initiated: 12/03/2021 Target Resolution Date: 01/18/2022 Goal Status: Active Interventions: Assess patient nutrition upon admission and as needed per policy Provide education on nutrition Treatment Activities: Education provided on Nutrition : 01/01/2022 Patient referred to Primary Care Physician for further nutritional evaluation : 12/03/2021 Notes: Pain, Acute or Chronic Nursing Diagnoses: Pain, acute or chronic: actual or potential Potential alteration in comfort, pain Goals: Patient will verbalize adequate pain control and receive pain control interventions during procedures as needed Date Initiated: 12/03/2021 Target Resolution Date: 01/18/2022 Goal Status: Active Patient/caregiver will verbalize comfort level met Date Initiated: 12/03/2021 Target Resolution Date: 01/18/2022 Goal Status: Active Interventions: Encourage patient to take pain medications as prescribed Provide education on pain management Reposition patient for comfort Treatment Activities: Administer pain control measures as ordered : 12/03/2021 Notes: Pressure Nursing Diagnoses: Knowledge deficit related to management of pressures ulcers Potential for impaired tissue integrity related to pressure, friction, moisture, and shear Goals: Patient will remain free from development of additional pressure ulcers Date Initiated: 12/03/2021 Target Resolution Date: 01/18/2022 Goal Status: Active Interventions: Assess: immobility, friction, shearing, incontinence upon admission and as needed Assess offloading mechanisms upon admission and as needed Provide education on pressure ulcers Treatment  Activities: Patient referred for seating evaluation to ensure proper offloading : 12/03/2021 T ordered outside of clinic : 12/03/2021 est Notes: Electronic Signature(s) Signed: 01/08/2022 5:29:48 PM By: Deon Pilling RN, BSN Entered By: Deon Pilling on 01/08/2022 15:26:22 -------------------------------------------------------------------------------- Pain Assessment Details Patient Name: Date of Service: [name] RLO TTE 01/08/2022 3:00 PM Medical Record Number: [id number] Patient Account Number: [id number] Date of Birth/Sex: Treating RN: Apr 17, 1946 (76 y.o. [name] Primary Care Chinelo Benn: Fonnie Jarvis Other Clinician: Referring Kella Splinter: Treating Margarita Bobrowski/Extender: Dorann Ou in Treatment: 5 Active Problems Location of Pain Severity and Description of Pain Patient Has Paino No Site Locations Rate the pain. Rate the pain. Current Pain Level: 0 Pain Management and Medication Current Pain Management: Medication: No Cold Application: No Rest: No Massage: No Activity: No T.E.N.S.: No Heat Application: No Leg drop or elevation: No Is the Current Pain Management Adequate: Adequate How does your wound impact your activities of daily livingo Sleep: No Bathing: No Appetite: No Relationship With Others: No Bladder Continence: No Emotions: No Bowel Continence: No Work: No Toileting: No Drive: No Dressing: No Hobbies: No Engineer, maintenance) Signed: 01/08/2022 5:29:48 PM By: Deon Pilling RN, BSN Entered By: Deon Pilling on  01/08/2022 15:09:31 -------------------------------------------------------------------------------- Patient/Caregiver Education Details Patient Name: Date of Service: [name], [name] RLO TTE 9/19/2023andnbsp3:00 PM Medical Record Number: [id number] Patient Account Number: [id number] Date of Birth/Gender: Treating RN: September 20, 1945 (76 y.o. [name] Primary Care Physician: Fonnie Jarvis Other  Clinician: Referring Physician: Treating Physician/Extender: Dorann Ou in Treatment: 5 Education Assessment Education Provided To: Patient Education Topics Provided Wound/Skin Impairment: Handouts: Skin Care Do's and Dont's Methods: Explain/Verbal Responses: Reinforcements needed Electronic Signature(s) Signed: 01/08/2022 5:29:48 PM By: Deon Pilling RN, BSN Entered By: Deon Pilling on 01/08/2022 15:26:45 -------------------------------------------------------------------------------- Wound Assessment Details Patient Name: Date of Service: [name] RLO TTE 01/08/2022 3:00 PM Medical Record Number: [id number] Patient Account Number: [id number] Date of Birth/Sex: Treating RN: [birth year] (76 y.o. [name], Meta.Reding Primary Care Duchess Armendarez: Fonnie Jarvis Other Clinician: Referring Kellan Boehlke: Treating Chaysen Tillman/Extender: Aloha Gell Weeks in Treatment: 5 Wound Status Wound Number: 1 Primary Pressure Ulcer Etiology: Wound Location: Sacrum Wound Open Wounding Event: Pressure Injury Status: Date Acquired: 11/02/2021 Comorbid Cataracts, Chronic sinus problems/congestion, Middle ear Weeks Of Treatment: 5 History: problems, Anemia, Asthma, Osteoarthritis, Quadriplegia Clustered Wound: Yes Photos Wound Measurements Length: (cm) 4 Width: (cm) 1.9 Depth: (cm) 0.2 Clustered Quantity: 2 Area: (cm) 5.969 Volume: (cm) 1.194 % Reduction in Area: 62% % Reduction in Volume: 24% Epithelialization: Small (1-33%) Tunneling: No Undermining: No Wound Description Classification: Unstageable/Unclassified Wound Margin: Distinct, outline attached Exudate Amount: Medium Exudate Type: Serosanguineous Exudate Color: red, brown Foul Odor After Cleansing: No Slough/Fibrino Yes Wound Bed Granulation Amount: None Present (0%) Exposed Structure Necrotic Amount: Large (67-100%) Fascia Exposed: No Fat Layer (Subcutaneous Tissue)  Exposed: Yes Tendon Exposed: No Muscle Exposed: No Joint Exposed: No Bone Exposed: No Treatment Notes Wound #1 (Sacrum) Cleanser Soap and Water Discharge Instruction: May shower and wash wound with dial antibacterial soap and water prior to dressing change. Wound Cleanser Discharge Instruction: Cleanse the wound with wound cleanser prior to applying a clean dressing using gauze sponges, not tissue or cotton balls. Peri-Wound Care Skin Prep Discharge Instruction: Use skin prep as directed Zinc Oxide Ointment 30g tube Discharge Instruction: Apply Zinc Oxide to periwound with each dressing change Topical Primary Dressing Hydrofera Blue Ready Foam, 4x5 in Discharge Instruction: Apply to wound bed as instructed Santyl Ointment Discharge Instruction: Apply nickel thick amount to wound bed as instructed Secondary Dressing Zetuvit Plus Silicone Border Dressing 5x5 (in/in) Discharge Instruction: Apply silicone border over primary dressing as directed. Secured With Compression Wrap Compression Stockings Environmental education officer) Signed: 01/08/2022 5:29:48 PM By: Deon Pilling RN, BSN Entered By: Deon Pilling on 01/08/2022 15:16:46 -------------------------------------------------------------------------------- Vitals Details Patient Name: Date of Service: [name] RLO TTE 01/08/2022 3:00 PM Medical Record Number: [id number] Patient Account Number: [id number] Date of Birth/Sex: Treating RN: 04/18/1946 (76 y.o. [name], Meta.Reding Primary Care Fremon Zacharia: Fonnie Jarvis Other Clinician: Referring Jocob Dambach: Treating Devita Nies/Extender: Dorann Ou in Treatment: 5 Vital Signs Time Taken: 15:05 Temperature (F): 99 Height (in): 64 Pulse (bpm): 93 Weight (lbs): 110 Respiratory Rate (breaths/min): 16 Body Mass Index (BMI): 18.9 Blood Pressure (mmHg): 93/70 Reference Range: 80 - 120 mg / dl Electronic Signature(s) Signed: 01/08/2022 5:29:48 PM  By: Deon Pilling RN, BSN Entered By: Deon Pilling on 01/08/2022 15:09:25

## 2022-01-15 ENCOUNTER — Telehealth: Payer: Self-pay

## 2022-01-15 ENCOUNTER — Encounter (HOSPITAL_BASED_OUTPATIENT_CLINIC_OR_DEPARTMENT_OTHER): Payer: PPO | Admitting: Internal Medicine

## 2022-01-15 DIAGNOSIS — G35 Multiple sclerosis: Secondary | ICD-10-CM

## 2022-01-15 DIAGNOSIS — G8250 Quadriplegia, unspecified: Secondary | ICD-10-CM

## 2022-01-15 DIAGNOSIS — L89153 Pressure ulcer of sacral region, stage 3: Secondary | ICD-10-CM

## 2022-01-15 DIAGNOSIS — Z950 Presence of cardiac pacemaker: Secondary | ICD-10-CM | POA: Diagnosis not present

## 2022-01-15 NOTE — Progress Notes (Signed)
JACQUE, BYRON ([id number]) Visit Report for 01/15/2022 Arrival Information Details Patient Name: Date of Service: VELIA, PAMER RLO TTE 01/15/2022 2:45 PM Medical Record Number: [id number] Patient Account Number: [id number] Date of Birth/Sex: Treating RN: 1945-08-05 (76 y.o. [name], [name] Primary Care Derian Pfost: Fonnie Jarvis Other Clinician: Referring Noah Lembke: Treating Ritik Stavola/Extender: Dorann Ou in Treatment: 6 Visit Information History Since Last Visit Added or deleted any medications: No Patient Arrived: Wheel Chair Any new allergies or adverse reactions: No Arrival Time: 15:12 Had a fall or experienced change in No Accompanied By: caregiver activities of daily living that may affect Transfer Assistance: Harrel Lemon Lift risk of falls: Patient Identification Verified: Yes Signs or symptoms of abuse/neglect since last visito No Secondary Verification Process Completed: Yes Hospitalized since last visit: No Patient Requires Transmission-Based Precautions: No Implantable device outside of the clinic excluding No Patient Has Alerts: No cellular tissue based products placed in the center since last visit: Has Dressing in Place as Prescribed: Yes Pain Present Now: Yes Electronic Signature(s) Signed: 01/15/2022 5:27:41 PM By: Deon Pilling RN, BSN Entered By: Deon Pilling on 01/15/2022 15:12:31 -------------------------------------------------------------------------------- Encounter Discharge Information Details Patient Name: Date of Service: [name] RLO TTE 01/15/2022 2:45 PM Medical Record Number: [id number] Patient Account Number: [id number] Date of Birth/Sex: Treating RN: 1946-02-19 (76 y.o. [name], [name] Primary Care Sheilah Rayos: Fonnie Jarvis Other Clinician: Referring Dali Kraner: Treating Normand Damron/Extender: Dorann Ou in Treatment: 6 Encounter Discharge Information Items Post Procedure  Vitals Discharge Condition: Stable Temperature (F): 98.5 Ambulatory Status: Wheelchair Pulse (bpm): 85 Discharge Destination: Home Respiratory Rate (breaths/min): 20 Transportation: Private Auto Blood Pressure (mmHg): 91/68 Accompanied By: caregiver Schedule Follow-up Appointment: Yes Clinical Summary of Care: Electronic Signature(s) Signed: 01/15/2022 5:27:41 PM By: Deon Pilling RN, BSN Entered By: Deon Pilling on 01/15/2022 16:35:59 -------------------------------------------------------------------------------- Lower Extremity Assessment Details Patient Name: Date of Service: [name] RLO TTE 01/15/2022 2:45 PM Medical Record Number: [id number] Patient Account Number: [id number] Date of Birth/Sex: Treating RN: 1945-09-28 (76 y.o. [name] Primary Care Yohance Hathorne: Fonnie Jarvis Other Clinician: Referring Kripa Foskey: Treating Peja Allender/Extender: Dorann Ou in Treatment: 6 Electronic Signature(s) Signed: 01/15/2022 5:27:41 PM By: Deon Pilling RN, BSN Entered By: Deon Pilling on 01/15/2022 15:13:03 -------------------------------------------------------------------------------- Multi Wound Chart Details Patient Name: Date of Service: [name] RLO TTE 01/15/2022 2:45 PM Medical Record Number: [id number] Patient Account Number: [id number] Date of Birth/Sex: Treating RN: 1945/12/10 (76 y.o. F) Primary Care Kell Ferris: Fonnie Jarvis Other Clinician: Referring Bryona Foxworthy: Treating Lucrezia Dehne/Extender: Dorann Ou in Treatment: 6 Vital Signs Height(in): 64 Pulse(bpm): 88 Weight(lbs): 110 Blood Pressure(mmHg): 91/68 Body Mass Index(BMI): 18.9 Temperature(F): 98.5 Respiratory Rate(breaths/min): 16 Photos: [N/A:N/A] Sacrum N/A N/A Wound Location: Pressure Injury N/A N/A Wounding Event: Pressure Ulcer N/A N/A Primary Etiology: Cataracts, Chronic sinus N/A N/A Comorbid  History: problems/congestion, Middle ear problems, Anemia, Asthma, Osteoarthritis, Quadriplegia 11/02/2021 N/A N/A Date Acquired: 6 N/A N/A Weeks of Treatment: Open N/A N/A Wound Status: No N/A N/A Wound Recurrence: Yes N/A N/A Clustered Wound: 1 N/A N/A Clustered Quantity: 2.8x1.2x0.1 N/A N/A Measurements L x W x D (cm) 2.639 N/A N/A A (cm) : rea 0.264 N/A N/A Volume (cm) : 83.20% N/A N/A % Reduction in A rea: 83.20% N/A N/A % Reduction in Volume: Category/Stage III N/A N/A Classification: Medium N/A N/A Exudate A mount: Serosanguineous N/A N/A Exudate Type: red, brown N/A N/A Exudate Color: Distinct, outline attached N/A N/A Wound Margin: Medium (34-66%) N/A N/A Granulation Amount: Red, Pink N/A N/A Granulation Quality:  Medium (34-66%) N/A N/A Necrotic Amount: Fat Layer (Subcutaneous Tissue): Yes N/A N/A Exposed Structures: Fascia: No Tendon: No Muscle: No Joint: No Bone: No Large (67-100%) N/A N/A Epithelialization: Treatment Notes Electronic Signature(s) Signed: 01/15/2022 4:43:30 PM By: Hoffman, Jessica DO Entered By: Hoffman, Jessica on 01/15/2022 16:07:46 -------------------------------------------------------------------------------- Multi-Disciplinary Care Plan Details Patient Name: Date of Service: Oyen, CHA RLO TTE 01/15/2022 2:45 PM Medical Record Number: [id number] Patient Account Number: [id number] Date of Birth/Sex: Treating RN: 02/27/1946 (76 y.o. F) Deaton, Bobbi Primary Care Provider: Falstreau, Brooke Other Clinician: Referring Provider: Treating Provider/Extender: Hoffman, Jessica Falstreau, Brooke Weeks in Treatment: 6 Active Inactive Nutrition Nursing Diagnoses: Potential for alteratiion in Nutrition/Potential for imbalanced nutrition Goals: Patient/caregiver agrees to and verbalizes understanding of need to obtain nutritional consultation Date Initiated: 12/03/2021 Target Resolution Date: 02/15/2022 Goal Status:  Active Interventions: Assess patient nutrition upon admission and as needed per policy Provide education on nutrition Treatment Activities: Education provided on Nutrition : 12/03/2021 Patient referred to Primary Care Physician for further nutritional evaluation : 12/03/2021 Notes: Pain, Acute or Chronic Nursing Diagnoses: Pain, acute or chronic: actual or potential Potential alteration in comfort, pain Goals: Patient will verbalize adequate pain control and receive pain control interventions during procedures as needed Date Initiated: 12/03/2021 Target Resolution Date: 02/15/2022 Goal Status: Active Patient/caregiver will verbalize comfort level met Date Initiated: 12/03/2021 Target Resolution Date: 02/15/2022 Goal Status: Active Interventions: Encourage patient to take pain medications as prescribed Provide education on pain management Reposition patient for comfort Treatment Activities: Administer pain control measures as ordered : 12/03/2021 Notes: Pressure Nursing Diagnoses: Knowledge deficit related to management of pressures ulcers Potential for impaired tissue integrity related to pressure, friction, moisture, and shear Goals: Patient will remain free from development of additional pressure ulcers Date Initiated: 12/03/2021 Target Resolution Date: 02/15/2022 Goal Status: Active Interventions: Assess: immobility, friction, shearing, incontinence upon admission and as needed Assess offloading mechanisms upon admission and as needed Provide education on pressure ulcers Treatment Activities: Patient referred for seating evaluation to ensure proper offloading : 12/03/2021 T ordered outside of clinic : 12/03/2021 est Notes: Electronic Signature(s) Signed: 01/15/2022 5:27:41 PM By: Deaton, Bobbi RN, BSN Entered By: Deaton, Bobbi on 01/15/2022 15:25:16 -------------------------------------------------------------------------------- Pain Assessment Details Patient Name: Date  of Service: Stults, CHA RLO TTE 01/15/2022 2:45 PM Medical Record Number: [id number] Patient Account Number: [id number] Date of Birth/Sex: Treating RN: 05/04/1945 (76 y.o. F) Deaton, Bobbi Primary Care Provider: Falstreau, Brooke Other Clinician: Referring Provider: Treating Provider/Extender: Hoffman, Jessica Falstreau, Brooke Weeks in Treatment: 6 Active Problems Location of Pain Severity and Description of Pain Patient Has Paino Yes Site Locations Pain Location: Generalized Pain, Pain in Ulcers Rate the pain. Current Pain Level: 7 Pain Management and Medication Current Pain Management: Medication: No Cold Application: No Rest: No Massage: No Activity: No T.E.N.S.: No Heat Application: No Leg drop or elevation: No Is the Current Pain Management Adequate: Adequate How does your wound impact your activities of daily livingo Sleep: No Bathing: No Appetite: No Relationship With Others: No Bladder Continence: No Emotions: No Bowel Continence: No Work: No Toileting: No Drive: No Dressing: No Hobbies: No Electronic Signature(s) Signed: 01/15/2022 5:27:41 PM By: Deaton, Bobbi RN, BSN Entered By: Deaton, Bobbi on 01/15/2022 15:12:59 -------------------------------------------------------------------------------- Patient/Caregiver Education Details Patient Name: Date of Service: Comacho, CHA RLO TTE 9/26/2023andnbsp2:45 PM Medical Record Number: [id number] Patient Account Number: [id number] Date of Birth/Gender: Treating RN: 06/08/1945 (76 y.o. F) Deaton, Bobbi Primary Care Physician: Falstreau, Brooke Other Clinician: Referring Physician: Treating Physician/Extender: Hoffman, Jessica Falstreau, Brooke Weeks   in Treatment: 6 Education Assessment Education Provided To: Patient Education Topics Provided Pressure: Handouts: Pressure Ulcers: Care and Offloading, Pressure Ulcers: Care and Offloading 2, Preventing Pressure Ulcers Methods: Explain/Verbal Responses:  Reinforcements needed Electronic Signature(s) Signed: 01/15/2022 5:27:41 PM By: Deon Pilling RN, BSN Entered By: Deon Pilling on 01/15/2022 15:25:28 -------------------------------------------------------------------------------- Wound Assessment Details Patient Name: Date of Service: [name] RLO TTE 01/15/2022 2:45 PM Medical Record Number: [id number] Patient Account Number: [id number] Date of Birth/Sex: Treating RN: [birth year] (76 y.o. [name], Meta.Reding Primary Care Renaldo Gornick: Fonnie Jarvis Other Clinician: Referring Fitzgerald Dunne: Treating Cem Kosman/Extender: Dorann Ou in Treatment: 6 Wound Status Wound Number: 1 Primary Pressure Ulcer Etiology: Wound Location: Sacrum Wound Open Wounding Event: Pressure Injury Status: Date Acquired: 11/02/2021 Date Acquired: 11/02/2021 Comorbid Cataracts, Chronic sinus problems/congestion, Middle ear Weeks Of Treatment: 6 History: problems, Anemia, Asthma, Osteoarthritis, Quadriplegia Clustered Wound: Yes Photos Wound Measurements Length: (cm) 2.8 Width: (cm) 1.2 Depth: (cm) 0.1 Clustered Quantity: 1 Area: (cm) 2.639 Volume: (cm) 0.264 % Reduction in Area: 83.2% % Reduction in Volume: 83.2% Epithelialization: Large (67-100%) Tunneling: No Undermining: No Wound Description Classification: Category/Stage III Wound Margin: Distinct, outline attached Exudate Amount: Medium Exudate Type: Serosanguineous Exudate Color: red, brown Foul Odor After Cleansing: No Slough/Fibrino Yes Wound Bed Granulation Amount: Medium (34-66%) Exposed Structure Granulation Quality: Red, Pink Fascia Exposed: No Necrotic Amount: Medium (34-66%) Fat Layer (Subcutaneous Tissue) Exposed: Yes Necrotic Quality: Adherent Slough Tendon Exposed: No Muscle Exposed: No Joint Exposed: No Bone Exposed: No Treatment Notes Wound #1 (Sacrum) Cleanser Soap and Water Discharge Instruction: May shower and wash wound with dial  antibacterial soap and water prior to dressing change. Wound Cleanser Discharge Instruction: Cleanse the wound with wound cleanser prior to applying a clean dressing using gauze sponges, not tissue or cotton balls. Peri-Wound Care Skin Prep Discharge Instruction: Use skin prep as directed Zinc Oxide Ointment 30g tube Discharge Instruction: Apply Zinc Oxide to periwound with each dressing change Topical Primary Dressing Hydrofera Blue Ready Foam, 4x5 in Discharge Instruction: Apply to wound bed as instructed Santyl Ointment Discharge Instruction: Apply nickel thick amount to wound bed as instructed Secondary Dressing Zetuvit Plus Silicone Border Dressing 5x5 (in/in) Discharge Instruction: Apply silicone border over primary dressing as directed. Secured With Compression Wrap Compression Stockings Environmental education officer) Signed: 01/15/2022 4:47:31 PM By: Rhae Hammock RN Signed: 01/15/2022 5:27:41 PM By: Deon Pilling RN, BSN Entered By: Rhae Hammock on 01/15/2022 15:20:35 -------------------------------------------------------------------------------- Vitals Details Patient Name: Date of Service: [name] RLO TTE 01/15/2022 2:45 PM Medical Record Number: [id number] Patient Account Number: [id number] Date of Birth/Sex: Treating RN: June 05, 1945 (76 y.o. [name], Meta.Reding Primary Care Avyanna Spada: Fonnie Jarvis Other Clinician: Referring Anahy Esh: Treating Maybree Riling/Extender: Dorann Ou in Treatment: 6 Vital Signs Time Taken: 15:10 Temperature (F): 98.5 Height (in): 64 Pulse (bpm): 88 Weight (lbs): 110 Respiratory Rate (breaths/min): 16 Body Mass Index (BMI): 18.9 Blood Pressure (mmHg): 91/68 Reference Range: 80 - 120 mg / dl Electronic Signature(s) Signed: 01/15/2022 5:27:41 PM By: Deon Pilling RN, BSN Entered By: Deon Pilling on 01/15/2022 15:12:44

## 2022-01-15 NOTE — Telephone Encounter (Signed)
Patient's insurance coverage of botox has not been completed for this year. Please complete PA as needed.  

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
## 2022-01-22 NOTE — Telephone Encounter (Signed)
Submitted benefits investigation via [employer] portal   Key # [id number]

## 2022-01-25 NOTE — Telephone Encounter (Signed)
Buy and Bill does not require Prior Authorization 

## 2022-01-28 ENCOUNTER — Ambulatory Visit (INDEPENDENT_AMBULATORY_CARE_PROVIDER_SITE_OTHER): Payer: PPO | Admitting: Neurology

## 2022-01-28 ENCOUNTER — Encounter: Payer: Self-pay | Admitting: Neurology

## 2022-01-28 VITALS — BP 92/62 | HR 93

## 2022-01-28 DIAGNOSIS — G8250 Quadriplegia, unspecified: Secondary | ICD-10-CM

## 2022-01-28 NOTE — Progress Notes (Addendum)
[employer] 100 x 4 vials  [id number] Exp-11-2023 B/B
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
## 2022-01-31 ENCOUNTER — Encounter (HOSPITAL_BASED_OUTPATIENT_CLINIC_OR_DEPARTMENT_OTHER): Payer: PPO | Attending: Internal Medicine | Admitting: Internal Medicine

## 2022-01-31 DIAGNOSIS — G8250 Quadriplegia, unspecified: Secondary | ICD-10-CM | POA: Diagnosis not present

## 2022-01-31 DIAGNOSIS — G35 Multiple sclerosis: Secondary | ICD-10-CM | POA: Insufficient documentation

## 2022-01-31 DIAGNOSIS — Z950 Presence of cardiac pacemaker: Secondary | ICD-10-CM | POA: Insufficient documentation

## 2022-01-31 DIAGNOSIS — I442 Atrioventricular block, complete: Secondary | ICD-10-CM | POA: Diagnosis not present

## 2022-01-31 DIAGNOSIS — L89153 Pressure ulcer of sacral region, stage 3: Secondary | ICD-10-CM | POA: Insufficient documentation

## 2022-01-31 NOTE — Progress Notes (Signed)
[name] ([id number]) [id number] Page 1 of 7 Visit Report for 01/31/2022 Arrival Information Details Patient Name: Date of Service: [name], [name] 01/31/2022 2:15 PM Medical Record Number: [id number] Patient Account Number: [id number] Date of Birth/Sex: Treating RN: April 19, 1946 (76 y.o. F) Primary Care Johngabriel Verde: Fonnie Jarvis Other Clinician: Referring Pierre Dellarocco: Treating Candi Profit/Extender: Dorann Ou in Treatment: 8 Visit Information History Since Last Visit Added or deleted any medications: No Patient Arrived: Wheel Chair Any new allergies or adverse reactions: No Arrival Time: 14:39 Had a fall or experienced change in No Accompanied By: caregiver activities of daily living that may affect Transfer Assistance: Harrel Lemon Lift risk of falls: Patient Identification Verified: Yes Signs or symptoms of abuse/neglect since last visito No Secondary Verification Process Completed: Yes Hospitalized since last visit: No Patient Requires Transmission-Based Precautions: No Implantable device outside of the clinic excluding No Patient Has Alerts: No cellular tissue based products placed in the center since last visit: Has Dressing in Place as Prescribed: Yes Pain Present Now: Yes Electronic Signature(s) Signed: 01/31/2022 4:31:24 PM By: Erenest Blank Entered By: Erenest Blank on 01/31/2022 14:40:08 -------------------------------------------------------------------------------- Encounter Discharge Information Details Patient Name: Date of Service: [name] 01/31/2022 2:15 PM Medical Record Number: [id number] Patient Account Number: [id number] Date of Birth/Sex: Treating RN: 05-26-1945 (76 y.o. [name] Primary Care Tyger Wichman: Fonnie Jarvis Other Clinician: Referring Elver Stadler: Treating Caedon Bond/Extender: Dorann Ou in Treatment: 8 Encounter Discharge Information  Items Post Procedure Vitals Discharge Condition: Stable Temperature (F): 98.2 Ambulatory Status: Wheelchair Pulse (bpm): 91 Discharge Destination: Home Respiratory Rate (breaths/min): 16 Transportation: Private Auto Blood Pressure (mmHg): 86/64 Accompanied By: caregiver Schedule Follow-up Appointment: Yes Clinical Summary of Care: Electronic Signature(s) Signed: 01/31/2022 4:52:38 PM By: Deon Pilling RN, BSN Entered By: Deon Pilling on 01/31/2022 15:55:40 Marlaine Hind ([id number]) [id number].pdf Page 2 of 7 -------------------------------------------------------------------------------- Lower Extremity Assessment Details Patient Name: Date of Service: [name] 01/31/2022 2:15 PM Medical Record Number: [id number] Patient Account Number: [id number] Date of Birth/Sex: Treating RN: [birth year] (76 y.o. F) Primary Care Shondale Quinley: Fonnie Jarvis Other Clinician: Referring Efraim Vanallen: Treating Hillary Struss/Extender: Dorann Ou in Treatment: 8 Electronic Signature(s) Signed: 01/31/2022 4:31:24 PM By: Erenest Blank Entered By: Erenest Blank on 01/31/2022 14:40:51 -------------------------------------------------------------------------------- Multi Wound Chart Details Patient Name: Date of Service: [name] 01/31/2022 2:15 PM Medical Record Number: [id number] Patient Account Number: [id number] Date of Birth/Sex: Treating RN: 1945-11-08 (76 y.o. F) Primary Care Kirby Cortese: Fonnie Jarvis Other Clinician: Referring Kemiya Batdorf: Treating Chudney Scheffler/Extender: Dorann Ou in Treatment: 8 Vital Signs Height(in): 64 Pulse(bpm): 91 Weight(lbs): 110 Blood Pressure(mmHg): 86/64 Body Mass Index(BMI): 18.9 Temperature(F): 98.2 Respiratory Rate(breaths/min): 16 [1:Photos:] [N/A:N/A] Sacrum N/A N/A Wound Location: Pressure Injury N/A N/A Wounding Event: Pressure Ulcer N/A  N/A Primary Etiology: Cataracts, Chronic sinus N/A N/A Comorbid History: problems/congestion, Middle ear problems, Anemia, Asthma, Osteoarthritis, Quadriplegia 11/02/2021 N/A N/A Date Acquired: 8 N/A N/A Weeks of Treatment: Open N/A N/A Wound Status: No N/A N/A Wound Recurrence: Yes N/A N/A Clustered Wound: 1 N/A N/A Clustered Quantity: 2x0.9x0.1 N/A N/A Measurements L x W x D (cm) 1.414 N/A N/A A (cm) : rea 0.141 N/A N/A Volume (cm) : 91.00% N/A N/A % Reduction in A rea: 91.00% N/A N/A % Reduction in Volume: Category/Stage III N/A N/A Classification: Medium N/A N/A Exudate A mount: Serosanguineous N/A N/A Exudate Type: red, brown N/A N/A Exudate ColorJACQUALIN, SHIRKEY ([id number]) [phone].pdf Page 3 of 7 Distinct, outline attached N/A N/A Wound  Margin: Large (67-100%) N/A N/A Granulation Amount: Red, Pink N/A N/A Granulation Quality: None Present (0%) N/A N/A Necrotic Amount: Fat Layer (Subcutaneous Tissue): Yes N/A N/A Exposed Structures: Fascia: No Tendon: No Muscle: No Joint: No Bone: No Large (67-100%) N/A N/A Epithelialization: Excoriation: No N/A N/A Periwound Skin Texture: Induration: No Callus: No Crepitus: No Rash: No Scarring: No Maceration: No N/A N/A Periwound Skin Moisture: Dry/Scaly: No Atrophie Blanche: No N/A N/A Periwound Skin Color: Cyanosis: No Ecchymosis: No Erythema: No Hemosiderin Staining: No Mottled: No Pallor: No Rubor: No Treatment Notes Electronic Signature(s) Signed: 01/31/2022 4:46:31 PM By: Kalman Shan DO Entered By: Kalman Shan on 01/31/2022 15:14:44 -------------------------------------------------------------------------------- Multi-Disciplinary Care Plan Details Patient Name: Date of Service: [name] 01/31/2022 2:15 PM Medical Record Number: [id number] Patient Account Number: [id number] Date of Birth/Sex: Treating RN: [birth year] (76 y.o. [name],  [name] Primary Care Nikko Goldwire: Fonnie Jarvis Other Clinician: Referring Wlliam Grosso: Treating Shantina Chronister/Extender: Dorann Ou in Treatment: 8 Active Inactive Nutrition Nursing Diagnoses: Potential for alteratiion in Nutrition/Potential for imbalanced nutrition Goals: Patient/caregiver agrees to and verbalizes understanding of need to obtain nutritional consultation Date Initiated: 12/03/2021 Target Resolution Date: 02/15/2022 Goal Status: Active Interventions: Assess patient nutrition upon admission and as needed per policy Provide education on nutrition Treatment Activities: Education provided on Nutrition : 01/01/2022 Patient referred to Primary Care Physician for further nutritional evaluation : 12/03/2021 Notes: Pain, Acute or Chronic Nursing Diagnoses: Pain, acute or chronic: actual or potential MIYA, LUVIANO ([id number]) [phone].pdf Page 4 of 7 Potential alteration in comfort, pain Goals: Patient will verbalize adequate pain control and receive pain control interventions during procedures as needed Date Initiated: 12/03/2021 Target Resolution Date: 02/15/2022 Goal Status: Active Patient/caregiver will verbalize comfort level met Date Initiated: 12/03/2021 Target Resolution Date: 02/15/2022 Goal Status: Active Interventions: Encourage patient to take pain medications as prescribed Provide education on pain management Reposition patient for comfort Treatment Activities: Administer pain control measures as ordered : 12/03/2021 Notes: Pressure Nursing Diagnoses: Knowledge deficit related to management of pressures ulcers Potential for impaired tissue integrity related to pressure, friction, moisture, and shear Goals: Patient will remain free from development of additional pressure ulcers Date Initiated: 12/03/2021 Target Resolution Date: 02/15/2022 Goal Status: Active Interventions: Assess: immobility, friction,  shearing, incontinence upon admission and as needed Assess offloading mechanisms upon admission and as needed Provide education on pressure ulcers Treatment Activities: Patient referred for seating evaluation to ensure proper offloading : 12/03/2021 T ordered outside of clinic : 12/03/2021 est Notes: Electronic Signature(s) Signed: 01/31/2022 4:52:38 PM By: Deon Pilling RN, BSN Entered By: Deon Pilling on 01/31/2022 15:54:29 -------------------------------------------------------------------------------- Pain Assessment Details Patient Name: Date of Service: [name] 01/31/2022 2:15 PM Medical Record Number: [id number] Patient Account Number: [id number] Date of Birth/Sex: Treating RN: [birth year] (76 y.o. F) Primary Care Criston Chancellor: Fonnie Jarvis Other Clinician: Referring Tameron Lama: Treating Tyjon Bowen/Extender: Dorann Ou in Treatment: 8 Active Problems Location of Pain Severity and Description of Pain Patient Has Paino Yes Site Locations Pain LocationSILVANNA, OHMER ([id number]) [id number] Page 5 of 7 Pain Location: Pain in Ulcers Rate the pain. Current Pain Level: 7 Pain Management and Medication Current Pain Management: Electronic Signature(s) Signed: 01/31/2022 4:31:24 PM By: Erenest Blank Entered By: Erenest Blank on 01/31/2022 14:40:45 -------------------------------------------------------------------------------- Patient/Caregiver Education Details Patient Name: Date of Service: [name] 10/12/2023andnbsp2:15 PM Medical Record Number: [id number] Patient Account Number: [id number] Date of Birth/Gender: Treating RN: Oct 12, 1945 (76 y.o. [name] Primary Care Physician: Fonnie Jarvis Other Clinician:  Referring Physician: Treating Physician/Extender: Dorann Ou in Treatment: 8 Education Assessment Education Provided To: Patient Education  Topics Provided Pressure: Handouts: Preventing Pressure Ulcers Methods: Explain/Verbal Responses: Reinforcements needed Electronic Signature(s) Signed: 01/31/2022 4:52:38 PM By: Deon Pilling RN, BSN Entered By: Deon Pilling on 01/31/2022 15:54:39 -------------------------------------------------------------------------------- Wound Assessment Details Patient Name: Date of Service: [name] 01/31/2022 2:15 PM Medical Record Number: [id number] Patient Account Number: [id number] Date of Birth/Sex: Treating RN: 14-Feb-1946 (76 y.o. F) Primary Care Afifa Truax: Fonnie Jarvis Other Clinician: Marlaine Hind ([id number]) [id number] Page 6 of 7 Referring Bryam Taborda: Treating Tammala Weider/Extender: Dorann Ou in Treatment: 8 Wound Status Wound Number: 1 Primary Pressure Ulcer Etiology: Wound Location: Sacrum Wound Open Wounding Event: Pressure Injury Status: Date Acquired: 11/02/2021 Comorbid Cataracts, Chronic sinus problems/congestion, Middle ear Weeks Of Treatment: 8 History: problems, Anemia, Asthma, Osteoarthritis, Quadriplegia Clustered Wound: Yes Photos Wound Measurements Length: (cm) Width: (cm) Depth: (cm) Clustered Quantity: Area: (cm) Volume: (cm) 2 % Reduction in Area: 91% 0.9 % Reduction in Volume: 91% 0.1 Epithelialization: Large (67-100%) 1 Tunneling: No 1.414 Undermining: No 0.141 Wound Description Classification: Category/Stage III Wound Margin: Distinct, outline attached Exudate Amount: Medium Exudate Type: Serosanguineous Exudate Color: red, brown Foul Odor After Cleansing: No Slough/Fibrino Yes Wound Bed Granulation Amount: Large (67-100%) Exposed Structure Granulation Quality: Red, Pink Fascia Exposed: No Necrotic Amount: None Present (0%) Fat Layer (Subcutaneous Tissue) Exposed: Yes Tendon Exposed: No Muscle Exposed: No Joint Exposed: No Bone Exposed: No Periwound Skin  Texture Texture Color No Abnormalities Noted: No No Abnormalities Noted: No Callus: No Atrophie Blanche: No Crepitus: No Cyanosis: No Excoriation: No Ecchymosis: No Induration: No Erythema: No Rash: No Hemosiderin Staining: No Scarring: No Mottled: No Pallor: No Moisture Rubor: No No Abnormalities Noted: No Dry / Scaly: No Maceration: No Treatment Notes Wound #1 (Sacrum) Cleanser Soap and Water Discharge Instruction: May shower and wash wound with dial antibacterial soap and water prior to dressing change. Wound Cleanser Discharge Instruction: Cleanse the wound with wound cleanser prior to applying a clean dressing using gauze sponges, not tissue or cotton balls. SUMI, LYE ([id number]) [id number] Page 7 of 7 Peri-Wound Care Skin Prep Discharge Instruction: Use skin prep as directed Zinc Oxide Ointment 30g tube Discharge Instruction: Apply Zinc Oxide to periwound with each dressing change Topical Primary Dressing Hydrofera Blue Ready Foam, 4x5 in Discharge Instruction: Apply to wound bed as instructed Santyl Ointment Discharge Instruction: Apply nickel thick amount to wound bed as instructed Secondary Dressing Zetuvit Plus Silicone Border Dressing 5x5 (in/in) Discharge Instruction: Apply silicone border over primary dressing as directed. Secured With Compression Wrap Compression Stockings Environmental education officer) Signed: 01/31/2022 4:31:24 PM By: Erenest Blank Entered By: Erenest Blank on 01/31/2022 14:55:17 -------------------------------------------------------------------------------- Vitals Details Patient Name: Date of Service: [name] 01/31/2022 2:15 PM Medical Record Number: [id number] Patient Account Number: [id number] Date of Birth/Sex: Treating RN: 01/20/1946 (76 y.o. F) Primary Care Donnia Poplaski: Fonnie Jarvis Other Clinician: Referring Ardie Dragoo: Treating Basil Buffin/Extender: Dorann Ou in Treatment: 8 Vital Signs Time Taken: 14:40 Temperature (F): 98.2 Height (in): 64 Pulse (bpm): 91 Weight (lbs): 110 Respiratory Rate (breaths/min): 16 Body Mass Index (BMI): 18.9 Blood Pressure (mmHg): 86/64 Reference Range: 80 - 120 mg / dl Electronic Signature(s) Signed: 01/31/2022 4:31:24 PM By: Erenest Blank Entered By: Erenest Blank on 01/31/2022 14:40:35
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
## 2022-02-02 DIAGNOSIS — G8250 Quadriplegia, unspecified: Secondary | ICD-10-CM | POA: Diagnosis not present

## 2022-02-02 MED ORDER — INCOBOTULINUMTOXINA 100 UNITS IM SOLR
400.0000 [IU] | INTRAMUSCULAR | Status: DC
  Administered 2022-02-02: 400 [IU] via INTRAMUSCULAR

## 2022-02-12 ENCOUNTER — Encounter (HOSPITAL_BASED_OUTPATIENT_CLINIC_OR_DEPARTMENT_OTHER): Payer: PPO | Admitting: Internal Medicine

## 2022-02-18 ENCOUNTER — Encounter (HOSPITAL_BASED_OUTPATIENT_CLINIC_OR_DEPARTMENT_OTHER): Payer: PPO | Admitting: Internal Medicine

## 2022-02-18 DIAGNOSIS — G8250 Quadriplegia, unspecified: Secondary | ICD-10-CM | POA: Diagnosis not present

## 2022-02-18 DIAGNOSIS — G35 Multiple sclerosis: Secondary | ICD-10-CM

## 2022-02-18 DIAGNOSIS — L89153 Pressure ulcer of sacral region, stage 3: Secondary | ICD-10-CM | POA: Diagnosis not present
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
## 2022-03-04 ENCOUNTER — Encounter (HOSPITAL_BASED_OUTPATIENT_CLINIC_OR_DEPARTMENT_OTHER): Payer: PPO | Attending: Internal Medicine | Admitting: Internal Medicine

## 2022-03-04 DIAGNOSIS — L89012 Pressure ulcer of right elbow, stage 2: Secondary | ICD-10-CM | POA: Diagnosis present

## 2022-03-04 DIAGNOSIS — G8250 Quadriplegia, unspecified: Secondary | ICD-10-CM | POA: Insufficient documentation

## 2022-03-04 DIAGNOSIS — L89622 Pressure ulcer of left heel, stage 2: Secondary | ICD-10-CM | POA: Diagnosis not present

## 2022-03-04 DIAGNOSIS — D649 Anemia, unspecified: Secondary | ICD-10-CM | POA: Insufficient documentation

## 2022-03-04 DIAGNOSIS — G35 Multiple sclerosis: Secondary | ICD-10-CM | POA: Insufficient documentation

## 2022-03-04 DIAGNOSIS — L89022 Pressure ulcer of left elbow, stage 2: Secondary | ICD-10-CM | POA: Insufficient documentation

## 2022-03-04 DIAGNOSIS — L89153 Pressure ulcer of sacral region, stage 3: Secondary | ICD-10-CM | POA: Diagnosis not present

## 2022-03-04 DIAGNOSIS — E039 Hypothyroidism, unspecified: Secondary | ICD-10-CM | POA: Diagnosis not present

## 2022-03-04 DIAGNOSIS — I442 Atrioventricular block, complete: Secondary | ICD-10-CM | POA: Diagnosis not present

## 2022-03-04 DIAGNOSIS — M1990 Unspecified osteoarthritis, unspecified site: Secondary | ICD-10-CM | POA: Diagnosis not present

## 2022-03-04 DIAGNOSIS — L89610 Pressure ulcer of right heel, unstageable: Secondary | ICD-10-CM | POA: Insufficient documentation
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
## 2022-03-08 NOTE — Progress Notes (Signed)
[name], [name] ([id number]) [id number] Page 1 of 16 Visit Report for 03/04/2022 Arrival Information Details Patient Name: Date of Service: [name], [name] 03/04/2022 1:45 PM Medical Record Number: [id number] Patient Account Number: [id number] Date of Birth/Sex: Treating RN: 06-Jun-1945 (76 y.o. F) Primary Care Janiyha Montufar: Fonnie Jarvis Other Clinician: Referring Verla Bryngelson: Treating Rilyn Upshaw/Extender: Dorann Ou in Treatment: 13 Visit Information History Since Last Visit Added or deleted any medications: No Patient Arrived: Wheel Chair Any new allergies or adverse reactions: No Arrival Time: 14:05 Had a fall or experienced change in No Accompanied By: caregiver activities of daily living that may affect Transfer Assistance: Harrel Lemon Lift risk of falls: Patient Identification Verified: Yes Signs or symptoms of abuse/neglect since last visito No Secondary Verification Process Completed: Yes Hospitalized since last visit: No Patient Requires Transmission-Based Precautions: No Implantable device outside of the clinic excluding No Patient Has Alerts: No cellular tissue based products placed in the center since last visit: Has Dressing in Place as Prescribed: Yes Pain Present Now: Yes Electronic Signature(s) Signed: 03/05/2022 3:56:34 PM By: Erenest Blank Entered By: Erenest Blank on 03/04/2022 14:05:48 -------------------------------------------------------------------------------- Clinic Level of Care Assessment Details Patient Name: Date of Service: [name], [name] 03/04/2022 1:45 PM Medical Record Number: [id number] Patient Account Number: [id number] Date of Birth/Sex: Treating RN: [birth year] (76 y.o. [name], [name] Primary Care Kween Bacorn: Fonnie Jarvis Other Clinician: Referring Yzabella Crunk: Treating Tennile Styles/Extender: Dorann Ou in Treatment: 13 Clinic Level of Care  Assessment Items TOOL 4 Quantity Score X- 1 0 Use when only an EandM is performed on FOLLOW-UP visit ASSESSMENTS - Nursing Assessment / Reassessment X- 1 10 Reassessment of Co-morbidities (includes updates in patient status) X- 1 5 Reassessment of Adherence to Treatment Plan ASSESSMENTS - Wound and Skin A ssessment / Reassessment _0  - 0 Simple Wound Assessment / Reassessment - one wound X- 5 5 Complex Wound Assessment / Reassessment - multiple wounds _1  - 0 Dermatologic / Skin Assessment (not related to wound area) ASSESSMENTS - Focused Assessment X- 2 5 Circumferential Edema Measurements - multi extremities _2  - 0 Nutritional Assessment / Counseling / Intervention EMANUELA, RUNNION ([id number]) [phone].pdf Page 2 of 16 _3  - 0 Lower Extremity Assessment (monofilament, tuning fork, pulses) _4  - 0 Peripheral Arterial Disease Assessment (using hand held doppler) ASSESSMENTS - Ostomy and/or Continence Assessment and Care _5  - 0 Incontinence Assessment and Management _6  - 0 Ostomy Care Assessment and Management (repouching, etc.) PROCESS - Coordination of Care _7  - 0 Simple Patient / Family Education for ongoing care X- 1 20 Complex (extensive) Patient / Family Education for ongoing care X- 1 10 Staff obtains Programmer, systems, Records, T Results / Process Orders est _8  - 0 Staff telephones HHA, Nursing Homes / Clarify orders / etc _9  - 0 Routine Transfer to another Facility (non-emergent condition) _10  - 0 Routine Hospital Admission (non-emergent condition) _11  - 0 New Admissions / Biomedical engineer / Ordering NPWT Apligraf, etc. , _12  - 0 Emergency Hospital Admission (emergent condition) _13  - 0 Simple Discharge Coordination X- 1 15 Complex (extensive) Discharge Coordination PROCESS - Special Needs _14  - 0 Pediatric / Minor Patient Management _15  - 0 Isolation Patient Management _16  - 0 Hearing / Language / Visual special needs _17  -  0 Assessment of Community assistance (transportation, D/C planning, etc.) _18  - 0 Additional assistance / Altered mentation _19  - 0 Support Surface(s) Assessment (bed, cushion, seat, etc.) INTERVENTIONS - Wound Cleansing / Measurement _20  - 0 Simple Wound Cleansing - one wound X- 5  5 Complex Wound Cleansing - multiple wounds X- 1 5 Wound Imaging (photographs - any number of wounds) _0  - 0 Wound Tracing (instead of photographs) _1  - 0 Simple Wound Measurement - one wound X- 5 5 Complex Wound Measurement - multiple wounds INTERVENTIONS - Wound Dressings _2  - 0 Small Wound Dressing one or multiple wounds X- 5 15 Medium Wound Dressing one or multiple wounds _3  - 0 Large Wound Dressing one or multiple wounds X- 1 5 Application of Medications - topical <HALPFXTKWIOXBDZH>_2<\/DJMEQASTMHDQQIWL>_7  - 0 Application of Medications - injection INTERVENTIONS - Miscellaneous _5  - 0 External ear exam _6  - 0 Specimen Collection (cultures, biopsies, blood, body fluids, etc.) _7  - 0 Specimen(s) / Culture(s) sent or taken to Lab for analysis X- 1 10 Patient Transfer (multiple staff / Civil Service fast streamer / Similar devices) _8  - 0 Simple Staple / Suture removal (25 or less) _9  - 0 Complex Staple / Suture removal (26 or more) _10  - 0 Hypo / Hyperglycemic Management (close monitor of Blood Glucose) Castoro, Catalina ([id number]) [id number] Page 3 of 16 X- 1 15 Ankle / Brachial Index (ABI) - do not check if billed separately X- 1 5 Vital Signs Has the patient been seen at the hospital within the last three years: Yes Total Score: 260 Level Of Care: New/Established - Level 5 Electronic Signature(s) Signed: 03/04/2022 4:10:29 PM By: Rhae Hammock RN Entered By: Rhae Hammock on 03/04/2022 15:30:17 -------------------------------------------------------------------------------- Encounter Discharge Information Details Patient Name: Date of Service: [name] 03/04/2022 1:45 PM Medical Record  Number: [id number] Patient Account Number: [id number] Date of Birth/Sex: Treating RN: Feb 15, 1946 (76 y.o. [name], [name] Primary Care Kanishk Stroebel: Fonnie Jarvis Other Clinician: Referring Starsky Nanna: Treating Carly Applegate/Extender: Dorann Ou in Treatment: 13 Encounter Discharge Information Items Discharge Condition: Stable Ambulatory Status: Wheelchair Discharge Destination: Home Transportation: Private Auto Accompanied By: aide Schedule Follow-up Appointment: Yes Clinical Summary of Care: Patient Declined Electronic Signature(s) Signed: 03/08/2022 12:09:11 PM By: Rhae Hammock RN Entered By: Rhae Hammock on 03/05/2022 14:14:36 -------------------------------------------------------------------------------- Lower Extremity Assessment Details Patient Name: Date of Service: [name] 03/04/2022 1:45 PM Medical Record Number: [id number] Patient Account Number: [id number] Date of Birth/Sex: Treating RN: July 25, 1945 (76 y.o. F) Primary Care Dustina Scoggin: Fonnie Jarvis Other Clinician: Referring Treyvin Glidden: Treating Jyrah Blye/Extender: Dorann Ou in Treatment: 13 Edema Assessment Assessed: [Left: Yes] Patrice Paradise: Yes] Edema: [Left: Yes] [Right: Yes] Calf Left: Right: Point of Measurement: 30 cm From Medial Instep 28 cm 27 cm Ankle Left: Right: Point of Measurement: 9 cm From Medial Instep 19 cm 17 cm Knee To Floor Left: RightAMAYRA, KIEDROWSKI ([id number]) [phone].pdf Page 4 of 16 From Medial Instep 40 cm 40 cm Vascular Assessment Pulses: Dorsalis Pedis Palpable: [Left:Yes] [Right:Yes] Doppler Audible: [Left:Yes] [Right:Yes] Posterior Tibial Palpable: [Left:Yes] [Right:Yes] Doppler Audible: [Left:Yes] [Right:Yes] Blood Pressure: Brachial: [Left:107] Ankle: [Left:Dorsalis Pedis: 120 1.12] [Right:Dorsalis Pedis: 130 1.21] Notes bilateral foot drop. bilateral feet  cold. Electronic Signature(s) Signed: 03/05/2022 5:53:09 PM By: Deon Pilling RN, BSN Entered By: Deon Pilling on 03/04/2022 14:32:52 -------------------------------------------------------------------------------- Multi Wound Chart Details Patient Name: Date of Service: [name] 03/04/2022 1:45 PM Medical Record Number: [id number] Patient Account Number: [id number] Date of Birth/Sex: Treating RN: 1946/02/08 (76 y.o. F) Primary Care Khaniya Tenaglia: Fonnie Jarvis Other Clinician: Referring Dailyn Reith: Treating Kerrilyn Azbill/Extender: Dorann Ou in Treatment: 13 Vital Signs Height(in): 64 Pulse(bpm): 51 Weight(lbs): 110 Blood Pressure(mmHg): 107/71 Body Mass Index(BMI): 18.9 Temperature(F): 98.4 Respiratory Rate(breaths/min): 16 [1:Photos:] Sacrum Right Elbow Right Calcaneus Wound  Location: Pressure Injury Pressure Injury Pressure Injury Wounding Event: Pressure Ulcer Pressure Ulcer Pressure Ulcer Primary Etiology: Cataracts, Chronic sinus Cataracts, Chronic sinus Cataracts, Chronic sinus Comorbid History: problems/congestion, Middle ear problems/congestion, Middle ear problems/congestion, Middle ear problems, Anemia, Asthma, problems, Anemia, Asthma, problems, Anemia, Asthma, Osteoarthritis, Quadriplegia Osteoarthritis, Quadriplegia Osteoarthritis, Quadriplegia 11/02/2021 02/25/2022 02/25/2022 Date Acquired: 13 0 0 Weeks of Treatment: Open Open Open Wound Status: No No No Wound Recurrence: Yes No No Clustered Wound: 1 N/A N/A Clustered Quantity: 2x3x0.1 0.8x0.7x0.1 1x0.5x0.1 Measurements L x W x D (cm) 4.712 0.44 0.393 A (cm) : rea 0.471 0.044 0.039 Volume (cm) : 70.00% N/A N/A % Reduction in AreaPETRITA, BLUNCK ([id number]) [id number] Page 5 of 16 70.00% N/A N/A % Reduction in Volume: Category/Stage III Category/Stage II Unstageable/Unclassified Classification: Medium Medium Medium Exudate A  mount: Serosanguineous Serosanguineous Serosanguineous Exudate Type: red, brown red, brown red, brown Exudate Color: Distinct, outline attached Distinct, outline attached Distinct, outline attached Wound Margin: Large (67-100%) N/A N/A Granulation A mount: Red, Pink N/A N/A Granulation Quality: Small (1-33%) N/A N/A Necrotic A mount: Adherent Slough Eschar Eschar Necrotic Tissue: Fascia: No Fascia: No Fascia: No Exposed Structures: Fat Layer (Subcutaneous Tissue): No Fat Layer (Subcutaneous Tissue): No Fat Layer (Subcutaneous Tissue): No Tendon: No Tendon: No Tendon: No Muscle: No Muscle: No Muscle: No Joint: No Joint: No Joint: No Bone: No Bone: No Bone: No Medium (34-66%) Small (1-33%) None Epithelialization: Excoriation: No Excoriation: No Excoriation: No Periwound Skin Texture: Induration: No Induration: No Induration: No Callus: No Callus: No Callus: No Crepitus: No Crepitus: No Crepitus: No Rash: No Rash: No Rash: No Scarring: No Scarring: No Scarring: No Maceration: No Maceration: No Maceration: No Periwound Skin Moisture: Dry/Scaly: No Dry/Scaly: No Dry/Scaly: No Atrophie Blanche: No Atrophie Blanche: No Atrophie Blanche: No Periwound Skin Color: Cyanosis: No Cyanosis: No Cyanosis: No Ecchymosis: No Ecchymosis: No Ecchymosis: No Erythema: No Erythema: No Erythema: No Hemosiderin Staining: No Hemosiderin Staining: No Hemosiderin Staining: No Mottled: No Mottled: No Mottled: No Pallor: No Pallor: No Pallor: No Rubor: No Rubor: No Rubor: No N/A N/A N/A Temperature: N/A N/A N/A Assessment Notes: Wound Number: 4 5 N/A Photos: No Photos N/A Left Calcaneus Left Elbow N/A Wound Location: Blister Pressure Injury N/A Wounding Event: Pressure Ulcer Pressure Ulcer N/A Primary Etiology: Cataracts, Chronic sinus Cataracts, Chronic sinus N/A Comorbid History: problems/congestion, Middle ear problems/congestion, Middle  ear problems, Anemia, Asthma, problems, Anemia, Asthma, Osteoarthritis, Quadriplegia Osteoarthritis, Quadriplegia 02/25/2022 03/04/2022 N/A Date Acquired: 0 0 N/A Weeks of Treatment: Open Open N/A Wound Status: No No N/A Wound Recurrence: No No N/A Clustered Wound: N/A N/A N/A Clustered Quantity: 3.5x2.5x0.1 0.8x0.7x0.2 N/A Measurements L x W x D (cm) 6.872 0.44 N/A A (cm) : rea 0.687 0.088 N/A Volume (cm) : N/A N/A N/A % Reduction in A rea: N/A N/A N/A % Reduction in Volume: Category/Stage II Category/Stage II N/A Classification: Medium Medium N/A Exudate A mount: Serosanguineous Serosanguineous N/A Exudate Type: red, brown red, brown N/A Exudate Color: Distinct, outline attached Distinct, outline attached N/A Wound Margin: None Present (0%) Large (67-100%) N/A Granulation A mount: N/A Red N/A Granulation Quality: None Present (0%) N/A N/A Necrotic A mount: N/A N/A N/A Necrotic Tissue: Fascia: No Fascia: No N/A Exposed Structures: Fat Layer (Subcutaneous Tissue): No Fat Layer (Subcutaneous Tissue): No Tendon: No Tendon: No Muscle: No Muscle: No Joint: No Joint: No Bone: No Bone: No None None N/A Epithelialization: Excoriation: No Excoriation: No N/A Periwound Skin Texture: Induration: No Induration: No Callus: No Callus:  No Crepitus: No Crepitus: No Rash: No Rash: No Scarring: No Scarring: No BRITTINY, LEVITZ ([id number]) [phone].pdf Page 6 of 16 Maceration: No Maceration: No N/A Periwound Skin Moisture: Dry/Scaly: No Dry/Scaly: No Atrophie Blanche: No Atrophie Blanche: No N/A Periwound Skin Color: Cyanosis: No Cyanosis: No Ecchymosis: No Ecchymosis: No Erythema: No Erythema: No Hemosiderin Staining: No Hemosiderin Staining: No Mottled: No Mottled: No Pallor: No Pallor: No Rubor: No Rubor: No N/A No Abnormality N/A Temperature: N/A Yes N/A Tenderness on Palpation: blistered filled and  intact. N/A N/A Assessment Notes: Treatment Notes Electronic Signature(s) Signed: 03/05/2022 6:10:28 PM By: Kalman Shan DO Entered By: Kalman Shan on 03/04/2022 15:36:59 -------------------------------------------------------------------------------- Multi-Disciplinary Care Plan Details Patient Name: Date of Service: [name] 03/04/2022 1:45 PM Medical Record Number: [id number] Patient Account Number: [id number] Date of Birth/Sex: Treating RN: 1946-01-22 (76 y.o. [name], [name] Primary Care Mattea Seger: Fonnie Jarvis Other Clinician: Referring Beckam Abdulaziz: Treating Amariya Liskey/Extender: Dorann Ou in Treatment: 13 Active Inactive Nutrition Nursing Diagnoses: Potential for alteratiion in Nutrition/Potential for imbalanced nutrition Goals: Patient/caregiver agrees to and verbalizes understanding of need to obtain nutritional consultation Date Initiated: 12/03/2021 Target Resolution Date: 03/23/2022 Goal Status: Active Interventions: Assess patient nutrition upon admission and as needed per policy Provide education on nutrition Treatment Activities: Education provided on Nutrition : 03/04/2022 Patient referred to Primary Care Physician for further nutritional evaluation : 12/03/2021 Notes: Pain, Acute or Chronic Nursing Diagnoses: Pain, acute or chronic: actual or potential Potential alteration in comfort, pain Goals: Patient will verbalize adequate pain control and receive pain control interventions during procedures as needed Date Initiated: 12/03/2021 Target Resolution Date: 03/23/2022 Goal Status: Active Patient/caregiver will verbalize comfort level met Date Initiated: 12/03/2021 Target Resolution Date: 03/23/2022 Goal Status: Active Interventions: Encourage patient to take pain medications as prescribed MERIAN, WROE ([id number]) [id number] Page 7 of 16 Provide education on pain  management Reposition patient for comfort Treatment Activities: Administer pain control measures as ordered : 12/03/2021 Notes: Pressure Nursing Diagnoses: Knowledge deficit related to management of pressures ulcers Potential for impaired tissue integrity related to pressure, friction, moisture, and shear Goals: Patient will remain free from development of additional pressure ulcers Date Initiated: 12/03/2021 Target Resolution Date: 03/23/2022 Goal Status: Active Interventions: Assess: immobility, friction, shearing, incontinence upon admission and as needed Assess offloading mechanisms upon admission and as needed Provide education on pressure ulcers Treatment Activities: Patient referred for seating evaluation to ensure proper offloading : 12/03/2021 T ordered outside of clinic : 12/03/2021 est Notes: Electronic Signature(s) Signed: 03/04/2022 4:10:29 PM By: Rhae Hammock RN Entered By: Rhae Hammock on 03/04/2022 15:19:47 -------------------------------------------------------------------------------- Pain Assessment Details Patient Name: Date of Service: [name] 03/04/2022 1:45 PM Medical Record Number: [id number] Patient Account Number: [id number] Date of Birth/Sex: Treating RN: [birth year] (76 y.o. F) Primary Care Salinda Snedeker: Fonnie Jarvis Other Clinician: Referring Myrta Mercer: Treating Adelfa Lozito/Extender: Dorann Ou in Treatment: 13 Active Problems Location of Pain Severity and Description of Pain Patient Has Paino Yes Site Locations Pain Location: Generalized Pain, Pain in Ulcers Rate the pain. Current Pain Level: 5 Pain Management and Medication Current Pain Management: CAELIN, ROSEN ([id number]) [id number] Page 8 of 16 Electronic Signature(s) Signed: 03/05/2022 3:56:34 PM By: Erenest Blank Entered By: Erenest Blank on 03/04/2022  14:07:32 -------------------------------------------------------------------------------- Patient/Caregiver Education Details Patient Name: Date of Service: [name] 11/13/2023andnbsp1:45 PM Medical Record Number: [id number] Patient Account Number: [id number] Date of Birth/Gender: Treating RN: October 06, 1945 (76 y.o. Benjaman Lobe Primary Care Physician: Fonnie Jarvis Other Clinician: Referring  Physician: Treating Physician/Extender: Dorann Ou in Treatment: 13 Education Assessment Education Provided To: Patient Education Topics Provided Nutrition: Methods: Explain/Verbal Responses: Reinforcements needed, State content correctly Pain: Methods: Explain/Verbal Responses: Reinforcements needed, State content correctly Pressure: Methods: Explain/Verbal Responses: Reinforcements needed, State content correctly Electronic Signature(s) Signed: 03/04/2022 4:10:29 PM By: Rhae Hammock RN Entered By: Rhae Hammock on 03/04/2022 15:17:33 -------------------------------------------------------------------------------- Wound Assessment Details Patient Name: Date of Service: [name] 03/04/2022 1:45 PM Medical Record Number: [id number] Patient Account Number: [id number] Date of Birth/Sex: Treating RN: 10-Dec-1945 (76 y.o. [name] Primary Care Jahel Wavra: Fonnie Jarvis Other Clinician: Referring Dylin Breeden: Treating Stella Encarnacion/Extender: Dorann Ou in Treatment: 13 Wound Status Wound Number: 1 Primary Pressure Ulcer Etiology: Wound Location: Sacrum Wound Open Wounding Event: Pressure Injury Status: Date Acquired: 11/02/2021 Comorbid Cataracts, Chronic sinus problems/congestion, Middle ear Weeks Of Treatment: 13 History: problems, Anemia, Asthma, Osteoarthritis, Quadriplegia Clustered Wound: Yes Photos ALIECE, HONOLD ([id number]) [id number] Page 9 of  16 Wound Measurements Length: (cm) Width: (cm) Depth: (cm) Clustered Quantity: Area: (cm) Volume: (cm) 2 % Reduction in Area: 70% 3 % Reduction in Volume: 70% 0.1 Epithelialization: Medium (34-66%) 1 Tunneling: No 4.712 Undermining: No 0.471 Wound Description Classification: Category/Stage III Wound Margin: Distinct, outline attached Exudate Amount: Medium Exudate Type: Serosanguineous Exudate Color: red, brown Foul Odor After Cleansing: No Slough/Fibrino Yes Wound Bed Granulation Amount: Large (67-100%) Exposed Structure Granulation Quality: Red, Pink Fascia Exposed: No Necrotic Amount: Small (1-33%) Fat Layer (Subcutaneous Tissue) Exposed: No Necrotic Quality: Adherent Slough Tendon Exposed: No Muscle Exposed: No Joint Exposed: No Bone Exposed: No Periwound Skin Texture Texture Color No Abnormalities Noted: No No Abnormalities Noted: No Callus: No Atrophie Blanche: No Crepitus: No Cyanosis: No Excoriation: No Ecchymosis: No Induration: No Erythema: No Rash: No Hemosiderin Staining: No Scarring: No Mottled: No Pallor: No Moisture Rubor: No No Abnormalities Noted: No Dry / Scaly: No Maceration: No Treatment Notes Wound #1 (Sacrum) Cleanser Soap and Water Discharge Instruction: May shower and wash wound with dial antibacterial soap and water prior to dressing change. Wound Cleanser Discharge Instruction: Cleanse the wound with wound cleanser prior to applying a clean dressing using gauze sponges, not tissue or cotton balls. Peri-Wound Care Skin Prep Discharge Instruction: Use skin prep as directed Zinc Oxide Ointment 30g tube Discharge Instruction: Apply Zinc Oxide to periwound with each dressing change Topical Primary Dressing Hydrofera Blue Ready Foam, 4x5 in Discharge Instruction: Apply to wound bed as instructed ALANIA, OVERHOLT ([id number]) [id number] Page 10 of 16 Discharge Instruction: Apply  nickel thick amount to wound bed as instructed Secondary Dressing Secured With Compression Wrap Compression Stockings Add-Ons Electronic Signature(s) Signed: 03/05/2022 5:53:09 PM By: Deon Pilling RN, BSN Entered By: Deon Pilling on 03/04/2022 14:38:55 -------------------------------------------------------------------------------- Wound Assessment Details Patient Name: Date of Service: [name] 03/04/2022 1:45 PM Medical Record Number: [id number] Patient Account Number: [id number] Date of Birth/Sex: Treating RN: 12-25-1945 (76 y.o. [name], Meta.Reding Primary Care Idrissa Beville: Fonnie Jarvis Other Clinician: Referring Marcell Pfeifer: Treating Kamil Mchaffie/Extender: Dorann Ou in Treatment: 13 Wound Status Wound Number: 2 Primary Pressure Ulcer Etiology: Wound Location: Right Elbow Wound Open Wounding Event: Pressure Injury Status: Date Acquired: 02/25/2022 Comorbid Cataracts, Chronic sinus problems/congestion, Middle ear Weeks Of Treatment: 0 History: problems, Anemia, Asthma, Osteoarthritis, Quadriplegia Clustered Wound: No Photos Wound Measurements Length: (cm) 0.8 Width: (cm) 0.7 Depth: (cm) 0.1 Area: (cm) 0.44 Volume: (cm) 0.044 % Reduction in Area: % Reduction in Volume: Epithelialization: Small (1-33%) Tunneling: No  Undermining: No Wound Description Classification: Category/Stage II Wound Margin: Distinct, outline attached Exudate Amount: Medium Exudate Type: Serosanguineous Exudate Color: red, brown Foul Odor After Cleansing: No Slough/Fibrino Yes Wound Bed Necrotic Amount: Large (67-100%) Exposed Structure Necrotic Quality: Eschar Fascia Exposed: No Fat Layer (Subcutaneous Tissue) Exposed: No Tendon Exposed: No Muscle Exposed: No Kadar, Almyra ([id number]) [id number] Page 11 of 16 Joint Exposed: No Bone Exposed: No Periwound Skin Texture Texture Color No Abnormalities Noted: No No  Abnormalities Noted: No Callus: No Atrophie Blanche: No Crepitus: No Cyanosis: No Excoriation: No Ecchymosis: No Induration: No Erythema: No Rash: No Hemosiderin Staining: No Scarring: No Mottled: No Pallor: No Moisture Rubor: No No Abnormalities Noted: No Dry / Scaly: No Maceration: No Treatment Notes Wound #2 (Elbow) Wound Laterality: Right Cleanser Peri-Wound Care Topical Primary Dressing Secondary Dressing Zetuvit Plus Silicone Border Dressing 5x5 (in/in) Discharge Instruction: Apply silicone border over primary dressing as directed to protect the area Secured With Compression Wrap Compression Stockings Add-Ons Electronic Signature(s) Signed: 03/04/2022 4:10:29 PM By: Rhae Hammock RN Signed: 03/05/2022 5:53:09 PM By: Deon Pilling RN, BSN Entered By: Rhae Hammock on 03/04/2022 15:26:16 -------------------------------------------------------------------------------- Wound Assessment Details Patient Name: Date of Service: [name] 03/04/2022 1:45 PM Medical Record Number: [id number] Patient Account Number: [id number] Date of Birth/Sex: Treating RN: 05-Apr-1946 (76 y.o. [name], [name] Primary Care Zakara Parkey: Fonnie Jarvis Other Clinician: Referring Raney Koeppen: Treating Mersades Barbaro/Extender: Dorann Ou in Treatment: 13 Wound Status Wound Number: 3 Primary Pressure Ulcer Etiology: Wound Location: Right Calcaneus Wound Open Wounding Event: Pressure Injury Status: Date Acquired: 02/25/2022 Comorbid Cataracts, Chronic sinus problems/congestion, Middle ear Weeks Of Treatment: 0 History: problems, Anemia, Asthma, Osteoarthritis, Quadriplegia Clustered Wound: No Photos MERILYN, PAGAN ([id number]) [id number] Page 12 of 16 Wound Measurements Length: (cm) 1 Width: (cm) 0.5 Depth: (cm) 0.1 Area: (cm) 0.393 Volume: (cm) 0.039 % Reduction in Area: % Reduction in  Volume: Epithelialization: None Tunneling: No Undermining: No Wound Description Classification: Unstageable/Unclassified Wound Margin: Distinct, outline attached Exudate Amount: Medium Exudate Type: Serosanguineous Exudate Color: red, brown Foul Odor After Cleansing: No Wound Bed Necrotic Amount: Large (67-100%) Exposed Structure Necrotic Quality: Eschar Fascia Exposed: No Fat Layer (Subcutaneous Tissue) Exposed: No Tendon Exposed: No Muscle Exposed: No Joint Exposed: No Bone Exposed: No Periwound Skin Texture Texture Color No Abnormalities Noted: No No Abnormalities Noted: No Callus: No Atrophie Blanche: No Crepitus: No Cyanosis: No Excoriation: No Ecchymosis: No Induration: No Erythema: No Rash: No Hemosiderin Staining: No Scarring: No Mottled: No Pallor: No Moisture Rubor: No No Abnormalities Noted: No Dry / Scaly: No Maceration: No Treatment Notes Wound #3 (Calcaneus) Wound Laterality: Right Cleanser Peri-Wound Care Topical Primary Dressing Secondary Dressing Zetuvit Plus Silicone Border Dressing 5x5 (in/in) Discharge Instruction: Apply silicone border over primary dressing as directed to protect the area Secured With Compression Wrap Compression Stockings Add-Ons Electronic Signature(s) Signed: 03/05/2022 5:53:09 PM By: Deon Pilling RN, BSN [place]neveld, Baldo Ash ([id number]) By: Deon Pilling RN, BSN [phone].pdf Page 13 of 16 Signed: 03/05/2022 5:53:09 PM Entered By: Deon Pilling on 03/04/2022 14:37:43 -------------------------------------------------------------------------------- Wound Assessment Details Patient Name: Date of Service: [name], [name] 03/04/2022 1:45 PM Medical Record Number: [id number] Patient Account Number: [id number] Date of Birth/Sex: Treating RN: [birth year] (76 y.o. [name] Primary Care Alivia Cimino: Fonnie Jarvis Other Clinician: Referring Raeden Belzer: Treating December Hedtke/Extender:  Aloha Gell Weeks in Treatment: 13 Wound Status Wound Number: 4 Primary Pressure Ulcer Etiology: Wound Location: Left Calcaneus Wound Open Wounding Event: Blister Status: Date Acquired: 02/25/2022 Comorbid Cataracts,  Chronic sinus problems/congestion, Middle ear Weeks Of Treatment: 0 History: problems, Anemia, Asthma, Osteoarthritis, Quadriplegia Clustered Wound: No Photos Wound Measurements Length: (cm) 3.5 Width: (cm) 2.5 Depth: (cm) 0.1 Area: (cm) 6.872 Volume: (cm) 0.687 % Reduction in Area: % Reduction in Volume: Epithelialization: None Tunneling: No Undermining: No Wound Description Classification: Category/Stage II Wound Margin: Distinct, outline attached Exudate Amount: Medium Exudate Type: Serosanguineous Exudate Color: red, brown Foul Odor After Cleansing: No Slough/Fibrino No Wound Bed Granulation Amount: None Present (0%) Exposed Structure Necrotic Amount: None Present (0%) Fascia Exposed: No Fat Layer (Subcutaneous Tissue) Exposed: No Tendon Exposed: No Muscle Exposed: No Joint Exposed: No Bone Exposed: No Periwound Skin Texture Texture Color No Abnormalities Noted: No No Abnormalities Noted: No Callus: No Atrophie Blanche: No Crepitus: No Cyanosis: No Excoriation: No Ecchymosis: No Induration: No Erythema: No Rash: No Hemosiderin Staining: No Scarring: No Mottled: No Pallor: No Moisture Patin, [place] ([id number]) [phone].pdf Page 14 of 16 Rubor: No No Abnormalities Noted: No Dry / Scaly: No Maceration: No Assessment Notes blistered filled and intact. Treatment Notes Wound #4 (Calcaneus) Wound Laterality: Left Cleanser Peri-Wound Care Topical Primary Dressing Secondary Dressing Zetuvit Plus Silicone Border Dressing 5x5 (in/in) Discharge Instruction: Apply silicone border over primary dressing as directed to protect the area Secured With Compression Wrap Compression  Stockings Add-Ons Electronic Signature(s) Signed: 03/05/2022 5:53:09 PM By: Deon Pilling RN, BSN Entered By: Deon Pilling on 03/04/2022 14:38:05 -------------------------------------------------------------------------------- Wound Assessment Details Patient Name: Date of Service: [name] 03/04/2022 1:45 PM Medical Record Number: [id number] Patient Account Number: [id number] Date of Birth/Sex: Treating RN: June 21, 1945 (76 y.o. [name], [name] Primary Care Parth Mccormac: Fonnie Jarvis Other Clinician: Referring Asaph Serena: Treating Dottie Vaquerano/Extender: Dorann Ou in Treatment: 13 Wound Status Wound Number: 5 Primary Pressure Ulcer Etiology: Wound Location: Left Elbow Wound Open Wounding Event: Pressure Injury Status: Date Acquired: 03/04/2022 Comorbid Cataracts, Chronic sinus problems/congestion, Middle ear Weeks Of Treatment: 0 History: problems, Anemia, Asthma, Osteoarthritis, Quadriplegia Clustered Wound: No Wound Measurements Length: (cm) 0.8 Width: (cm) 0.7 Depth: (cm) 0.2 Area: (cm) 0.44 Volume: (cm) 0.088 % Reduction in Area: % Reduction in Volume: Epithelialization: None Tunneling: No Undermining: No Wound Description Classification: Category/Stage II Wound Margin: Distinct, outline attached Exudate Amount: Medium Exudate Type: Serosanguineous Exudate Color: red, brown Foul Odor After Cleansing: No Slough/Fibrino No Wound Bed Granulation Amount: Large (67-100%) Exposed ALLYSSA, ABRUZZESE ([id number]) [id number] Page 15 of 16 Granulation Quality: Red Fascia Exposed: No Fat Layer (Subcutaneous Tissue) Exposed: No Tendon Exposed: No Muscle Exposed: No Joint Exposed: No Bone Exposed: No Periwound Skin Texture Texture Color No Abnormalities Noted: No No Abnormalities Noted: No Callus: No Atrophie Blanche: No Crepitus: No Cyanosis: No Excoriation: No Ecchymosis:  No Induration: No Erythema: No Rash: No Hemosiderin Staining: No Scarring: No Mottled: No Pallor: No Moisture Rubor: No No Abnormalities Noted: No Dry / Scaly: No Temperature / Pain Maceration: No Temperature: No Abnormality Tenderness on Palpation: Yes Treatment Notes Wound #5 (Elbow) Wound Laterality: Left Cleanser Peri-Wound Care Topical Primary Dressing Secondary Dressing Zetuvit Plus Silicone Border Dressing 5x5 (in/in) Discharge Instruction: Apply silicone border over primary dressing as directed to protect the area Secured With Compression Wrap Compression Stockings Add-Ons Electronic Signature(s) Signed: 03/04/2022 4:10:29 PM By: Rhae Hammock RN Entered By: Rhae Hammock on 03/04/2022 15:27:44 -------------------------------------------------------------------------------- Vitals Details Patient Name: Date of Service: [name] 03/04/2022 1:45 PM Medical Record Number: [id number] Patient Account Number: [id number] Date of Birth/Sex: Treating RN: 09/21/1945 (76 y.o. F) Primary Care Meerab Maselli: Lenor Derrick,  Brooke Other Clinician: Referring Leigha Olberding: Treating Tymarion Everard/Extender: Dorann Ou in Treatment: 13 Vital Signs Time Taken: 14:07 Temperature (F): 98.4 Height (in): 64 Pulse (bpm): 51 Weight (lbs): 110 Respiratory Rate (breaths/min): 16 Body Mass Index (BMI): 18.9 Blood Pressure (mmHg): 107/71 Reference Range: 80 - 120 mg / dl Electronic Signature(s) EUGENA, RHUE ([id number]) [phone].pdf Page 16 of 16 Signed: 03/05/2022 3:56:34 PM By: Erenest Blank Entered By: Erenest Blank on 03/04/2022 14:07:21

## 2022-03-18 ENCOUNTER — Encounter (HOSPITAL_BASED_OUTPATIENT_CLINIC_OR_DEPARTMENT_OTHER): Payer: PPO | Admitting: Internal Medicine

## 2022-03-18 DIAGNOSIS — L89153 Pressure ulcer of sacral region, stage 3: Secondary | ICD-10-CM

## 2022-03-18 DIAGNOSIS — L89012 Pressure ulcer of right elbow, stage 2: Secondary | ICD-10-CM

## 2022-03-18 DIAGNOSIS — G8250 Quadriplegia, unspecified: Secondary | ICD-10-CM | POA: Diagnosis not present

## 2022-03-18 DIAGNOSIS — L89022 Pressure ulcer of left elbow, stage 2: Secondary | ICD-10-CM | POA: Diagnosis not present

## 2022-03-19 ENCOUNTER — Encounter (HOSPITAL_BASED_OUTPATIENT_CLINIC_OR_DEPARTMENT_OTHER): Payer: PPO | Admitting: Internal Medicine

## 2022-03-19 NOTE — Progress Notes (Signed)
Erin, [name] ([id number]) [id number] Page 1 of 13 Visit Report for 03/18/2022 Arrival Information Details Patient Name: Date of Service: [name], [name] 03/18/2022 1:30 PM Medical Record Number: [id number] Patient Account Number: [id number] Date of Birth/Sex: Treating RN: 08-15-1945 (76 y.o. [name] Primary Care Jeanenne Licea: Fonnie Jarvis Other Clinician: Referring Naidelin Gugliotta: Treating Kamonte Mcmichen/Extender: Dorann Ou in Treatment: 15 Visit Information History Since Last Visit Added or deleted any medications: No Patient Arrived: Wheel Chair Any new allergies or adverse reactions: No Arrival Time: 13:53 Had a fall or experienced change in No Accompanied By: caregiver activities of daily living that may affect Transfer Assistance: Harrel Lemon Lift risk of falls: Patient Identification Verified: Yes Signs or symptoms of abuse/neglect since last visito No Secondary Verification Process Completed: Yes Hospitalized since last visit: No Patient Requires Transmission-Based Precautions: No Implantable device outside of the clinic excluding No Patient Has Alerts: No cellular tissue based products placed in the center since last visit: Has Dressing in Place as Prescribed: Yes Pain Present Now: No Electronic Signature(s) Signed: 03/19/2022 5:04:15 PM By: Sharyn Creamer RN, BSN Entered By: Sharyn Creamer on 03/18/2022 13:54:08 -------------------------------------------------------------------------------- Encounter Discharge Information Details Patient Name: Date of Service: [name] 03/18/2022 1:30 PM Medical Record Number: [id number] Patient Account Number: [id number] Date of Birth/Sex: Treating RN: [birth year] (76 y.o. [name] Primary Care Chakia Counts: Fonnie Jarvis Other Clinician: Referring Anis Degidio: Treating Seri Kimmer/Extender: Dorann Ou in Treatment:  15 Encounter Discharge Information Items Post Procedure Vitals Discharge Condition: Stable Unable to obtain vitals Reason: enzymatic debrider ointment used. Ambulatory Status: Wheelchair Discharge Destination: Home Transportation: Private Auto Accompanied By: caregiver Schedule Follow-up Appointment: Yes Clinical Summary of Care: Electronic Signature(s) Signed: 03/18/2022 6:45:57 PM By: Deon Pilling RN, BSN Entered By: Deon Pilling on 03/18/2022 14:28:56 Marlaine Hind ([id number]) [id number] Page 2 of 13 -------------------------------------------------------------------------------- Lower Extremity Assessment Details Patient Name: Date of Service: [name], [name] 03/18/2022 1:30 PM Medical Record Number: [id number] Patient Account Number: [id number] Date of Birth/Sex: Treating RN: [birth year] (76 y.o. [name] Primary Care Trenae Brunke: Fonnie Jarvis Other Clinician: Referring Laveta Gilkey: Treating Emmery Seiler/Extender: Dorann Ou in Treatment: 15 Edema Assessment Assessed: [Left: No] Patrice Paradise: No] Edema: [Left: Yes] [Right: Yes] Calf Left: Right: Point of Measurement: 30 cm From Medial Instep 28 cm 27 cm Ankle Left: Right: Point of Measurement: 9 cm From Medial Instep 20 cm 20 cm Vascular Assessment Pulses: Dorsalis Pedis Palpable: [Left:Yes] [Right:Yes] Electronic Signature(s) Signed: 03/19/2022 5:04:15 PM By: Sharyn Creamer RN, BSN Entered By: Sharyn Creamer on 03/18/2022 14:02:04 -------------------------------------------------------------------------------- Multi Wound Chart Details Patient Name: Date of Service: [name] 03/18/2022 1:30 PM Medical Record Number: [id number] Patient Account Number: [id number] Date of Birth/Sex: Treating RN: 10/19/1945 (76 y.o. F) Primary Care [name]: Fonnie Jarvis Other Clinician: Referring Cythia Bachtel: Treating Glorimar Stroope/Extender: Dorann Ou in Treatment: 15 Vital Signs Height(in): 64 Pulse(bpm): 75 Weight(lbs): 110 Blood Pressure(mmHg): 101/69 Body Mass Index(BMI): 18.9 Temperature(F): 98.8 Respiratory Rate(breaths/min): 18 [1:Photos:] [[id number] Page 3 of 13] Sacrum Right Elbow Right Calcaneus Wound Location: Pressure Injury Pressure Injury Pressure Injury Wounding Event: Pressure Ulcer Pressure Ulcer Pressure Ulcer Primary Etiology: Cataracts, Chronic sinus Cataracts, Chronic sinus Cataracts, Chronic sinus Comorbid History: problems/congestion, Middle ear problems/congestion, Middle ear problems/congestion, Middle ear problems, Anemia, Asthma, problems, Anemia, Asthma, problems, Anemia, Asthma, Osteoarthritis, Quadriplegia Osteoarthritis, Quadriplegia Osteoarthritis, Quadriplegia 11/02/2021 02/25/2022 02/25/2022 Date Acquired: _0 Weeks of Treatment: Open Healed - Epithelialized Healed - Epithelialized Wound Status: No No  No Wound Recurrence: Yes No No Clustered Wound: 1 N/A N/A Clustered Quantity: 1.7x0.7x0.2 0x0x0 0x0x0 Measurements L x W x D (cm) 0.935 0 0 A (cm) : rea 0.187 0 0 Volume (cm) : 94.00% 100.00% 100.00% % Reduction in A rea: 88.10% 100.00% 100.00% % Reduction in Volume: Category/Stage III Category/Stage II Unstageable/Unclassified Classification: Medium Medium Medium Exudate A mount: Serosanguineous Serosanguineous Serosanguineous Exudate Type: red, brown red, brown red, brown Exudate Color: Distinct, outline attached Distinct, outline attached Distinct, outline attached Wound Margin: Large (67-100%) None Present (0%) None Present (0%) Granulation A mount: Red N/A N/A Granulation Quality: Small (1-33%) Large (67-100%) Large (67-100%) Necrotic A mount: Eschar, Adherent Slough Eschar Eschar Necrotic Tissue: Fascia: No Fascia: No Fascia: No Exposed Structures: Fat Layer (Subcutaneous Tissue): No Fat Layer  (Subcutaneous Tissue): No Fat Layer (Subcutaneous Tissue): No Tendon: No Tendon: No Tendon: No Muscle: No Muscle: No Muscle: No Joint: No Joint: No Joint: No Bone: No Bone: No Bone: No Large (67-100%) Large (67-100%) None Epithelialization: Excoriation: No Excoriation: No Excoriation: No Periwound Skin Texture: Induration: No Induration: No Induration: No Callus: No Callus: No Callus: No Crepitus: No Crepitus: No Crepitus: No Rash: No Rash: No Rash: No Scarring: No Scarring: No Scarring: No Maceration: No Maceration: No Maceration: No Periwound Skin Moisture: Dry/Scaly: No Dry/Scaly: No Dry/Scaly: No Atrophie Blanche: No Atrophie Blanche: No Atrophie Blanche: No Periwound Skin Color: Cyanosis: No Cyanosis: No Cyanosis: No Ecchymosis: No Ecchymosis: No Ecchymosis: No Erythema: No Erythema: No Erythema: No Hemosiderin Staining: No Hemosiderin Staining: No Hemosiderin Staining: No Mottled: No Mottled: No Mottled: No Pallor: No Pallor: No Pallor: No Rubor: No Rubor: No Rubor: No N/A N/A N/A Temperature: Wound Number: 4 5 N/A Photos: N/A Left Calcaneus Left Elbow N/A Wound Location: Blister Pressure Injury N/A Wounding Event: Pressure Ulcer Pressure Ulcer N/A Primary Etiology: Cataracts, Chronic sinus Cataracts, Chronic sinus N/A Comorbid History: problems/congestion, Middle ear problems/congestion, Middle ear problems, Anemia, Asthma, problems, Anemia, Asthma, Osteoarthritis, Quadriplegia Osteoarthritis, Quadriplegia 02/25/2022 03/04/2022 N/A Date Acquired: 2 2 N/A Weeks of Treatment: Open Open N/A Wound Status: No No N/A Wound Recurrence: No No N/A Clustered Wound: N/A N/A N/A Clustered Quantity: 0x0x0 0x0x0 N/A Measurements L x W x D (cm) 0 0 N/A A (cm) : rea 0 0 N/A Volume (cm) : 100.00% 100.00% N/A % Reduction in A rea: 100.00% 100.00% N/A % Reduction in Volume: Category/Stage II Category/Stage II  N/A Classification: Medium None Present N/A Exudate A mount: Serosanguineous N/A N/A Exudate Type: red, brown N/A N/A Exudate Color: Distinct, outline attached Distinct, outline attached N/A Wound MarginANOUK, CRITZER ([id number]) [id number] Page 4 of 13 None Present (0%) None Present (0%) N/A Granulation Amount: N/A N/A N/A Granulation Quality: None Present (0%) None Present (0%) N/A Necrotic Amount: N/A N/A N/A Necrotic Tissue: Fascia: No Fascia: No N/A Exposed Structures: Fat Layer (Subcutaneous Tissue): No Fat Layer (Subcutaneous Tissue): No Tendon: No Tendon: No Muscle: No Muscle: No Joint: No Joint: No Bone: No Bone: No None Large (67-100%) N/A Epithelialization: Excoriation: No Excoriation: No N/A Periwound Skin Texture: Induration: No Induration: No Callus: No Callus: No Crepitus: No Crepitus: No Rash: No Rash: No Scarring: No Scarring: No Maceration: No Maceration: No N/A Periwound Skin Moisture: Dry/Scaly: No Dry/Scaly: No Atrophie Blanche: No Atrophie Blanche: No N/A Periwound Skin Color: Cyanosis: No Cyanosis: No Ecchymosis: No Ecchymosis: No Erythema: No Erythema: No Hemosiderin Staining: No Hemosiderin Staining: No Mottled: No Mottled: No Pallor: No Pallor: No Rubor: No Rubor: No N/A No Abnormality  N/A Temperature: N/A Yes N/A Tenderness on Palpation: Treatment Notes Electronic Signature(s) Signed: 03/18/2022 2:58:01 PM By: Kalman Shan DO Entered By: Kalman Shan on 03/18/2022 14:25:37 -------------------------------------------------------------------------------- Multi-Disciplinary Care Plan Details Patient Name: Date of Service: [name] 03/18/2022 1:30 PM Medical Record Number: [id number] Patient Account Number: [id number] Date of Birth/Sex: Treating RN: 03/22/1946 (76 y.o. [name], [name] Primary Care Clois Treanor: Fonnie Jarvis Other Clinician: Referring  Kailynn Satterly: Treating Darcelle Herrada/Extender: Dorann Ou in Treatment: 15 Active Inactive Nutrition Nursing Diagnoses: Potential for alteratiion in Nutrition/Potential for imbalanced nutrition Goals: Patient/caregiver agrees to and verbalizes understanding of need to obtain nutritional consultation Date Initiated: 12/03/2021 Target Resolution Date: 03/23/2022 Goal Status: Active Interventions: Assess patient nutrition upon admission and as needed per policy Provide education on nutrition Treatment Activities: Education provided on Nutrition : 02/18/2022 Patient referred to Primary Care Physician for further nutritional evaluation : 12/03/2021 Notes: Pain, Acute or Chronic [name], [name] ([id number]) [phone].pdf Page 5 of 13 Nursing Diagnoses: Pain, acute or chronic: actual or potential Potential alteration in comfort, pain Goals: Patient will verbalize adequate pain control and receive pain control interventions during procedures as needed Date Initiated: 12/03/2021 Target Resolution Date: 03/23/2022 Goal Status: Active Patient/caregiver will verbalize comfort level met Date Initiated: 12/03/2021 Target Resolution Date: 03/23/2022 Goal Status: Active Interventions: Encourage patient to take pain medications as prescribed Provide education on pain management Reposition patient for comfort Treatment Activities: Administer pain control measures as ordered : 12/03/2021 Notes: Pressure Nursing Diagnoses: Knowledge deficit related to management of pressures ulcers Potential for impaired tissue integrity related to pressure, friction, moisture, and shear Goals: Patient will remain free from development of additional pressure ulcers Date Initiated: 12/03/2021 Target Resolution Date: 03/23/2022 Goal Status: Active Interventions: Assess: immobility, friction, shearing, incontinence upon admission and as needed Assess offloading  mechanisms upon admission and as needed Provide education on pressure ulcers Treatment Activities: Patient referred for seating evaluation to ensure proper offloading : 12/03/2021 T ordered outside of clinic : 12/03/2021 est Notes: Electronic Signature(s) Signed: 03/18/2022 6:45:57 PM By: Deon Pilling RN, BSN Entered By: Deon Pilling on 03/18/2022 14:27:49 -------------------------------------------------------------------------------- Pain Assessment Details Patient Name: Date of Service: [name] 03/18/2022 1:30 PM Medical Record Number: [id number] Patient Account Number: [id number] Date of Birth/Sex: Treating RN: 1945-08-27 (76 y.o. [name] Primary Care Malasha Kleppe: Fonnie Jarvis Other Clinician: Referring Renell Allum: Treating Delita Chiquito/Extender: Dorann Ou in Treatment: 15 Active Problems Location of Pain Severity and Description of Pain Patient Has Paino No Site Locations [place], [place] ([id number]) [id number] Page 6 of 13 Pain Management and Medication Current Pain Management: Electronic Signature(s) Signed: 03/19/2022 5:04:15 PM By: Sharyn Creamer RN, BSN Entered By: Sharyn Creamer on 03/18/2022 13:54:37 -------------------------------------------------------------------------------- Patient/Caregiver Education Details Patient Name: Date of Service: [name] 11/27/2023andnbsp1:30 PM Medical Record Number: [id number] Patient Account Number: [id number] Date of Birth/Gender: Treating RN: Sep 07, 1945 (76 y.o. [name] Primary Care Physician: Fonnie Jarvis Other Clinician: Referring Physician: Treating Physician/Extender: Dorann Ou in Treatment: 15 Education Assessment Education Provided To: Patient Education Topics Provided Pressure: Handouts: Pressure Ulcers: Care and Offloading Methods: Explain/Verbal Responses: Reinforcements  needed Electronic Signature(s) Signed: 03/18/2022 6:45:57 PM By: Deon Pilling RN, BSN Entered By: Deon Pilling on 03/18/2022 14:28:01 -------------------------------------------------------------------------------- Wound Assessment Details Patient Name: Date of Service: [name] 03/18/2022 1:30 PM Medical Record Number: [id number] Patient Account Number: [id number] Date of Birth/Sex: Treating RN: 1945-05-29 (76 y.o. [name] Primary Care Rolene Andrades: Fonnie Jarvis Other Clinician: Cheree Ditto,  Gloriana ([id number]) [id number] Page 7 of 13 Referring Aidynn Krenn: Treating Caoilainn Sacks/Extender: Dorann Ou in Treatment: 15 Wound Status Wound Number: 1 Primary Pressure Ulcer Etiology: Wound Location: Sacrum Wound Open Wounding Event: Pressure Injury Status: Date Acquired: 11/02/2021 Comorbid Cataracts, Chronic sinus problems/congestion, Middle ear Weeks Of Treatment: 15 History: problems, Anemia, Asthma, Osteoarthritis, Quadriplegia Clustered Wound: Yes Photos Wound Measurements Length: (cm) Width: (cm) Depth: (cm) Clustered Quantity: Area: (cm) Volume: (cm) 1.7 % Reduction in Area: 94% 0.7 % Reduction in Volume: 88.1% 0.2 Epithelialization: Large (67-100%) 1 Tunneling: No 0.935 Undermining: No 0.187 Wound Description Classification: Category/Stage III Wound Margin: Distinct, outline attached Exudate Amount: Medium Exudate Type: Serosanguineous Exudate Color: red, brown Foul Odor After Cleansing: No Slough/Fibrino Yes Wound Bed Granulation Amount: Large (67-100%) Exposed Structure Granulation Quality: Red Fascia Exposed: No Necrotic Amount: Small (1-33%) Fat Layer (Subcutaneous Tissue) Exposed: No Necrotic Quality: Eschar, Adherent Slough Tendon Exposed: No Muscle Exposed: No Joint Exposed: No Bone Exposed: No Periwound Skin Texture Texture Color No Abnormalities Noted: No No  Abnormalities Noted: No Callus: No Atrophie Blanche: No Crepitus: No Cyanosis: No Excoriation: No Ecchymosis: No Induration: No Erythema: No Rash: No Hemosiderin Staining: No Scarring: No Mottled: No Pallor: No Moisture Rubor: No No Abnormalities Noted: No Dry / Scaly: No Maceration: No Treatment Notes Wound #1 (Sacrum) Cleanser Soap and Water Discharge Instruction: May shower and wash wound with dial antibacterial soap and water prior to dressing change. Wound Cleanser Discharge Instruction: Cleanse the wound with wound cleanser prior to applying a clean dressing using gauze sponges, not tissue or cotton balls. SULMA, RUFFINO ([id number]) [id number].pdf Page 8 of 13 Peri-Wound Care Skin Prep Discharge Instruction: Use skin prep as directed Zinc Oxide Ointment 30g tube Discharge Instruction: Apply Zinc Oxide to periwound with each dressing change Topical Primary Dressing Hydrofera Blue Ready Foam, 4x5 in Discharge Instruction: Apply to wound bed as instructed Santyl Ointment Discharge Instruction: Apply nickel thick amount to wound bed as instructed Secondary Dressing Zetuvit Plus Silicone Border Dressing 7x7(in/in) Discharge Instruction: Apply silicone border over primary dressing as directed. Secured With Compression Wrap Compression Stockings Environmental education officer) Signed: 03/19/2022 4:06:07 PM By: Erenest [name] Signed: 03/19/2022 5:04:15 PM By: Sharyn Creamer RN, BSN Entered By: Erenest [name] on 03/18/2022 14:10:28 -------------------------------------------------------------------------------- Wound Assessment Details Patient Name: Date of Service: [name] 03/18/2022 1:30 PM Medical Record Number: [id number] Patient Account Number: [id number] Date of Birth/Sex: Treating RN: Nov 26, 1945 (76 y.o. [name], Meta.Reding Primary Care Parthenia Tellefsen: Fonnie Jarvis Other Clinician: Referring Kensly Bowmer: Treating  Aislin Onofre/Extender: Dorann Ou in Treatment: 15 Wound Status Wound Number: 2 Primary Pressure Ulcer Etiology: Wound Location: Right Elbow Wound Healed - Epithelialized Wounding Event: Pressure Injury Status: Date Acquired: 02/25/2022 Comorbid Cataracts, Chronic sinus problems/congestion, Middle ear Weeks Of Treatment: 2 History: problems, Anemia, Asthma, Osteoarthritis, Quadriplegia Clustered Wound: No Photos Wound Measurements Length: (cm) Width: (cm) Depth: (cm) Area: (cm) Gullo, Kinlee ([id number]) Volume: (cm) 0 % Reduction in Area: 100% 0 % Reduction in Volume: 100% 0 Epithelialization: Large (67-100%) 0 Tunneling: No [id number] Page 9 of 13 0 Undermining: No Wound Description Classification: Category/Stage II Wound Margin: Distinct, outline attached Exudate Amount: Medium Exudate Type: Serosanguineous Exudate Color: red, brown Foul Odor After Cleansing: No Slough/Fibrino Yes Wound Bed Granulation Amount: None Present (0%) Exposed Structure Necrotic Amount: Large (67-100%) Fascia Exposed: No Necrotic Quality: Eschar Fat Layer (Subcutaneous Tissue) Exposed: No Tendon Exposed: No Muscle Exposed: No Joint Exposed: No Bone Exposed: No Periwound Skin Texture Texture Color No  Abnormalities Noted: No No Abnormalities Noted: No Callus: No Atrophie Blanche: No Crepitus: No Cyanosis: No Excoriation: No Ecchymosis: No Induration: No Erythema: No Rash: No Hemosiderin Staining: No Scarring: No Mottled: No Pallor: No Moisture Rubor: No No Abnormalities Noted: No Dry / Scaly: No Maceration: No Treatment Notes Wound #2 (Elbow) Wound Laterality: Right Cleanser Peri-Wound Care Topical Primary Dressing Secondary Dressing Secured With Compression Wrap Compression Stockings Add-Ons Electronic Signature(s) Signed: 03/18/2022 6:45:57 PM By: Deon Pilling RN, BSN Entered By: Deon Pilling on  03/18/2022 14:24:04 -------------------------------------------------------------------------------- Wound Assessment Details Patient Name: Date of Service: [name] 03/18/2022 1:30 PM Medical Record Number: [id number] Patient Account Number: [id number] Date of Birth/Sex: Treating RN: March 31, 1946 (76 y.o. [name], [name] Primary Care Martrell Eguia: Fonnie Jarvis Other Clinician: Referring Burnett Spray: Treating Mckensi Redinger/Extender: Aloha Gell Weeks in Treatment: 15 Wound Status Wound Number: 3 Primary Pressure Ulcer [name], [name] ([id number]) [phone].pdf Page 10 of 13 Etiology: Wound Location: Right Calcaneus Wound Healed - Epithelialized Wounding Event: Pressure Injury Status: Date Acquired: 02/25/2022 Comorbid Cataracts, Chronic sinus problems/congestion, Middle ear Weeks Of Treatment: 2 History: problems, Anemia, Asthma, Osteoarthritis, Quadriplegia Clustered Wound: No Photos Wound Measurements Length: (cm) Width: (cm) Depth: (cm) Area: (cm) Volume: (cm) 0 % Reduction in Area: 100% 0 % Reduction in Volume: 100% 0 Epithelialization: None 0 Tunneling: No 0 Undermining: No Wound Description Classification: Unstageable/Unclassified Wound Margin: Distinct, outline attached Exudate Amount: Medium Exudate Type: Serosanguineous Exudate Color: red, brown Foul Odor After Cleansing: No Slough/Fibrino Yes Wound Bed Granulation Amount: None Present (0%) Exposed Structure Necrotic Amount: Large (67-100%) Fascia Exposed: No Necrotic Quality: Eschar Fat Layer (Subcutaneous Tissue) Exposed: No Tendon Exposed: No Muscle Exposed: No Joint Exposed: No Bone Exposed: No Periwound Skin Texture Texture Color No Abnormalities Noted: No No Abnormalities Noted: No Callus: No Atrophie Blanche: No Crepitus: No Cyanosis: No Excoriation: No Ecchymosis: No Induration: No Erythema: No Rash: No Hemosiderin Staining:  No Scarring: No Mottled: No Pallor: No Moisture Rubor: No No Abnormalities Noted: No Dry / Scaly: No Maceration: No Treatment Notes Wound #3 (Calcaneus) Wound Laterality: Right Cleanser Peri-Wound Care Topical Primary Dressing Secondary Dressing Secured With Compression Wrap Compression Stockings Mascotte, [name] [name] ([id number]) [phone].pdf Page 11 of 13 Add-Ons Electronic Signature(s) Signed: 03/18/2022 6:45:57 PM By: Deon Pilling RN, BSN Entered By: Deon Pilling on 03/18/2022 14:24:04 -------------------------------------------------------------------------------- Wound Assessment Details Patient Name: Date of Service: [name] 03/18/2022 1:30 PM Medical Record Number: [id number] Patient Account Number: [id number] Date of Birth/Sex: Treating RN: 1945/10/25 (76 y.o. [name] Primary Care Oluwatomiwa Kinyon: Fonnie Jarvis Other Clinician: Referring Coryn Mosso: Treating Isatu Macinnes/Extender: Dorann Ou in Treatment: 15 Wound Status Wound Number: 4 Primary Pressure Ulcer Etiology: Wound Location: Left Calcaneus Wound Open Wounding Event: Blister Status: Date Acquired: 02/25/2022 Comorbid Cataracts, Chronic sinus problems/congestion, Middle ear Weeks Of Treatment: 2 History: problems, Anemia, Asthma, Osteoarthritis, Quadriplegia Clustered Wound: No Photos Wound Measurements Length: (cm) Width: (cm) Depth: (cm) Area: (cm) Volume: (cm) 0 % Reduction in Area: 100% 0 % Reduction in Volume: 100% 0 Epithelialization: None 0 Tunneling: No 0 Undermining: No Wound Description Classification: Category/Stage II Wound Margin: Distinct, outline attached Exudate Amount: Medium Exudate Type: Serosanguineous Exudate Color: red, brown Foul Odor After Cleansing: No Slough/Fibrino No Wound Bed Granulation Amount: None Present (0%) Exposed Structure Necrotic Amount: None Present (0%) Fascia Exposed:  No Fat Layer (Subcutaneous Tissue) Exposed: No Tendon Exposed: No Muscle Exposed: No Joint Exposed: No Bone Exposed: No Periwound Skin Texture Texture Color No Abnormalities Noted: No  No Abnormalities Noted: No Callus: No Atrophie Blanche: No Crepitus: No Cyanosis: No [name], [name] ([id number]) [id number].pdf Page 12 of 13 Excoriation: No Ecchymosis: No Induration: No Erythema: No Rash: No Hemosiderin Staining: No Scarring: No Mottled: No Pallor: No Moisture Rubor: No No Abnormalities Noted: No Dry / Scaly: No Maceration: No Electronic Signature(s) Signed: 03/19/2022 4:06:07 PM By: Erenest [name] Signed: 03/19/2022 5:04:15 PM By: Sharyn Creamer RN, BSN Entered By: Erenest [name] on 03/18/2022 14:12:56 -------------------------------------------------------------------------------- Wound Assessment Details Patient Name: Date of Service: [name] 03/18/2022 1:30 PM Medical Record Number: [id number] Patient Account Number: [id number] Date of Birth/Sex: Treating RN: 04/26/1945 (76 y.o. [name] Primary Care Duayne Brideau: Fonnie Jarvis Other Clinician: Referring Melford Tullier: Treating Aileene Lanum/Extender: Dorann Ou in Treatment: 15 Wound Status Wound Number: 5 Primary Pressure Ulcer Etiology: Wound Location: Left Elbow Wound Open Wounding Event: Pressure Injury Status: Date Acquired: 03/04/2022 Comorbid Cataracts, Chronic sinus problems/congestion, Middle ear Weeks Of Treatment: 2 History: problems, Anemia, Asthma, Osteoarthritis, Quadriplegia Clustered Wound: No Photos Wound Measurements Length: (cm) Width: (cm) Depth: (cm) Area: (cm) Volume: (cm) 0 % Reduction in Area: 100% 0 % Reduction in Volume: 100% 0 Epithelialization: Large (67-100%) 0 Tunneling: No 0 Undermining: No Wound Description Classification: Category/Stage II Wound Margin: Distinct, outline attached Exudate  Amount: None Present Foul Odor After Cleansing: No Slough/Fibrino No Wound Bed Granulation Amount: None Present (0%) Exposed Structure Necrotic Amount: None Present (0%) Fascia Exposed: No Fat Layer (Subcutaneous Tissue) Exposed: No Tendon Exposed: No Muscle Exposed: No Joint Exposed: No Bone Exposed: No Blakely, [name] ([id number]) [id number] Page 13 of 13 Periwound Skin Texture Texture Color No Abnormalities Noted: No No Abnormalities Noted: No Callus: No Atrophie Blanche: No Crepitus: No Cyanosis: No Excoriation: No Ecchymosis: No Induration: No Erythema: No Rash: No Hemosiderin Staining: No Scarring: No Mottled: No Pallor: No Moisture Rubor: No No Abnormalities Noted: No Dry / Scaly: No Temperature / Pain Maceration: No Temperature: No Abnormality Tenderness on Palpation: Yes Electronic Signature(s) Signed: 03/19/2022 4:06:07 PM By: Erenest [name] Signed: 03/19/2022 5:04:15 PM By: Sharyn Creamer RN, BSN Entered By: Erenest [name] on 03/18/2022 14:11:07 -------------------------------------------------------------------------------- Vitals Details Patient Name: Date of Service: [name] 03/18/2022 1:30 PM Medical Record Number: [id number] Patient Account Number: [id number] Date of Birth/Sex: Treating RN: 1945/06/25 (76 y.o. [name] Primary Care Shady Bradish: Fonnie Jarvis Other Clinician: Referring Dalene Robards: Treating Miller Limehouse/Extender: Dorann Ou in Treatment: 15 Vital Signs Time Taken: 13:54 Temperature (F): 98.8 Height (in): 64 Pulse (bpm): 75 Weight (lbs): 110 Respiratory Rate (breaths/min): 18 Body Mass Index (BMI): 18.9 Blood Pressure (mmHg): 101/69 Reference Range: 80 - 120 mg / dl Electronic Signature(s) Signed: 03/19/2022 5:04:15 PM By: Sharyn Creamer RN, BSN Entered By: Sharyn Creamer on 03/18/2022 13:54:28
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
## 2022-04-01 ENCOUNTER — Encounter (HOSPITAL_BASED_OUTPATIENT_CLINIC_OR_DEPARTMENT_OTHER): Payer: PPO | Attending: Internal Medicine | Admitting: Internal Medicine

## 2022-04-01 DIAGNOSIS — L89623 Pressure ulcer of left heel, stage 3: Secondary | ICD-10-CM | POA: Insufficient documentation

## 2022-04-01 DIAGNOSIS — G35 Multiple sclerosis: Secondary | ICD-10-CM | POA: Diagnosis not present

## 2022-04-01 DIAGNOSIS — G8250 Quadriplegia, unspecified: Secondary | ICD-10-CM | POA: Insufficient documentation

## 2022-04-01 DIAGNOSIS — I442 Atrioventricular block, complete: Secondary | ICD-10-CM | POA: Insufficient documentation

## 2022-04-01 DIAGNOSIS — Z993 Dependence on wheelchair: Secondary | ICD-10-CM | POA: Diagnosis not present

## 2022-04-01 DIAGNOSIS — L89153 Pressure ulcer of sacral region, stage 3: Secondary | ICD-10-CM | POA: Diagnosis present

## 2022-04-01 DIAGNOSIS — Z950 Presence of cardiac pacemaker: Secondary | ICD-10-CM | POA: Diagnosis not present

## 2022-04-04 NOTE — Progress Notes (Signed)
BURNELL, MATLIN ([id number]) [id number] Page 1 of 12 Visit Report for 04/01/2022 Arrival Information Details Patient Name: Date of Service: [name], [name] RLO TTE 04/01/2022 1:30 PM Medical Record Number: [id number] Patient Account Number: [id number] Date of Birth/Sex: Treating RN: 1946/01/08 (76 y.o. F) Primary Care Rumeal Cullipher: Fonnie Jarvis Other Clinician: Referring Aldina Porta: Treating Talayah Picardi/Extender: Dorann Ou in Treatment: 49 Visit Information History Since Last Visit Added or deleted any medications: No Patient Arrived: Wheel Chair Any new allergies or adverse reactions: No Arrival Time: 14:28 Had a fall or experienced change in No Accompanied By: caregiver activities of daily living that may affect Transfer Assistance: Harrel Lemon Lift risk of falls: Patient Identification Verified: Yes Signs or symptoms of abuse/neglect since last visito No Secondary Verification Process Completed: Yes Hospitalized since last visit: No Patient Requires Transmission-Based Precautions: No Implantable device outside of the clinic excluding No Patient Has Alerts: No cellular tissue based products placed in the center since last visit: Has Dressing in Place as Prescribed: Yes Pain Present Now: Yes Electronic Signature(s) Signed: 04/03/2022 12:00:46 PM By: Erenest Blank Entered By: Erenest Blank on 04/01/2022 14:28:47 -------------------------------------------------------------------------------- Clinic Level of Care Assessment Details Patient Name: Date of Service: [name], [name] RLO TTE 04/01/2022 1:30 PM Medical Record Number: [id number] Patient Account Number: [id number] Date of Birth/Sex: Treating RN: Apr 25, 1945 (76 y.o. [name], [name] Primary Care Larenz Frasier: Fonnie Jarvis Other Clinician: Referring Deloise Marchant: Treating Arilla Hice/Extender: Dorann Ou in Treatment: 17 Clinic Level of Care  Assessment Items TOOL 4 Quantity Score X- 1 0 Use when only an EandM is performed on FOLLOW-UP visit ASSESSMENTS - Nursing Assessment / Reassessment X- 1 10 Reassessment of Co-morbidities (includes updates in patient status) X- 1 5 Reassessment of Adherence to Treatment Plan ASSESSMENTS - Wound and Skin A ssessment / Reassessment X - Simple Wound Assessment / Reassessment - one wound 1 5 _0  - 0 Complex Wound Assessment / Reassessment - multiple wounds _1  - 0 Dermatologic / Skin Assessment (not related to wound area) ASSESSMENTS - Focused Assessment _2  - 0 Circumferential Edema Measurements - multi extremities _3  - 0 Nutritional Assessment / Counseling / Intervention SHALETHA, HUMBLE ([id number]) [id number] Page 2 of 12 _4  - 0 Lower Extremity Assessment (monofilament, tuning fork, pulses) _5  - 0 Peripheral Arterial Disease Assessment (using hand held doppler) ASSESSMENTS - Ostomy and/or Continence Assessment and Care _6  - 0 Incontinence Assessment and Management _7  - 0 Ostomy Care Assessment and Management (repouching, etc.) PROCESS - Coordination of Care X - Simple Patient / Family Education for ongoing care 1 15 _8  - 0 Complex (extensive) Patient / Family Education for ongoing care X- 1 10 Staff obtains Programmer, systems, Records, T Results / Process Orders est X- 1 10 Staff telephones HHA, Nursing Homes / Clarify orders / etc _9  - 0 Routine Transfer to another Facility (non-emergent condition) _10  - 0 Routine Hospital Admission (non-emergent condition) _11  - 0 New Admissions / Biomedical engineer / Ordering NPWT Apligraf, etc. , _12  - 0 Emergency Hospital Admission (emergent condition) X- 1 10 Simple Discharge Coordination _13  - 0 Complex (extensive) Discharge Coordination PROCESS - Special Needs _14  - 0 Pediatric / Minor Patient Management _15  - 0 Isolation Patient Management _16  - 0 Hearing / Language / Visual special needs _17  -  0 Assessment of Community assistance (transportation, D/C planning, etc.) _18  - 0 Additional assistance / Altered mentation _19  - 0 Support Surface(s) Assessment (bed, cushion, seat, etc.) INTERVENTIONS - Wound Cleansing / Measurement X - Simple Wound Cleansing - one wound 1  5 _0  - 0 Complex Wound Cleansing - multiple wounds X- 1 5 Wound Imaging (photographs - any number of wounds) _1  - 0 Wound Tracing (instead of photographs) X- 1 5 Simple Wound Measurement - one wound _2  - 0 Complex Wound Measurement - multiple wounds INTERVENTIONS - Wound Dressings _3  - 0 Small Wound Dressing one or multiple wounds X- 1 15 Medium Wound Dressing one or multiple wounds _4  - 0 Large Wound Dressing one or multiple wounds X- 1 5 Application of Medications - topical <WFUXNATFTDDUKGUR>_4<\/YHCWCBJSEGBTDVVO>_1  - 0 Application of Medications - injection INTERVENTIONS - Miscellaneous _6  - 0 External ear exam _7  - 0 Specimen Collection (cultures, biopsies, blood, body fluids, etc.) _8  - 0 Specimen(s) / Culture(s) sent or taken to Lab for analysis X- 1 10 Patient Transfer (multiple staff / Civil Service fast streamer / Similar devices) _9  - 0 Simple Staple / Suture removal (25 or less) _10  - 0 Complex Staple / Suture removal (26 or more) _11  - 0 Hypo / Hyperglycemic Management (close monitor of Blood Glucose) Bernick, Glorya ([id number]) [id number] Page 3 of 12 _12  - 0 Ankle / Brachial Index (ABI) - do not check if billed separately X- 1 5 Vital Signs Has the patient been seen at the hospital within the last three years: Yes Total Score: 115 Level Of Care: New/Established - Level 3 Electronic Signature(s) Signed: 04/04/2022 5:16:09 PM By: Rhae Hammock RN Entered By: Rhae Hammock on 04/01/2022 15:02:55 -------------------------------------------------------------------------------- Encounter Discharge Information Details Patient Name: Date of Service: [name] RLO TTE 04/01/2022 1:30 PM Medical Record  Number: [id number] Patient Account Number: [id number] Date of Birth/Sex: Treating RN: [birth year] (76 y.o. [name], [name] Primary Care Cheyenne Schumm: Fonnie Jarvis Other Clinician: Referring Asani Deniston: Treating Jabril Pursell/Extender: Dorann Ou in Treatment: 17 Encounter Discharge Information Items Post Procedure Vitals Discharge Condition: Stable Temperature (F): 98.7 Ambulatory Status: Wheelchair Pulse (bpm): 74 Discharge Destination: Home Respiratory Rate (breaths/min): 17 Transportation: Private Auto Blood Pressure (mmHg): 120/80 Accompanied By: self Schedule Follow-up Appointment: Yes Clinical Summary of Care: Patient Declined Electronic Signature(s) Signed: 04/04/2022 5:16:09 PM By: Rhae Hammock RN Entered By: Rhae Hammock on 04/01/2022 15:03:45 -------------------------------------------------------------------------------- Lower Extremity Assessment Details Patient Name: Date of Service: [name] RLO TTE 04/01/2022 1:30 PM Medical Record Number: [id number] Patient Account Number: [id number] Date of Birth/Sex: Treating RN: 11-May-1945 (76 y.o. [name], [name] Primary Care Mariely Mahr: Fonnie Jarvis Other Clinician: Referring Tullio Chausse: Treating Ramsey Guadamuz/Extender: Aloha Gell Weeks in Treatment: 17 Edema Assessment Assessed: Shirlyn Goltz: Yes] Patrice Paradise: No] Edema: [Left: Yes] [Right: Yes] Calf Left: Right: Point of Measurement: 30 cm From Medial Instep 28 cm 27 cm Ankle Left: Right: Point of Measurement: 9 cm From Medial Instep 20 cm 20 cm Vascular Assessment Hoehn, Viviene ([id number]) [Right:[id number] Page 4 of 12] Pulses: Dorsalis Pedis Palpable: [Left:Yes] Posterior Tibial Palpable: [Left:Yes] Electronic Signature(s) Signed: 04/04/2022 5:16:09 PM By: Rhae Hammock RN Entered By: Rhae Hammock on 04/01/2022  15:04:39 -------------------------------------------------------------------------------- Multi Wound Chart Details Patient Name: Date of Service: [name] RLO TTE 04/01/2022 1:30 PM Medical Record Number: [id number] Patient Account Number: [id number] Date of Birth/Sex: Treating RN: 10/03/1945 (76 y.o. F) Primary Care Kerra Guilfoil: Fonnie Jarvis Other Clinician: Referring Zymire Turnbo: Treating Meilech Virts/Extender: Dorann Ou in Treatment: 17 Vital Signs Height(in): 64 Pulse(bpm): 75 Weight(lbs): 110 Blood Pressure(mmHg): 126/86 Body Mass Index(BMI): 18.9 Temperature(F): 98.7 Respiratory Rate(breaths/min): 16 [1:Photos:] [6:No Photos] Sacrum Left Calcaneus Left Calcaneus Wound Location: Pressure Injury Blister Pressure Injury Wounding Event: Pressure Ulcer Pressure Ulcer Pressure Ulcer Primary Etiology: Cataracts, Chronic sinus  Cataracts, Chronic sinus Cataracts, Chronic sinus Comorbid History: problems/congestion, Middle ear problems/congestion, Middle ear problems/congestion, Middle ear problems, Anemia, Asthma, problems, Anemia, Asthma, problems, Anemia, Asthma, Osteoarthritis, Quadriplegia Osteoarthritis, Quadriplegia Osteoarthritis, Quadriplegia 11/02/2021 02/25/2022 04/01/2022 Date Acquired: 17 4 0 Weeks of Treatment: Open Open Open Wound Status: No Yes No Wound Recurrence: Yes No No Clustered Wound: 1 N/A N/A Clustered Quantity: 1.5x0.5x0.1 4.5x3.5x0.1 3.5x2x0.2 Measurements L x W x D (cm) 0.589 12.37 5.498 A (cm) : rea 0.059 1.237 1.1 Volume (cm) : 96.30% -80.00% N/A % Reduction in A rea: 96.20% -80.10% N/A % Reduction in Volume: Category/Stage III Category/Stage II Category/Stage III Classification: Medium Medium Medium Exudate A mount: Serosanguineous Serosanguineous Serosanguineous Exudate Type: red, brown red, brown red, brown Exudate Color: Distinct, outline attached Distinct, outline attached Distinct, outline  attached Wound Margin: Large (67-100%) Large (67-100%) Medium (34-66%) Granulation A mount: Red Red Red, Pink Granulation Quality: Small (1-33%) Small (1-33%) Medium (34-66%) Necrotic A mount: Fascia: No Fat Layer (Subcutaneous Tissue): Yes Fat Layer (Subcutaneous Tissue): Yes Exposed Structures: Fat Layer (Subcutaneous Tissue): No Fascia: No Fascia: No Tendon: No Tendon: No Tendon: No Muscle: No Muscle: No Muscle: No Joint: No Joint: No Joint: No Bone: No Bone: No Bone: No Large (67-100%) None None EpithelializationKATHYLEEN, RADICE ([id number]) [id number] Page 5 of 12 Excoriation: No Excoriation: No Excoriation: No Periwound Skin Texture: Induration: No Induration: No Induration: No Callus: No Callus: No Callus: No Crepitus: No Crepitus: No Crepitus: No Rash: No Rash: No Rash: No Scarring: No Scarring: No Scarring: No Maceration: No Maceration: No Maceration: No Periwound Skin Moisture: Dry/Scaly: No Dry/Scaly: No Dry/Scaly: No Atrophie Blanche: No Atrophie Blanche: No Atrophie Blanche: No Periwound Skin Color: Cyanosis: No Cyanosis: No Cyanosis: No Ecchymosis: No Ecchymosis: No Ecchymosis: No Erythema: No Erythema: No Erythema: No Hemosiderin Staining: No Hemosiderin Staining: No Hemosiderin Staining: No Mottled: No Mottled: No Mottled: No Pallor: No Pallor: No Pallor: No Rubor: No Rubor: No Rubor: No N/A N/A No Abnormality Temperature: Treatment Notes Electronic Signature(s) Signed: 04/01/2022 3:53:46 PM By: Kalman Shan DO Entered By: Kalman Shan on 04/01/2022 14:56:37 -------------------------------------------------------------------------------- Multi-Disciplinary Care Plan Details Patient Name: Date of Service: [name] RLO TTE 04/01/2022 1:30 PM Medical Record Number: [id number] Patient Account Number: [id number] Date of Birth/Sex: Treating RN: [birth year] (76 y.o. [name], [name] Primary Care Tahje Borawski: Fonnie Jarvis Other Clinician: Referring Sem Mccaughey: Treating Glenora Morocho/Extender: Dorann Ou in Treatment: 17 Active Inactive Nutrition Nursing Diagnoses: Potential for alteratiion in Nutrition/Potential for imbalanced nutrition Goals: Patient/caregiver agrees to and verbalizes understanding of need to obtain nutritional consultation Date Initiated: 12/03/2021 Target Resolution Date: 04/20/2022 Goal Status: Active Interventions: Assess patient nutrition upon admission and as needed per policy Provide education on nutrition Treatment Activities: Education provided on Nutrition : 03/04/2022 Patient referred to Primary Care Physician for further nutritional evaluation : 12/03/2021 Notes: Pain, Acute or Chronic Nursing Diagnoses: Pain, acute or chronic: actual or potential Potential alteration in comfort, pain Goals: Patient will verbalize adequate pain control and receive pain control interventions during procedures as needed Date Initiated: 12/03/2021 Target Resolution Date: 04/20/2022 Goal Status: Active Patient/caregiver will verbalize comfort level met Date Initiated: 12/03/2021 Target Resolution Date: 04/20/2022 Goal Status: Active TARICA, HARL ([id number]) [id number] Page 6 of 12 Interventions: Encourage patient to take pain medications as prescribed Provide education on pain management Reposition patient for comfort Treatment Activities: Administer pain control measures as ordered : 12/03/2021 Notes: Pressure Nursing Diagnoses: Knowledge deficit related to management of pressures ulcers Potential for impaired tissue integrity related  to pressure, friction, moisture, and shear Goals: Patient will remain free from development of additional pressure ulcers Date Initiated: 12/03/2021 Target Resolution Date: 04/20/2022 Goal Status: Active Interventions: Assess:  immobility, friction, shearing, incontinence upon admission and as needed Assess offloading mechanisms upon admission and as needed Provide education on pressure ulcers Treatment Activities: Patient referred for seating evaluation to ensure proper offloading : 12/03/2021 T ordered outside of clinic : 12/03/2021 est Notes: Electronic Signature(s) Signed: 04/04/2022 5:16:09 PM By: Rhae Hammock RN Entered By: Rhae Hammock on 04/01/2022 15:01:10 -------------------------------------------------------------------------------- Pain Assessment Details Patient Name: Date of Service: [name] RLO TTE 04/01/2022 1:30 PM Medical Record Number: [id number] Patient Account Number: [id number] Date of Birth/Sex: Treating RN: [birth year] (76 y.o. F) Primary Care Ruperto Kiernan: Fonnie Jarvis Other Clinician: Referring Koriana Stepien: Treating Michaelann Gunnoe/Extender: Dorann Ou in Treatment: 17 Active Problems Location of Pain Severity and Description of Pain Patient Has Paino Yes Site Locations Pain Location: Generalized Pain, Pain in Ulcers Rate the pain. Current Pain Level: 8 Sotelo, Shaneece ([id number]) [id number] Page 7 of 12 Pain Management and Medication Current Pain Management: Electronic Signature(s) Signed: 04/03/2022 12:00:46 PM By: Erenest Blank Entered By: Erenest Blank on 04/01/2022 14:29:21 -------------------------------------------------------------------------------- Patient/Caregiver Education Details Patient Name: Date of Service: [name] RLO TTE 12/11/2023andnbsp1:30 PM Medical Record Number: [id number] Patient Account Number: [id number] Date of Birth/Gender: Treating RN: 09-Jun-1945 (76 y.o. [name], [name] Primary Care Physician: Fonnie Jarvis Other Clinician: Referring Physician: Treating Physician/Extender: Dorann Ou in Treatment: 17 Education  Assessment Education Provided To: Patient Education Topics Provided Nutrition: Methods: Explain/Verbal Responses: Reinforcements needed, State content correctly Pain: Methods: Explain/Verbal Responses: Reinforcements needed, State content correctly Pressure: Methods: Explain/Verbal Responses: Reinforcements needed, State content correctly Electronic Signature(s) Signed: 04/04/2022 5:16:09 PM By: Rhae Hammock RN Entered By: Rhae Hammock on 04/01/2022 15:01:28 -------------------------------------------------------------------------------- Wound Assessment Details Patient Name: Date of Service: [name] RLO TTE 04/01/2022 1:30 PM Medical Record Number: [id number] Patient Account Number: [id number] Date of Birth/Sex: Treating RN: 18-Sep-1945 (76 y.o. [name] Primary Care Lawsen Arnott: Fonnie Jarvis Other Clinician: Referring Bridie Colquhoun: Treating Shirle Provencal/Extender: Aloha Gell Weeks in Treatment: 17 Wound Status Wound Number: 1 Primary Pressure Ulcer Etiology: Wound Location: Sacrum Wound Open Wounding Event: Pressure Injury Status: Date Acquired: 11/02/2021 Comorbid Cataracts, Chronic sinus problems/congestion, Middle ear Weeks Of Treatment: 17 History: problems, Anemia, Asthma, Osteoarthritis, FRANKEE, GRITZ ([id number]) [id number] Page 8 of 12 History: problems, Anemia, Asthma, Osteoarthritis, Quadriplegia Clustered Wound: Yes Photos Wound Measurements Length: (cm) Width: (cm) Depth: (cm) Clustered Quantity: Area: (cm) Volume: (cm) 1.5 % Reduction in Area: 96.3% 0.5 % Reduction in Volume: 96.2% 0.1 Epithelialization: Large (67-100%) 1 Tunneling: No 0.589 Undermining: No 0.059 Wound Description Classification: Category/Stage III Wound Margin: Distinct, outline attached Exudate Amount: Medium Exudate Type: Serosanguineous Exudate Color: red, brown Foul Odor After Cleansing:  No Slough/Fibrino Yes Wound Bed Granulation Amount: Large (67-100%) Exposed Structure Granulation Quality: Red Fascia Exposed: No Necrotic Amount: Small (1-33%) Fat Layer (Subcutaneous Tissue) Exposed: No Necrotic Quality: Adherent Slough Tendon Exposed: No Muscle Exposed: No Joint Exposed: No Bone Exposed: No Periwound Skin Texture Texture Color No Abnormalities Noted: No No Abnormalities Noted: No Callus: No Atrophie Blanche: No Crepitus: No Cyanosis: No Excoriation: No Ecchymosis: No Induration: No Erythema: No Rash: No Hemosiderin Staining: No Scarring: No Mottled: No Pallor: No Moisture Rubor: No No Abnormalities Noted: No Dry / Scaly: No Maceration: No Treatment Notes Wound #1 (Sacrum) Cleanser Soap and Water Discharge Instruction: May shower and wash wound with dial antibacterial  soap and water prior to dressing change. Wound Cleanser Discharge Instruction: Cleanse the wound with wound cleanser prior to applying a clean dressing using gauze sponges, not tissue or cotton balls. Peri-Wound Care Skin Prep Discharge Instruction: Use skin prep as directed Zinc Oxide Ointment 30g tube Discharge Instruction: Apply Zinc Oxide to periwound with each dressing change Topical Primary Dressing NOREEN, MACKINTOSH ([id number]) [id number] Page 9 of 12 Santyl Ointment Discharge Instruction: Apply nickel thick amount to wound bed as instructed Hydrofera Blue Ready Foam, 4x5 in Discharge Instruction: Apply to wound bed as instructed Secondary Dressing Zetuvit Plus Silicone Border Dressing 7x7(in/in) Discharge Instruction: Apply silicone border over primary dressing as directed. Secured With Compression Wrap Compression Stockings Environmental education officer) Signed: 04/01/2022 4:58:54 PM By: Deon Pilling RN, BSN Signed: 04/03/2022 12:00:46 PM By: Erenest Blank Entered By: Erenest Blank on 04/01/2022  14:38:55 -------------------------------------------------------------------------------- Wound Assessment Details Patient Name: Date of Service: [name] RLO TTE 04/01/2022 1:30 PM Medical Record Number: [id number] Patient Account Number: [id number] Date of Birth/Sex: Treating RN: 07-Dec-1945 (76 y.o. [name], Meta.Reding Primary Care Canaan Prue: Fonnie Jarvis Other Clinician: Referring Yorel Redder: Treating Chaselyn Nanney/Extender: Dorann Ou in Treatment: 17 Wound Status Wound Number: 4R Primary Pressure Ulcer Etiology: Wound Location: Left Calcaneus Wound Open Wounding Event: Blister Status: Date Acquired: 02/25/2022 Comorbid Cataracts, Chronic sinus problems/congestion, Middle ear Weeks Of Treatment: 4 History: problems, Anemia, Asthma, Osteoarthritis, Quadriplegia Clustered Wound: No Photos Wound Measurements Length: (cm) 4.5 Width: (cm) 3.5 Depth: (cm) 0.1 Area: (cm) 12.37 Volume: (cm) 1.237 % Reduction in Area: -80% % Reduction in Volume: -80.1% Epithelialization: None Tunneling: No Undermining: No Wound Description Classification: Category/Stage II Wound Margin: Distinct, outline attached Exudate Amount: Medium Exudate Type: Serosanguineous Exudate Color: red, brown Proud, Kaysi ([id number]) Wound Bed Granulation Amount: Large (67-100%) Granulation Quality: Red Necrotic Amount: Small (1-33%) Necrotic Quality: Adherent Slough Foul Odor After Cleansing: No Slough/Fibrino No [id number] Page 10 of 12 Exposed Structure Fascia Exposed: No Fat Layer (Subcutaneous Tissue) Exposed: Yes Tendon Exposed: No Muscle Exposed: No Joint Exposed: No Bone Exposed: No Periwound Skin Texture Texture Color No Abnormalities Noted: No No Abnormalities Noted: No Callus: No Atrophie Blanche: No Crepitus: No Cyanosis: No Excoriation: No Ecchymosis: No Induration: No Erythema: No Rash: No Hemosiderin Staining:  No Scarring: No Mottled: No Pallor: No Moisture Rubor: No No Abnormalities Noted: No Dry / Scaly: No Maceration: No Electronic Signature(s) Signed: 04/01/2022 4:58:54 PM By: Deon Pilling RN, BSN Signed: 04/03/2022 12:00:46 PM By: Erenest Blank Entered By: Erenest Blank on 04/01/2022 14:39:30 -------------------------------------------------------------------------------- Wound Assessment Details Patient Name: Date of Service: [name] RLO TTE 04/01/2022 1:30 PM Medical Record Number: [id number] Patient Account Number: [id number] Date of Birth/Sex: Treating RN: 11/13/1945 (76 y.o. [name], [name] Primary Care Kashlyn Salinas: Fonnie Jarvis Other Clinician: Referring Nissi Doffing: Treating Keylah Darwish/Extender: Dorann Ou in Treatment: 17 Wound Status Wound Number: 6 Primary Pressure Ulcer Etiology: Wound Location: Left Calcaneus Wound Open Wounding Event: Pressure Injury Status: Date Acquired: 04/01/2022 Comorbid Cataracts, Chronic sinus problems/congestion, Middle ear Weeks Of Treatment: 0 History: problems, Anemia, Asthma, Osteoarthritis, Quadriplegia Clustered Wound: No Wound Measurements Length: (cm) 3.5 Width: (cm) 2 Depth: (cm) 0.2 Area: (cm) 5.498 Volume: (cm) 1.1 % Reduction in Area: % Reduction in Volume: Epithelialization: None Tunneling: No Undermining: No Wound Description Classification: Category/Stage III Wound Margin: Distinct, outline attached Exudate Amount: Medium Exudate Type: Serosanguineous Exudate Color: red, brown Foul Odor After Cleansing: No Slough/Fibrino Yes Wound Bed Granulation Amount: Medium (34-66%) Exposed Structure Granulation Quality: Red, Pink Fascia  Exposed: No Necrotic Amount: Medium (34-66%) Fat Layer (Subcutaneous Tissue) Exposed: Yes Necrotic Quality: Adherent Slough Tendon Exposed: No Muscle Exposed: No NIKESHA, KWASNY ([id number]) [id number] Page 11 of  12 Joint Exposed: No Bone Exposed: No Periwound Skin Texture Texture Color No Abnormalities Noted: No No Abnormalities Noted: No Callus: No Atrophie Blanche: No Crepitus: No Cyanosis: No Excoriation: No Ecchymosis: No Induration: No Erythema: No Rash: No Hemosiderin Staining: No Scarring: No Mottled: No Pallor: No Moisture Rubor: No No Abnormalities Noted: No Dry / Scaly: No Temperature / Pain Maceration: No Temperature: No Abnormality Treatment Notes Wound #6 (Calcaneus) Wound Laterality: Left Cleanser Wound Cleanser Discharge Instruction: Cleanse the wound with wound cleanser prior to applying a clean dressing using gauze sponges, not tissue or cotton balls. Peri-Wound Care Topical Primary Dressing Hydrofera Blue Ready Transfer Foam, 2.5x2.5 (in/in) Discharge Instruction: Apply directly to wound bed as directed MediHoney Gel, tube 1.5 (oz) Discharge Instruction: Apply to wound bed as instructed Secondary Dressing Woven Gauze Sponge, Non-Sterile 4x4 in Discharge Instruction: Apply over primary dressing as directed. Zetuvit Plus Silicone Border Dressing 4x4 (in/in) Discharge Instruction: Apply silicone border over primary dressing as directed. Secured With Compression Wrap Compression Stockings Environmental education officer) Signed: 04/04/2022 5:16:09 PM By: Rhae Hammock RN Entered By: Rhae Hammock on 04/01/2022 14:54:45 -------------------------------------------------------------------------------- Vitals Details Patient Name: Date of Service: [name] RLO TTE 04/01/2022 1:30 PM Medical Record Number: [id number] Patient Account Number: [id number] Date of Birth/Sex: Treating RN: [birth year] (76 y.o. F) Primary Care Demitrius Crass: Fonnie Jarvis Other Clinician: Referring Enslee Bibbins: Treating Wataru Mccowen/Extender: Dorann Ou in Treatment: 17 Vital Signs Time Taken: 14:28 Temperature (F): 98.7 Height (in): 64 Pulse  (bpm): 75 Weight (lbs): 110 Respiratory Rate (breaths/min): 16 Whiters, [place] ([id number]) [id number].pdf Page 12 of 12 Body Mass Index (BMI): 18.9 Blood Pressure (mmHg): 126/86 Reference Range: 80 - 120 mg / dl Electronic Signature(s) Signed: 04/03/2022 12:00:46 PM By: Erenest Blank Entered By: Erenest Blank on 04/01/2022 14:29:06
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
## 2022-04-11 ENCOUNTER — Ambulatory Visit: Payer: PPO | Attending: Physician Assistant | Admitting: Physical Therapy

## 2022-04-11 ENCOUNTER — Telehealth: Payer: Self-pay | Admitting: Physical Therapy

## 2022-04-11 DIAGNOSIS — R2689 Other abnormalities of gait and mobility: Secondary | ICD-10-CM | POA: Insufficient documentation

## 2022-04-11 DIAGNOSIS — M6281 Muscle weakness (generalized): Secondary | ICD-10-CM | POA: Insufficient documentation

## 2022-04-11 NOTE — Therapy (Signed)
[facility] [address] [place], [place], [address] Phone: [phone]   Fax:  [phone]  Patient Details  Name: [name] MRN: [id number] Date of Birth: 1945/11/28 Referring Provider: Ladora Daniel, PA-C   Encounter Date: 04/11/2022  Pt presents w/caregiver, Westley Hummer, in power chair. Pt reports that she would like to work on her BUE strength, as she is no longer able to functionally use her LUE like she used to. Has not been able to move her arms well for months. At this time, pt deemed more appropriate for OT to work on BUE strength and will plan on obtaining new power chair next year, as pt is due for new one in 2024. Pt in agreement with plan.    Jill Alexanders Athziri Freundlich Guardiola, PT, DPT 04/11/2022, 2:01 PM  [facility] [address] [place], [place], [address] Phone: [phone]   Fax:  [phone]

## 2022-04-18 ENCOUNTER — Encounter (HOSPITAL_BASED_OUTPATIENT_CLINIC_OR_DEPARTMENT_OTHER): Payer: PPO | Admitting: Internal Medicine

## 2022-04-18 DIAGNOSIS — L89623 Pressure ulcer of left heel, stage 3: Secondary | ICD-10-CM | POA: Diagnosis not present

## 2022-04-18 DIAGNOSIS — L89153 Pressure ulcer of sacral region, stage 3: Secondary | ICD-10-CM | POA: Diagnosis not present
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
## 2022-04-19 NOTE — Progress Notes (Signed)
AMARIZ, FLAMENCO ([id number]) [id number] Page 1 of 9 Visit Report for 04/18/2022 Arrival Information Details Patient Name: Date of Service: NAYAB, ATEN RLO TTE 04/18/2022 11:00 A M Medical Record Number: [id number] Patient Account Number: [id number] Date of Birth/Sex: Treating RN: 1946/04/04 (76 y.o. [name], [name] Primary Care Shawnique Mariotti: Fonnie Jarvis Other Clinician: Referring Auriah Hollings: Treating Correen Bubolz/Extender: Dorann Ou in Treatment: 61 Visit Information History Since Last Visit Added or deleted any medications: No Patient Arrived: Wheel Chair Any new allergies or adverse reactions: No Arrival Time: 11:38 Had a fall or experienced change in No Accompanied By: caregiver activities of daily living that may affect Transfer Assistance: Harrel Lemon Lift risk of falls: Patient Identification Verified: Yes Signs or symptoms of abuse/neglect since last visito No Secondary Verification Process Completed: Yes Hospitalized since last visit: No Patient Requires Transmission-Based Precautions: No Implantable device outside of the clinic excluding No Patient Has Alerts: No cellular tissue based products placed in the center since last visit: Has Dressing in Place as Prescribed: Yes Pain Present Now: Yes Electronic Signature(s) Signed: 04/18/2022 6:05:29 PM By: Deon Pilling RN, BSN Entered By: Deon Pilling on 04/18/2022 11:41:39 -------------------------------------------------------------------------------- Encounter Discharge Information Details Patient Name: Date of Service: [name] RLO TTE 04/18/2022 11:00 [place] Record Number: [id number] Patient Account Number: [id number] Date of Birth/Sex: Treating RN: [birth year] (76 y.o. [name], [name] Primary Care Orson Rho: Fonnie Jarvis Other Clinician: Referring Timi Reeser: Treating Veto Macqueen/Extender: Dorann Ou in Treatment:  84 Encounter Discharge Information Items Post Procedure Vitals Discharge Condition: Stable Temperature (F): 98.7 Ambulatory Status: Wheelchair Pulse (bpm): 74 Discharge Destination: Home Respiratory Rate (breaths/min): 17 Transportation: Private Auto Blood Pressure (mmHg): 120/80 Accompanied By: aide Schedule Follow-up Appointment: Yes Clinical Summary of Care: Patient Declined Electronic Signature(s) Signed: 04/18/2022 4:52:17 PM By: Rhae Hammock RN Entered By: Rhae Hammock on 04/18/2022 12:13:27 Marlaine Hind ([id number]) [id number] Page 2 of 9 -------------------------------------------------------------------------------- Lower Extremity Assessment Details Patient Name: Date of Service: STORIE, HEFFERN RLO TTE 04/18/2022 11:00 A M Medical Record Number: [id number] Patient Account Number: [id number] Date of Birth/Sex: Treating RN: 23-Aug-1945 (76 y.o. [name] Primary Care Lania Zawistowski: Fonnie Jarvis Other Clinician: Referring Hassan Blackshire: Treating Reymond Maynez/Extender: Aloha Gell Weeks in Treatment: 19 Edema Assessment Assessed: Shirlyn Goltz: Yes] Patrice Paradise: No] Edema: [Left: Yes] [Right: Yes] Calf Left: Right: Point of Measurement: 30 cm From Medial Instep 29 cm Ankle Left: Right: Point of Measurement: 9 cm From Medial Instep 20 cm Electronic Signature(s) Signed: 04/18/2022 6:05:29 PM By: Deon Pilling RN, BSN Entered By: Deon Pilling on 04/18/2022 11:44:13 -------------------------------------------------------------------------------- Multi Wound Chart Details Patient Name: Date of Service: [name] RLO TTE 04/18/2022 11:00 A M Medical Record Number: [id number] Patient Account Number: [id number] Date of Birth/Sex: Treating RN: 06-01-1945 (76 y.o. F) Primary Care Latravion Graves: Fonnie Jarvis Other Clinician: Referring Clarice Zulauf: Treating Alianny Toelle/Extender: Dorann Ou in  Treatment: 19 Vital Signs Height(in): 64 Pulse(bpm): 75 Weight(lbs): 110 Blood Pressure(mmHg): 110/67 Body Mass Index(BMI): 18.9 Temperature(F): 99.4 Respiratory Rate(breaths/min): 20 [1:Photos:] [N/A:N/A] Sacrum Left Calcaneus N/A Wound Location: Pressure Injury Pressure Injury N/A Wounding Event: Pressure Ulcer Pressure Ulcer N/A Primary Etiology: Cataracts, Chronic sinus Cataracts, Chronic sinus N/A Comorbid History: problems/congestion, Middle ear problems/congestion, Middle ear problems, Anemia, Asthma, problems, Anemia, Asthma, Connelley, Wladyslawa ([id number]) [id number] Page 3 of 9 Osteoarthritis, Quadriplegia Osteoarthritis, Quadriplegia 11/02/2021 04/01/2022 N/A Date Acquired: 19 2 N/A Weeks of Treatment: Open Open N/A Wound Status: No No N/A Wound Recurrence: Yes No N/A Clustered Wound: 1 N/A N/A Clustered Quantity: 1.5x0.7x0.3 2.3x1.5x0.1  N/A Measurements L x W x D (cm) 0.825 2.71 N/A A (cm) : rea 0.247 0.271 N/A Volume (cm) : 94.70% 50.70% N/A % Reduction in A rea: 84.30% 75.40% N/A % Reduction in Volume: Category/Stage III Category/Stage III N/A Classification: Medium Medium N/A Exudate A mount: Serosanguineous Serosanguineous N/A Exudate Type: red, brown red, brown N/A Exudate Color: Distinct, outline attached Distinct, outline attached N/A Wound Margin: Large (67-100%) Large (67-100%) N/A Granulation A mount: Red Red, Pink N/A Granulation Quality: Small (1-33%) Small (1-33%) N/A Necrotic A mount: Adherent Slough Eschar, Adherent Slough N/A Necrotic Tissue: Fascia: No Fat Layer (Subcutaneous Tissue): Yes N/A Exposed Structures: Fat Layer (Subcutaneous Tissue): No Fascia: No Tendon: No Tendon: No Muscle: No Muscle: No Joint: No Joint: No Bone: No Bone: No Medium (34-66%) None N/A Epithelialization: N/A Debridement - Selective/Open Wound N/A Debridement: Pre-procedure Verification/Time Out N/A  12:10 N/A Taken: N/A Lidocaine N/A Pain Control: N/A Necrotic/Eschar, Slough N/A Tissue Debrided: N/A Non-Viable Tissue N/A Level: N/A 3.45 N/A Debridement A (sq cm): rea N/A Blade, Forceps N/A Instrument: N/A Minimum N/A Bleeding: N/A Pressure N/A Hemostasis A chieved: N/A 0 N/A Procedural Pain: N/A 0 N/A Post Procedural Pain: N/A Procedure was tolerated well N/A Debridement Treatment Response: N/A 2.3x1.5x0.1 N/A Post Debridement Measurements L x W x D (cm) N/A 0.271 N/A Post Debridement Volume: (cm) N/A Category/Stage III N/A Post Debridement Stage: Excoriation: No Excoriation: No N/A Periwound Skin Texture: Induration: No Induration: No Callus: No Callus: No Crepitus: No Crepitus: No Rash: No Rash: No Scarring: No Scarring: No Maceration: No Maceration: No N/A Periwound Skin Moisture: Dry/Scaly: No Dry/Scaly: No Atrophie Blanche: No Atrophie Blanche: No N/A Periwound Skin Color: Cyanosis: No Cyanosis: No Ecchymosis: No Ecchymosis: No Erythema: No Erythema: No Hemosiderin Staining: No Hemosiderin Staining: No Mottled: No Mottled: No Pallor: No Pallor: No Rubor: No Rubor: No N/A No Abnormality N/A Temperature: N/A Debridement N/A Procedures Performed: Treatment Notes Electronic Signature(s) Signed: 04/18/2022 12:16:15 PM By: Kalman Shan DO Entered By: Kalman Shan on 04/18/2022 12:12:06 -------------------------------------------------------------------------------- Multi-Disciplinary Care Plan Details Patient Name: Date of Service: [name] RLO TTE 04/18/2022 11:00 A M Medical Record Number: [id number] Patient Account Number: [id number] KATALEYAH, CARDUCCI ([id number]) [phone].pdf Page 4 of 9 Date of Birth/Sex: Treating RN: 14-Feb-1946 (76 y.o. [name], [name] Primary Care Ghazal Pevey: Fonnie Jarvis Other Clinician: Referring Tevis Dunavan: Treating Amora Sheehy/Extender: Dorann Ou in Treatment: 74 Active Inactive Nutrition Nursing Diagnoses: Potential for alteratiion in Nutrition/Potential for imbalanced nutrition Goals: Patient/caregiver agrees to and verbalizes understanding of need to obtain nutritional consultation Date Initiated: 12/03/2021 Target Resolution Date: 04/20/2022 Goal Status: Active Interventions: Assess patient nutrition upon admission and as needed per policy Provide education on nutrition Treatment Activities: Education provided on Nutrition : 04/01/2022 Patient referred to Primary Care Physician for further nutritional evaluation : 12/03/2021 Notes: Pain, Acute or Chronic Nursing Diagnoses: Pain, acute or chronic: actual or potential Potential alteration in comfort, pain Goals: Patient will verbalize adequate pain control and receive pain control interventions during procedures as needed Date Initiated: 12/03/2021 Target Resolution Date: 04/20/2022 Goal Status: Active Patient/caregiver will verbalize comfort level met Date Initiated: 12/03/2021 Target Resolution Date: 04/20/2022 Goal Status: Active Interventions: Encourage patient to take pain medications as prescribed Provide education on pain management Reposition patient for comfort Treatment Activities: Administer pain control measures as ordered : 12/03/2021 Notes: Pressure Nursing Diagnoses: Knowledge deficit related to management of pressures ulcers Potential for impaired tissue integrity related to pressure, friction, moisture, and shear Goals: Patient will remain free  from development of additional pressure ulcers Date Initiated: 12/03/2021 Target Resolution Date: 04/20/2022 Goal Status: Active Interventions: Assess: immobility, friction, shearing, incontinence upon admission and as needed Assess offloading mechanisms upon admission and as needed Provide education on pressure ulcers Treatment Activities: Patient referred for seating evaluation to ensure  proper offloading : 12/03/2021 T ordered outside of clinic : 12/03/2021 est Notes: Electronic Signature(s) Signed: 04/18/2022 4:52:17 PM By: Rhae Hammock RN Cheree Ditto, Glorianna ([id number]) By: Rhae Hammock RN [phone].pdf Page 5 of 9 Signed: 04/18/2022 4:52:17 PM Entered By: Rhae Hammock on 04/18/2022 12:12:12 -------------------------------------------------------------------------------- Pain Assessment Details Patient Name: Date of Service: MICHAELEEN, DOWN RLO TTE 04/18/2022 11:00 A M Medical Record Number: [id number] Patient Account Number: [id number] Date of Birth/Sex: Treating RN: 04/29/1945 (76 y.o. [name] Primary Care Erandi Lemma: Fonnie Jarvis Other Clinician: Referring Aleksis Jiggetts: Treating Gedalya Jim/Extender: Dorann Ou in Treatment: 19 Active Problems Location of Pain Severity and Description of Pain Patient Has Paino Yes Site Locations Pain Location: Generalized Pain Rate the pain. Current Pain Level: 7 Pain Management and Medication Current Pain Management: Medication: No Cold Application: No Rest: No Massage: No Activity: No T.E.N.S.: No Heat Application: No Leg drop or elevation: No Is the Current Pain Management Adequate: Adequate How does your wound impact your activities of daily livingo Sleep: No Bathing: No Appetite: No Relationship With Others: No Bladder Continence: No Emotions: No Bowel Continence: No Work: No Toileting: No Drive: No Dressing: No Hobbies: No Engineer, maintenance) Signed: 04/18/2022 6:05:29 PM By: Deon Pilling RN, BSN Entered By: Deon Pilling on 04/18/2022 11:42:05 -------------------------------------------------------------------------------- Patient/Caregiver Education Details Patient Name: Date of Service: [name] RLO TTE 12/28/2023andnbsp11:00 [place] Record Number: [id number] Patient Account Number: [id number] OLETHA, TOLSON  ([id number]) [phone].pdf Page 6 of 9 Date of Birth/Gender: Treating RN: 01-04-1946 (76 y.o. [name], [name] Primary Care Physician: Fonnie Jarvis Other Clinician: Referring Physician: Treating Physician/Extender: Dorann Ou in Treatment: 52 Education Assessment Education Provided To: Patient Education Topics Provided Wound/Skin Impairment: Methods: Explain/Verbal Responses: Reinforcements needed, State content correctly [employer]) Signed: 04/18/2022 4:52:17 PM By: Rhae Hammock RN Entered By: Rhae Hammock on 04/18/2022 12:12:29 -------------------------------------------------------------------------------- Wound Assessment Details Patient Name: Date of Service: [name] RLO TTE 04/18/2022 11:00 A M Medical Record Number: [id number] Patient Account Number: [id number] Date of Birth/Sex: Treating RN: 1945-05-05 (76 y.o. [name], Meta.Reding Primary Care Shakena Callari: Fonnie Jarvis Other Clinician: Referring Billiejo Sorto: Treating Lee Kalt/Extender: Dorann Ou in Treatment: 19 Wound Status Wound Number: 1 Primary Pressure Ulcer Etiology: Wound Location: Sacrum Wound Open Wounding Event: Pressure Injury Status: Date Acquired: 11/02/2021 Comorbid Cataracts, Chronic sinus problems/congestion, Middle ear Weeks Of Treatment: 19 History: problems, Anemia, Asthma, Osteoarthritis, Quadriplegia Clustered Wound: Yes Photos Wound Measurements Length: (cm) Width: (cm) Depth: (cm) Clustered Quantity: Area: (cm) Volume: (cm) 1.5 % Reduction in Area: 94.7% 0.7 % Reduction in Volume: 84.3% 0.3 Epithelialization: Medium (34-66%) 1 Tunneling: No 0.825 Undermining: No 0.247 Wound Description Classification: Category/Stage III Wound Margin: Distinct, outline attached Exudate Amount: Medium Exudate Type: Serosanguineous Mackie, Arthella ([id number]) Exudate Color: red,  brown Foul Odor After Cleansing: No Slough/Fibrino Yes [id number] Page 7 of 9 Wound Bed Granulation Amount: Large (67-100%) Exposed Structure Granulation Quality: Red Fascia Exposed: No Necrotic Amount: Small (1-33%) Fat Layer (Subcutaneous Tissue) Exposed: No Necrotic Quality: Adherent Slough Tendon Exposed: No Muscle Exposed: No Joint Exposed: No Bone Exposed: No Periwound Skin Texture Texture Color No Abnormalities Noted: No No Abnormalities Noted: No Callus: No Atrophie Blanche: No Crepitus: No Cyanosis:  No Excoriation: No Ecchymosis: No Induration: No Erythema: No Rash: No Hemosiderin Staining: No Scarring: No Mottled: No Pallor: No Moisture Rubor: No No Abnormalities Noted: No Dry / Scaly: No Maceration: No Treatment Notes Wound #1 (Sacrum) Cleanser Soap and Water Discharge Instruction: May shower and wash wound with dial antibacterial soap and water prior to dressing change. Wound Cleanser Discharge Instruction: Cleanse the wound with wound cleanser prior to applying a clean dressing using gauze sponges, not tissue or cotton balls. Peri-Wound Care Skin Prep Discharge Instruction: Use skin prep as directed Zinc Oxide Ointment 30g tube Discharge Instruction: Apply Zinc Oxide to periwound with each dressing change Topical Primary Dressing Santyl Ointment Discharge Instruction: Apply nickel thick amount to wound bed as instructed Hydrofera Blue Ready Foam, 4x5 in Discharge Instruction: Apply to wound bed as instructed Secondary Dressing Zetuvit Plus Silicone Border Dressing 7x7(in/in) Discharge Instruction: Apply silicone border over primary dressing as directed. Secured With Compression Wrap Compression Stockings Environmental education officer) Signed: 04/18/2022 6:05:29 PM By: Deon Pilling RN, BSN Entered By: Deon Pilling on 04/18/2022 11:56:05 Marlaine Hind ([id number]) [id number] Page 8  of 9 -------------------------------------------------------------------------------- Wound Assessment Details Patient Name: Date of Service: JAINA, MORIN RLO TTE 04/18/2022 11:00 A M Medical Record Number: [id number] Patient Account Number: [id number] Date of Birth/Sex: Treating RN: 08-15-1945 (76 y.o. [name], Meta.Reding Primary Care Vernisha Bacote: Fonnie Jarvis Other Clinician: Referring Daxter Paule: Treating Damin Salido/Extender: Dorann Ou in Treatment: 19 Wound Status Wound Number: 6 Primary Pressure Ulcer Etiology: Wound Location: Left Calcaneus Wound Open Wounding Event: Pressure Injury Status: Date Acquired: 04/01/2022 Comorbid Cataracts, Chronic sinus problems/congestion, Middle ear Weeks Of Treatment: 2 History: problems, Anemia, Asthma, Osteoarthritis, Quadriplegia Clustered Wound: No Photos Wound Measurements Length: (cm) 2.3 Width: (cm) 1.5 Depth: (cm) 0.1 Area: (cm) 2.71 Volume: (cm) 0.271 % Reduction in Area: 50.7% % Reduction in Volume: 75.4% Epithelialization: None Wound Description Classification: Category/Stage III Wound Margin: Distinct, outline attached Exudate Amount: Medium Exudate Type: Serosanguineous Exudate Color: red, brown Foul Odor After Cleansing: No Slough/Fibrino Yes Wound Bed Granulation Amount: Large (67-100%) Exposed Structure Granulation Quality: Red, Pink Fascia Exposed: No Necrotic Amount: Small (1-33%) Fat Layer (Subcutaneous Tissue) Exposed: Yes Necrotic Quality: Eschar, Adherent Slough Tendon Exposed: No Muscle Exposed: No Joint Exposed: No Bone Exposed: No Periwound Skin Texture Texture Color No Abnormalities Noted: No No Abnormalities Noted: No Callus: No Atrophie Blanche: No Crepitus: No Cyanosis: No Excoriation: No Ecchymosis: No Induration: No Erythema: No Rash: No Hemosiderin Staining: No Scarring: No Mottled: No Pallor: No Moisture Rubor: No No Abnormalities Noted: No Dry /  Scaly: No Temperature / Pain Maceration: No Temperature: No Abnormality Treatment Notes Wound #6 (Calcaneus) Wound Laterality: Left Kaufman, [place] ([id number]) [id number] Page 9 of 9 Wound Cleanser Discharge Instruction: Cleanse the wound with wound cleanser prior to applying a clean dressing using gauze sponges, not tissue or cotton balls. Peri-Wound Care Topical Primary Dressing Hydrofera Blue Ready Transfer Foam, 2.5x2.5 (in/in) Discharge Instruction: Apply directly to wound bed as directed MediHoney Gel, tube 1.5 (oz) Discharge Instruction: Apply to wound bed as instructed Secondary Dressing Woven Gauze Sponge, Non-Sterile 4x4 in Discharge Instruction: Apply over primary dressing as directed. Zetuvit Plus Silicone Border Dressing 4x4 (in/in) Discharge Instruction: Apply silicone border over primary dressing as directed. Secured With Compression Wrap Compression Stockings Environmental education officer) Signed: 04/18/2022 6:05:29 PM By: Deon Pilling RN, BSN Entered By: Deon Pilling on 04/18/2022 11:56:29 -------------------------------------------------------------------------------- Vitals Details Patient Name: Date of Service: [name] RLO TTE 04/18/2022 11:00 A M Medical  Record Number: [id number] Patient Account Number: [id number] Date of Birth/Sex: Treating RN: [birth year] (76 y.o. [name], Meta.Reding Primary Care Uziah Sorter: Fonnie Jarvis Other Clinician: Referring Fernando Stoiber: Treating Camilla Skeen/Extender: Dorann Ou in Treatment: 19 Vital Signs Time Taken: 11:40 Temperature (F): 99.4 Height (in): 64 Pulse (bpm): 75 Weight (lbs): 110 Respiratory Rate (breaths/min): 20 Body Mass Index (BMI): 18.9 Blood Pressure (mmHg): 110/67 Reference Range: 80 - 120 mg / dl Electronic Signature(s) Signed: 04/18/2022 6:05:29 PM By: Deon Pilling RN, BSN Entered By: Deon Pilling on 04/18/2022 11:41:57

## 2022-04-25 ENCOUNTER — Telehealth: Payer: Self-pay | Admitting: Physical Therapy

## 2022-04-25 DIAGNOSIS — M6281 Muscle weakness (generalized): Secondary | ICD-10-CM

## 2022-04-25 DIAGNOSIS — G8250 Quadriplegia, unspecified: Secondary | ICD-10-CM

## 2022-04-25 NOTE — Telephone Encounter (Signed)
Order placed, Thank you.

## 2022-04-25 NOTE — Progress Notes (Signed)
Erin, [name] ([id number]) [id number] Page 1 of 8 Visit Report for 02/18/2022 Arrival Information Details Patient Name: Date of Service: [name], [name] 02/18/2022 2:15 PM Medical Record Number: [id number] Patient Account Number: [id number] Date of Birth/Sex: Treating RN: [birth year] (77 y.o. [name], [name] Primary Care Megen Madewell: Fonnie Jarvis Other Clinician: Referring Sherle Mello: Treating Radley Barto/Extender: Dorann Ou in Treatment: 11 Visit Information History Since Last Visit Added or deleted any medications: No Patient Arrived: Wheel Chair Any new allergies or adverse reactions: No Arrival Time: 14:15 Had a fall or experienced change in No Accompanied By: caregiver activities of daily living that may affect Transfer Assistance: Harrel Lemon Lift risk of falls: Patient Identification Verified: Yes Signs or symptoms of abuse/neglect since last visito No Secondary Verification Process Completed: Yes Hospitalized since last visit: No Patient Requires Transmission-Based Precautions: No Implantable device outside of the clinic excluding No Patient Has Alerts: No cellular tissue based products placed in the center since last visit: Has Dressing in Place as Prescribed: Yes Pain Present Now: No Electronic Signature(s) Signed: 02/18/2022 4:53:44 PM By: Deon Pilling RN, BSN Entered By: Deon Pilling on 02/18/2022 14:20:26 -------------------------------------------------------------------------------- Clinic Level of Care Assessment Details Patient Name: Date of Service: [name], [name] 02/18/2022 2:15 PM Medical Record Number: [id number] Patient Account Number: [id number] Date of Birth/Sex: Treating RN: 1945-07-13 (77 y.o. [name], [name] Primary Care Reese Stockman: Fonnie Jarvis Other Clinician: Referring Georgette Helmer: Treating Lynae Pederson/Extender: Dorann Ou in Treatment: 11 Clinic  Level of Care Assessment Items TOOL 4 Quantity Score X- 1 0 Use when only an EandM is performed on FOLLOW-UP visit ASSESSMENTS - Nursing Assessment / Reassessment X- 1 10 Reassessment of Co-morbidities (includes updates in patient status) X- 1 5 Reassessment of Adherence to Treatment Plan ASSESSMENTS - Wound and Skin A ssessment / Reassessment X - Simple Wound Assessment / Reassessment - one wound 1 5 _0  - 0 Complex Wound Assessment / Reassessment - multiple wounds _1  - 0 Dermatologic / Skin Assessment (not related to wound area) ASSESSMENTS - Focused Assessment _2  - 0 Circumferential Edema Measurements - multi extremities _3  - 0 Nutritional Assessment / Counseling / Intervention SRESHTA, CRESSLER ([id number]) [id number] Page 2 of 8 _4  - 0 Lower Extremity Assessment (monofilament, tuning fork, pulses) _5  - 0 Peripheral Arterial Disease Assessment (using hand held doppler) ASSESSMENTS - Ostomy and/or Continence Assessment and Care _6  - 0 Incontinence Assessment and Management _7  - 0 Ostomy Care Assessment and Management (repouching, etc.) PROCESS - Coordination of Care X - Simple Patient / Family Education for ongoing care 1 15 _8  - 0 Complex (extensive) Patient / Family Education for ongoing care X- 1 10 Staff obtains Programmer, systems, Records, T Results / Process Orders est _9  - 0 Staff telephones HHA, Nursing Homes / Clarify orders / etc _10  - 0 Routine Transfer to another Facility (non-emergent condition) _11  - 0 Routine Hospital Admission (non-emergent condition) _12  - 0 New Admissions / Biomedical engineer / Ordering NPWT Apligraf, etc. , _13  - 0 Emergency Hospital Admission (emergent condition) X- 1 10 Simple Discharge Coordination _14  - 0 Complex (extensive) Discharge Coordination PROCESS - Special Needs _15  - 0 Pediatric / Minor Patient Management _16  - 0 Isolation Patient Management _17  - 0 Hearing / Language / Visual special  needs _18  - 0 Assessment of Community assistance (transportation, D/C planning, etc.) _19  - 0 Additional assistance / Altered mentation _20  - 0 Support Surface(s) Assessment (bed, cushion, seat, etc.) INTERVENTIONS - Wound Cleansing / Measurement X - Simple Wound Cleansing -  one wound 1 5 _0  - 0 Complex Wound Cleansing - multiple wounds X- 1 5 Wound Imaging (photographs - any number of wounds) _1  - 0 Wound Tracing (instead of photographs) X- 1 5 Simple Wound Measurement - one wound _2  - 0 Complex Wound Measurement - multiple wounds INTERVENTIONS - Wound Dressings X - Small Wound Dressing one or multiple wounds 1 10 _3  - 0 Medium Wound Dressing one or multiple wounds _4  - 0 Large Wound Dressing one or multiple wounds X- 1 5 Application of Medications - topical <SAYTKZSWFUXNATFT>_7<\/DUKGURKYHCWCBJSE>_8  - 0 Application of Medications - injection INTERVENTIONS - Miscellaneous _6  - 0 External ear exam _7  - 0 Specimen Collection (cultures, biopsies, blood, body fluids, etc.) _8  - 0 Specimen(s) / Culture(s) sent or taken to Lab for analysis X- 1 10 Patient Transfer (multiple staff / Civil Service fast streamer / Similar devices) _9  - 0 Simple Staple / Suture removal (25 or less) _10  - 0 Complex Staple / Suture removal (26 or more) _11  - 0 Hypo / Hyperglycemic Management (close monitor of Blood Glucose) Hitzeman, Madilyn ([id number]) [id number] Page 3 of 8 _12  - 0 Ankle / Brachial Index (ABI) - do not check if billed separately X- 1 5 Vital Signs Has the patient been seen at the hospital within the last three years: Yes Total Score: 100 Level Of Care: New/Established - Level 3 Electronic Signature(s) Signed: 04/24/2022 5:36:43 PM By: Rhae Hammock RN Entered By: Rhae Hammock on 02/18/2022 14:46:13 -------------------------------------------------------------------------------- Encounter Discharge Information Details Patient Name: Date of Service: [name] 02/18/2022 2:15 PM Medical  Record Number: [id number] Patient Account Number: [id number] Date of Birth/Sex: Treating RN: [birth year] (76 y.o. [name], [name] Primary Care Eliga Arvie: Fonnie Jarvis Other Clinician: Referring Leiya Keesey: Treating Farren Nelles/Extender: Dorann Ou in Treatment: 11 Encounter Discharge Information Items Discharge Condition: Stable Ambulatory Status: Wheelchair Discharge Destination: Home Transportation: Private Auto Accompanied By: aide Schedule Follow-up Appointment: Yes Clinical Summary of Care: Patient Declined Electronic Signature(s) Signed: 04/24/2022 5:36:43 PM By: Rhae Hammock RN Entered By: Rhae Hammock on 02/18/2022 14:46:47 -------------------------------------------------------------------------------- Lower Extremity Assessment Details Patient Name: Date of Service: [name] 02/18/2022 2:15 PM Medical Record Number: [id number] Patient Account Number: [id number] Date of Birth/Sex: Treating RN: [birth year] (77 y.o. [name] Primary Care Ruther Ephraim: Fonnie Jarvis Other Clinician: Referring Jaquana Geiger: Treating Winnie Barsky/Extender: Dorann Ou in Treatment: 11 Electronic Signature(s) Signed: 02/18/2022 4:53:44 PM By: Deon Pilling RN, BSN Entered By: Deon Pilling on 02/18/2022 14:20:47 -------------------------------------------------------------------------------- Multi Wound Chart Details Patient Name: Date of Service: [name] 02/18/2022 2:15 PM Medical Record Number: [id number] Patient Account Number: [id number] [name], [name] ([id number]) [id number] Page 4 of 8 Date of Birth/Sex: Treating RN: Apr 26, 1945 (77 y.o. F) Primary Care Karolyne Timmons: Fonnie Jarvis Other Clinician: Referring Lamone Ferrelli: Treating Danylle Ouk/Extender: Dorann Ou in Treatment: 11 [1:Photos:] [Erin/A:Erin/A] Sacrum Erin/A Erin/A Wound  Location: Pressure Injury Erin/A Erin/A Wounding Event: Pressure Ulcer Erin/A Erin/A Primary Etiology: Cataracts, Chronic sinus Erin/A Erin/A Comorbid History: problems/congestion, Middle ear problems, Anemia, Asthma, Osteoarthritis, Quadriplegia 11/02/2021 Erin/A Erin/A Date Acquired: 11 Erin/A Erin/A Weeks of Treatment: Open Erin/A Erin/A Wound Status: No Erin/A Erin/A Wound Recurrence: Yes Erin/A Erin/A Clustered Wound: 1 Erin/A Erin/A Clustered Quantity: 0.1x0.1x0.1 Erin/A Erin/A Measurements L x W x D (cm) 0.008 Erin/A Erin/A A (cm) : rea 0.001 Erin/A Erin/A Volume (cm) : 99.90% Erin/A Erin/A % Reduction in A rea: 99.90% Erin/A Erin/A % Reduction in Volume: Category/Stage III Erin/A Erin/A Classification: None Present Erin/A Erin/A Exudate  A mount: Distinct, outline attached Erin/A Erin/A Wound Margin: None Present (0%) Erin/A Erin/A Granulation A mount: None Present (0%) Erin/A Erin/A Necrotic A mount: Fascia: No Erin/A Erin/A Exposed Structures: Fat Layer (Subcutaneous Tissue): No Tendon: No Muscle: No Joint: No Bone: No Large (67-100%) Erin/A Erin/A Epithelialization: Excoriation: No Erin/A Erin/A Periwound Skin Texture: Induration: No Callus: No Crepitus: No Rash: No Scarring: No Maceration: No Erin/A Erin/A Periwound Skin Moisture: Dry/Scaly: No Atrophie Blanche: No Erin/A Erin/A Periwound Skin Color: Cyanosis: No Ecchymosis: No Erythema: No Hemosiderin Staining: No Mottled: No Pallor: No Rubor: No Treatment Notes Electronic Signature(s) Signed: 02/22/2022 1:40:10 PM By: Kalman Shan DO Entered By: Kalman Shan on 02/18/2022 14:44:34 -------------------------------------------------------------------------------- Multi-Disciplinary Care Plan Details Patient Name: Date of Service: [name] 02/18/2022 2:15 PM Marlaine Hind ([id number]) [id number] Page 5 of 8 Medical Record Number: [id number] Patient Account Number: [id number] Date of Birth/Sex: Treating RN: 05-15-1945 (77 y.o. [name], [name] Primary Care Catrina Fellenz:  Fonnie Jarvis Other Clinician: Referring Shardai Star: Treating Nelson Noone/Extender: Dorann Ou in Treatment: 11 Active Inactive Nutrition Nursing Diagnoses: Potential for alteratiion in Nutrition/Potential for imbalanced nutrition Goals: Patient/caregiver agrees to and verbalizes understanding of need to obtain nutritional consultation Date Initiated: 12/03/2021 Target Resolution Date: 02/15/2022 Goal Status: Active Interventions: Assess patient nutrition upon admission and as needed per policy Provide education on nutrition Treatment Activities: Education provided on Nutrition : 12/03/2021 Patient referred to Primary Care Physician for further nutritional evaluation : 12/03/2021 Notes: Pain, Acute or Chronic Nursing Diagnoses: Pain, acute or chronic: actual or potential Potential alteration in comfort, pain Goals: Patient will verbalize adequate pain control and receive pain control interventions during procedures as needed Date Initiated: 12/03/2021 Target Resolution Date: 02/15/2022 Goal Status: Active Patient/caregiver will verbalize comfort level met Date Initiated: 12/03/2021 Target Resolution Date: 02/15/2022 Goal Status: Active Interventions: Encourage patient to take pain medications as prescribed Provide education on pain management Reposition patient for comfort Treatment Activities: Administer pain control measures as ordered : 12/03/2021 Notes: Pressure Nursing Diagnoses: Knowledge deficit related to management of pressures ulcers Potential for impaired tissue integrity related to pressure, friction, moisture, and shear Goals: Patient will remain free from development of additional pressure ulcers Date Initiated: 12/03/2021 Target Resolution Date: 02/15/2022 Goal Status: Active Interventions: Assess: immobility, friction, shearing, incontinence upon admission and as needed Assess offloading mechanisms upon admission and as  needed Provide education on pressure ulcers Treatment Activities: Patient referred for seating evaluation to ensure proper offloading : 12/03/2021 T ordered outside of clinic : 12/03/2021 est Notes: Electronic Signature(s) LINETTA, REGNER ([id number]) [id number] Page 6 of 8 Signed: 04/24/2022 5:36:43 PM By: Rhae Hammock RN Entered By: Rhae Hammock on 02/18/2022 14:43:24 -------------------------------------------------------------------------------- Pain Assessment Details Patient Name: Date of Service: [name] 02/18/2022 2:15 PM Medical Record Number: [id number] Patient Account Number: [id number] Date of Birth/Sex: Treating RN: 06-08-1945 (77 y.o. [name] Primary Care Amaka Gluth: Fonnie Jarvis Other Clinician: Referring Melaine Mcphee: Treating Heiress Williamson/Extender: Dorann Ou in Treatment: 11 Active Problems Location of Pain Severity and Description of Pain Patient Has Paino No Site Locations Rate the pain. Current Pain Level: 0 Pain Management and Medication Current Pain Management: Medication: No Cold Application: No Rest: No Massage: No Activity: No T.E.Erin.S.: No Heat Application: No Leg drop or elevation: No Is the Current Pain Management Adequate: Adequate How does your wound impact your activities of daily livingo Sleep: No Bathing: No Appetite: No Relationship With Others: No Bladder Continence: No Emotions: No Bowel Continence: No Work: No  Toileting: No Drive: No Dressing: No Hobbies: No Electronic Signature(s) Signed: 02/18/2022 4:53:44 PM By: Deon Pilling RN, BSN Entered By: Deon Pilling on 02/18/2022 14:20:40 -------------------------------------------------------------------------------- Patient/Caregiver Education Details Patient Name: Date of Service: [name] 10/30/2023andnbsp2:15 PM Marlaine Hind ([id number]) [id number]  Page 7 of 8 Medical Record Number: [id number] Patient Account Number: [id number] Date of Birth/Gender: Treating RN: [birth year] (77 y.o. [name], [name] Primary Care Physician: Fonnie Jarvis Other Clinician: Referring Physician: Treating Physician/Extender: Dorann Ou in Treatment: 11 Education Assessment Education Provided To: Patient Education Topics Provided Nutrition: Methods: Explain/Verbal Responses: State content correctly Electronic Signature(s) Signed: 04/24/2022 5:36:43 PM By: Rhae Hammock RN Entered By: Rhae Hammock on 02/18/2022 14:45:00 -------------------------------------------------------------------------------- Wound Assessment Details Patient Name: Date of Service: [name] 02/18/2022 2:15 PM Medical Record Number: [id number] Patient Account Number: [id number] Date of Birth/Sex: Treating RN: [birth year] (77 y.o. [name], Meta.Reding Primary Care Jamarian Jacinto: Fonnie Jarvis Other Clinician: Referring Jamyah Folk: Treating Eliav Mechling/Extender: Dorann Ou in Treatment: 11 Wound Status Wound Number: 1 Primary Pressure Ulcer Etiology: Wound Location: Sacrum Wound Open Wounding Event: Pressure Injury Status: Date Acquired: 11/02/2021 Comorbid Cataracts, Chronic sinus problems/congestion, Middle ear Weeks Of Treatment: 11 History: problems, Anemia, Asthma, Osteoarthritis, Quadriplegia Clustered Wound: Yes Photos Wound Measurements Length: (cm) Width: (cm) Depth: (cm) Clustered Quantity: Area: (cm) Volume: (cm) 0.1 % Reduction in Area: 99.9% 0.1 % Reduction in Volume: 99.9% 0.1 Epithelialization: Large (67-100%) 1 Tunneling: No 0.008 Undermining: No 0.001 Wound Description Classification: Category/Stage III Wound Margin: Distinct, outline attached Exudate Amount: None Present Siria, Calandro Zalika ([id number]) Foul Odor After Cleansing: No Slough/Fibrino  Yes [id number] Page 8 of 8 Wound Bed Granulation Amount: None Present (0%) Exposed Structure Necrotic Amount: None Present (0%) Fascia Exposed: No Fat Layer (Subcutaneous Tissue) Exposed: No Tendon Exposed: No Muscle Exposed: No Joint Exposed: No Bone Exposed: No Periwound Skin Texture Texture Color No Abnormalities Noted: No No Abnormalities Noted: No Callus: No Atrophie Blanche: No Crepitus: No Cyanosis: No Excoriation: No Ecchymosis: No Induration: No Erythema: No Rash: No Hemosiderin Staining: No Scarring: No Mottled: No Pallor: No Moisture Rubor: No No Abnormalities Noted: No Dry / Scaly: No Maceration: No Electronic Signature(s) Signed: 02/18/2022 4:53:44 PM By: Deon Pilling RN, BSN Entered By: Deon Pilling on 02/18/2022 14:29:45 -------------------------------------------------------------------------------- [place] Details Patient Name: Date of Service: [name] 02/18/2022 2:15 PM Medical Record Number: [id number] Patient Account Number: [id number] Date of Birth/Sex: Treating RN: 21-Nov-1945 (77 y.o. [name], Meta.Reding Primary Care Sherolyn Trettin: Fonnie Jarvis Other Clinician: Referring Doyce Saling: Treating Debby Clyne/Extender: Dorann Ou in Treatment: 11 Vital Signs Time Taken: 14:20 Temperature (F): 97.9 Height (in): 64 Respiratory Rate (breaths/min): 16 Weight (lbs): 110 Reference Range: 80 - 120 mg / dl Body Mass Index (BMI): 18.9 Electronic Signature(s) Signed: 02/18/2022 4:53:44 PM By: Deon Pilling RN, BSN Entered By: Deon Pilling on 02/18/2022 14:54:58

## 2022-04-25 NOTE — Telephone Encounter (Signed)
Dr. 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    Dr. Krista Blue is scheduled to see her on 05/01/22 for Xeomin injection.   If you agree, please place an order in [facility] workque in [facility] or fax the order to [phone].  Thank you, Francena Hanly, PT, [place] [address] [place], [place]  [address] Phone:  [phone] Fax:  [phone]

## 2022-05-01 ENCOUNTER — Ambulatory Visit (INDEPENDENT_AMBULATORY_CARE_PROVIDER_SITE_OTHER): Payer: PPO | Admitting: Neurology

## 2022-05-01 ENCOUNTER — Ambulatory Visit: Payer: PPO | Admitting: Occupational Therapy

## 2022-05-01 ENCOUNTER — Encounter: Payer: Self-pay | Admitting: Neurology

## 2022-05-01 DIAGNOSIS — G8250 Quadriplegia, unspecified: Secondary | ICD-10-CM

## 2022-05-01 MED ORDER — INCOBOTULINUMTOXINA 100 UNITS IM SOLR
100.0000 [IU] | INTRAMUSCULAR | Status: DC
  Administered 2022-05-01: 100 [IU] via INTRAMUSCULAR

## 2022-05-01 MED ORDER — INCOBOTULINUMTOXINA 100 UNITS IM SOLR: 100.0000 [IU] | INTRAMUSCULAR | Status: DC

## 2022-05-01 NOTE — Telephone Encounter (Signed)
Notation added to Neurology comment in patient's chart.

## 2022-05-01 NOTE — Progress Notes (Signed)
Xeomin 100 units x 1 vials  [id number] Exp-03-2024 B/B

## 2022-05-01 NOTE — Telephone Encounter (Signed)
Please make notation for next injection in April 2024,  Please do not dissolve xeomin,  need to talk with patient first,
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
## 2022-05-02 ENCOUNTER — Encounter (HOSPITAL_BASED_OUTPATIENT_CLINIC_OR_DEPARTMENT_OTHER): Payer: PPO | Attending: General Surgery | Admitting: Internal Medicine

## 2022-05-02 DIAGNOSIS — L89153 Pressure ulcer of sacral region, stage 3: Secondary | ICD-10-CM | POA: Insufficient documentation

## 2022-05-02 DIAGNOSIS — Z950 Presence of cardiac pacemaker: Secondary | ICD-10-CM | POA: Insufficient documentation

## 2022-05-02 DIAGNOSIS — L89623 Pressure ulcer of left heel, stage 3: Secondary | ICD-10-CM | POA: Insufficient documentation

## 2022-05-02 DIAGNOSIS — E039 Hypothyroidism, unspecified: Secondary | ICD-10-CM | POA: Insufficient documentation

## 2022-05-02 DIAGNOSIS — I442 Atrioventricular block, complete: Secondary | ICD-10-CM | POA: Insufficient documentation

## 2022-05-02 DIAGNOSIS — G8250 Quadriplegia, unspecified: Secondary | ICD-10-CM | POA: Insufficient documentation

## 2022-05-02 DIAGNOSIS — G35 Multiple sclerosis: Secondary | ICD-10-CM | POA: Insufficient documentation

## 2022-05-02 DIAGNOSIS — N3289 Other specified disorders of bladder: Secondary | ICD-10-CM | POA: Insufficient documentation

## 2022-05-07 DIAGNOSIS — N3941 Urge incontinence: Secondary | ICD-10-CM | POA: Insufficient documentation

## 2022-05-09 ENCOUNTER — Encounter (HOSPITAL_BASED_OUTPATIENT_CLINIC_OR_DEPARTMENT_OTHER): Payer: PPO | Admitting: Internal Medicine

## 2022-05-09 DIAGNOSIS — G35 Multiple sclerosis: Secondary | ICD-10-CM

## 2022-05-09 DIAGNOSIS — G8250 Quadriplegia, unspecified: Secondary | ICD-10-CM

## 2022-05-09 DIAGNOSIS — Z950 Presence of cardiac pacemaker: Secondary | ICD-10-CM | POA: Diagnosis not present

## 2022-05-09 DIAGNOSIS — I442 Atrioventricular block, complete: Secondary | ICD-10-CM | POA: Diagnosis not present

## 2022-05-09 DIAGNOSIS — L89153 Pressure ulcer of sacral region, stage 3: Secondary | ICD-10-CM | POA: Diagnosis present

## 2022-05-09 DIAGNOSIS — E039 Hypothyroidism, unspecified: Secondary | ICD-10-CM | POA: Diagnosis not present

## 2022-05-09 DIAGNOSIS — L89623 Pressure ulcer of left heel, stage 3: Secondary | ICD-10-CM | POA: Diagnosis not present
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
## 2022-05-13 ENCOUNTER — Ambulatory Visit: Payer: PPO | Attending: Physician Assistant | Admitting: Occupational Therapy

## 2022-05-13 ENCOUNTER — Encounter: Payer: Self-pay | Admitting: Occupational Therapy

## 2022-05-13 DIAGNOSIS — M25622 Stiffness of left elbow, not elsewhere classified: Secondary | ICD-10-CM | POA: Diagnosis present

## 2022-05-13 DIAGNOSIS — M25611 Stiffness of right shoulder, not elsewhere classified: Secondary | ICD-10-CM | POA: Diagnosis present

## 2022-05-13 DIAGNOSIS — G8252 Quadriplegia, C1-C4 incomplete: Secondary | ICD-10-CM | POA: Diagnosis present

## 2022-05-13 DIAGNOSIS — R5381 Other malaise: Secondary | ICD-10-CM | POA: Insufficient documentation

## 2022-05-13 DIAGNOSIS — G8250 Quadriplegia, unspecified: Secondary | ICD-10-CM | POA: Diagnosis present

## 2022-05-13 DIAGNOSIS — R278 Other lack of coordination: Secondary | ICD-10-CM | POA: Insufficient documentation

## 2022-05-13 DIAGNOSIS — M6281 Muscle weakness (generalized): Secondary | ICD-10-CM | POA: Diagnosis not present

## 2022-05-13 DIAGNOSIS — M25642 Stiffness of left hand, not elsewhere classified: Secondary | ICD-10-CM | POA: Insufficient documentation

## 2022-05-13 DIAGNOSIS — M25621 Stiffness of right elbow, not elsewhere classified: Secondary | ICD-10-CM | POA: Insufficient documentation

## 2022-05-13 DIAGNOSIS — M25641 Stiffness of right hand, not elsewhere classified: Secondary | ICD-10-CM

## 2022-05-13 DIAGNOSIS — M25612 Stiffness of left shoulder, not elsewhere classified: Secondary | ICD-10-CM | POA: Diagnosis present

## 2022-05-15 DIAGNOSIS — M818 Other osteoporosis without current pathological fracture: Secondary | ICD-10-CM | POA: Insufficient documentation

## 2022-05-15 NOTE — Progress Notes (Signed)
[name], [name] ([id number]) [id number] Page 1 of 11 Visit Report for 05/09/2022 Arrival Information Details Patient Name: Date of Service: [name], [name] RLO TTE 05/09/2022 10:15 A M Medical Record Number: [id number] Patient Account Number: [id number] Date of Birth/Sex: Treating RN: 06-Oct-1945 (77 y.o. F) Primary Care [name]: [name] Other Clinician: Referring [name]: Treating [name]: [name]: 22 Visit Information History Since Last Visit Added or deleted any medications: No Patient Arrived: Wheel Chair Any new allergies or adverse reactions: No Arrival Time: 11:19 Had a fall or experienced change in No Accompanied By: caregiver activities of daily living that may affect Transfer Assistance: [name] Lift risk of falls: Patient Identification Verified: Yes Signs or symptoms of abuse/neglect since last visito No Secondary Verification Process Completed: Yes Hospitalized since last visit: No Patient Requires Transmission-Based Precautions: No Implantable device outside of the clinic excluding No Patient Has Alerts: No cellular tissue based products placed in the center since last visit: Has Dressing in Place as Prescribed: Yes Pain Present Now: Yes Electronic Signature(s) Signed: 05/10/2022 11:49:43 AM By: [name] Entered By: [name] on 05/09/2022 11:20:02 -------------------------------------------------------------------------------- Clinic Level of Care Assessment Details Patient Name: Date of Service: [name], [name] RLO TTE 05/09/2022 10:15 A M Medical Record Number: [id number] Patient Account Number: [id number] Date of Birth/Sex: Treating RN: [birth year] (77 y.o. [name], [name] Primary Care [name]: [name] Other Clinician: Referring Nathanyl Andujo: Treating Kalen Neidert/Extender: [name]: 22 Clinic Level of Care  Assessment Items TOOL 4 Quantity Score X- 1 0 Use when only an EandM is performed on FOLLOW-UP visit ASSESSMENTS - Nursing Assessment / Reassessment X- 1 10 Reassessment of Co-morbidities (includes updates in patient status) X- 1 5 Reassessment of Adherence to [name] Plan ASSESSMENTS - Wound and Skin A ssessment / Reassessment []  - 0 Simple Wound Assessment / Reassessment - one wound X- 2 5 Complex Wound Assessment / Reassessment - multiple wounds []  - 0 Dermatologic / Skin Assessment (not related to wound area) ASSESSMENTS - Focused Assessment X- 1 5 Circumferential Edema Measurements - multi extremities []  - 0 Nutritional Assessment / Counseling / Intervention [name] ( ) [id number] Page 2 of 11 []  - 0 Lower Extremity Assessment (monofilament, tuning fork, pulses) []  - 0 Peripheral Arterial Disease Assessment (using hand held doppler) ASSESSMENTS - Ostomy and/or Continence Assessment and Care []  - 0 Incontinence Assessment and Management []  - 0 Ostomy Care Assessment and Management (repouching, etc.) PROCESS - Coordination of Care []  - 0 Simple Patient / Family Education for ongoing care X- 1 20 Complex (extensive) Patient / Family Education for ongoing care X- 1 10 Staff obtains [name], Records, T Results / Process Orders est []  - 0 Staff telephones HHA, Nursing Homes / Clarify orders / etc []  - 0 Routine Transfer to another Facility (non-emergent condition) []  - 0 Routine Hospital Admission (non-emergent condition) []  - 0 New Admissions / [id number] / Ordering NPWT Apligraf, etc. , []  - 0 Emergency Hospital Admission (emergent condition) []  - 0 Simple Discharge Coordination X- 1 15 Complex (extensive) Discharge Coordination PROCESS - Special Needs []  - 0 Pediatric / Minor Patient Management []  - 0 Isolation Patient Management []  - 0 Hearing / Language / Visual special needs []  -  0 Assessment of Community assistance (transportation, D/C planning, etc.) []  - 0 Additional assistance / Altered mentation []  - 0 Support Surface(s) Assessment (bed, cushion, seat, etc.) INTERVENTIONS - Wound Cleansing / Measurement []  - 0 Simple Wound Cleansing - one wound  X- 2 5 Complex Wound Cleansing - multiple wounds X- 1 5 Wound Imaging (photographs - any number of wounds) []  - 0 Wound Tracing (instead of photographs) []  - 0 Simple Wound Measurement - one wound X- 2 5 Complex Wound Measurement - multiple wounds INTERVENTIONS - Wound Dressings []  - 0 Small Wound Dressing one or multiple wounds X- 2 15 Medium Wound Dressing one or multiple wounds []  - 0 Large Wound Dressing one or multiple wounds X- 1 5 Application of Medications - topical []  - 0 Application of Medications - injection INTERVENTIONS - Miscellaneous []  - 0 External ear exam []  - 0 Specimen Collection (cultures, biopsies, blood, body fluids, etc.) []  - 0 Specimen(s) / Culture(s) sent or taken to Lab for analysis X- 1 10 Patient Transfer (multiple staff / Civil Service fast streamer / Similar devices) []  - 0 Simple Staple / Suture removal (25 or less) []  - 0 Complex Staple / Suture removal (26 or more) []  - 0 Hypo / Hyperglycemic Management (close monitor of Blood Glucose) [name], [name] ([id number]) [id number] Page 3 of 11 []  - 0 Ankle / Brachial Index (ABI) - do not check if billed separately X- 1 5 Vital Signs Has the patient been seen at the hospital within the last three years: Yes Total Score: 150 Level Of Care: New/Established - Level 4 Electronic Signature(s) Signed: 05/15/2022 4:09:44 PM By: Rhae Hammock RN Entered By: Rhae Hammock on 05/09/2022 12:06:28 -------------------------------------------------------------------------------- Encounter Discharge Information Details Patient Name: Date of Service: [name] RLO TTE 05/09/2022 10:15 A M Medical Record  Number: [id number] Patient Account Number: [id number] Date of Birth/Sex: Treating RN: Feb 24, 1946 (77 y.o. [name], [name] Primary Care Serria Sloma: Fonnie Jarvis Other Clinician: Referring Alani Sabbagh: Treating Rendon Howell/Extender: Dorann Ou in [name]: 22 Encounter Discharge Information Items Discharge Condition: Stable Ambulatory Status: Wheelchair Discharge Destination: Home Transportation: Private Auto Accompanied By: aide Schedule Follow-up Appointment: Yes Clinical Summary of Care: Patient Declined Electronic Signature(s) Signed: 05/15/2022 4:09:44 PM By: Rhae Hammock RN Entered By: Rhae Hammock on 05/09/2022 12:07:06 -------------------------------------------------------------------------------- Lower Extremity Assessment Details Patient Name: Date of Service: [name] RLO TTE 05/09/2022 10:15 A M Medical Record Number: [id number] Patient Account Number: [id number] Date of Birth/Sex: Treating RN: 1945/09/14 (77 y.o. F) Primary Care Worth Kober: Fonnie Jarvis Other Clinician: Referring Anayah Arvanitis: Treating Donnelle Rubey/Extender: Dorann Ou in [name]: 22 Edema Assessment Assessed: [Left: Yes] [Right: No] Edema: [Left: Yes] [Right: Yes] Calf Left: Right: Point of Measurement: 30 cm From Medial Instep 29 cm Ankle Left: Right: Point of Measurement: 9 cm From Medial Instep 20 cm Vascular Assessment Dame, Chene ([id number]) [[id number] Page 4 of 11] Pulses: Dorsalis Pedis Palpable: [Left:Yes] Posterior Tibial Palpable: [Left:Yes] Electronic Signature(s) Signed: 05/10/2022 11:49:43 AM By: Erenest Blank Entered By: Erenest Blank on 05/09/2022 11:30:43 -------------------------------------------------------------------------------- Multi Wound Chart Details Patient Name: Date of Service: [name] RLO TTE 05/09/2022 10:15 A M Medical Record Number:  [id number] Patient Account Number: [id number] Date of Birth/Sex: Treating RN: 1945/05/23 (77 y.o. F) Primary Care Yaretzy Olazabal: Fonnie Jarvis Other Clinician: Referring Jawuan Robb: Treating Naileah Karg/Extender: Dorann Ou in [name]: 22 Vital Signs Height(in): 64 Pulse(bpm): 33 Weight(lbs): 110 Blood Pressure(mmHg): 118/78 Body Mass Index(BMI): 18.9 Temperature(F): 98.6 Respiratory Rate(breaths/min): 18 [1:Photos:] [N/A:N/A] Sacrum Left Calcaneus N/A Wound Location: Pressure Injury Pressure Injury N/A Wounding Event: Pressure Ulcer Pressure Ulcer N/A Primary Etiology: Cataracts, Chronic sinus Cataracts, Chronic sinus N/A Comorbid History: problems/congestion, Middle ear problems/congestion, Middle ear problems, Anemia, Asthma, problems, Anemia, Asthma, Osteoarthritis, Quadriplegia Osteoarthritis, Quadriplegia 11/02/2021 04/01/2022 N/A  Date Acquired: 22 5 N/A Weeks of [name]: Open Open N/A Wound Status: No No N/A Wound Recurrence: Yes No N/A Clustered Wound: 1 N/A N/A Clustered Quantity: 1x0.3x0.3 1.4x1.2x0.1 N/A Measurements L x W x D (cm) 0.236 1.319 N/A A (cm) : rea 0.071 0.132 N/A Volume (cm) : 98.50% 76.00% N/A % Reduction in A rea: 95.50% 88.00% N/A % Reduction in Volume: Category/Stage III Category/Stage III N/A Classification: Medium Medium N/A Exudate A mount: Serosanguineous Serosanguineous N/A Exudate Type: red, brown red, brown N/A Exudate Color: Distinct, outline attached Distinct, outline attached N/A Wound Margin: Large (67-100%) Large (67-100%) N/A Granulation A mount: Red Red, Pink N/A Granulation Quality: Small (1-33%) Small (1-33%) N/A Necrotic A mount: Adherent Slough Eschar, Adherent Slough N/A Necrotic Tissue: Fascia: No Fat Layer (Subcutaneous Tissue): Yes N/A Exposed Structures: Fat Layer (Subcutaneous Tissue): No Fascia: No Tendon: No Tendon: No Muscle: No Muscle: No Joint: No Joint:  No Bone: No Bone: No [name], [name] ([id number]) [id number] Page 5 of 11 Medium (34-66%) None N/A Epithelialization: Excoriation: No Excoriation: No N/A Periwound Skin Texture: Induration: No Induration: No Callus: No Callus: No Crepitus: No Crepitus: No Rash: No Rash: No Scarring: No Scarring: No Maceration: No Maceration: No N/A Periwound Skin Moisture: Dry/Scaly: No Dry/Scaly: No Atrophie Blanche: No Atrophie Blanche: No N/A Periwound Skin Color: Cyanosis: No Cyanosis: No Ecchymosis: No Ecchymosis: No Erythema: No Erythema: No Hemosiderin Staining: No Hemosiderin Staining: No Mottled: No Mottled: No Pallor: No Pallor: No Rubor: No Rubor: No N/A No Abnormality N/A Temperature: [name] Notes Electronic Signature(s) Signed: 05/09/2022 12:16:58 PM By: Geralyn Corwin DO Entered By: Geralyn Corwin on 05/09/2022 11:43:36 -------------------------------------------------------------------------------- Multi-Disciplinary Care Plan Details Patient Name: Date of Service: [name] RLO TTE 05/09/2022 10:15 A M Medical Record Number: [id number] Patient Account Number: [id number] Date of Birth/Sex: Treating RN: [birth year] (77 y.o. [name], [name] Primary Care Anagha Loseke: [name] Other Clinician: Referring Alanna Storti: Treating Kayen Grabel/Extender: [name]: 22 Active Inactive Nutrition Nursing Diagnoses: Potential for alteratiion in Nutrition/Potential for imbalanced nutrition Goals: Patient/caregiver agrees to and verbalizes understanding of need to obtain nutritional consultation Date Initiated: 12/03/2021 Target Resolution Date: 05/25/2022 Goal Status: Active Interventions: Assess patient nutrition upon admission and as needed per policy Provide education on nutrition [name] Activities: Education provided on Nutrition : 03/04/2022 Patient referred to Primary Care  Physician for further nutritional evaluation : 12/03/2021 Notes: Pain, Acute or Chronic Nursing Diagnoses: Pain, acute or chronic: actual or potential Potential alteration in comfort, pain Goals: Patient will verbalize adequate pain control and receive pain control interventions during procedures as needed Date Initiated: 12/03/2021 Target Resolution Date: 05/25/2022 Goal Status: Active Patient/caregiver will verbalize comfort level met Date Initiated: 12/03/2021 Target Resolution Date: 05/25/2022 [name], [name] ([id number]) [id number] Page 6 of 11 Goal Status: Active Interventions: Encourage patient to take pain medications as prescribed Provide education on pain management Reposition patient for comfort [name] Activities: Administer pain control measures as ordered : 12/03/2021 Notes: Pressure Nursing Diagnoses: Knowledge deficit related to management of pressures ulcers Potential for impaired tissue integrity related to pressure, friction, moisture, and shear Goals: Patient will remain free from development of additional pressure ulcers Date Initiated: 12/03/2021 Target Resolution Date: 05/25/2022 Goal Status: Active Interventions: Assess: immobility, friction, shearing, incontinence upon admission and as needed Assess offloading mechanisms upon admission and as needed Provide education on pressure ulcers [name] Activities: Patient referred for seating evaluation to ensure proper offloading : 12/03/2021 T ordered outside of clinic : 12/03/2021 est Notes: Electronic Signature(s) Signed: 05/15/2022  4:09:44 PM By: Rhae Hammock RN Entered By: Rhae Hammock on 05/09/2022 12:03:59 -------------------------------------------------------------------------------- Pain Assessment Details Patient Name: Date of Service: [name], [name] RLO TTE 05/09/2022 10:15 A M Medical Record Number: [id number] Patient Account Number: [id number] Date of Birth/Sex:  Treating RN: [birth year] (77 y.o. F) Primary Care Ranferi Clingan: Fonnie Jarvis Other Clinician: Referring Juel Bellerose: Treating Jafeth Mustin/Extender: Dorann Ou in [name]: 22 Active Problems Location of Pain Severity and Description of Pain Patient Has Paino Yes Site Locations Pain LocationKALEEAH, [name] ([id number]) [id number] Page 7 of 11 Pain Location: Generalized Pain, Pain in Ulcers Rate the pain. Current Pain Level: 7 Pain Management and Medication Current Pain Management: Electronic Signature(s) Signed: 05/10/2022 11:49:43 AM By: Erenest Blank Entered By: Erenest Blank on 05/09/2022 11:20:33 -------------------------------------------------------------------------------- Patient/Caregiver Education Details Patient Name: Date of Service: [name] RLO TTE 1/18/2024andnbsp10:15 [place] Record Number: [id number] Patient Account Number: [id number] Date of Birth/Gender: Treating RN: 12/31/1945 (77 y.o. [name], [name] Primary Care Physician: Fonnie Jarvis Other Clinician: Referring Physician: Treating Physician/Extender: Dorann Ou in [name]: 22 Education Assessment Education Provided To: Patient Education Topics Provided Wound/Skin Impairment: Methods: Explain/Verbal Responses: Reinforcements needed, State content correctly [employer]) Signed: 05/15/2022 4:09:44 PM By: Rhae Hammock RN Entered By: Rhae Hammock on 05/09/2022 12:04:13 -------------------------------------------------------------------------------- Wound Assessment Details Patient Name: Date of Service: [name] RLO TTE 05/09/2022 10:15 A M Medical Record Number: [id number] Patient Account Number: [id number] Date of Birth/Sex: Treating RN: 1945/08/24 (77 y.o. F) Primary Care Chana Lindstrom: Fonnie Jarvis Other Clinician: Referring Kendryck Lacroix: Treating [name]/Extender: Aloha Gell [place], [name] ([id number]) [id number] Page 8 of 11 Weeks in [name]: 22 Wound Status Wound Number: 1 Primary Pressure Ulcer Etiology: Wound Location: Sacrum Wound Open Wounding Event: Pressure Injury Status: Date Acquired: 11/02/2021 Comorbid Cataracts, Chronic sinus problems/congestion, Middle ear Weeks Of [name]: 22 History: problems, Anemia, Asthma, Osteoarthritis, Quadriplegia Clustered Wound: Yes Photos Wound Measurements Length: (cm) Width: (cm) Depth: (cm) Clustered Quantity: Area: (cm) Volume: (cm) 1 % Reduction in Area: 98.5% 0.3 % Reduction in Volume: 95.5% 0.3 Epithelialization: Medium (34-66%) 1 Tunneling: No 0.236 Undermining: No 0.071 Wound Description Classification: Category/Stage III Wound Margin: Distinct, outline attached Exudate Amount: Medium Exudate Type: Serosanguineous Exudate Color: red, brown Foul Odor After Cleansing: No Slough/Fibrino Yes Wound Bed Granulation Amount: Large (67-100%) Exposed Structure Granulation Quality: Red Fascia Exposed: No Necrotic Amount: Small (1-33%) Fat Layer (Subcutaneous Tissue) Exposed: No Necrotic Quality: Adherent Slough Tendon Exposed: No Muscle Exposed: No Joint Exposed: No Bone Exposed: No Periwound Skin Texture Texture Color No Abnormalities Noted: No No Abnormalities Noted: No Callus: No Atrophie Blanche: No Crepitus: No Cyanosis: No Excoriation: No Ecchymosis: No Induration: No Erythema: No Rash: No Hemosiderin Staining: No Scarring: No Mottled: No Pallor: No Moisture Rubor: No No Abnormalities Noted: No Dry / Scaly: No Maceration: No [name] Notes Wound #1 (Sacrum) Cleanser Soap and Water Discharge Instruction: May shower and wash wound with dial antibacterial soap and water prior to dressing change. Wound Cleanser Discharge Instruction: Cleanse the wound with wound cleanser prior to applying a clean dressing  using gauze sponges, not tissue or cotton balls. [name], [name] ([id number]) [id number] Page 9 of 11 Skin Prep Discharge Instruction: Use skin prep as directed Zinc Oxide Ointment 30g tube Discharge Instruction: Apply Zinc Oxide to periwound with each dressing change Topical Primary Dressing Santyl Ointment Discharge Instruction: Apply nickel thick amount to wound bed as instructed Hydrofera Blue Ready Foam, 4x5 in Discharge Instruction: Apply to wound bed as instructed Secondary  Dressing Zetuvit Plus Silicone Border Dressing 7x7(in/in) Discharge Instruction: Apply silicone border over primary dressing as directed. Secured With Compression Wrap Compression Stockings Add-Ons Electronic Signature(s) Signed: 05/10/2022 11:49:43 AM By: [name] Entered By: [name] on 05/09/2022 11:33:39 -------------------------------------------------------------------------------- Wound Assessment Details Patient Name: Date of Service: [name] RLO TTE 05/09/2022 10:15 A M Medical Record Number: [id number] Patient Account Number: [id number] Date of Birth/Sex: Treating RN: 04-May-1945 (77 y.o. F) Primary Care Betzy Barbier: [name] Other Clinician: Referring Briar Witherspoon: Treating Haeleigh Streiff/Extender: [name]: 22 Wound Status Wound Number: 6 Primary Pressure Ulcer Etiology: Wound Location: Left Calcaneus Wound Open Wounding Event: Pressure Injury Status: Date Acquired: 04/01/2022 Comorbid Cataracts, Chronic sinus problems/congestion, Middle ear Weeks Of [name]: 5 History: problems, Anemia, Asthma, Osteoarthritis, Quadriplegia Clustered Wound: No Photos Wound Measurements Length: (cm) 1.4 Width: (cm) 1.2 Depth: (cm) 0.1 Area: (cm) 1.319 Volume: (cm) 0.132 [name], Niyana ([id number]) Wound Description Classification: Category/Stage III Wound Margin: Distinct, outline  attached Exudate Amount: Medium Exudate Type: Serosanguineous Exudate Color: red, brown Foul Odor After Cleansing: No Slough/Fibrino Yes % Reduction in Area: 76% % Reduction in Volume: 88% Epithelialization: None Tunneling: No Undermining: No [id number] Page 10 of 11 Wound Bed Granulation Amount: Large (67-100%) Exposed Structure Granulation Quality: Red, Pink Fascia Exposed: No Necrotic Amount: Small (1-33%) Fat Layer (Subcutaneous Tissue) Exposed: Yes Necrotic Quality: Eschar, Adherent Slough Tendon Exposed: No Muscle Exposed: No Joint Exposed: No Bone Exposed: No Periwound Skin Texture Texture Color No Abnormalities Noted: No No Abnormalities Noted: No Callus: No Atrophie Blanche: No Crepitus: No Cyanosis: No Excoriation: No Ecchymosis: No Induration: No Erythema: No Rash: No Hemosiderin Staining: No Scarring: No Mottled: No Pallor: No Moisture Rubor: No No Abnormalities Noted: No Dry / Scaly: No Temperature / Pain Maceration: No Temperature: No Abnormality [name] Notes Wound #6 (Calcaneus) Wound Laterality: Left Cleanser Wound Cleanser Discharge Instruction: Cleanse the wound with wound cleanser prior to applying a clean dressing using gauze sponges, not tissue or cotton balls. Peri-Wound Care Topical Primary Dressing Hydrofera Blue Ready Transfer Foam, 2.5x2.5 (in/in) Discharge Instruction: Apply directly to wound bed as directed MediHoney Gel, tube 1.5 (oz) Discharge Instruction: Apply to wound bed as instructed Secondary Dressing Woven Gauze Sponge, Non-Sterile 4x4 in Discharge Instruction: Apply over primary dressing as directed. Zetuvit Plus Silicone Border Dressing 4x4 (in/in) Discharge Instruction: Apply silicone border over primary dressing as directed. Secured With Compression Wrap Compression Stockings Add-Ons Electronic Signature(s) Signed: 05/10/2022 11:49:43 AM By: [name] Entered By: [name] on 05/09/2022 11:34:00 [name] ([id number]) [id number] Page 11 of 11 -------------------------------------------------------------------------------- Vitals Details Patient Name: Date of Service: MARIAELENA, CADE RLO TTE 05/09/2022 10:15 A M Medical Record Number: [id number] Patient Account Number: [id number] Date of Birth/Sex: Treating RN: 1945/09/19 (77 y.o. F) Primary Care Tarrin Lebow: [name] Other Clinician: Referring Tearra Ouk: Treating Tocarra Gassen/Extender: [name]: 22 Vital Signs Time Taken: 11:20 Temperature (F): 98.6 Height (in): 64 Pulse (bpm): 73 Weight (lbs): 110 Respiratory Rate (breaths/min): 18 Body Mass Index (BMI): 18.9 Blood Pressure (mmHg): 118/78 Reference Range: 80 - 120 mg / dl Electronic Signature(s) Signed: 05/10/2022 11:49:43 AM By: [name] Entered By: [name] on 05/09/2022 11:20:24
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
## 2022-05-20 ENCOUNTER — Ambulatory Visit: Payer: PPO | Admitting: Occupational Therapy

## 2022-05-20 DIAGNOSIS — M25622 Stiffness of left elbow, not elsewhere classified: Secondary | ICD-10-CM

## 2022-05-20 DIAGNOSIS — G8252 Quadriplegia, C1-C4 incomplete: Secondary | ICD-10-CM

## 2022-05-20 DIAGNOSIS — M25621 Stiffness of right elbow, not elsewhere classified: Secondary | ICD-10-CM

## 2022-05-20 DIAGNOSIS — G8250 Quadriplegia, unspecified: Secondary | ICD-10-CM

## 2022-05-20 DIAGNOSIS — M6281 Muscle weakness (generalized): Secondary | ICD-10-CM | POA: Diagnosis not present

## 2022-05-20 DIAGNOSIS — M25611 Stiffness of right shoulder, not elsewhere classified: Secondary | ICD-10-CM

## 2022-05-20 DIAGNOSIS — M25642 Stiffness of left hand, not elsewhere classified: Secondary | ICD-10-CM

## 2022-05-20 DIAGNOSIS — M25612 Stiffness of left shoulder, not elsewhere classified: Secondary | ICD-10-CM

## 2022-05-20 DIAGNOSIS — M25641 Stiffness of right hand, not elsewhere classified: Secondary | ICD-10-CM

## 2022-05-20 DIAGNOSIS — R278 Other lack of coordination: Secondary | ICD-10-CM

## 2022-05-20 DIAGNOSIS — R5381 Other malaise: Secondary | ICD-10-CM
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
## 2022-05-22 ENCOUNTER — Ambulatory Visit: Payer: PPO | Admitting: Occupational Therapy

## 2022-05-22 ENCOUNTER — Encounter: Payer: Self-pay | Admitting: Occupational Therapy

## 2022-05-22 DIAGNOSIS — M6281 Muscle weakness (generalized): Secondary | ICD-10-CM

## 2022-05-22 DIAGNOSIS — R278 Other lack of coordination: Secondary | ICD-10-CM

## 2022-05-22 DIAGNOSIS — M25622 Stiffness of left elbow, not elsewhere classified: Secondary | ICD-10-CM

## 2022-05-22 DIAGNOSIS — M25641 Stiffness of right hand, not elsewhere classified: Secondary | ICD-10-CM

## 2022-05-22 DIAGNOSIS — M25642 Stiffness of left hand, not elsewhere classified: Secondary | ICD-10-CM

## 2022-05-22 DIAGNOSIS — M25621 Stiffness of right elbow, not elsewhere classified: Secondary | ICD-10-CM

## 2022-05-22 DIAGNOSIS — M25612 Stiffness of left shoulder, not elsewhere classified: Secondary | ICD-10-CM

## 2022-05-22 DIAGNOSIS — M25611 Stiffness of right shoulder, not elsewhere classified: Secondary | ICD-10-CM

## 2022-05-22 DIAGNOSIS — G8250 Quadriplegia, unspecified: Secondary | ICD-10-CM

## 2022-05-22 DIAGNOSIS — G8252 Quadriplegia, C1-C4 incomplete: Secondary | ICD-10-CM
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
## 2022-05-23 ENCOUNTER — Ambulatory Visit (HOSPITAL_BASED_OUTPATIENT_CLINIC_OR_DEPARTMENT_OTHER): Payer: PPO | Admitting: Internal Medicine

## 2022-05-24 ENCOUNTER — Encounter (HOSPITAL_BASED_OUTPATIENT_CLINIC_OR_DEPARTMENT_OTHER): Payer: PPO | Attending: Internal Medicine | Admitting: Internal Medicine

## 2022-05-24 DIAGNOSIS — M1990 Unspecified osteoarthritis, unspecified site: Secondary | ICD-10-CM | POA: Diagnosis not present

## 2022-05-24 DIAGNOSIS — L89153 Pressure ulcer of sacral region, stage 3: Secondary | ICD-10-CM | POA: Insufficient documentation

## 2022-05-24 DIAGNOSIS — G35 Multiple sclerosis: Secondary | ICD-10-CM | POA: Diagnosis not present

## 2022-05-24 DIAGNOSIS — E039 Hypothyroidism, unspecified: Secondary | ICD-10-CM | POA: Diagnosis not present

## 2022-05-24 DIAGNOSIS — L89623 Pressure ulcer of left heel, stage 3: Secondary | ICD-10-CM | POA: Insufficient documentation

## 2022-05-24 DIAGNOSIS — I442 Atrioventricular block, complete: Secondary | ICD-10-CM | POA: Diagnosis not present

## 2022-05-24 DIAGNOSIS — Z950 Presence of cardiac pacemaker: Secondary | ICD-10-CM | POA: Diagnosis not present

## 2022-05-24 DIAGNOSIS — J45909 Unspecified asthma, uncomplicated: Secondary | ICD-10-CM | POA: Insufficient documentation

## 2022-05-24 DIAGNOSIS — G8250 Quadriplegia, unspecified: Secondary | ICD-10-CM | POA: Diagnosis not present

## 2022-05-24 NOTE — Patient Instructions (Signed)
Orders Placed This Encounter  Procedures   Wheelchair   Ambulatory referral to Physical Therapy

## 2022-05-24 NOTE — Addendum Note (Signed)
Addended by: Marcial Pacas on: 05/24/2022 01:05 PM   Modules accepted: Orders

## 2022-05-27 ENCOUNTER — Ambulatory Visit: Payer: PPO | Admitting: Occupational Therapy

## 2022-05-29 ENCOUNTER — Ambulatory Visit: Payer: PPO | Admitting: Occupational Therapy
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
## 2022-06-03 ENCOUNTER — Ambulatory Visit: Payer: PPO | Attending: Physician Assistant | Admitting: Occupational Therapy

## 2022-06-03 ENCOUNTER — Ambulatory Visit: Payer: PPO | Admitting: Physical Therapy

## 2022-06-03 DIAGNOSIS — M25612 Stiffness of left shoulder, not elsewhere classified: Secondary | ICD-10-CM | POA: Insufficient documentation

## 2022-06-03 DIAGNOSIS — R278 Other lack of coordination: Secondary | ICD-10-CM | POA: Diagnosis present

## 2022-06-03 DIAGNOSIS — M25642 Stiffness of left hand, not elsewhere classified: Secondary | ICD-10-CM

## 2022-06-03 DIAGNOSIS — M25621 Stiffness of right elbow, not elsewhere classified: Secondary | ICD-10-CM

## 2022-06-03 DIAGNOSIS — M25641 Stiffness of right hand, not elsewhere classified: Secondary | ICD-10-CM | POA: Diagnosis present

## 2022-06-03 DIAGNOSIS — M25611 Stiffness of right shoulder, not elsewhere classified: Secondary | ICD-10-CM | POA: Diagnosis present

## 2022-06-03 DIAGNOSIS — M6281 Muscle weakness (generalized): Secondary | ICD-10-CM | POA: Diagnosis present

## 2022-06-03 DIAGNOSIS — M25622 Stiffness of left elbow, not elsewhere classified: Secondary | ICD-10-CM

## 2022-06-03 DIAGNOSIS — G8250 Quadriplegia, unspecified: Secondary | ICD-10-CM | POA: Diagnosis present

## 2022-06-03 DIAGNOSIS — G8252 Quadriplegia, C1-C4 incomplete: Secondary | ICD-10-CM | POA: Diagnosis present
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
## 2022-06-04 ENCOUNTER — Encounter (HOSPITAL_BASED_OUTPATIENT_CLINIC_OR_DEPARTMENT_OTHER): Payer: PPO | Admitting: Internal Medicine

## 2022-06-04 DIAGNOSIS — L89623 Pressure ulcer of left heel, stage 3: Secondary | ICD-10-CM

## 2022-06-04 DIAGNOSIS — L89153 Pressure ulcer of sacral region, stage 3: Secondary | ICD-10-CM | POA: Diagnosis not present

## 2022-06-05 ENCOUNTER — Encounter: Payer: Self-pay | Admitting: Occupational Therapy

## 2022-06-05 ENCOUNTER — Ambulatory Visit: Payer: PPO | Admitting: Occupational Therapy

## 2022-06-05 DIAGNOSIS — G8250 Quadriplegia, unspecified: Secondary | ICD-10-CM

## 2022-06-05 DIAGNOSIS — M6281 Muscle weakness (generalized): Secondary | ICD-10-CM | POA: Diagnosis not present

## 2022-06-05 DIAGNOSIS — M25611 Stiffness of right shoulder, not elsewhere classified: Secondary | ICD-10-CM

## 2022-06-05 DIAGNOSIS — M25642 Stiffness of left hand, not elsewhere classified: Secondary | ICD-10-CM

## 2022-06-05 DIAGNOSIS — R278 Other lack of coordination: Secondary | ICD-10-CM

## 2022-06-05 DIAGNOSIS — M25612 Stiffness of left shoulder, not elsewhere classified: Secondary | ICD-10-CM

## 2022-06-05 DIAGNOSIS — M25622 Stiffness of left elbow, not elsewhere classified: Secondary | ICD-10-CM

## 2022-06-05 DIAGNOSIS — M25641 Stiffness of right hand, not elsewhere classified: Secondary | ICD-10-CM

## 2022-06-05 DIAGNOSIS — G8252 Quadriplegia, C1-C4 incomplete: Secondary | ICD-10-CM

## 2022-06-05 DIAGNOSIS — M25621 Stiffness of right elbow, not elsewhere classified: Secondary | ICD-10-CM
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
## 2022-06-07 ENCOUNTER — Encounter (HOSPITAL_BASED_OUTPATIENT_CLINIC_OR_DEPARTMENT_OTHER): Payer: PPO | Admitting: Internal Medicine

## 2022-06-07 NOTE — Progress Notes (Signed)
KISTY, KAMAN ([id number]) [id number] Page 1 of 8 Visit Report for 06/04/2022 Arrival Information Details Patient Name: Date of Service: [name], [name] RLO TTE 06/04/2022 3:00 PM Medical Record Number: [id number] Patient Account Number: [id number] Date of Birth/Sex: Treating RN: 30-Jun-1945 (77 y.o. [name], [name] Primary Care [name]: [name] Other Clinician: Referring Brand Siever: Treating [name]/Extender: [name] in Treatment: 26 Visit Information History Since Last Visit Added or deleted any medications: No Patient Arrived: Wheel Chair Any new allergies or adverse reactions: No Arrival Time: 15:29 Had a fall or experienced change in No Accompanied By: caregiver activities of daily living that may affect Transfer Assistance: [name] Lift risk of falls: Patient Identification Verified: Yes Signs or symptoms of abuse/neglect since last visito No Secondary Verification Process Completed: Yes Hospitalized since last visit: Yes Patient Requires Transmission-Based Precautions: No Implantable device outside of the clinic excluding No Patient Has Alerts: No cellular tissue based products placed in the center since last visit: Has Dressing in Place as Prescribed: Yes Pain Present Now: Yes Electronic Signature(s) Signed: 06/05/2022 5:48:24 PM By: Erin Pilling RN, BSN Entered By: [name] on 06/04/2022 15:29:44 -------------------------------------------------------------------------------- Clinic Level of Care Assessment Details Patient Name: Date of Service: [name], [name] RLO TTE 06/04/2022 3:00 PM Medical Record Number: [id number] Patient Account Number: [id number] Date of Birth/Sex: Treating RN: 1945-10-01 (77 y.o. [name] Primary Care Camari Quintanilla: [name] Other Clinician: Referring Evani Shrider: Treating [name]/Extender: [name] in Treatment: 26 Clinic Level  of Care Assessment Items TOOL 4 Quantity Score X- 1 0 Use when only an EandM is performed on FOLLOW-UP visit ASSESSMENTS - Nursing Assessment / Reassessment X- 1 10 Reassessment of Co-morbidities (includes updates in patient status) X- 1 5 Reassessment of Adherence to Treatment Plan ASSESSMENTS - Wound and Skin A ssessment / Reassessment X - Simple Wound Assessment / Reassessment - one wound 1 5 []$  - 0 Complex Wound Assessment / Reassessment - multiple wounds X- 1 10 Dermatologic / Skin Assessment (not related to wound area) ASSESSMENTS - Focused Assessment X- 1 5 Circumferential Edema Measurements - multi extremities X- 1 10 Nutritional Assessment / Counseling / Intervention Erin, [name] ([id number]) [id number] Page 2 of 8 []$  - 0 Lower Extremity Assessment (monofilament, tuning fork, pulses) []$  - 0 Peripheral Arterial Disease Assessment (using hand held doppler) ASSESSMENTS - Ostomy and/or Continence Assessment and Care []$  - 0 Incontinence Assessment and Management []$  - 0 Ostomy Care Assessment and Management (repouching, etc.) PROCESS - Coordination of Care X - Simple Patient / Family Education for ongoing care 1 15 []$  - 0 Complex (extensive) Patient / Family Education for ongoing care X- 1 10 Staff obtains Programmer, systems, Records, T Results / Process Orders est X- 1 10 Staff telephones HHA, Nursing Homes / Clarify orders / etc []$  - 0 Routine Transfer to another Facility (non-emergent condition) []$  - 0 Routine Hospital Admission (non-emergent condition) []$  - 0 New Admissions / Biomedical engineer / Ordering NPWT Apligraf, etc. , []$  - 0 Emergency Hospital Admission (emergent condition) []$  - 0 Simple Discharge Coordination X- 1 15 Complex (extensive) Discharge Coordination PROCESS - Special Needs []$  - 0 Pediatric / Minor Patient Management []$  - 0 Isolation Patient Management []$  - 0 Hearing / Language / Visual special needs []$  -  0 Assessment of Community assistance (transportation, D/C planning, etc.) []$  - 0 Additional assistance / Altered mentation []$  - 0 Support Surface(s) Assessment (bed, cushion, seat, etc.) INTERVENTIONS - Wound Cleansing / Measurement []$  - 0 Simple Wound  Cleansing - one wound X- 2 5 Complex Wound Cleansing - multiple wounds X- 1 5 Wound Imaging (photographs - any number of wounds) []$  - 0 Wound Tracing (instead of photographs) []$  - 0 Simple Wound Measurement - one wound X- 2 5 Complex Wound Measurement - multiple wounds INTERVENTIONS - Wound Dressings X - Small Wound Dressing one or multiple wounds 1 10 []$  - 0 Medium Wound Dressing one or multiple wounds []$  - 0 Large Wound Dressing one or multiple wounds []$  - 0 Application of Medications - topical []$  - 0 Application of Medications - injection INTERVENTIONS - Miscellaneous []$  - 0 External ear exam []$  - 0 Specimen Collection (cultures, biopsies, blood, body fluids, etc.) []$  - 0 Specimen(s) / Culture(s) sent or taken to Lab for analysis X- 1 10 Patient Transfer (multiple staff / Civil Service fast streamer / Similar devices) []$  - 0 Simple Staple / Suture removal (25 or less) []$  - 0 Complex Staple / Suture removal (26 or more) []$  - 0 Hypo / Hyperglycemic Management (close monitor of Blood Glucose) Vi, Ciclaly ([id number].pdf Page 3 of 8 []$  - 0 Ankle / Brachial Index (ABI) - do not check if billed separately X- 1 5 Vital Signs Has the patient been seen at the hospital within the last three years: Yes Total Score: 145 Level Of Care: New/Established - Level 4 Electronic Signature(s) Signed: 06/05/2022 5:48:24 PM By: Erin Pilling RN, BSN Entered By: [name] on 06/05/2022 11:26:03 -------------------------------------------------------------------------------- Encounter Discharge Information Details Patient Name: Date of Service: [name] RLO TTE 06/04/2022 3:00 PM Medical Record Number:  [id number] Patient Account Number: [id number] Date of Birth/Sex: Treating RN: February 01, 1946 (77 y.o. [name], [name] Primary Care Mikell Camp: [name] Other Clinician: Referring [name]: Treating Concetta Guion/Extender: [name] in Treatment: 26 Encounter Discharge Information Items Discharge Condition: Stable Ambulatory Status: Wheelchair Discharge Destination: Home Transportation: Private Auto Accompanied By: self Schedule Follow-up Appointment: Yes Clinical Summary of Care: Electronic Signature(s) Signed: 06/05/2022 5:48:24 PM By: Erin Pilling RN, BSN Entered By: [name] on 06/05/2022 11:40:01 -------------------------------------------------------------------------------- Lower Extremity Assessment Details Patient Name: Date of Service: [name], [name] RLO TTE 06/04/2022 3:00 PM Medical Record Number: [id number] Patient Account Number: [id number] Date of Birth/Sex: Treating RN: 22-Nov-1945 (77 y.o. [name] Primary Care Tammy Ericsson: [name] Other Clinician: Referring Wretha Laris: Treating Damari Suastegui/Extender: Aloha Gell Weeks in Treatment: 26 Edema Assessment Assessed: [Left: Yes] [Right: No] Edema: [Left: N] [Right: o] Calf Left: Right: Point of Measurement: From Medial Instep 29 cm Ankle Left: Right: Point of Measurement: From Medial Instep 19 cm Electronic Signature(s) Signed: 06/05/2022 5:48:24 PM By: Erin Pilling RN, BSN Rudolph, Baldo Ash [id number] Pilling RN, BSN [phone].pdf Page 4 of 8 Signed: 06/05/2022 5:48:24 PM Entered By: [name] on 06/04/2022 15:30:43 -------------------------------------------------------------------------------- Multi Wound Chart Details Patient Name: Date of Service: [name], [name] RLO TTE 06/04/2022 3:00 PM Medical Record Number: [id number] Patient Account Number: [id number] Date of Birth/Sex: Treating RN: 1945-09-26 (76 y.o.  F) Primary Care Argusta Mcgann: [name] Other Clinician: Referring Gladiola Madore: Treating Maleaha Hughett/Extender: [name] in Treatment: 26 Vital Signs Height(in): 64 Pulse(bpm): Weight(lbs): 110 Blood Pressure(mmHg): Body Mass Index(BMI): 18.9 Temperature(F): 98.5 Respiratory Rate(breaths/min): 16 [6:Photos:] [N/A:N/A] Left Calcaneus N/A N/A Wound Location: Pressure Injury N/A N/A Wounding Event: Pressure Ulcer N/A N/A Primary Etiology: Cataracts, Chronic sinus N/A N/A Comorbid History: problems/congestion, Middle ear problems, Anemia, Asthma, Osteoarthritis, Quadriplegia 04/01/2022 N/A N/A Date Acquired: 9 N/A N/A Weeks of Treatment: Open N/A N/A Wound Status: No N/A  N/A Wound Recurrence: 0.6x1.1x0.1 N/A N/A Measurements L x W x D (cm) 0.518 N/A N/A A (cm) : rea 0.052 N/A N/A Volume (cm) : 90.60% N/A N/A % Reduction in A rea: 95.30% N/A N/A % Reduction in Volume: Category/Stage III N/A N/A Classification: Medium N/A N/A Exudate A mount: Serosanguineous N/A N/A Exudate Type: red, brown N/A N/A Exudate Color: Distinct, outline attached N/A N/A Wound Margin: Large (67-100%) N/A N/A Granulation A mount: Red, Pink N/A N/A Granulation Quality: None Present (0%) N/A N/A Necrotic A mount: Fat Layer (Subcutaneous Tissue): Yes N/A N/A Exposed Structures: Fascia: No Tendon: No Muscle: No Joint: No Bone: No Small (1-33%) N/A N/A Epithelialization: Excoriation: No N/A N/A Periwound Skin Texture: Induration: No Callus: No Crepitus: No Rash: No Scarring: No Maceration: No N/A N/A Periwound Skin Moisture: Dry/Scaly: No Atrophie Blanche: No N/A N/A Periwound Skin Color: Cyanosis: No Ecchymosis: No Erythema: No [name], [name] ([id number]) [phone].pdf Page 5 of 8 Hemosiderin Staining: No Mottled: No Pallor: No Rubor: No No Abnormality N/A N/A Temperature: Treatment Notes Electronic  Signature(s) Signed: 06/04/2022 3:49:14 PM By: Kalman Shan DO Entered By: Kalman Shan on 06/04/2022 15:37:46 -------------------------------------------------------------------------------- Multi-Disciplinary Care Plan Details Patient Name: Date of Service: [name] RLO TTE 06/04/2022 3:00 PM Medical Record Number: [id number] Patient Account Number: [id number] Date of Birth/Sex: Treating RN: 04/08/1946 (77 y.o. [name], [name] Primary Care Eriq Hufford: [name] Other Clinician: Referring Anushree Dorsi: Treating Deigo Alonso/Extender: [name] in Treatment: 26 Active Inactive Pain, Acute or Chronic Nursing Diagnoses: Pain, acute or chronic: actual or potential Potential alteration in comfort, pain Goals: Patient will verbalize adequate pain control and receive pain control interventions during procedures as needed Date Initiated: 12/03/2021 Target Resolution Date: 05/25/2022 Goal Status: Active Patient/caregiver will verbalize comfort level met Date Initiated: 12/03/2021 Target Resolution Date: 05/25/2022 Goal Status: Active Interventions: Encourage patient to take pain medications as prescribed Provide education on pain management Reposition patient for comfort Treatment Activities: Administer pain control measures as ordered : 12/03/2021 Notes: Pressure Nursing Diagnoses: Knowledge deficit related to management of pressures ulcers Potential for impaired tissue integrity related to pressure, friction, moisture, and shear Goals: Patient will remain free from development of additional pressure ulcers Date Initiated: 12/03/2021 Target Resolution Date: 05/25/2022 Goal Status: Active Interventions: Assess: immobility, friction, shearing, incontinence upon admission and as needed Assess offloading mechanisms upon admission and as needed Provide education on pressure ulcers Treatment Activities: Patient referred for seating evaluation to ensure  proper offloading : 12/03/2021 T ordered outside of clinic : 12/03/2021 est [name], [name] ([id number]) [id number].pdf Page 6 of 8 Notes: Electronic Signature(s) Signed: 06/05/2022 5:48:24 PM By: Erin Pilling RN, BSN Entered By: [name] on 06/05/2022 11:25:09 -------------------------------------------------------------------------------- Pain Assessment Details Patient Name: Date of Service: [name] RLO TTE 06/04/2022 3:00 PM Medical Record Number: [id number] Patient Account Number: [id number] Date of Birth/Sex: Treating RN: 09-29-1945 (77 y.o. [name] Primary Care Devyn Griffing: [name] Other Clinician: Referring Kalena Mander: Treating Athen Riel/Extender: [name] in Treatment: 26 Active Problems Location of Pain Severity and Description of Pain Patient Has Paino Yes Site Locations Pain Location: Generalized Pain, Pain in Ulcers Rate the pain. Current Pain Level: 5 Pain Management and Medication Current Pain Management: Medication: No Cold Application: No Rest: No Massage: No Activity: No T.E.N.S.: No Heat Application: No Leg drop or elevation: No Is the Current Pain Management Adequate: Adequate How does your wound impact your activities of daily livingo Sleep: No Bathing: No Appetite: No Relationship With Others: No Bladder  Continence: No Emotions: No Bowel Continence: No Work: No Toileting: No Drive: No Dressing: No Hobbies: No Electronic Signature(s) Signed: 06/05/2022 5:48:24 PM By: Erin Pilling RN, BSN Entered By: [name] on 06/04/2022 15:30:06 [name] ([id number].pdf Page 7 of 8 -------------------------------------------------------------------------------- Wound Assessment Details Patient Name: Date of Service: [name], [name] RLO TTE 06/04/2022 3:00 PM Medical Record Number: [id number] Patient Account Number: [id number] Date of  Birth/Sex: Treating RN: 1945-08-24 (77 y.o. [name] Primary Care Avram Danielson: [name] Other Clinician: Referring Chizuko Trine: Treating Merlene Dante/Extender: [name] in Treatment: 26 Wound Status Wound Number: 6 Primary Pressure Ulcer Etiology: Wound Location: Left Calcaneus Wound Open Wounding Event: Pressure Injury Status: Date Acquired: 04/01/2022 Comorbid Cataracts, Chronic sinus problems/congestion, Middle ear Weeks Of Treatment: 9 History: problems, Anemia, Asthma, Osteoarthritis, Quadriplegia Clustered Wound: No Photos Wound Measurements Length: (cm) 0. Width: (cm) 1. Depth: (cm) 0. Area: (cm) 0 Volume: (cm) 0 6 % Reduction in Area: 90.6% 1 % Reduction in Volume: 95.3% 1 Epithelialization: Small (1-33%) .518 Tunneling: No .052 Undermining: No Wound Description Classification: Category/Stage III Wound Margin: Distinct, outline attached Exudate Amount: Medium Exudate Type: Serosanguineous Exudate Color: red, brown Foul Odor After Cleansing: No Slough/Fibrino No Wound Bed Granulation Amount: Large (67-100%) Exposed Structure Granulation Quality: Red, Pink Fascia Exposed: No Necrotic Amount: None Present (0%) Fat Layer (Subcutaneous Tissue) Exposed: Yes Tendon Exposed: No Muscle Exposed: No Joint Exposed: No Bone Exposed: No Periwound Skin Texture Texture Color No Abnormalities Noted: No No Abnormalities Noted: No Callus: No Atrophie Blanche: No Crepitus: No Cyanosis: No Excoriation: No Ecchymosis: No Induration: No Erythema: No Rash: No Hemosiderin Staining: No Scarring: No Mottled: No Pallor: No Moisture Rubor: No No Abnormalities Noted: No Dry / Scaly: No Temperature / Pain Maceration: No Temperature: No Abnormality [name], [name] ([id number].pdf Page 8 of 8 Treatment Notes Wound #6 (Calcaneus) Wound Laterality: Left Cleanser Wound Cleanser Discharge  Instruction: Cleanse the wound with wound cleanser prior to applying a clean dressing using gauze sponges, not tissue or cotton balls. Peri-Wound Care Topical Primary Dressing Hydrofera Blue Ready Transfer Foam, 2.5x2.5 (in/in) Discharge Instruction: Apply directly to wound bed as directed Santyl Ointment Discharge Instruction: Apply nickel thick amount to wound bed as instructed Secondary Dressing Woven Gauze Sponge, Non-Sterile 4x4 in Discharge Instruction: Apply over primary dressing as directed. Zetuvit Plus Silicone Border Dressing 4x4 (in/in) Discharge Instruction: Apply silicone border over primary dressing as directed. Secured With Compression Wrap Compression Stockings Environmental education officer) Signed: 06/05/2022 5:48:24 PM By: Erin Pilling RN, BSN Entered By: [name] on 06/04/2022 15:33:46 -------------------------------------------------------------------------------- Vitals Details Patient Name: Date of Service: [name] RLO TTE 06/04/2022 3:00 PM Medical Record Number: [id number] Patient Account Number: [id number] Date of Birth/Sex: Treating RN: 07-18-1945 (77 y.o. [name], Meta.Reding Primary Care Ramina Hulet: [name] Other Clinician: Referring Thai Burgueno: Treating Torryn Hudspeth/Extender: [name] in Treatment: 26 Vital Signs Time Taken: 15:29 Temperature (F): 98.5 Height (in): 64 Respiratory Rate (breaths/min): 16 Weight (lbs): 110 Reference Range: 80 - 120 mg / dl Body Mass Index (BMI): 18.9 Electronic Signature(s) Signed: 06/05/2022 5:48:24 PM By: Erin Pilling RN, BSN Entered By: [name] on 06/04/2022 15:35:51

## 2022-06-10 ENCOUNTER — Encounter: Payer: Self-pay | Admitting: Occupational Therapy

## 2022-06-10 NOTE — Therapy (Signed)
Occupational Therapy Discharge Summary   Patient: [name] MRN: [id number] Date of Birth: 26-Apr-1945  June 10, 2022   The above patient had been seen in Occupational Therapy but is being discharged due to hospitalization and will require new OP OT order to resume therapy services as medically appropriate.    Murlean Caller, OTR/L 06/10/2022 [place] Phone: [phone] Fax: [phone]

## 2022-06-12 ENCOUNTER — Encounter: Payer: Self-pay | Admitting: Occupational Therapy

## 2022-06-17 ENCOUNTER — Encounter: Payer: Self-pay | Admitting: Occupational Therapy

## 2022-06-19 ENCOUNTER — Encounter: Payer: Self-pay | Admitting: Occupational Therapy

## 2022-06-24 ENCOUNTER — Encounter: Payer: Self-pay | Admitting: Occupational Therapy

## 2022-06-25 ENCOUNTER — Encounter (HOSPITAL_BASED_OUTPATIENT_CLINIC_OR_DEPARTMENT_OTHER): Payer: PPO | Attending: Internal Medicine | Admitting: Internal Medicine

## 2022-06-25 DIAGNOSIS — G8250 Quadriplegia, unspecified: Secondary | ICD-10-CM | POA: Diagnosis not present

## 2022-06-25 DIAGNOSIS — L89623 Pressure ulcer of left heel, stage 3: Secondary | ICD-10-CM | POA: Diagnosis not present

## 2022-06-25 DIAGNOSIS — Z950 Presence of cardiac pacemaker: Secondary | ICD-10-CM | POA: Insufficient documentation

## 2022-06-25 DIAGNOSIS — I442 Atrioventricular block, complete: Secondary | ICD-10-CM | POA: Diagnosis not present

## 2022-06-25 DIAGNOSIS — E039 Hypothyroidism, unspecified: Secondary | ICD-10-CM | POA: Insufficient documentation

## 2022-06-25 DIAGNOSIS — L89153 Pressure ulcer of sacral region, stage 3: Secondary | ICD-10-CM | POA: Diagnosis present

## 2022-06-25 DIAGNOSIS — G35 Multiple sclerosis: Secondary | ICD-10-CM | POA: Diagnosis not present

## 2022-06-26 ENCOUNTER — Encounter: Payer: Self-pay | Admitting: Occupational Therapy

## 2022-06-26 ENCOUNTER — Ambulatory Visit: Payer: PPO | Attending: Physician Assistant | Admitting: Occupational Therapy

## 2022-06-26 DIAGNOSIS — G8250 Quadriplegia, unspecified: Secondary | ICD-10-CM | POA: Diagnosis present

## 2022-06-26 DIAGNOSIS — M25642 Stiffness of left hand, not elsewhere classified: Secondary | ICD-10-CM | POA: Insufficient documentation

## 2022-06-26 DIAGNOSIS — M25622 Stiffness of left elbow, not elsewhere classified: Secondary | ICD-10-CM | POA: Diagnosis present

## 2022-06-26 DIAGNOSIS — M25641 Stiffness of right hand, not elsewhere classified: Secondary | ICD-10-CM | POA: Insufficient documentation

## 2022-06-26 DIAGNOSIS — M6281 Muscle weakness (generalized): Secondary | ICD-10-CM | POA: Insufficient documentation

## 2022-06-26 DIAGNOSIS — M25611 Stiffness of right shoulder, not elsewhere classified: Secondary | ICD-10-CM | POA: Insufficient documentation

## 2022-06-26 DIAGNOSIS — R278 Other lack of coordination: Secondary | ICD-10-CM | POA: Insufficient documentation

## 2022-06-26 DIAGNOSIS — M25621 Stiffness of right elbow, not elsewhere classified: Secondary | ICD-10-CM | POA: Diagnosis present

## 2022-06-26 DIAGNOSIS — R5381 Other malaise: Secondary | ICD-10-CM | POA: Diagnosis present

## 2022-06-26 DIAGNOSIS — M25612 Stiffness of left shoulder, not elsewhere classified: Secondary | ICD-10-CM | POA: Insufficient documentation

## 2022-06-26 DIAGNOSIS — G8252 Quadriplegia, C1-C4 incomplete: Secondary | ICD-10-CM | POA: Insufficient documentation
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
## 2022-06-26 NOTE — Progress Notes (Addendum)
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 06/25/2022 2:52:18 PM Version By: Kalman Shan DO Entered By: Lonell Face on 07/15/2022 10:29:29

## 2022-06-29 NOTE — Progress Notes (Signed)
[name], [name] ([id number]) [id number] Page 1 of 7 Visit Report for 05/24/2022 Arrival Information Details Patient Name: Date of Service: ESI, [name] [name] 05/24/2022 10:30 A M Medical Record Number: [id number] Patient Account Number: [id number] Date of Birth/Sex: Treating RN: [birth year] (77 y.o. F) Primary Care [name]: [name] Other Clinician: Referring [name]: Treating [name]/Extender: [name] in Treatment: 24 Visit Information History Since Last Visit Added or deleted any medications: No Patient Arrived: Wheel Chair Any new allergies or adverse reactions: No Arrival Time: 10:56 Had a fall or experienced change in No Accompanied By: caregiver activities of daily living that may affect Transfer Assistance: [name] Lift risk of falls: Patient Identification Verified: Yes Signs or symptoms of abuse/neglect since last visito No Secondary Verification Process Completed: Yes Hospitalized since last visit: No Patient Requires Transmission-Based Precautions: No Implantable device outside of the clinic excluding No Patient Has Alerts: No cellular tissue based products placed in the center since last visit: Has Dressing in Place as Prescribed: Yes Pain Present Now: Yes Electronic Signature(s) Signed: 05/24/2022 12:35:45 PM By: [name] Entered By: [name] on 05/24/2022 10:58:04 -------------------------------------------------------------------------------- Encounter Discharge Information Details Patient Name: Date of Service: [name] 05/24/2022 10:30 A M Medical Record Number: [id number] Patient Account Number: [id number] Date of Birth/Sex: Treating RN: [birth year] (77 y.o. [name], [name] Primary Care Keatyn Jawad: [name] Other Clinician: Referring Rainbow Salman: Treating [name]/Extender: [name] in Treatment: 24 Encounter Discharge  Information Items Post Procedure Vitals Discharge Condition: Stable Temperature (F): 98.7 Ambulatory Status: Wheelchair Pulse (bpm): 74 Discharge Destination: Home Respiratory Rate (breaths/min): 17 Transportation: Private Auto Blood Pressure (mmHg): 120/80 Accompanied By: [name] Schedule Follow-up Appointment: Yes Clinical Summary of Care: Patient Declined Electronic Signature(s) Signed: 06/27/2022 4:33:55 PM By: Erin Hammock RN Entered By: [name] on 05/24/2022 11:41:28 -------------------------------------------------------------------------------- Lower Extremity Assessment Details Patient Name: Date of Service: [name] 05/24/2022 10:30 A M Medical Record Number: [id number] Patient Account Number: [id number] Date of Birth/Sex: Treating RN: [birth year] (77 y.o. F) Primary Care Isatou Agredano: [name] Other Clinician: Referring [name]: Treating [name]/Extender: [name] in Treatment: 24 Electronic Signature(s) Signed: 05/24/2022 12:35:45 PM By: [name], [name] ([id number]) [id number] Page 2 of 7 Entered By: [name] on 05/24/2022 10:58:55 -------------------------------------------------------------------------------- Multi Wound Chart Details Patient Name: Date of Service: [name], [name] [name] 05/24/2022 10:30 A M Medical Record Number: [id number] Patient Account Number: [id number] Date of Birth/Sex: Treating RN: 26-Jul-1945 (77 y.o. F) Primary Care [name]: [name] Other Clinician: Referring Cord [name]: Treating [name]/Extender: [name] in Treatment: 24 Vital Signs Height(in): 36 Pulse(bpm): 61 Weight(lbs): 110 Blood Pressure(mmHg): 102/60 Body Mass Index(BMI): 18.9 Temperature(F): 99 Respiratory Rate(breaths/min): 18 [1:Photos:] [N/A:N/A] Sacrum Left Calcaneus N/A Wound Location: Pressure Injury Pressure Injury  N/A Wounding Event: Pressure Ulcer Pressure Ulcer N/A Primary Etiology: Cataracts, Chronic sinus Cataracts, Chronic sinus N/A Comorbid History: problems/congestion, Middle ear problems/congestion, Middle ear problems, Anemia, Asthma, problems, Anemia, Asthma, Osteoarthritis, Quadriplegia Osteoarthritis, Quadriplegia 11/02/2021 04/01/2022 N/A Date Acquired: 24 7 N/A Weeks of Treatment: Open Open N/A Wound Status: No No N/A Wound Recurrence: Yes No N/A Clustered Wound: 1 N/A N/A Clustered Quantity: 0.1x0.1x0.1 0.7x1.2x0.1 N/A Measurements L x W x D (cm) 0.008 0.66 N/A A (cm) : rea 0.001 0.066 N/A Volume (cm) : 99.90% 88.00% N/A % Reduction in A rea: 99.90% 94.00% N/A % Reduction in Volume: Category/Stage III Category/Stage III N/A Classification: Medium Medium N/A Exudate A mount: Serosanguineous Serosanguineous N/A Exudate Type: red,  brown red, brown N/A Exudate Color: Distinct, outline attached Distinct, outline attached N/A Wound Margin: None Present (0%) Large (67-100%) N/A Granulation A mount: N/A Red, Pink N/A Granulation Quality: None Present (0%) None Present (0%) N/A Necrotic A mount: Fascia: No Fat Layer (Subcutaneous Tissue): Yes N/A Exposed Structures: Fat Layer (Subcutaneous Tissue): No Fascia: No Tendon: No Tendon: No Muscle: No Muscle: No Joint: No Joint: No Bone: No Bone: No Large (67-100%) None N/A Epithelialization: Excoriation: No Excoriation: No N/A Periwound Skin Texture: Induration: No Induration: No Callus: No Callus: No Crepitus: No Crepitus: No Rash: No Rash: No Scarring: No Scarring: No Maceration: No Maceration: No N/A Periwound Skin Moisture: Dry/Scaly: No Dry/Scaly: No Atrophie Blanche: No Atrophie Blanche: No N/A Periwound Skin Color: Cyanosis: No Cyanosis: No Ecchymosis: No Ecchymosis: No Erythema: No Erythema: No Hemosiderin Staining: No Hemosiderin Staining: No Mottled: No Mottled: No Pallor:  No Pallor: No Rubor: No Rubor: No N/A No Abnormality N/A TemperatureOtella [name], [name] ([id number]) [phone].pdf Page 3 of 7 Treatment Notes Electronic Signature(s) Signed: 05/24/2022 11:56:15 AM By: Kalman Shan DO Entered By: Kalman Shan on 05/24/2022 11:15:48 -------------------------------------------------------------------------------- Multi-Disciplinary Care Plan Details Patient Name: Date of Service: [name] 05/24/2022 10:30 A M Medical Record Number: [id number] Patient Account Number: [id number] Date of Birth/Sex: Treating RN: Sep 16, 1945 (77 y.o. [name], [name] Primary Care Lerry Cordrey: [name] Other Clinician: Referring Haja Crego: Treating Soren Pigman/Extender: [name] in Treatment: 24 Active Inactive Nutrition Nursing Diagnoses: Potential for alteratiion in Nutrition/Potential for imbalanced nutrition Goals: Patient/caregiver agrees to and verbalizes understanding of need to obtain nutritional consultation Date Initiated: 12/03/2021 Target Resolution Date: 05/25/2022 Goal Status: Active Interventions: Assess patient nutrition upon admission and as needed per policy Provide education on nutrition Treatment Activities: Education provided on Nutrition : 04/01/2022 Patient referred to Primary Care Physician for further nutritional evaluation : 12/03/2021 Notes: Pain, Acute or Chronic Nursing Diagnoses: Pain, acute or chronic: actual or potential Potential alteration in comfort, pain Goals: Patient will verbalize adequate pain control and receive pain control interventions during procedures as needed Date Initiated: 12/03/2021 Target Resolution Date: 05/25/2022 Goal Status: Active Patient/caregiver will verbalize comfort level met Date Initiated: 12/03/2021 Target Resolution Date: 05/25/2022 Goal Status: Active Interventions: Encourage patient to take pain medications as  prescribed Provide education on pain management Reposition patient for comfort Treatment Activities: Administer pain control measures as ordered : 12/03/2021 Notes: Pressure Nursing Diagnoses: Knowledge deficit related to management of pressures ulcers Potential for impaired tissue integrity related to pressure, friction, moisture, and shear Goals: Patient will remain free from development of additional pressure ulcers [name], [name] ([id number]) [phone].pdf Page 4 of 7 Date Initiated: 12/03/2021 Target Resolution Date: 05/25/2022 Goal Status: Active Interventions: Assess: immobility, friction, shearing, incontinence upon admission and as needed Assess offloading mechanisms upon admission and as needed Provide education on pressure ulcers Treatment Activities: Patient referred for seating evaluation to ensure proper offloading : 12/03/2021 T ordered outside of clinic : 12/03/2021 est Notes: Electronic Signature(s) Signed: 06/27/2022 4:33:55 PM By: Erin Hammock RN Entered By: [name] on 05/24/2022 11:36:49 -------------------------------------------------------------------------------- Pain Assessment Details Patient Name: Date of Service: [name] 05/24/2022 10:30 A M Medical Record Number: [id number] Patient Account Number: [id number] Date of Birth/Sex: Treating RN: 08-May-1945 (77 y.o. F) Primary Care Vergia Chea: [name] Other Clinician: Referring Chick Cousins: Treating Aviendha Azbell/Extender: [name] in Treatment: 24 Active Problems Location of Pain Severity and Description of Pain Patient Has Paino Yes Site Locations Pain Location: Generalized Pain, Pain in Ulcers  Rate the pain. Current Pain Level: 5 Pain Management and Medication Current Pain Management: Electronic Signature(s) Signed: 05/24/2022 12:35:45 PM By: [name] Entered By: [name] on 05/24/2022  10:58:41 -------------------------------------------------------------------------------- Patient/Caregiver Education Details Patient Name: Date of Service: [name] 2/2/2024andnbsp10:30 A M Medical Record Number: [id number] Patient Account Number: [id number] Date of Birth/Gender: Treating RN: [birth year] (77 y.o. [name], [name] Primary Care Physician: [name] Other Clinician: Referring Physician: Treating Physician/Extender: [name] in Treatment: [address] [place], [name] ([id number]) [id number] Page 5 of 7 Education Provided To: Patient Education Topics Provided Wound/Skin Impairment: Methods: Explain/Verbal Responses: Reinforcements needed, State content correctly Electronic Signature(s) Signed: 06/27/2022 4:33:55 PM By: Erin Hammock RN Entered By: [name] on 05/24/2022 11:39:54 -------------------------------------------------------------------------------- Wound Assessment Details Patient Name: Date of Service: [name] 05/24/2022 10:30 A M Medical Record Number: [id number] Patient Account Number: [id number] Date of Birth/Sex: Treating RN: 1946/02/15 (77 y.o. [name], [name] Primary Care Arsenia Goracke: [name] Other Clinician: Referring Carlyann Placide: Treating Kepler Mccabe/Extender: [name] in Treatment: 24 Wound Status Wound Number: 1 Primary Pressure Ulcer Etiology: Wound Location: Sacrum Wound Healed - Epithelialized Wounding Event: Pressure Injury Status: Date Acquired: 11/02/2021 Comorbid Cataracts, Chronic sinus problems/congestion, Middle ear Weeks Of Treatment: 24 History: problems, Anemia, Asthma, Osteoarthritis, Quadriplegia Clustered Wound: Yes Photos Wound Measurements Length: (cm) Width: (cm) Depth: (cm) Clustered Quantity: Area: (cm) Volume: (cm) 0 % Reduction in Area: 100% 0 % Reduction  in Volume: 100% 0 Epithelialization: Large (67-100%) 1 Tunneling: No 0 Undermining: No 0 Wound Description Classification: Category/Stage III Wound Margin: Distinct, outline attached Exudate Amount: Medium Exudate Type: Serosanguineous Exudate Color: red, brown Foul Odor After Cleansing: No Slough/Fibrino Yes Wound Bed Granulation Amount: None Present (0%) Exposed Structure Necrotic Amount: None Present (0%) Fascia Exposed: No Fat Layer (Subcutaneous Tissue) Exposed: No Tendon Exposed: No Muscle Exposed: No Joint Exposed: No Bone Exposed: No [name], [name] ([id number] Page 6 of 7 Periwound Skin Texture Texture Color No Abnormalities Noted: No No Abnormalities Noted: No Callus: No Atrophie Blanche: No Crepitus: No Cyanosis: No Excoriation: No Ecchymosis: No Induration: No Erythema: No Rash: No Hemosiderin Staining: No Scarring: No Mottled: No Pallor: No Moisture Rubor: No No Abnormalities Noted: No Dry / Scaly: No Maceration: No Treatment Notes Wound #1 (Sacrum) Cleanser Peri-Wound Care Topical Primary Dressing Secondary Dressing Secured With Compression Wrap Compression Stockings Add-Ons Electronic Signature(s) Signed: 06/27/2022 4:33:55 PM By: Erin Hammock RN Entered By: [name] on 05/24/2022 11:25:44 -------------------------------------------------------------------------------- Wound Assessment Details Patient Name: Date of Service: [name] 05/24/2022 10:30 A M Medical Record Number: [id number] Patient Account Number: [id number] Date of Birth/Sex: Treating RN: 07/27/1945 (78 y.o. F) Primary Care Breah Joa: [name] Other Clinician: Referring Erma Joubert: Treating Caleel Kiner/Extender: [name] in Treatment: 24 Wound Status Wound Number: 6 Primary Pressure Ulcer Etiology: Wound Location: Left Calcaneus Wound Open Wounding Event: Pressure  Injury Status: Date Acquired: 04/01/2022 Comorbid Cataracts, Chronic sinus problems/congestion, Middle ear Weeks Of Treatment: 7 History: problems, Anemia, Asthma, Osteoarthritis, Quadriplegia Clustered Wound: No Photos Wound Measurements Length: (cm) 0. [name], [name] ([id number]) Width: (cm) 1. Depth: (cm) 0. Area: (cm) 0 Volume: (cm) 0 7 % Reduction in Area: 88% [phone].pdf Page 7 of 7 2 % Reduction in Volume: 94% 1 Epithelialization: None .66 Tunneling: No .066 Undermining: No Wound Description Classification: Category/Stage III Wound Margin: Distinct, outline attached Exudate Amount: Medium Exudate Type: Serosanguineous Exudate Color: red, brown Foul Odor After Cleansing: No Slough/Fibrino Yes Wound  Bed Granulation Amount: Large (67-100%) Exposed Structure Granulation Quality: Red, Pink Fascia Exposed: No Necrotic Amount: None Present (0%) Fat Layer (Subcutaneous Tissue) Exposed: Yes Tendon Exposed: No Muscle Exposed: No Joint Exposed: No Bone Exposed: No Periwound Skin Texture Texture Color No Abnormalities Noted: No No Abnormalities Noted: No Callus: No Atrophie Blanche: No Crepitus: No Cyanosis: No Excoriation: No Ecchymosis: No Induration: No Erythema: No Rash: No Hemosiderin Staining: No Scarring: No Mottled: No Pallor: No Moisture Rubor: No No Abnormalities Noted: No Dry / Scaly: No Temperature / Pain Maceration: No Temperature: No Abnormality Electronic Signature(s) Signed: 05/24/2022 12:35:45 PM By: [name] Entered By: [name] on 05/24/2022 11:15:05 -------------------------------------------------------------------------------- Vitals Details Patient Name: Date of Service: [name] 05/24/2022 10:30 A M Medical Record Number: [id number] Patient Account Number: [id number] Date of Birth/Sex: Treating RN: 1945-11-09 (77 y.o. F) Primary Care Mekhia Brogan: [name] Other  Clinician: Referring Travaris Kosh: Treating Joziyah Roblero/Extender: [name] in Treatment: 24 Vital Signs Time Taken: 10:58 Temperature (F): 99 Height (in): 64 Pulse (bpm): 56 Weight (lbs): 110 Respiratory Rate (breaths/min): 18 Body Mass Index (BMI): 18.9 Blood Pressure (mmHg): 102/60 Reference Range: 80 - 120 mg / dl Electronic Signature(s) Signed: 05/24/2022 12:35:45 PM By: [name] Entered By: [name] on 05/24/2022 10:58:24
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
## 2022-06-29 NOTE — Progress Notes (Signed)
ARALI, OREGEL ([id number]) [id number] Page 1 of 7 Visit Report for 06/25/2022 Arrival Information Details Patient Name: Date of Service: [name], [name] RLO TTE 06/25/2022 2:00 PM Medical Record Number: [id number] Patient Account Number: [id number] Date of Birth/Sex: Treating RN: 12-Mar-[name] (77 y.o. F) Primary Care Gian Ybarra: Fonnie Jarvis Other Clinician: Referring Nima Bamburg: Treating Brookley Spitler/Extender: Dorann Ou in Treatment: 29 Visit Information History Since Last Visit Added or deleted any medications: No Patient Arrived: Wheel Chair Any new allergies or adverse reactions: No Arrival Time: 14:04 Had a fall or experienced change in No Accompanied By: self activities of daily living that may affect Transfer Assistance: Harrel Lemon Lift risk of falls: Patient Identification Verified: Yes Signs or symptoms of abuse/neglect since last visito No Secondary Verification Process Completed: Yes Hospitalized since last visit: Yes Patient Requires Transmission-Based Precautions: No Implantable device outside of the clinic excluding No Patient Has Alerts: No cellular tissue based products placed in the center since last visit: Has Dressing in Place as Prescribed: Yes Pain Present Now: Yes Electronic Signature(s) Signed: 06/25/2022 3:59:08 PM By: Erenest Blank Entered By: Erenest Blank on 06/25/2022 14:05:35 -------------------------------------------------------------------------------- Clinic Level of Care Assessment Details Patient Name: Date of Service: [name], [name] RLO TTE 06/25/2022 2:00 PM Medical Record Number: [id number] Patient Account Number: [id number] Date of Birth/Sex: Treating RN: 28-May-[name] (77 y.o. [name], [name] Primary Care Destyni Hoppel: Fonnie Jarvis Other Clinician: Referring Keegan Bensch: Treating Jamiracle Avants/Extender: Dorann Ou in Treatment: 29 Clinic Level of Care Assessment  Items TOOL 4 Quantity Score X- 1 0 Use when only an EandM is performed on FOLLOW-UP visit ASSESSMENTS - Nursing Assessment / Reassessment X- 1 10 Reassessment of Co-morbidities (includes updates in patient status) X- 1 5 Reassessment of Adherence to Treatment Plan ASSESSMENTS - Wound and Skin A ssessment / Reassessment X - Simple Wound Assessment / Reassessment - one wound 1 5 '[]'$  - 0 Complex Wound Assessment / Reassessment - multiple wounds '[]'$  - 0 Dermatologic / Skin Assessment (not related to wound area) ASSESSMENTS - Focused Assessment X- 1 5 Circumferential Edema Measurements - multi extremities '[]'$  - 0 Nutritional Assessment / Counseling / Intervention '[]'$  - 0 Lower Extremity Assessment (monofilament, tuning fork, pulses) '[]'$  - 0 Peripheral Arterial Disease Assessment (using hand held doppler) ASSESSMENTS - Ostomy and/or Continence Assessment and Care '[]'$  - 0 Incontinence Assessment and Management '[]'$  - 0 Ostomy Care Assessment and Management (repouching, etc.) PROCESS - Coordination of Care X - Simple Patient / Family Education for ongoing care 1 15 [name], [name] ([id number]) [id number] Page 2 of 7 '[]'$  - 0 Complex (extensive) Patient / Family Education for ongoing care X- 1 10 Staff obtains Consents, Records, T Results / Process Orders est X- 1 10 Staff telephones HHA, Nursing Homes / Clarify orders / etc '[]'$  - 0 Routine Transfer to another Facility (non-emergent condition) '[]'$  - 0 Routine Hospital Admission (non-emergent condition) '[]'$  - 0 New Admissions / Biomedical engineer / Ordering NPWT Apligraf, etc. , '[]'$  - 0 Emergency Hospital Admission (emergent condition) X- 1 10 Simple Discharge Coordination '[]'$  - 0 Complex (extensive) Discharge Coordination PROCESS - Special Needs '[]'$  - 0 Pediatric / Minor Patient Management '[]'$  - 0 Isolation Patient Management '[]'$  - 0 Hearing / Language / Visual special needs '[]'$  - 0 Assessment of  Community assistance (transportation, D/C planning, etc.) '[]'$  - 0 Additional assistance / Altered mentation '[]'$  - 0 Support Surface(s) Assessment (bed, cushion, seat, etc.) INTERVENTIONS - Wound Cleansing / Measurement X - Simple Wound Cleansing - one wound 1  5 '[]'$  - 0 Complex Wound Cleansing - multiple wounds X- 1 5 Wound Imaging (photographs - any number of wounds) '[]'$  - 0 Wound Tracing (instead of photographs) X- 1 5 Simple Wound Measurement - one wound '[]'$  - 0 Complex Wound Measurement - multiple wounds INTERVENTIONS - Wound Dressings X - Small Wound Dressing one or multiple wounds 1 10 '[]'$  - 0 Medium Wound Dressing one or multiple wounds '[]'$  - 0 Large Wound Dressing one or multiple wounds X- 1 5 Application of Medications - topical '[]'$  - 0 Application of Medications - injection INTERVENTIONS - Miscellaneous '[]'$  - 0 External ear exam '[]'$  - 0 Specimen Collection (cultures, biopsies, blood, body fluids, etc.) '[]'$  - 0 Specimen(s) / Culture(s) sent or taken to Lab for analysis X- 1 10 Patient Transfer (multiple staff / Civil Service fast streamer / Similar devices) '[]'$  - 0 Simple Staple / Suture removal (25 or less) '[]'$  - 0 Complex Staple / Suture removal (26 or more) '[]'$  - 0 Hypo / Hyperglycemic Management (close monitor of Blood Glucose) '[]'$  - 0 Ankle / Brachial Index (ABI) - do not check if billed separately X- 1 5 Vital Signs Has the patient been seen at the hospital within the last three years: Yes Total Score: 115 Level Of Care: New/Established - Level 3 Electronic Signature(s) Signed: 06/27/2022 4:33:02 PM By: Rhae Hammock RN Entered By: Rhae Hammock on 06/25/2022 14:48:20 [name] ([id number] Page 3 of 7 -------------------------------------------------------------------------------- Encounter Discharge Information Details Patient Name: Date of Service: [name], [name] RLO TTE 06/25/2022 2:00 PM Medical Record Number:  [id number] Patient Account Number: [id number] Date of Birth/Sex: Treating RN: [birth year]-[name] (77 y.o. [name], [name] Primary Care Rida Loudin: Fonnie Jarvis Other Clinician: Referring Jagdeep Ancheta: Treating Madelina Sanda/Extender: Dorann Ou in Treatment: 29 Encounter Discharge Information Items Discharge Condition: Stable Ambulatory Status: Wheelchair Discharge Destination: Home Transportation: Private Auto Accompanied By: caregiver/aide Schedule Follow-up Appointment: Yes Clinical Summary of Care: Patient Declined Electronic Signature(s) Signed: 06/27/2022 4:33:02 PM By: Rhae Hammock RN Entered By: Rhae Hammock on 06/25/2022 14:49:03 -------------------------------------------------------------------------------- Lower Extremity Assessment Details Patient Name: Date of Service: [name] RLO TTE 06/25/2022 2:00 PM Medical Record Number: [id number] Patient Account Number: [id number] Date of Birth/Sex: Treating RN: [name][birth year] (77 y.o. F) Primary Care Sosha Shepherd: Fonnie Jarvis Other Clinician: Referring Makiah Foye: Treating Jobina Maita/Extender: Dorann Ou in Treatment: 29 Edema Assessment Assessed: [Left: No] [Right: No] Edema: [Left: N] [Right: o] Calf Left: Right: Point of Measurement: From Medial Instep 28.2 cm Ankle Left: Right: Point of Measurement: From Medial Instep 19.1 cm Electronic Signature(s) Signed: 06/25/2022 3:59:08 PM By: Erenest Blank Entered By: Erenest Blank on 06/25/2022 14:18:48 -------------------------------------------------------------------------------- Multi Wound Chart Details Patient Name: Date of Service: [name] RLO TTE 06/25/2022 2:00 PM Medical Record Number: [id number] Patient Account Number: [id number] Date of Birth/Sex: Treating RN: [name], [name] (77 y.o. F) Primary Care Fatih Stalvey: Fonnie Jarvis Other Clinician: Referring Skylen Spiering: Treating Rahcel Shutes/Extender: Dorann Ou in Treatment: 29 Vital Signs Height(in): 64 Pulse(bpm): 65 Weight(lbs): 110 Blood Pressure(mmHg): 103/68 Body Mass Index(BMI): 18.9 Temperature(F): 98.4 [name], [name] ([id number]) [id number] Page 4 of 7 Respiratory Rate(breaths/min): 18 [6:Photos:] [N/A:N/A] Left Calcaneus N/A N/A Wound Location: Pressure Injury N/A N/A Wounding Event: Pressure Ulcer N/A N/A Primary Etiology: Cataracts, Chronic sinus N/A N/A Comorbid History: problems/congestion, Middle ear problems, Anemia, Asthma, Osteoarthritis, Quadriplegia 04/01/2022 N/A N/A Date Acquired: 12 N/A N/A Weeks of Treatment: Healed - Epithelialized N/A N/A Wound Status: No N/A N/A Wound Recurrence: 0x0x0 N/A N/A Measurements L x W x  D (cm) 0 N/A N/A A (cm) : rea 0 N/A N/A Volume (cm) : 100.00% N/A N/A % Reduction in A rea: 100.00% N/A N/A % Reduction in Volume: Category/Stage III N/A N/A Classification: Medium N/A N/A Exudate A mount: Serosanguineous N/A N/A Exudate Type: red, brown N/A N/A Exudate Color: Distinct, outline attached N/A N/A Wound Margin: Large (67-100%) N/A N/A Granulation A mount: Red, Pink N/A N/A Granulation Quality: None Present (0%) N/A N/A Necrotic A mount: Fat Layer (Subcutaneous Tissue): Yes N/A N/A Exposed Structures: Fascia: No Tendon: No Muscle: No Joint: No Bone: No Small (1-33%) N/A N/A Epithelialization: Excoriation: No N/A N/A Periwound Skin Texture: Induration: No Callus: No Crepitus: No Rash: No Scarring: No Maceration: No N/A N/A Periwound Skin Moisture: Dry/Scaly: No Atrophie Blanche: No N/A N/A Periwound Skin Color: Cyanosis: No Ecchymosis: No Erythema: No Hemosiderin Staining: No Mottled: No Pallor: No Rubor: No No Abnormality N/A N/A Temperature: Treatment Notes Electronic Signature(s) Signed: 06/25/2022 2:52:18 PM By: Kalman Shan DO Entered By: Kalman Shan on  06/25/2022 14:38:02 -------------------------------------------------------------------------------- Multi-Disciplinary Care Plan Details Patient Name: Date of Service: [name] RLO TTE 06/25/2022 2:00 PM Medical Record Number: [id number] Patient Account Number: [id number] Date of Birth/Sex: Treating RN: [birth year][name] (77 y.o. [name], [name] Primary Care Markeise Mathews: Fonnie Jarvis Other Clinician: Referring Izaya Netherton: Treating Torez Beauregard/Extender: Dorann Ou in Treatment: [address] [place] ([id number]) [id number] Page 5 of 7 Active Inactive Electronic Signature(s) Signed: 06/27/2022 4:33:02 PM By: Rhae Hammock RN Entered By: Rhae Hammock on 06/25/2022 14:33:10 -------------------------------------------------------------------------------- Pain Assessment Details Patient Name: Date of Service: [name] RLO TTE 06/25/2022 2:00 PM Medical Record Number: [id number] Patient Account Number: [id number] Date of Birth/Sex: Treating RN: [birth year] [name] (77 y.o. F) Primary Care Slevin Gunby: Fonnie Jarvis Other Clinician: Referring Juandiego Kolenovic: Treating Stephanie Mcglone/Extender: Dorann Ou in Treatment: 29 Active Problems Location of Pain Severity and Description of Pain Patient Has Paino Yes Site Locations Pain Location: Generalized Pain Pain Management and Medication Current Pain Management: Electronic Signature(s) Signed: 06/25/2022 3:59:08 PM By: Erenest Blank Entered By: Erenest Blank on 06/25/2022 14:06:09 -------------------------------------------------------------------------------- Patient/Caregiver Education Details Patient Name: Date of Service: [name] RLO TTE 3/5/2024andnbsp2:00 PM Medical Record Number: [id number] Patient Account Number: [id number] Date of Birth/Gender: Treating RN: [birth year]-[name] (77 y.o. [name], [name] Primary Care Physician: Fonnie Jarvis Other  Clinician: Referring Physician: Treating Physician/Extender: Dorann Ou in Treatment: 56 Education Assessment Education Provided To: Patient Education Topics Provided Wound/Skin Impairment: Methods: Explain/Verbal Responses: Reinforcements needed, State content correctly [employer], [place] ([id number]) [id number] Page 6 of 7 Electronic Signature(s) Signed: 06/27/2022 4:33:02 PM By: Rhae Hammock RN Entered By: Rhae Hammock on 06/25/2022 14:36:24 -------------------------------------------------------------------------------- Wound Assessment Details Patient Name: Date of Service: [name] RLO TTE 06/25/2022 2:00 PM Medical Record Number: [id number] Patient Account Number: [id number] Date of Birth/Sex: Treating RN: 12/[birth year]/[name] (77 y.o. [name], [name] Primary Care Ivori Storr: Fonnie Jarvis Other Clinician: Referring Peggy Monk: Treating Satoya Feeley/Extender: Dorann Ou in Treatment: 29 Wound Status Wound Number: 6 Primary Pressure Ulcer Etiology: Wound Location: Left Calcaneus Wound Healed - Epithelialized Wounding Event: Pressure Injury Status: Date Acquired: 04/01/2022 Comorbid Cataracts, Chronic sinus problems/congestion, Middle ear Weeks Of Treatment: 12 History: problems, Anemia, Asthma, Osteoarthritis, Quadriplegia Clustered Wound: No Photos Wound Measurements Length: (cm) Width: (cm) Depth: (cm) Area: (cm) Volume: (cm) 0 % Reduction in Area: 100% 0 % Reduction in Volume: 100% 0 Epithelialization: Small (1-33%) 0 Tunneling: No 0 Undermining: No Wound Description Classification: Category/Stage III Wound Margin: Distinct, outline attached Exudate Amount: Medium  Exudate Type: Serosanguineous Exudate Color: red, brown Foul Odor After Cleansing: No Slough/Fibrino No Wound Bed Granulation Amount: Large (67-100%) Exposed Structure Granulation Quality: Red,  Pink Fascia Exposed: No Necrotic Amount: None Present (0%) Fat Layer (Subcutaneous Tissue) Exposed: Yes Tendon Exposed: No Muscle Exposed: No Joint Exposed: No Bone Exposed: No Periwound Skin Texture Texture Color No Abnormalities Noted: No No Abnormalities Noted: No Callus: No Atrophie Blanche: No Crepitus: No Cyanosis: No Excoriation: No Ecchymosis: No Induration: No Erythema: No Rash: No Hemosiderin Staining: No Scarring: No Mottled: No Pallor: No [name], [name] ([id number].pdf Page 7 of 7 Pallor: No Moisture Rubor: No No Abnormalities Noted: No Dry / Scaly: No Temperature / Pain Maceration: No Temperature: No Abnormality Treatment Notes Wound #6 (Calcaneus) Wound Laterality: Left Cleanser Peri-Wound Care Topical Primary Dressing Secondary Dressing Secured With Compression Wrap Compression Stockings Add-Ons Electronic Signature(s) Signed: 06/27/2022 4:33:02 PM By: Rhae Hammock RN Entered By: Rhae Hammock on 06/25/2022 14:32:38 -------------------------------------------------------------------------------- Vitals Details Patient Name: Date of Service: [name] RLO TTE 06/25/2022 2:00 PM Medical Record Number: [id number] Patient Account Number: [id number] Date of Birth/Sex: Treating RN: [birth year] (77 y.o. F) Primary Care Jannifer Fischler: Fonnie Jarvis Other Clinician: Referring Prakriti Carignan: Treating Billyjoe Go/Extender: Dorann Ou in Treatment: 29 Vital Signs Time Taken: 14:05 Temperature (F): 98.4 Height (in): 64 Pulse (bpm): 65 Weight (lbs): 110 Respiratory Rate (breaths/min): 18 Body Mass Index (BMI): 18.9 Blood Pressure (mmHg): 103/68 Reference Range: 80 - 120 mg / dl Electronic Signature(s) Signed: 06/25/2022 3:59:08 PM By: Erenest Blank Entered By: Erenest Blank on 06/25/2022 14:06:00

## 2022-07-03 ENCOUNTER — Encounter: Payer: Self-pay | Admitting: Occupational Therapy

## 2022-07-03 ENCOUNTER — Ambulatory Visit: Payer: PPO | Admitting: Occupational Therapy

## 2022-07-03 DIAGNOSIS — M25612 Stiffness of left shoulder, not elsewhere classified: Secondary | ICD-10-CM

## 2022-07-03 DIAGNOSIS — G8252 Quadriplegia, C1-C4 incomplete: Secondary | ICD-10-CM

## 2022-07-03 DIAGNOSIS — R278 Other lack of coordination: Secondary | ICD-10-CM

## 2022-07-03 DIAGNOSIS — M25611 Stiffness of right shoulder, not elsewhere classified: Secondary | ICD-10-CM

## 2022-07-03 DIAGNOSIS — M25641 Stiffness of right hand, not elsewhere classified: Secondary | ICD-10-CM

## 2022-07-03 DIAGNOSIS — M6281 Muscle weakness (generalized): Secondary | ICD-10-CM | POA: Diagnosis not present

## 2022-07-03 DIAGNOSIS — R5381 Other malaise: Secondary | ICD-10-CM

## 2022-07-03 DIAGNOSIS — M25622 Stiffness of left elbow, not elsewhere classified: Secondary | ICD-10-CM

## 2022-07-03 DIAGNOSIS — G8250 Quadriplegia, unspecified: Secondary | ICD-10-CM

## 2022-07-03 DIAGNOSIS — M25642 Stiffness of left hand, not elsewhere classified: Secondary | ICD-10-CM

## 2022-07-03 DIAGNOSIS — M25621 Stiffness of right elbow, not elsewhere classified: Secondary | ICD-10-CM
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
## 2022-07-08 ENCOUNTER — Ambulatory Visit: Payer: PPO | Admitting: Physical Therapy

## 2022-07-08 DIAGNOSIS — G8250 Quadriplegia, unspecified: Secondary | ICD-10-CM

## 2022-07-08 DIAGNOSIS — R5381 Other malaise: Secondary | ICD-10-CM

## 2022-07-08 DIAGNOSIS — G8252 Quadriplegia, C1-C4 incomplete: Secondary | ICD-10-CM

## 2022-07-08 DIAGNOSIS — M6281 Muscle weakness (generalized): Secondary | ICD-10-CM

## 2022-07-08 NOTE — Therapy (Signed)
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  required for upgraded joystick      (lite-throw, heavy duty, micro)  []  Allows user to see in which mode and drive the wheelchair is set; necessary for alternate controls       [x]  Upgraded tracking electronics  []  correct tracking when on uneven surfaces makes switch driving more efficient and less fatiguing  []  increase safety when driving  []  increase ability to traverse thresholds    [x]  Safety / reset / mode switches     Type:    []  Used to change modes and stop the wheelchair when driving     [x]  Mount for joystick / input device/switches  []  swing away for access or transfers   []  attaches joystick / input device / switches to wheelchair   []  provides for consistent access  []  midline for optimal placement    []  Attendant controlled joystick plus     mount  []  safety  []  long distance driving  []  operation of seat functions  []  compliance with transportation regulations    [x]  Battery  []  required to power (power assist / scooter/ power wc / other):   []  Power inverter (24V to 12V)  []  required for ventilator / respiratory equipment / other:     CHAIR OPTIONS MANUAL & POWER      Armrests   [x]  adjustable height []  removable  []  swing away []  fixed  [x]  flip back  []  reclining  [  x] full length pads []   desk []  tube arms [x]  gel pads  []  provide support with elbow at 90    []  remove/flip back/swing away for  transfers  []  provide support and positioning of upper body    []  allow to come closer to table top  []  remove for access to tables  []  provide support for w/c tray  []  change of height/angles for       variable activities   []  Elbow support / Elbow stop  []  keep elbow positioned on arm pad  []  keep arms from falling off arm pad  during tilt and/or recline   Upper Extremity Support  []  Arm trough  []   R  []   L  Style:  []  swivel mount []  fixed mount   []  posterior hand support  []   tray  [x]  full tray  []  joystick cut out  []   R  []   L  Style:  []  decrease gravitational pull on      shoulders  []  provide support to increase UE  function  []  provide hand support in natural    position  []  position flaccid UE  []  decrease subluxation    []  decrease edema       []  manage spasticity   []  provide midline positioning  []  provide work surface  []  placement for AAC/ Computer/ EADL       Hangers/ Legrests   []  ______ degree  []  Elevating []  articulating  []  swing away []  fixed []  lift off  []  heavy duty []  adjustable knee angle  []  adjustable calf panel   []  longer extension tube              []  provide LE support  []  maintain placement of feet on      footplate   []  accommodate lower leg length  []  accommodate to hamstring       tightness  []  enable transfers  []  provide change in position for LE's  []  elevate legs during recline    []  decrease edema  []  durability      Foot support   [x]  footplate [x]  R [x]  L [x]  flip up           []  Depth adjustable   [x]  angle adjustable  []  foot board/one piece    []  provide foot support  []  accommodate to ankle ROM  []  allow foot to go under wheelchair base  []  enable transfers     []  Shoe holders  []  position foot    []  decrease / manage spasticity  []  control position of LE  []  stability    []  safety     []  Ankle  strap/heel      loops  []  support foot on foot support  []  decrease extraneous movement  []  provide input to heel   []  protect foot     []  Amputee adapter []  R  []  L     Style:                  Size:  []  Provide support for stump/residual extremity    []  Transportation tie-down  []  to provide crash tested tie-down brackets    []  Crutch/cane holder    []  O2 holder    []  IV hanger   []  Ventilator tray/mount    []  stabilize accessory on wheelchair       Component  Justification     [x]  Seat cushion  Roho contour []  accommodate impaired sensation  []  decubitus ulcers present or history  []  unable to shift weight  []  increase pressure distribution  []  prevent pelvic extension  []  custom required "off-the-shelf"    seat cushion will not accommodate deformity  []  stabilize/promote pelvis alignment  []  stabilize/promote femur alignment  []  accommodate obliquity  []  accommodate multiple deformity  []  incontinent/accidents  []  low maintenance     []  seat mounts                 []  fixed []  removable  []  attach seat platform/cushion to wheelchair frame    []  Seat wedge    []  provide increased aggressiveness of seat shape to decrease sliding  down in the seat  []  accommodate ROM        []  Cover replacement   []  protect back or seat cushion  []  incontinent/accidents    []  Solid seat / insert    []  support cushion to prevent      hammocking  []  allows attachment of cushion to mobility base    [x]  Lateral pelvic/thigh/hip     support (Guides)     []  decrease abduction  []  accommodate pelvis  []  position upper legs  []  accommodate spasticity  []  removable for transfers     [x]  Lateral pelvic/thigh      supports mounts  []  fixed   []  swing-away   [x]  removable  []  mounts lateral pelvic/thigh supports     []  mounts lateral pelvic/thigh supports swing-away or removable for transfers    [x]  Medial thigh support ([place])  [] decrease adduction  [] accommodate ROM  []  remove for transfers    []  alignment      [x]  Medial thigh   []  fixed      support mounts      []  swing-away   [x]  removable  []  mounts medial thigh supports   []  Mounts medial supports swing- away or removable for transfers       Component  Justification   [x]  Back    Corpus 3G   []  provide posterior trunk support []  facilitate tone  []  provide lumbar/sacral support []  accommodate deformity  []  support trunk in midline   []  custom required "off-the-shelf" back support will not accommodate deformity   []  provide lateral trunk support []  accommodate or decrease tone            []  Back mounts  []  fixed  []  removable  []  attach back rest/cushion to wheelchair frame   [x]  Lateral trunk      supports  [x]  R [x]  L  []  decrease lateral trunk leaning  []  accommodate asymmetry    []  contour for increased contact  []  safety    []  control of tone    [x]  Lateral trunk      supports mounts  []  fixed  [x]  swing-away   []  removable  []  mounts lateral trunk supports     []  Mounts lateral trunk supports swing-away or removable for transfers   [x]  Anterior chest      strap, vest     []  decrease forward movement of shoulder  []  decrease forward movement of trunk  []  safety/stability  []  added abdominal support  []  trunk alignment  []  assistance with shoulder control   []  decrease shoulder elevation    [x]  Headrest      []  provide posterior head support  []  provide posterior neck support  []  provide lateral head support  []   provide anterior head support  []  support during tilt and recline  []  improve feeding     []  improve respiration  []  placement of switches  []  safety    []  accommodate ROM   []  accommodate tone  []  improve visual orientation   [x]  Headrest           []  fixed [x]  removable []  flip down      Mounting hardware   []  swing-away laterals/switches  []  mount headrest   []  mounts headrest flip down or  removable for transfers  []  mount headrest swing-away laterals   []  mount switches     []  Neck  Support    []  decrease neck rotation  []  decrease forward neck flexion   Pelvic Positioner    []  std hip belt          [x]  padded hip belt  []  dual pull hip belt  []  four point hip belt  []  stabilize tone  []  decrease falling out of chair  []  prevent excessive extension  []  special pull angle to control      rotation  []  pad for protection over boney   prominence  []  promote comfort    []  Essential needs        bag/pouch   []  medicines []  special food rorthotics []  clothing changes  []  diapers  []  catheter/hygiene []  ostomy supplies   The above equipment has a life- long use expectancy.  Growth and changes in medical and/or functional conditions would be the exceptions.   SUMMARY:  Why mobility device was selected; include why a lower level device is not appropriate:   ASSESSMENT:  CLINICAL IMPRESSION: Patient is a *** y.o. *** who was seen today for physical therapy evaluation and treatment for ***.    OBJECTIVE IMPAIRMENTS {opptimpairments:25111}.   ACTIVITY LIMITATIONS {activitylimitations:27494}  PARTICIPATION LIMITATIONS: {participationrestrictions:25113}  PERSONAL FACTORS {Personal factors:25162} are also affecting patient's functional outcome.   REHAB POTENTIAL: {rehabpotential:25112}  CLINICAL DECISION MAKING: {clinical decision making:25114}  EVALUATION COMPLEXITY: {Evaluation complexity:25115}                                   GOALS: One time visit. No goals established.    PLAN: PT FREQUENCY: one time visit    Excell Seltzer, PT, DPT, CSRS 07/08/2022, 2:21 PM    I concur with the above findings and recommendations of the therapist:  Physician name printed:         Physician's signature:      Date:

## 2022-07-10 ENCOUNTER — Encounter: Payer: Self-pay | Admitting: Occupational Therapy

## 2022-07-10 ENCOUNTER — Ambulatory Visit: Payer: PPO | Admitting: Occupational Therapy

## 2022-07-10 DIAGNOSIS — M6281 Muscle weakness (generalized): Secondary | ICD-10-CM | POA: Diagnosis not present

## 2022-07-10 DIAGNOSIS — G8252 Quadriplegia, C1-C4 incomplete: Secondary | ICD-10-CM

## 2022-07-10 DIAGNOSIS — M25612 Stiffness of left shoulder, not elsewhere classified: Secondary | ICD-10-CM

## 2022-07-10 DIAGNOSIS — M25621 Stiffness of right elbow, not elsewhere classified: Secondary | ICD-10-CM

## 2022-07-10 DIAGNOSIS — M25642 Stiffness of left hand, not elsewhere classified: Secondary | ICD-10-CM

## 2022-07-10 DIAGNOSIS — R278 Other lack of coordination: Secondary | ICD-10-CM

## 2022-07-10 DIAGNOSIS — M25611 Stiffness of right shoulder, not elsewhere classified: Secondary | ICD-10-CM

## 2022-07-10 DIAGNOSIS — M25622 Stiffness of left elbow, not elsewhere classified: Secondary | ICD-10-CM

## 2022-07-10 DIAGNOSIS — R5381 Other malaise: Secondary | ICD-10-CM

## 2022-07-10 DIAGNOSIS — M25641 Stiffness of right hand, not elsewhere classified: Secondary | ICD-10-CM

## 2022-07-10 DIAGNOSIS — G8250 Quadriplegia, unspecified: Secondary | ICD-10-CM
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
## 2022-07-17 ENCOUNTER — Ambulatory Visit: Payer: PPO | Admitting: Occupational Therapy

## 2022-07-17 ENCOUNTER — Encounter: Payer: Self-pay | Admitting: Occupational Therapy

## 2022-07-17 DIAGNOSIS — M25622 Stiffness of left elbow, not elsewhere classified: Secondary | ICD-10-CM

## 2022-07-17 DIAGNOSIS — M6281 Muscle weakness (generalized): Secondary | ICD-10-CM | POA: Diagnosis not present

## 2022-07-17 DIAGNOSIS — M25612 Stiffness of left shoulder, not elsewhere classified: Secondary | ICD-10-CM

## 2022-07-17 DIAGNOSIS — R278 Other lack of coordination: Secondary | ICD-10-CM

## 2022-07-17 DIAGNOSIS — M25611 Stiffness of right shoulder, not elsewhere classified: Secondary | ICD-10-CM

## 2022-07-17 DIAGNOSIS — M25641 Stiffness of right hand, not elsewhere classified: Secondary | ICD-10-CM

## 2022-07-17 DIAGNOSIS — G8252 Quadriplegia, C1-C4 incomplete: Secondary | ICD-10-CM

## 2022-07-17 DIAGNOSIS — M25621 Stiffness of right elbow, not elsewhere classified: Secondary | ICD-10-CM

## 2022-07-17 DIAGNOSIS — G8250 Quadriplegia, unspecified: Secondary | ICD-10-CM

## 2022-07-17 DIAGNOSIS — M25642 Stiffness of left hand, not elsewhere classified: Secondary | ICD-10-CM
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
## 2022-07-24 ENCOUNTER — Telehealth: Payer: Self-pay

## 2022-07-24 ENCOUNTER — Ambulatory Visit: Payer: PPO | Attending: Physician Assistant | Admitting: Occupational Therapy

## 2022-07-24 DIAGNOSIS — G8250 Quadriplegia, unspecified: Secondary | ICD-10-CM | POA: Diagnosis present

## 2022-07-24 DIAGNOSIS — M25622 Stiffness of left elbow, not elsewhere classified: Secondary | ICD-10-CM | POA: Diagnosis present

## 2022-07-24 DIAGNOSIS — R278 Other lack of coordination: Secondary | ICD-10-CM | POA: Diagnosis present

## 2022-07-24 DIAGNOSIS — M25642 Stiffness of left hand, not elsewhere classified: Secondary | ICD-10-CM

## 2022-07-24 DIAGNOSIS — G8252 Quadriplegia, C1-C4 incomplete: Secondary | ICD-10-CM | POA: Diagnosis present

## 2022-07-24 DIAGNOSIS — M25621 Stiffness of right elbow, not elsewhere classified: Secondary | ICD-10-CM | POA: Diagnosis present

## 2022-07-24 DIAGNOSIS — R5381 Other malaise: Secondary | ICD-10-CM | POA: Diagnosis present

## 2022-07-24 DIAGNOSIS — M25611 Stiffness of right shoulder, not elsewhere classified: Secondary | ICD-10-CM | POA: Diagnosis present

## 2022-07-24 DIAGNOSIS — M25612 Stiffness of left shoulder, not elsewhere classified: Secondary | ICD-10-CM | POA: Diagnosis present

## 2022-07-24 DIAGNOSIS — M6281 Muscle weakness (generalized): Secondary | ICD-10-CM | POA: Diagnosis present

## 2022-07-24 DIAGNOSIS — M25641 Stiffness of right hand, not elsewhere classified: Secondary | ICD-10-CM
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
## 2022-07-24 NOTE — Telephone Encounter (Signed)
Referral placed for hand splints to be refitted as requested by Dr. Krista Blue

## 2022-07-25 ENCOUNTER — Telehealth: Payer: Self-pay | Admitting: Neurology

## 2022-07-25 NOTE — Telephone Encounter (Signed)
Referral sent to [facility]  [phone]

## 2022-07-31 ENCOUNTER — Ambulatory Visit (INDEPENDENT_AMBULATORY_CARE_PROVIDER_SITE_OTHER): Payer: PPO | Admitting: Neurology

## 2022-07-31 ENCOUNTER — Ambulatory Visit: Payer: PPO | Admitting: Occupational Therapy

## 2022-07-31 VITALS — BP 90/60

## 2022-07-31 DIAGNOSIS — M25622 Stiffness of left elbow, not elsewhere classified: Secondary | ICD-10-CM

## 2022-07-31 DIAGNOSIS — M25641 Stiffness of right hand, not elsewhere classified: Secondary | ICD-10-CM

## 2022-07-31 DIAGNOSIS — M25642 Stiffness of left hand, not elsewhere classified: Secondary | ICD-10-CM

## 2022-07-31 DIAGNOSIS — R278 Other lack of coordination: Secondary | ICD-10-CM

## 2022-07-31 DIAGNOSIS — M6281 Muscle weakness (generalized): Secondary | ICD-10-CM | POA: Diagnosis not present

## 2022-07-31 DIAGNOSIS — G8250 Quadriplegia, unspecified: Secondary | ICD-10-CM

## 2022-07-31 DIAGNOSIS — M25621 Stiffness of right elbow, not elsewhere classified: Secondary | ICD-10-CM

## 2022-07-31 MED ORDER — INCOBOTULINUMTOXINA 100 UNITS IM SOLR
100.0000 [IU] | Freq: Once | INTRAMUSCULAR | Status: AC
  Administered 2022-07-31: 100 [IU] via INTRAMUSCULAR

## 2022-07-31 NOTE — Progress Notes (Signed)
xeomin 100 units x 1 vials Lot: [id number] Expiration: 06.2026 NDC: 4259-5638-75  G82.5 B/B Witnessed by Cinda Quest, CMA
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
## 2022-08-03 ENCOUNTER — Other Ambulatory Visit: Payer: Self-pay | Admitting: Neurology

## 2022-08-07 ENCOUNTER — Encounter: Payer: Self-pay | Admitting: Occupational Therapy

## 2022-08-07 ENCOUNTER — Ambulatory Visit: Payer: PPO | Admitting: Occupational Therapy

## 2022-08-07 DIAGNOSIS — M25622 Stiffness of left elbow, not elsewhere classified: Secondary | ICD-10-CM

## 2022-08-07 DIAGNOSIS — M25612 Stiffness of left shoulder, not elsewhere classified: Secondary | ICD-10-CM

## 2022-08-07 DIAGNOSIS — M25642 Stiffness of left hand, not elsewhere classified: Secondary | ICD-10-CM

## 2022-08-07 DIAGNOSIS — G8252 Quadriplegia, C1-C4 incomplete: Secondary | ICD-10-CM

## 2022-08-07 DIAGNOSIS — M6281 Muscle weakness (generalized): Secondary | ICD-10-CM

## 2022-08-07 DIAGNOSIS — G8250 Quadriplegia, unspecified: Secondary | ICD-10-CM

## 2022-08-07 DIAGNOSIS — M25621 Stiffness of right elbow, not elsewhere classified: Secondary | ICD-10-CM

## 2022-08-07 DIAGNOSIS — R5381 Other malaise: Secondary | ICD-10-CM

## 2022-08-07 DIAGNOSIS — M25611 Stiffness of right shoulder, not elsewhere classified: Secondary | ICD-10-CM

## 2022-08-07 DIAGNOSIS — M25641 Stiffness of right hand, not elsewhere classified: Secondary | ICD-10-CM

## 2022-08-07 DIAGNOSIS — R278 Other lack of coordination: Secondary | ICD-10-CM
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
## 2022-09-04 ENCOUNTER — Telehealth: Payer: Self-pay | Admitting: Occupational Therapy

## 2022-09-04 NOTE — Telephone Encounter (Signed)
Therapist called and spoke with Tenny Craw from [facility] with respect to a request for resting hand splint clarification. All questions addressed.   Additionally, OT discussed prefabricated elbow splints for night time wear to prevent elbow flexion. Orthotist reports he only received an order for resting hand splints. Agreed upon 2 options for the pt pending additional measurements. Orthotist requesting additional order for elbow splints.   OT staff messaged neurologist to make sure an additional order would be placed for the elbow braces as originally discussed.

## 2022-09-10 ENCOUNTER — Telehealth: Payer: Self-pay

## 2022-09-10 NOTE — Telephone Encounter (Signed)
[facility] orders for elbow braces have been faxed with demographics, insurance card, and last office visit note.

## 2022-11-05 ENCOUNTER — Telehealth: Payer: Self-pay | Admitting: Neurology

## 2022-11-05 NOTE — Telephone Encounter (Signed)
Called pt daughter to confirm appt 11/06/2022. Pt daughter states pt is going to have a botox procedure for her bladder and wants to make sure the xeomin injection will not interfere with this. Would like a call back.

## 2022-11-06 ENCOUNTER — Ambulatory Visit: Payer: PPO | Admitting: Neurology

## 2022-11-06 VITALS — BP 96/68

## 2022-11-06 DIAGNOSIS — G8250 Quadriplegia, unspecified: Secondary | ICD-10-CM

## 2022-11-06 MED ORDER — INCOBOTULINUMTOXINA 100 UNITS IM SOLR: 100.0000 [IU] | INTRAMUSCULAR | Status: DC

## 2022-11-06 MED ORDER — INCOBOTULINUMTOXINA 100 UNITS IM SOLR
100.0000 [IU] | INTRAMUSCULAR | Status: DC
  Administered 2022-11-06: 100 [IU] via INTRAMUSCULAR
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
## 2022-11-06 NOTE — Progress Notes (Signed)
xeomin 100units x 1 vial  [id number] [phone] Exp-09/2024 B/B OR s/p Bacteriostatic 0.9% Sodium Chloride- 2mL  EXB:[id number] Expiration: 02/21/2024 NDC: [id number] Dx: G82.50 WITNESSED [id number] [name]

## 2022-11-13 ENCOUNTER — Ambulatory Visit: Payer: PPO | Attending: Physician Assistant | Admitting: Occupational Therapy

## 2022-11-13 DIAGNOSIS — M25642 Stiffness of left hand, not elsewhere classified: Secondary | ICD-10-CM | POA: Insufficient documentation

## 2022-11-13 DIAGNOSIS — M6281 Muscle weakness (generalized): Secondary | ICD-10-CM | POA: Diagnosis present

## 2022-11-13 DIAGNOSIS — M25641 Stiffness of right hand, not elsewhere classified: Secondary | ICD-10-CM | POA: Diagnosis present

## 2022-11-13 DIAGNOSIS — M25621 Stiffness of right elbow, not elsewhere classified: Secondary | ICD-10-CM | POA: Diagnosis present

## 2022-11-13 DIAGNOSIS — M25612 Stiffness of left shoulder, not elsewhere classified: Secondary | ICD-10-CM | POA: Diagnosis present

## 2022-11-13 DIAGNOSIS — G8250 Quadriplegia, unspecified: Secondary | ICD-10-CM | POA: Insufficient documentation

## 2022-11-13 DIAGNOSIS — M25622 Stiffness of left elbow, not elsewhere classified: Secondary | ICD-10-CM | POA: Insufficient documentation

## 2022-11-13 DIAGNOSIS — R278 Other lack of coordination: Secondary | ICD-10-CM | POA: Insufficient documentation

## 2022-11-13 DIAGNOSIS — M25611 Stiffness of right shoulder, not elsewhere classified: Secondary | ICD-10-CM | POA: Insufficient documentation
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
## 2022-11-20 ENCOUNTER — Ambulatory Visit: Payer: PPO | Admitting: Neurology

## 2022-12-05 ENCOUNTER — Ambulatory Visit: Payer: PPO | Attending: Physician Assistant | Admitting: Occupational Therapy

## 2022-12-05 DIAGNOSIS — R278 Other lack of coordination: Secondary | ICD-10-CM

## 2022-12-05 DIAGNOSIS — M25611 Stiffness of right shoulder, not elsewhere classified: Secondary | ICD-10-CM

## 2022-12-05 DIAGNOSIS — M25612 Stiffness of left shoulder, not elsewhere classified: Secondary | ICD-10-CM | POA: Diagnosis present

## 2022-12-05 DIAGNOSIS — G8252 Quadriplegia, C1-C4 incomplete: Secondary | ICD-10-CM | POA: Diagnosis present

## 2022-12-05 DIAGNOSIS — M25641 Stiffness of right hand, not elsewhere classified: Secondary | ICD-10-CM | POA: Diagnosis present

## 2022-12-05 DIAGNOSIS — M6281 Muscle weakness (generalized): Secondary | ICD-10-CM

## 2022-12-05 DIAGNOSIS — M25621 Stiffness of right elbow, not elsewhere classified: Secondary | ICD-10-CM | POA: Diagnosis present

## 2022-12-05 DIAGNOSIS — G8250 Quadriplegia, unspecified: Secondary | ICD-10-CM | POA: Diagnosis present

## 2022-12-05 DIAGNOSIS — M24541 Contracture, right hand: Secondary | ICD-10-CM | POA: Diagnosis present

## 2022-12-05 DIAGNOSIS — M24542 Contracture, left hand: Secondary | ICD-10-CM | POA: Insufficient documentation

## 2022-12-05 DIAGNOSIS — M25622 Stiffness of left elbow, not elsewhere classified: Secondary | ICD-10-CM

## 2022-12-05 DIAGNOSIS — M25642 Stiffness of left hand, not elsewhere classified: Secondary | ICD-10-CM | POA: Diagnosis present
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
## 2022-12-12 ENCOUNTER — Ambulatory Visit: Payer: PPO | Admitting: Occupational Therapy

## 2022-12-12 DIAGNOSIS — G8250 Quadriplegia, unspecified: Secondary | ICD-10-CM

## 2022-12-12 DIAGNOSIS — M25621 Stiffness of right elbow, not elsewhere classified: Secondary | ICD-10-CM | POA: Diagnosis not present

## 2022-12-12 DIAGNOSIS — M25642 Stiffness of left hand, not elsewhere classified: Secondary | ICD-10-CM

## 2022-12-12 DIAGNOSIS — M25641 Stiffness of right hand, not elsewhere classified: Secondary | ICD-10-CM

## 2022-12-12 DIAGNOSIS — R278 Other lack of coordination: Secondary | ICD-10-CM
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
## 2022-12-19 ENCOUNTER — Ambulatory Visit: Payer: PPO | Admitting: Occupational Therapy

## 2022-12-19 DIAGNOSIS — M25621 Stiffness of right elbow, not elsewhere classified: Secondary | ICD-10-CM

## 2022-12-19 DIAGNOSIS — M25642 Stiffness of left hand, not elsewhere classified: Secondary | ICD-10-CM

## 2022-12-19 DIAGNOSIS — M24542 Contracture, left hand: Secondary | ICD-10-CM

## 2022-12-19 DIAGNOSIS — M25641 Stiffness of right hand, not elsewhere classified: Secondary | ICD-10-CM

## 2022-12-19 DIAGNOSIS — M25622 Stiffness of left elbow, not elsewhere classified: Secondary | ICD-10-CM

## 2022-12-19 DIAGNOSIS — M24541 Contracture, right hand: Secondary | ICD-10-CM
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
## 2022-12-26 ENCOUNTER — Ambulatory Visit: Payer: PPO | Admitting: Occupational Therapy

## 2023-01-02 ENCOUNTER — Ambulatory Visit: Payer: PPO | Admitting: Occupational Therapy

## 2023-01-09 ENCOUNTER — Ambulatory Visit: Payer: PPO | Attending: Physician Assistant | Admitting: Occupational Therapy

## 2023-01-09 DIAGNOSIS — R293 Abnormal posture: Secondary | ICD-10-CM | POA: Insufficient documentation

## 2023-01-09 DIAGNOSIS — Z741 Need for assistance with personal care: Secondary | ICD-10-CM

## 2023-01-09 DIAGNOSIS — Z993 Dependence on wheelchair: Secondary | ICD-10-CM | POA: Diagnosis present

## 2023-01-09 DIAGNOSIS — M6281 Muscle weakness (generalized): Secondary | ICD-10-CM | POA: Insufficient documentation

## 2023-01-09 DIAGNOSIS — M24541 Contracture, right hand: Secondary | ICD-10-CM

## 2023-01-09 DIAGNOSIS — M24542 Contracture, left hand: Secondary | ICD-10-CM | POA: Diagnosis present

## 2023-01-09 DIAGNOSIS — M25621 Stiffness of right elbow, not elsewhere classified: Secondary | ICD-10-CM

## 2023-01-09 DIAGNOSIS — R278 Other lack of coordination: Secondary | ICD-10-CM

## 2023-01-09 DIAGNOSIS — M6249 Contracture of muscle, multiple sites: Secondary | ICD-10-CM | POA: Insufficient documentation

## 2023-01-09 DIAGNOSIS — M25622 Stiffness of left elbow, not elsewhere classified: Secondary | ICD-10-CM
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
## 2023-01-16 ENCOUNTER — Ambulatory Visit: Payer: PPO | Admitting: Occupational Therapy

## 2023-01-16 DIAGNOSIS — M6249 Contracture of muscle, multiple sites: Secondary | ICD-10-CM

## 2023-01-16 DIAGNOSIS — M6281 Muscle weakness (generalized): Secondary | ICD-10-CM

## 2023-01-16 DIAGNOSIS — Z993 Dependence on wheelchair: Secondary | ICD-10-CM

## 2023-01-16 DIAGNOSIS — R293 Abnormal posture: Secondary | ICD-10-CM

## 2023-01-16 DIAGNOSIS — Z741 Need for assistance with personal care: Secondary | ICD-10-CM
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
## 2023-01-18 ENCOUNTER — Encounter (HOSPITAL_COMMUNITY): Payer: Self-pay | Admitting: Emergency Medicine

## 2023-01-18 ENCOUNTER — Emergency Department (HOSPITAL_COMMUNITY): Payer: PPO

## 2023-01-18 ENCOUNTER — Inpatient Hospital Stay (HOSPITAL_COMMUNITY)
Admission: EM | Admit: 2023-01-18 | Discharge: 2023-01-24 | DRG: 698 | Disposition: A | Discharge status: Home Health | Payer: PPO | Attending: Internal Medicine | Admitting: Internal Medicine

## 2023-01-18 ENCOUNTER — Other Ambulatory Visit: Payer: Self-pay

## 2023-01-18 DIAGNOSIS — E872 Acidosis, unspecified: Secondary | ICD-10-CM | POA: Diagnosis present

## 2023-01-18 DIAGNOSIS — F5101 Primary insomnia: Secondary | ICD-10-CM | POA: Diagnosis present

## 2023-01-18 DIAGNOSIS — I442 Atrioventricular block, complete: Secondary | ICD-10-CM | POA: Diagnosis present

## 2023-01-18 DIAGNOSIS — G8250 Quadriplegia, unspecified: Secondary | ICD-10-CM | POA: Diagnosis present

## 2023-01-18 DIAGNOSIS — E039 Hypothyroidism, unspecified: Secondary | ICD-10-CM | POA: Diagnosis present

## 2023-01-18 DIAGNOSIS — Z808 Family history of malignant neoplasm of other organs or systems: Secondary | ICD-10-CM

## 2023-01-18 DIAGNOSIS — S14109S Unspecified injury at unspecified level of cervical spinal cord, sequela: Secondary | ICD-10-CM

## 2023-01-18 DIAGNOSIS — I421 Obstructive hypertrophic cardiomyopathy: Secondary | ICD-10-CM | POA: Diagnosis present

## 2023-01-18 DIAGNOSIS — Z881 Allergy status to other antibiotic agents status: Secondary | ICD-10-CM

## 2023-01-18 DIAGNOSIS — G35 Multiple sclerosis: Secondary | ICD-10-CM | POA: Diagnosis present

## 2023-01-18 DIAGNOSIS — J9601 Acute respiratory failure with hypoxia: Secondary | ICD-10-CM | POA: Diagnosis present

## 2023-01-18 DIAGNOSIS — Z950 Presence of cardiac pacemaker: Secondary | ICD-10-CM

## 2023-01-18 DIAGNOSIS — Z90710 Acquired absence of both cervix and uterus: Secondary | ICD-10-CM

## 2023-01-18 DIAGNOSIS — I509 Heart failure, unspecified: Secondary | ICD-10-CM | POA: Diagnosis not present

## 2023-01-18 DIAGNOSIS — J302 Other seasonal allergic rhinitis: Secondary | ICD-10-CM | POA: Diagnosis present

## 2023-01-18 DIAGNOSIS — T17900A Unspecified foreign body in respiratory tract, part unspecified causing asphyxiation, initial encounter: Secondary | ICD-10-CM

## 2023-01-18 DIAGNOSIS — Z9884 Bariatric surgery status: Secondary | ICD-10-CM

## 2023-01-18 DIAGNOSIS — R1312 Dysphagia, oropharyngeal phase: Secondary | ICD-10-CM | POA: Diagnosis present

## 2023-01-18 DIAGNOSIS — R739 Hyperglycemia, unspecified: Secondary | ICD-10-CM | POA: Diagnosis present

## 2023-01-18 DIAGNOSIS — N319 Neuromuscular dysfunction of bladder, unspecified: Secondary | ICD-10-CM | POA: Diagnosis present

## 2023-01-18 DIAGNOSIS — E860 Dehydration: Secondary | ICD-10-CM | POA: Diagnosis present

## 2023-01-18 DIAGNOSIS — B9689 Other specified bacterial agents as the cause of diseases classified elsewhere: Secondary | ICD-10-CM | POA: Diagnosis present

## 2023-01-18 DIAGNOSIS — T83511A Infection and inflammatory reaction due to indwelling urethral catheter, initial encounter: Principal | ICD-10-CM | POA: Diagnosis present

## 2023-01-18 DIAGNOSIS — G9340 Encephalopathy, unspecified: Secondary | ICD-10-CM

## 2023-01-18 DIAGNOSIS — I471 Supraventricular tachycardia, unspecified: Secondary | ICD-10-CM | POA: Diagnosis present

## 2023-01-18 DIAGNOSIS — R509 Fever, unspecified: Secondary | ICD-10-CM | POA: Diagnosis present

## 2023-01-18 DIAGNOSIS — G8929 Other chronic pain: Secondary | ICD-10-CM | POA: Diagnosis present

## 2023-01-18 DIAGNOSIS — R5381 Other malaise: Secondary | ICD-10-CM | POA: Diagnosis present

## 2023-01-18 DIAGNOSIS — Z1152 Encounter for screening for COVID-19: Secondary | ICD-10-CM

## 2023-01-18 DIAGNOSIS — J690 Pneumonitis due to inhalation of food and vomit: Secondary | ICD-10-CM | POA: Diagnosis not present

## 2023-01-18 DIAGNOSIS — J9602 Acute respiratory failure with hypercapnia: Secondary | ICD-10-CM | POA: Diagnosis present

## 2023-01-18 DIAGNOSIS — E875 Hyperkalemia: Secondary | ICD-10-CM | POA: Diagnosis present

## 2023-01-18 DIAGNOSIS — Z8711 Personal history of peptic ulcer disease: Secondary | ICD-10-CM

## 2023-01-18 DIAGNOSIS — G4700 Insomnia, unspecified: Secondary | ICD-10-CM | POA: Diagnosis present

## 2023-01-18 DIAGNOSIS — Z888 Allergy status to other drugs, medicaments and biological substances status: Secondary | ICD-10-CM

## 2023-01-18 DIAGNOSIS — A4151 Sepsis due to Escherichia coli [E. coli]: Secondary | ICD-10-CM | POA: Diagnosis not present

## 2023-01-18 DIAGNOSIS — N390 Urinary tract infection, site not specified: Secondary | ICD-10-CM | POA: Diagnosis present

## 2023-01-18 DIAGNOSIS — M810 Age-related osteoporosis without current pathological fracture: Secondary | ICD-10-CM | POA: Diagnosis present

## 2023-01-18 DIAGNOSIS — R918 Other nonspecific abnormal finding of lung field: Secondary | ICD-10-CM | POA: Diagnosis not present

## 2023-01-18 DIAGNOSIS — A419 Sepsis, unspecified organism: Secondary | ICD-10-CM | POA: Diagnosis not present

## 2023-01-18 DIAGNOSIS — Z9049 Acquired absence of other specified parts of digestive tract: Secondary | ICD-10-CM

## 2023-01-18 DIAGNOSIS — F32A Depression, unspecified: Secondary | ICD-10-CM | POA: Diagnosis present

## 2023-01-18 DIAGNOSIS — A4159 Other Gram-negative sepsis: Secondary | ICD-10-CM | POA: Diagnosis present

## 2023-01-18 DIAGNOSIS — Y846 Urinary catheterization as the cause of abnormal reaction of the patient, or of later complication, without mention of misadventure at the time of the procedure: Secondary | ICD-10-CM | POA: Diagnosis present

## 2023-01-18 DIAGNOSIS — R6521 Severe sepsis with septic shock: Secondary | ICD-10-CM | POA: Diagnosis present

## 2023-01-18 DIAGNOSIS — G9341 Metabolic encephalopathy: Secondary | ICD-10-CM | POA: Diagnosis present

## 2023-01-18 DIAGNOSIS — Z79899 Other long term (current) drug therapy: Secondary | ICD-10-CM

## 2023-01-18 DIAGNOSIS — Z79890 Hormone replacement therapy (postmenopausal): Secondary | ICD-10-CM

## 2023-01-18 HISTORY — DX: Encephalopathy, unspecified: G93.40

## 2023-01-18 LAB — CBC WITH DIFFERENTIAL/PLATELET
Abs Immature Granulocytes: 0.02 10*3/uL (ref 0.00–0.07)
Basophils Absolute: 0 10*3/uL (ref 0.0–0.1)
Basophils Relative: 1 %
Eosinophils Absolute: 0.1 10*3/uL (ref 0.0–0.5)
Eosinophils Relative: 2 %
HCT: 45 % (ref 36.0–46.0)
Hemoglobin: 13.8 g/dL (ref 12.0–15.0)
Immature Granulocytes: 0 %
Lymphocytes Relative: 10 %
Lymphs Abs: 0.8 10*3/uL (ref 0.7–4.0)
MCH: 29 pg (ref 26.0–34.0)
MCHC: 30.7 g/dL (ref 30.0–36.0)
MCV: 94.5 fL (ref 80.0–100.0)
Monocytes Absolute: 0.5 10*3/uL (ref 0.1–1.0)
Monocytes Relative: 7 %
Neutro Abs: 6 10*3/uL (ref 1.7–7.7)
Neutrophils Relative %: 80 %
Platelets: 145 10*3/uL — ABNORMAL LOW (ref 150–400)
RBC: 4.76 MIL/uL (ref 3.87–5.11)
RDW: 13.8 % (ref 11.5–15.5)
WBC: 7.5 10*3/uL (ref 4.0–10.5)
nRBC: 0 % (ref 0.0–0.2)

## 2023-01-18 LAB — I-STAT ARTERIAL BLOOD GAS, ED
Acid-Base Excess: 0 mmol/L (ref 0.0–2.0)
Bicarbonate: 27.6 mmol/L (ref 20.0–28.0)
Calcium, Ion: 1.24 mmol/L (ref 1.15–1.40)
HCT: 38 % (ref 36.0–46.0)
Hemoglobin: 12.9 g/dL (ref 12.0–15.0)
O2 Saturation: 99 %
Patient temperature: 98.6
Potassium: 4.1 mmol/L (ref 3.5–5.1)
Sodium: 141 mmol/L (ref 135–145)
TCO2: 29 mmol/L (ref 22–32)
pCO2 arterial: 59.7 mm[Hg] — ABNORMAL HIGH (ref 32–48)
pH, Arterial: 7.273 — ABNORMAL LOW (ref 7.35–7.45)
pO2, Arterial: 140 mm[Hg] — ABNORMAL HIGH (ref 83–108)

## 2023-01-18 LAB — RESP PANEL BY RT-PCR (RSV, FLU A&B, COVID)  RVPGX2
Influenza A by PCR: NEGATIVE
Influenza B by PCR: NEGATIVE
Resp Syncytial Virus by PCR: NEGATIVE
SARS Coronavirus 2 by RT PCR: NEGATIVE

## 2023-01-18 LAB — I-STAT CHEM 8, ED
BUN: 22 mg/dL (ref 8–23)
Calcium, Ion: 1.06 mmol/L — ABNORMAL LOW (ref 1.15–1.40)
Chloride: 108 mmol/L (ref 98–111)
Creatinine, Ser: 0.6 mg/dL (ref 0.44–1.00)
Glucose, Bld: 104 mg/dL — ABNORMAL HIGH (ref 70–99)
HCT: 49 % — ABNORMAL HIGH (ref 36.0–46.0)
Hemoglobin: 16.7 g/dL — ABNORMAL HIGH (ref 12.0–15.0)
Potassium: 5.3 mmol/L — ABNORMAL HIGH (ref 3.5–5.1)
Sodium: 139 mmol/L (ref 135–145)
TCO2: 26 mmol/L (ref 22–32)

## 2023-01-18 LAB — URINALYSIS, W/ REFLEX TO CULTURE (INFECTION SUSPECTED)
Bilirubin Urine: NEGATIVE
Glucose, UA: NEGATIVE mg/dL
Ketones, ur: 5 mg/dL — AB
Nitrite: NEGATIVE
Protein, ur: 30 mg/dL — AB
RBC / HPF: >50 RBC/hpf (ref 0–5)
Specific Gravity, Urine: 1.024 (ref 1.005–1.030)
WBC, UA: >50 WBC/hpf (ref 0–5)
pH: 5 (ref 5.0–8.0)

## 2023-01-18 LAB — COMPREHENSIVE METABOLIC PANEL
ALT: 16 U/L (ref 0–44)
AST: 28 U/L (ref 15–41)
Albumin: 3 g/dL — ABNORMAL LOW (ref 3.5–5.0)
Alkaline Phosphatase: 82 U/L (ref 38–126)
Anion gap: 10 (ref 5–15)
BUN: 18 mg/dL (ref 8–23)
CO2: 25 mmol/L (ref 22–32)
Calcium: 8.6 mg/dL — ABNORMAL LOW (ref 8.9–10.3)
Chloride: 105 mmol/L (ref 98–111)
Creatinine, Ser: 0.62 mg/dL (ref 0.44–1.00)
GFR, Estimated: >60 mL/min (ref >60)
Glucose, Bld: 149 mg/dL — ABNORMAL HIGH (ref 70–99)
Potassium: 4.4 mmol/L (ref 3.5–5.1)
Sodium: 140 mmol/L (ref 135–145)
Total Bilirubin: 0.6 mg/dL (ref 0.3–1.2)
Total Protein: 5.8 g/dL — ABNORMAL LOW (ref 6.5–8.1)

## 2023-01-18 LAB — PROTIME-INR
INR: 1 (ref 0.8–1.2)
Prothrombin Time: 13.2 s (ref 11.4–15.2)

## 2023-01-18 LAB — APTT: aPTT: 30 s (ref 24–36)

## 2023-01-18 LAB — I-STAT CG4 LACTIC ACID, ED
Lactic Acid, Venous: 3 mmol/L (ref 0.5–1.9)
Lactic Acid, Venous: 1 mmol/L (ref 0.5–1.9)

## 2023-01-18 MED ORDER — SODIUM CHLORIDE 0.9 % IV SOLN
2.0000 g | Freq: Three times a day (TID) | INTRAVENOUS | Status: DC
  Administered 2023-01-18 – 2023-01-20 (×5): 2 g via INTRAVENOUS
  Filled 2023-01-18 (×6): qty 10

## 2023-01-18 MED ORDER — LACTATED RINGERS IV BOLUS (SEPSIS)
500.0000 mL | Freq: Once | INTRAVENOUS | Status: AC
  Administered 2023-01-18: 500 mL via INTRAVENOUS

## 2023-01-18 MED ORDER — METRONIDAZOLE 500 MG/100ML IV SOLN
500.0000 mg | Freq: Once | INTRAVENOUS | Status: AC
  Administered 2023-01-18: 500 mg via INTRAVENOUS
  Filled 2023-01-18: qty 100

## 2023-01-18 MED ORDER — NOREPINEPHRINE 4 MG/250ML-% IV SOLN
2.0000 ug/min | INTRAVENOUS | Status: DC
  Administered 2023-01-18: 1 ug/min via INTRAVENOUS

## 2023-01-18 MED ORDER — LACTATED RINGERS IV BOLUS (SEPSIS)
1000.0000 mL | Freq: Once | INTRAVENOUS | Status: AC
  Administered 2023-01-18: 1000 mL via INTRAVENOUS

## 2023-01-18 MED ORDER — LACTATED RINGERS IV BOLUS (SEPSIS)
250.0000 mL | Freq: Once | INTRAVENOUS | Status: AC
  Administered 2023-01-18: 250 mL via INTRAVENOUS

## 2023-01-18 MED ORDER — VANCOMYCIN HCL IN DEXTROSE 1-5 GM/200ML-% IV SOLN
1000.0000 mg | Freq: Once | INTRAVENOUS | Status: AC
  Administered 2023-01-18: 1000 mg via INTRAVENOUS
  Filled 2023-01-18: qty 200

## 2023-01-18 MED ORDER — LACTATED RINGERS IV SOLN: INTRAVENOUS | Status: AC

## 2023-01-18 MED ORDER — ENOXAPARIN SODIUM 40 MG/0.4ML IJ SOSY
40.0000 mg | PREFILLED_SYRINGE | INTRAMUSCULAR | Status: DC
  Administered 2023-01-18 – 2023-01-23 (×6): 40 mg via SUBCUTANEOUS
  Filled 2023-01-18 (×6): qty 0.4

## 2023-01-18 MED ORDER — ACETAMINOPHEN 650 MG RE SUPP: 650.0000 mg | Freq: Four times a day (QID) | RECTAL | Status: DC | PRN

## 2023-01-18 MED ORDER — SODIUM CHLORIDE 0.9 % IV SOLN
2.0000 g | Freq: Once | INTRAVENOUS | Status: AC
  Administered 2023-01-18: 2 g via INTRAVENOUS
  Filled 2023-01-18: qty 10

## 2023-01-18 MED ORDER — SODIUM CHLORIDE 0.9 % IV SOLN: 250.0000 mL | INTRAVENOUS | Status: DC

## 2023-01-18 MED ORDER — ACETAMINOPHEN 325 MG PO TABS
650.0000 mg | ORAL_TABLET | Freq: Four times a day (QID) | ORAL | Status: DC | PRN
  Administered 2023-01-19 – 2023-01-21 (×3): 650 mg via ORAL
  Filled 2023-01-18 (×3): qty 2
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
## 2023-01-18 NOTE — ED Notes (Signed)
Patient placed onto cardiac monitor

## 2023-01-18 NOTE — Sepsis Progress Note (Signed)
Sepsis protocol is being followed by [facility]. 

## 2023-01-18 NOTE — Progress Notes (Signed)
ED Pharmacy Antibiotic Sign Off An antibiotic consult was received from an ED provider for vancomycin and aztreonam per pharmacy dosing for sepsis. A chart review was completed to assess appropriateness.  A single dose of vancomycin, aztreonam, and flagyl  was placed by the ED provider.   The following one time order(s) were placed per pharmacy consult: None  Further antibiotic and/or antibiotic pharmacy consults should be ordered by the admitting provider if indicated.   Thank you for allowing pharmacy to be a part of this patient's care.   Delmar Landau, PharmD, BCPS 01/18/2023 12:38 PM ED Clinical Pharmacist -  [phone]

## 2023-01-18 NOTE — Progress Notes (Addendum)
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  Pt has history of recurrent UTI's, most recently pseudomonas UTI in Feb 2024 (no susceptibility data available).  Pharmacy has been consulted for aztreonam dosing.  Outside UCx 9/20 with Citrobacter freundii (S: Bactrim, Macrobid, ertapenem, imipenem; I: meropenem, tobramycin) - prescribed Bactrim x10 days (completed 3 days per family)  9/28: Scr 0.60, WBC 7.5  Vital Signs: Tm 101.4, HR 73, BP 118/68  Estimated Creatinine Clearance: 50.6 mL/min (by C-G formula based on SCr of 0.6 mg/dL).  Plan: START aztreonam 2 gm IV q8h Monitor renal function, clinical status, C/S, de-escalation   Allergies:  Allergies  Allergen Reactions   Ceftin [Cefuroxime] Swelling    Pt states she tolerated penicillins in the past   Levofloxacin Anaphylaxis   Cephalosporins Hives   Pregabalin Swelling    Bladder problems    Filed Weights   01/18/23 1904  Weight: 54.4 kg (119 lb 14.9 oz)       Latest Ref Rng & Units 01/18/2023    1:10 PM 01/18/2023   11:47 AM 01/18/2023   11:46 AM  CBC  WBC 4.0 - 10.5 K/uL   7.5   Hemoglobin 12.0 - 15.0 g/dL 86.5  78.4  69.6   Hematocrit 36.0 - 46.0 % 38.0  49.0  45.0   Platelets 150 - 400 K/uL   145     Antibiotics Given (last 72 hours)     Date/Time Action Medication Dose Rate   01/18/23 1251 New Bag/Given   aztreonam (AZACTAM) 2 g in sodium chloride 0.9 % 100 mL IVPB 2 g 200 mL/hr   01/18/23 1252 New Bag/Given   metroNIDAZOLE (FLAGYL) IVPB 500 mg 500 mg 100 mL/hr   01/18/23 1343 New Bag/Given   vancomycin (VANCOCIN) IVPB 1000 mg/200 mL premix 1,000 mg 200 mL/hr       Antimicrobials this admission: Aztreonam 9/28 >> c Vancomycin 9/28 x1 Flagyl 9/28 x1  Microbiology results: 9/20 UCx from [facility]: Citrobacter freundii (S: Bactrim, Macrobid, ertapenem, imipenem;  I: meropenem, tobramycin)  9/28 Bcx: sent 9/28 Ucx: sent   Thank you for allowing pharmacy to be a part of this patient's care.  Trixie Rude, PharmD Clinical Pharmacist 01/18/2023  7:44 PM
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
## 2023-01-18 NOTE — Hospital Course (Addendum)
[name] is a 77 y.o. person living with a history of post-traumatic quadriplegia, multiple sclerosis, hypothyroidism, and history of complete heart block s/p pacemaker placement who presented with progressive weakness and lethargy and was admitted for acute encephalopathy secondary to a urinary tract infection and subsequently found to have aspiration pneumonia secondary to silent aspiration.  Acute encephalopathy, resolved Sepsis, secondary to a urinary tract infection, resolved Aspiration pneumonia, secondary to silent aspiration Patient went to the urology office on 9/20 with symptoms of a urinary tract infection, which was confirmed with a urinalysis.  Urine culture at that time demonstrated Citrobacter freundii, susceptible to ertapenem, imipenem, nitrofurantoin, and bactrim.  She started taking Bactrim on 9/25, however her confusion and lethargy continued to worsen.  Urinalysis in the emergency department was also consistent with a UTI.  She was started on aztreonam given concerns for possible cephalosporin allergy.  Urine cultures from admission grew Citrobacter freundii and Enterococcus faecalis.  She was switched to amoxicillin and Bactrim given the bacterial susceptibilities and began to show clinical improvement.  As her encephalopathy was resolving, a speech evaluation was done and she was restarted on a soft diet with meds crushed in pure. The next day, it was noted her oxygen was dropping on room air, so a chest x-ray was obtained which demonstrated basilar opacities concerning for possible aspiration pneumonia.  A modified barium swallow confirmed silent aspiration, so her diet was changed to thick nectar and her amoxicillin was switched to Augmentin for aspiration pneumonia coverage.  She continued to remain asymptomatic and afebrile.  Of note, she was found to be hypotensive overnight several nights with blood pressure in the 80s/50s, which was treated with fluids and midodrine.  She  was asymptomatic during these episodes.  Ultimately, we came to the conclusion that this may be her baseline given her spinal cord injury, as she demonstrated no symptoms or signs of sepsis and it consistently occurred at night. Of note, her AM cortisol was 8.5, but this is in the setting of an acute illness so she may need further workup of adrenal insufficiency outpatient.  Overall, she has been clinically stable for several days, has remained afebrile, has no leukocytosis, denies any shortness of breath, and her oxygen requirement is down to 2 L nasal cannula, so we feel she is stable for discharge with home health services and oxygen while she completes the remaining 5 days of her antibiotic course.
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
## 2023-01-18 NOTE — ED Triage Notes (Signed)
Pt from home via [facility] with reports of AMS. Pt being treated for UTI. Pt has chronic foley. Pt is on 10L via NRB mask. Pts O2 95%. Pt disoriented x 4.

## 2023-01-19 DIAGNOSIS — T83511A Infection and inflammatory reaction due to indwelling urethral catheter, initial encounter: Principal | ICD-10-CM

## 2023-01-19 DIAGNOSIS — G9340 Encephalopathy, unspecified: Secondary | ICD-10-CM | POA: Diagnosis not present

## 2023-01-19 DIAGNOSIS — N390 Urinary tract infection, site not specified: Secondary | ICD-10-CM

## 2023-01-19 LAB — CBC
HCT: 43.2 % (ref 36.0–46.0)
Hemoglobin: 13.7 g/dL (ref 12.0–15.0)
MCH: 30.1 pg (ref 26.0–34.0)
MCHC: 31.7 g/dL (ref 30.0–36.0)
MCV: 94.9 fL (ref 80.0–100.0)
Platelets: 141 10*3/uL — ABNORMAL LOW (ref 150–400)
RBC: 4.55 MIL/uL (ref 3.87–5.11)
RDW: 13.6 % (ref 11.5–15.5)
WBC: 5.7 10*3/uL (ref 4.0–10.5)
nRBC: 0 % (ref 0.0–0.2)

## 2023-01-19 LAB — BASIC METABOLIC PANEL
Anion gap: 8 (ref 5–15)
BUN: 16 mg/dL (ref 8–23)
CO2: 24 mmol/L (ref 22–32)
Calcium: 8.5 mg/dL — ABNORMAL LOW (ref 8.9–10.3)
Chloride: 106 mmol/L (ref 98–111)
Creatinine, Ser: 0.36 mg/dL — ABNORMAL LOW (ref 0.44–1.00)
GFR, Estimated: >60 mL/min (ref >60)
Glucose, Bld: 128 mg/dL — ABNORMAL HIGH (ref 70–99)
Potassium: 4 mmol/L (ref 3.5–5.1)
Sodium: 138 mmol/L (ref 135–145)

## 2023-01-19 LAB — TSH: TSH: 0.297 u[IU]/mL — ABNORMAL LOW (ref 0.350–4.500)

## 2023-01-19 MED ORDER — FESOTERODINE FUMARATE ER 4 MG PO TB24
4.0000 mg | ORAL_TABLET | Freq: Every day | ORAL | Status: DC
  Administered 2023-01-19 – 2023-01-24 (×6): 4 mg via ORAL
  Filled 2023-01-19 (×6): qty 1

## 2023-01-19 MED ORDER — GABAPENTIN 600 MG PO TABS
600.0000 mg | ORAL_TABLET | Freq: Three times a day (TID) | ORAL | Status: DC
  Filled 2023-01-19 (×3): qty 1

## 2023-01-19 MED ORDER — LEVOTHYROXINE SODIUM 25 MCG PO TABS
25.0000 ug | ORAL_TABLET | Freq: Every day | ORAL | Status: DC
  Administered 2023-01-20 – 2023-01-24 (×5): 25 ug via ORAL
  Filled 2023-01-19 (×5): qty 1

## 2023-01-19 MED ORDER — MONTELUKAST SODIUM 10 MG PO TABS
10.0000 mg | ORAL_TABLET | Freq: Every day | ORAL | Status: DC
  Administered 2023-01-19 – 2023-01-23 (×5): 10 mg via ORAL
  Filled 2023-01-19 (×5): qty 1

## 2023-01-19 MED ORDER — DULOXETINE HCL 60 MG PO CPEP
60.0000 mg | ORAL_CAPSULE | Freq: Every day | ORAL | Status: DC
  Administered 2023-01-19 – 2023-01-24 (×6): 60 mg via ORAL
  Filled 2023-01-19 (×6): qty 1

## 2023-01-19 MED ORDER — GABAPENTIN 300 MG PO CAPS
600.0000 mg | ORAL_CAPSULE | Freq: Three times a day (TID) | ORAL | Status: DC
  Administered 2023-01-19 – 2023-01-24 (×16): 600 mg via ORAL
  Filled 2023-01-19 (×16): qty 2

## 2023-01-19 MED ORDER — DICLOFENAC SODIUM 1 % EX GEL
2.0000 g | Freq: Four times a day (QID) | CUTANEOUS | Status: DC
  Administered 2023-01-19 – 2023-01-24 (×18): 2 g via TOPICAL
  Filled 2023-01-19: qty 100

## 2023-01-19 MED ORDER — MIRABEGRON ER 25 MG PO TB24
50.0000 mg | ORAL_TABLET | Freq: Every day | ORAL | Status: DC
  Administered 2023-01-19 – 2023-01-24 (×6): 50 mg via ORAL
  Filled 2023-01-19: qty 2
  Filled 2023-01-19: qty 1
  Filled 2023-01-19 (×5): qty 2

## 2023-01-19 MED ORDER — BACLOFEN 10 MG PO TABS
20.0000 mg | ORAL_TABLET | Freq: Three times a day (TID) | ORAL | Status: DC
  Administered 2023-01-19 – 2023-01-24 (×15): 20 mg via ORAL
  Filled 2023-01-19 (×15): qty 2
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
## 2023-01-19 NOTE — Plan of Care (Signed)
  Problem: Clinical Measurements: Goal: Ability to maintain clinical measurements within normal limits will improve Outcome: Progressing   Problem: Clinical Measurements: Goal: Will remain free from infection Outcome: Progressing   Problem: Clinical Measurements: Goal: Diagnostic test results will improve Outcome: Progressing   Problem: Clinical Measurements: Goal: Respiratory complications will improve Outcome: Progressing   
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
## 2023-01-20 ENCOUNTER — Encounter: Payer: Self-pay | Admitting: Occupational Therapy

## 2023-01-20 DIAGNOSIS — N390 Urinary tract infection, site not specified: Secondary | ICD-10-CM | POA: Diagnosis not present

## 2023-01-20 DIAGNOSIS — G9340 Encephalopathy, unspecified: Secondary | ICD-10-CM | POA: Diagnosis not present

## 2023-01-20 DIAGNOSIS — A419 Sepsis, unspecified organism: Secondary | ICD-10-CM | POA: Diagnosis not present

## 2023-01-20 LAB — URINE CULTURE

## 2023-01-20 LAB — CBC
HCT: 41.7 % (ref 36.0–46.0)
Hemoglobin: 13.5 g/dL (ref 12.0–15.0)
MCH: 29.9 pg (ref 26.0–34.0)
MCHC: 32.4 g/dL (ref 30.0–36.0)
MCV: 92.3 fL (ref 80.0–100.0)
Platelets: 143 10*3/uL — ABNORMAL LOW (ref 150–400)
RBC: 4.52 MIL/uL (ref 3.87–5.11)
RDW: 13.4 % (ref 11.5–15.5)
WBC: 5.3 10*3/uL (ref 4.0–10.5)
nRBC: 0 % (ref 0.0–0.2)

## 2023-01-20 LAB — BASIC METABOLIC PANEL
Anion gap: 10 (ref 5–15)
BUN: 13 mg/dL (ref 8–23)
CO2: 23 mmol/L (ref 22–32)
Calcium: 8.1 mg/dL — ABNORMAL LOW (ref 8.9–10.3)
Chloride: 101 mmol/L (ref 98–111)
Creatinine, Ser: 0.36 mg/dL — ABNORMAL LOW (ref 0.44–1.00)
GFR, Estimated: >60 mL/min (ref >60)
Glucose, Bld: 91 mg/dL (ref 70–99)
Potassium: 3.2 mmol/L — ABNORMAL LOW (ref 3.5–5.1)
Sodium: 134 mmol/L — ABNORMAL LOW (ref 135–145)

## 2023-01-20 LAB — MAGNESIUM: Magnesium: 1.7 mg/dL (ref 1.7–2.4)

## 2023-01-20 LAB — GLUCOSE, CAPILLARY: Glucose-Capillary: 128 mg/dL — ABNORMAL HIGH (ref 70–99)

## 2023-01-20 MED ORDER — METOPROLOL TARTRATE 12.5 MG HALF TABLET
12.5000 mg | ORAL_TABLET | Freq: Two times a day (BID) | ORAL | Status: DC
  Administered 2023-01-20 (×2): 12.5 mg via ORAL
  Filled 2023-01-20 (×2): qty 1

## 2023-01-20 MED ORDER — TRAZODONE HCL 100 MG PO TABS
100.0000 mg | ORAL_TABLET | Freq: Every day | ORAL | Status: AC
  Administered 2023-01-21: 100 mg via ORAL
  Filled 2023-01-20: qty 1

## 2023-01-20 MED ORDER — SULFAMETHOXAZOLE-TRIMETHOPRIM 800-160 MG PO TABS
1.0000 | ORAL_TABLET | Freq: Two times a day (BID) | ORAL | Status: DC
  Administered 2023-01-20 – 2023-01-24 (×9): 1 via ORAL
  Filled 2023-01-20 (×9): qty 1

## 2023-01-20 MED ORDER — TRAZODONE HCL 100 MG PO TABS
100.0000 mg | ORAL_TABLET | Freq: Every day | ORAL | Status: DC
  Administered 2023-01-20: 100 mg via ORAL
  Filled 2023-01-20: qty 1

## 2023-01-20 MED ORDER — TRAZODONE HCL 50 MG PO TABS: 50.0000 mg | ORAL_TABLET | Freq: Every day | ORAL | Status: DC

## 2023-01-20 MED ORDER — ENSURE ENLIVE PO LIQD
237.0000 mL | Freq: Two times a day (BID) | ORAL | Status: DC
  Administered 2023-01-20 – 2023-01-23 (×7): 237 mL via ORAL

## 2023-01-20 MED ORDER — AMOXICILLIN 400 MG/5ML PO SUSR
1000.0000 mg | Freq: Two times a day (BID) | ORAL | Status: DC
  Administered 2023-01-20 – 2023-01-21 (×3): 1000 mg via ORAL
  Filled 2023-01-20 (×4): qty 15

## 2023-01-20 MED ORDER — POTASSIUM CHLORIDE CRYS ER 20 MEQ PO TBCR
30.0000 meq | EXTENDED_RELEASE_TABLET | Freq: Two times a day (BID) | ORAL | Status: AC
  Administered 2023-01-20 (×2): 30 meq via ORAL
  Filled 2023-01-20 (×2): qty 1

## 2023-01-20 MED ORDER — ONDANSETRON HCL 4 MG/2ML IJ SOLN
4.0000 mg | Freq: Once | INTRAMUSCULAR | Status: AC
  Administered 2023-01-20: 4 mg via INTRAVENOUS
  Filled 2023-01-20: qty 2
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
## 2023-01-20 NOTE — Therapy (Signed)
Pt has been hospitalized due to UTI. Recommend pt call to schedule appointment and OT will re-certify this pt as needed.

## 2023-01-20 NOTE — Plan of Care (Signed)
  Problem: Health Behavior/Discharge Planning: Goal: Ability to manage health-related needs will improve Outcome: Progressing   Problem: Clinical Measurements: Goal: Will remain free from infection Outcome: Progressing   Problem: Clinical Measurements: Goal: Diagnostic test results will improve Outcome: Progressing   

## 2023-01-20 NOTE — Plan of Care (Signed)
  Problem: Education: Goal: Knowledge of General Education information will improve Description: Including pain rating scale, medication(s)/side effects and non-pharmacologic comfort measures Outcome: Progressing   Problem: Health Behavior/Discharge Planning: Goal: Ability to manage health-related needs will improve Outcome: Progressing   Problem: Clinical Measurements: Goal: Diagnostic test results will improve Outcome: Progressing   

## 2023-01-21 ENCOUNTER — Inpatient Hospital Stay (HOSPITAL_COMMUNITY): Payer: PPO

## 2023-01-21 DIAGNOSIS — G9340 Encephalopathy, unspecified: Secondary | ICD-10-CM | POA: Diagnosis not present

## 2023-01-21 DIAGNOSIS — N390 Urinary tract infection, site not specified: Secondary | ICD-10-CM | POA: Diagnosis not present

## 2023-01-21 DIAGNOSIS — A419 Sepsis, unspecified organism: Secondary | ICD-10-CM | POA: Diagnosis not present

## 2023-01-21 LAB — BASIC METABOLIC PANEL
Anion gap: 6 (ref 5–15)
BUN: 12 mg/dL (ref 8–23)
CO2: 26 mmol/L (ref 22–32)
Calcium: 7.8 mg/dL — ABNORMAL LOW (ref 8.9–10.3)
Chloride: 103 mmol/L (ref 98–111)
Creatinine, Ser: 0.39 mg/dL — ABNORMAL LOW (ref 0.44–1.00)
GFR, Estimated: >60 mL/min (ref >60)
Glucose, Bld: 112 mg/dL — ABNORMAL HIGH (ref 70–99)
Potassium: 3.9 mmol/L (ref 3.5–5.1)
Sodium: 135 mmol/L (ref 135–145)

## 2023-01-21 LAB — ALBUMIN: Albumin: 1.9 g/dL — ABNORMAL LOW (ref 3.5–5.0)

## 2023-01-21 LAB — CBC
HCT: 35.9 % — ABNORMAL LOW (ref 36.0–46.0)
Hemoglobin: 11.7 g/dL — ABNORMAL LOW (ref 12.0–15.0)
MCH: 29.8 pg (ref 26.0–34.0)
MCHC: 32.6 g/dL (ref 30.0–36.0)
MCV: 91.6 fL (ref 80.0–100.0)
Platelets: 132 10*3/uL — ABNORMAL LOW (ref 150–400)
RBC: 3.92 MIL/uL (ref 3.87–5.11)
RDW: 13.5 % (ref 11.5–15.5)
WBC: 8.6 10*3/uL (ref 4.0–10.5)
nRBC: 0 % (ref 0.0–0.2)

## 2023-01-21 MED ORDER — FLUTICASONE PROPIONATE 50 MCG/ACT NA SUSP
1.0000 | Freq: Every day | NASAL | Status: DC
  Administered 2023-01-21 – 2023-01-24 (×4): 1 via NASAL
  Filled 2023-01-21: qty 16

## 2023-01-21 MED ORDER — CHLORHEXIDINE GLUCONATE CLOTH 2 % EX PADS
6.0000 | MEDICATED_PAD | Freq: Every day | CUTANEOUS | Status: DC
  Administered 2023-01-21 – 2023-01-24 (×4): 6 via TOPICAL

## 2023-01-21 MED ORDER — AMOXICILLIN-POT CLAVULANATE 875-125 MG PO TABS
1.0000 | ORAL_TABLET | Freq: Two times a day (BID) | ORAL | Status: DC
  Administered 2023-01-21 – 2023-01-24 (×6): 1 via ORAL
  Filled 2023-01-21 (×6): qty 1

## 2023-01-21 MED ORDER — GUAIFENESIN ER 600 MG PO TB12
600.0000 mg | ORAL_TABLET | Freq: Two times a day (BID) | ORAL | Status: DC
  Administered 2023-01-21: 600 mg via ORAL
  Filled 2023-01-21 (×2): qty 1

## 2023-01-21 MED ORDER — TRAMADOL HCL 50 MG PO TABS
50.0000 mg | ORAL_TABLET | Freq: Four times a day (QID) | ORAL | Status: DC | PRN
  Filled 2023-01-21: qty 1
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
## 2023-01-21 NOTE — Plan of Care (Signed)

## 2023-01-21 NOTE — Care Management Important Message (Signed)
Important Message  Patient Details  Name: [name] MRN: [id number] Date of Birth: 1945-10-19   Important Message Given:  Yes - Medicare IM     Dorena Bodo 01/21/2023, 3:35 PM

## 2023-01-21 NOTE — TOC Initial Note (Signed)
Transition of Care [facility]) - Initial/Assessment Note    Patient Details  Name: [name] MRN: [id number] Date of Birth: Jun 03, 1945  Transition of Care [facility]) CM/SW Contact:    Leone Haven, RN Phone Number: 01/21/2023, 6:18 PM  Clinical Narrative:                 From home with daughter, Angelique Blonder.  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  States they have a handicapped Zenaida Niece that will transport her  home at dc and family is support system, states [facility] [place].  Pta Quad, bed bound.  Expected Discharge Plan: Home w Home Health Services Barriers to Discharge: Continued Medical Work up   Patient Goals and CMS Choice Patient states their goals for this hospitalization and ongoing recovery are:: quad, stays with daughter          Expected Discharge Plan and Services In-house Referral: NA Discharge Planning Services: CM Consult Post Acute Care Choice: Resumption of Svcs/PTA Provider, Home Health Living arrangements for the past 2 months: Single Family Home                 DME Arranged: N/A DME Agency: NA                  Prior Living Arrangements/Services Living arrangements for the past 2 months: Single Family Home Lives with:: Adult Children Patient language and need for interpreter reviewed:: Yes Do you feel safe going back to the place where you live?: Yes      Need for Family Participation in Patient Care: Yes (Comment) Care giver support system in place?: Yes (comment) Current home services: DME (has all the DME she needs lifts etc.) Criminal Activity/Legal Involvement Pertinent to Current Situation/Hospitalization: No - Comment as needed  Activities of Daily Living      Permission Sought/Granted Permission sought to share information with : Case  Manager Permission granted to share information with : Yes, Verbal Permission Granted              Emotional Assessment         Alcohol / Substance Use: Not Applicable Psych Involvement: No (comment)  Admission diagnosis:  Acute encephalopathy [G93.40] Urinary tract infection associated with catheterization of urinary tract, unspecified indwelling urinary catheter type, initial encounter (HCC) [W09.811B, N39.0] Sepsis with acute hypercapnic respiratory failure, due to unspecified organism, unspecified whether septic shock present (HCC) [A41.9, R65.20, J96.02] Patient Active Problem List   Diagnosis Date Noted   Catheter-associated urinary tract infection (HCC) 01/19/2023   Acute encephalopathy 01/18/2023   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   PCP:  Ladora Daniel, PA-C Pharmacy[facility]   [phone] [place] [place] - [address] [place] [address] Phone: [phone] Fax: [phone]     Social Determinants of Health (SDOH) Social History: SDOH Screenings   Food Insecurity: No Food Insecurity (04/29/2022)   Received from [facility], [facility]  Transportation Needs: No Transportation Needs (06/10/2022)   Received from [facility], [facility]  Utilities: Not At Risk (04/29/2022)   Received from [facility], [facility]  Financial Resource Strain: Low Risk  (04/29/2022)   Received from [facility], [facility]  Physical Activity: Unknown (04/29/2022)  Received from [facility], [facility]  Social Connections: Moderately Integrated (04/29/2022)   Received from [facility], [facility]  Stress: No Stress Concern Present (12/24/2022)   Received from [facility]  Tobacco Use: Low Risk  (01/18/2023)   SDOH Interventions:     Readmission Risk Interventions     No data to display
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
## 2023-01-22 ENCOUNTER — Inpatient Hospital Stay (HOSPITAL_COMMUNITY): Payer: PPO

## 2023-01-22 DIAGNOSIS — I509 Heart failure, unspecified: Secondary | ICD-10-CM | POA: Diagnosis not present

## 2023-01-22 DIAGNOSIS — R918 Other nonspecific abnormal finding of lung field: Secondary | ICD-10-CM | POA: Diagnosis not present

## 2023-01-22 DIAGNOSIS — N390 Urinary tract infection, site not specified: Secondary | ICD-10-CM | POA: Diagnosis not present

## 2023-01-22 DIAGNOSIS — A4151 Sepsis due to Escherichia coli [E. coli]: Secondary | ICD-10-CM

## 2023-01-22 DIAGNOSIS — G9340 Encephalopathy, unspecified: Secondary | ICD-10-CM | POA: Diagnosis not present

## 2023-01-22 DIAGNOSIS — Q208 Other congenital malformations of cardiac chambers and connections: Secondary | ICD-10-CM

## 2023-01-22 LAB — BASIC METABOLIC PANEL
Anion gap: 7 (ref 5–15)
BUN: 10 mg/dL (ref 8–23)
CO2: 24 mmol/L (ref 22–32)
Calcium: 8 mg/dL — ABNORMAL LOW (ref 8.9–10.3)
Chloride: 103 mmol/L (ref 98–111)
Creatinine, Ser: 0.47 mg/dL (ref 0.44–1.00)
GFR, Estimated: >60 mL/min (ref >60)
Glucose, Bld: 81 mg/dL (ref 70–99)
Potassium: 4.5 mmol/L (ref 3.5–5.1)
Sodium: 134 mmol/L — ABNORMAL LOW (ref 135–145)

## 2023-01-22 LAB — BLOOD GAS, VENOUS
Acid-Base Excess: 2.5 mmol/L — ABNORMAL HIGH (ref 0.0–2.0)
Bicarbonate: 28.4 mmol/L — ABNORMAL HIGH (ref 20.0–28.0)
Drawn by: 7327
O2 Saturation: 71.9 %
Patient temperature: 36.3
pCO2, Ven: 47 mm[Hg] (ref 44–60)
pH, Ven: 7.39 (ref 7.25–7.43)
pO2, Ven: 39 mm[Hg] (ref 32–45)

## 2023-01-22 LAB — CBC
HCT: 37.5 % (ref 36.0–46.0)
Hemoglobin: 12 g/dL (ref 12.0–15.0)
MCH: 29.8 pg (ref 26.0–34.0)
MCHC: 32 g/dL (ref 30.0–36.0)
MCV: 93.1 fL (ref 80.0–100.0)
Platelets: 109 10*3/uL — ABNORMAL LOW (ref 150–400)
RBC: 4.03 MIL/uL (ref 3.87–5.11)
RDW: 13.5 % (ref 11.5–15.5)
WBC: 8.5 10*3/uL (ref 4.0–10.5)
nRBC: 0 % (ref 0.0–0.2)

## 2023-01-22 LAB — ECHOCARDIOGRAM COMPLETE
AR max vel: 0.98 cm2
AV Peak grad: 27.6 mm[Hg]
Ao pk vel: 2.63 m/s
Area-P 1/2: 4.06 cm2
Height: 65 in
S' Lateral: 2.7 cm
Weight: 2169.33 [oz_av]

## 2023-01-22 LAB — LACTIC ACID, PLASMA: Lactic Acid, Venous: 1.5 mmol/L (ref 0.5–1.9)

## 2023-01-22 MED ORDER — LACTATED RINGERS IV BOLUS
1000.0000 mL | Freq: Once | INTRAVENOUS | Status: AC
  Administered 2023-01-22: 1000 mL via INTRAVENOUS

## 2023-01-22 MED ORDER — FUROSEMIDE 10 MG/ML IJ SOLN
40.0000 mg | Freq: Once | INTRAMUSCULAR | Status: AC
  Administered 2023-01-22: 40 mg via INTRAVENOUS
  Filled 2023-01-22: qty 4

## 2023-01-22 MED ORDER — TRAZODONE HCL 100 MG PO TABS
100.0000 mg | ORAL_TABLET | Freq: Once | ORAL | Status: AC
  Administered 2023-01-22: 100 mg via ORAL
  Filled 2023-01-22: qty 1

## 2023-01-22 MED ORDER — BIOTENE DRY MOUTH MT LIQD: 15.0000 mL | OROMUCOSAL | Status: DC | PRN

## 2023-01-22 MED ORDER — SALINE SPRAY 0.65 % NA SOLN
1.0000 | NASAL | Status: DC | PRN
  Administered 2023-01-22: 1 via NASAL
  Filled 2023-01-22: qty 44

## 2023-01-22 MED ORDER — LORATADINE 10 MG PO TABS
10.0000 mg | ORAL_TABLET | Freq: Every day | ORAL | Status: DC
  Administered 2023-01-22 – 2023-01-24 (×3): 10 mg via ORAL
  Filled 2023-01-22 (×3): qty 1

## 2023-01-22 MED ORDER — GUAIFENESIN 100 MG/5ML PO LIQD
15.0000 mL | Freq: Four times a day (QID) | ORAL | Status: DC
  Administered 2023-01-22 – 2023-01-24 (×6): 15 mL via ORAL
  Filled 2023-01-22 (×6): qty 20

## 2023-01-22 MED ORDER — LACTATED RINGERS IV SOLN: Freq: Once | INTRAVENOUS | Status: AC

## 2023-01-22 NOTE — Progress Notes (Addendum)
        I got paged by the floor nurse for [name] due to concerns for a BP of  87/56 at 1:00am  This is a 77 year old female with a hx of traumatic quadriplegia, Erin,hypothyroidism and complete heart block  s/p pacemaker placement who presented with lethargy and confusion,admitted for acute encephalopathy 2/2 UTI on Augmentin and Bactrim.  Patient was assessed at bedside. She was sleeping comfortably in bed on a 2L [place] sating well in the high 90s. Patient was finally  awaken after multiple attempts to wake her up. She was oriented to person and time but not to place. Patient said she was doing okay and didn't have any complaints.  Because she is actively being managed for an infection,  I wondered if her infection was worsening so I ordered  Lactic acid and a stat CXR. It seemed to me she was volume down so gave her 1L bolus of LR. Mild crackles was appreciated at her lung upper lobe but her CXR looked improved from 9/28.I also got a VBG to make sure she ws not retaining CO2. Patient's BP went up 118/66 prior to leaving the bedside.     At 2:30 am Patients BP went back down to 88/54 despite the 1L bolus IV LR.I saw patient at bedside again.Her mentation and state had not change from 2hrs ago. Lactic acid and VBG  still pending. I gave the patient another 1L LR bolus. I will recheck the BP after the IVF. Patient's Oxygen was turned up to 7L. I turned it back down to 2L and patient was sating at 93.         Signed: Kathleen Lime, MD  Internal Medicine Resident Pager: [phone] 01/22/2023, 1:39 AM
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
## 2023-01-23 ENCOUNTER — Encounter: Payer: PPO | Admitting: Occupational Therapy

## 2023-01-23 ENCOUNTER — Inpatient Hospital Stay (HOSPITAL_COMMUNITY): Payer: PPO

## 2023-01-23 DIAGNOSIS — A419 Sepsis, unspecified organism: Secondary | ICD-10-CM | POA: Diagnosis not present

## 2023-01-23 DIAGNOSIS — G9340 Encephalopathy, unspecified: Secondary | ICD-10-CM | POA: Diagnosis not present

## 2023-01-23 DIAGNOSIS — N390 Urinary tract infection, site not specified: Secondary | ICD-10-CM | POA: Diagnosis not present

## 2023-01-23 LAB — CBC
HCT: 36.1 % (ref 36.0–46.0)
Hemoglobin: 11.6 g/dL — ABNORMAL LOW (ref 12.0–15.0)
MCH: 29.1 pg (ref 26.0–34.0)
MCHC: 32.1 g/dL (ref 30.0–36.0)
MCV: 90.7 fL (ref 80.0–100.0)
Platelets: 114 10*3/uL — ABNORMAL LOW (ref 150–400)
RBC: 3.98 MIL/uL (ref 3.87–5.11)
RDW: 13.6 % (ref 11.5–15.5)
WBC: 5.2 10*3/uL (ref 4.0–10.5)
nRBC: 0 % (ref 0.0–0.2)

## 2023-01-23 LAB — BASIC METABOLIC PANEL
Anion gap: 8 (ref 5–15)
BUN: 9 mg/dL (ref 8–23)
CO2: 28 mmol/L (ref 22–32)
Calcium: 8.2 mg/dL — ABNORMAL LOW (ref 8.9–10.3)
Chloride: 100 mmol/L (ref 98–111)
Creatinine, Ser: 0.41 mg/dL — ABNORMAL LOW (ref 0.44–1.00)
GFR, Estimated: >60 mL/min (ref >60)
Glucose, Bld: 103 mg/dL — ABNORMAL HIGH (ref 70–99)
Potassium: 4.1 mmol/L (ref 3.5–5.1)
Sodium: 136 mmol/L (ref 135–145)

## 2023-01-23 LAB — CULTURE, BLOOD (ROUTINE X 2)
Culture: NO GROWTH
Culture: NO GROWTH
Special Requests: ADEQUATE

## 2023-01-23 LAB — CORTISOL-AM, BLOOD: Cortisol - AM: 10.6 ug/dL (ref 6.7–22.6)

## 2023-01-23 MED ORDER — MIDODRINE HCL 5 MG PO TABS
5.0000 mg | ORAL_TABLET | Freq: Once | ORAL | Status: AC
  Administered 2023-01-23: 5 mg via ORAL
  Filled 2023-01-23: qty 1

## 2023-01-23 MED ORDER — FOOD THICKENER (SIMPLYTHICK)
1.0000 | ORAL | Status: DC | PRN
  Administered 2023-01-23 (×2): 1 via ORAL

## 2023-01-23 MED ORDER — TRAZODONE HCL 100 MG PO TABS
100.0000 mg | ORAL_TABLET | Freq: Every day | ORAL | Status: DC
  Administered 2023-01-23: 100 mg via ORAL
  Filled 2023-01-23: qty 1

## 2023-01-23 NOTE — Progress Notes (Signed)
1248 patient sp02 dropped to 88 % on room air
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
## 2023-01-23 NOTE — Plan of Care (Signed)
  Problem: Clinical Measurements: Goal: Ability to maintain clinical measurements within normal limits will improve Outcome: Progressing   Problem: Clinical Measurements: Goal: Diagnostic test results will improve Outcome: Progressing   Problem: Clinical Measurements: Goal: Respiratory complications will improve Outcome: Progressing   

## 2023-01-23 NOTE — Progress Notes (Signed)
Speech Language Pathology Treatment: Dysphagia  Patient Details Name: [name] MRN: [id number] DOB: 1945/07/12 Today's Date: 01/23/2023 Time: 5366-4403 SLP Time Calculation (min) (ACUTE ONLY): 18 min  Assessment / Plan / Recommendation Clinical Impression  Pt was seen for skilled ST following MBS yesterday.  Pt was encountered awake/alert in  bed with caretaker and family at bedside.  SLP reviewed results of MBS and recommendations including Dysphagia 3 solids and nectar-thick liquids with strict adherence to aspiration precautions.  Compensatory strategies were extensively reviewed with the pt and family/caretaker.  Pt was then seen with nectar-thick liquid via straw.  She consumed 3 sips and then was cued to cough.  Congested cough was noted with mobilization of phlegm to oral cavity for suction.  Pt politely refused solids on this date.  SLP will continue to f/u to monitor diet tolerance and for additional education as necessary.     HPI HPI: [name] is a 77 y.o. female with a pertinent PMH of st-traumatic quadriplegia, multiple sclerosis, hypothyroidism, and history of complete heart block s/p pacemaker placement who presented with confusion and lethargy and is admitted for acute encephalopathy likely secondary to a UTI. Chest XR unremarkable for acute cardiopulmonary disease. On 10/1 developed worsening chest congestion with CXR showing LLL opacity, worsened from prior and redemonstrated RLL opacity. MD concerned for silent aspiration and MBS ordered.      SLP Plan  Continue with current plan of care      Recommendations for follow up therapy are one component of a multi-disciplinary discharge planning process, led by the attending physician.  Recommendations may be updated based on patient status, additional functional criteria and insurance authorization.    Recommendations  Diet recommendations: Nectar-thick liquid;Dysphagia 3 (mechanical soft) Liquids provided via:  Straw Medication Administration: Crushed with puree Supervision: Staff to assist with self feeding Compensations: Slow rate;Small sips/bites Postural Changes and/or Swallow Maneuvers: Seated upright 90 degrees                  Oral care BID   Intermittent Supervision/Assistance Dysphagia, oral phase (R13.11)     Continue with current plan of care    Eino Farber, M.S., CCC-SLP Acute Rehabilitation Services Office: [phone]  Shanon Rosser [facility]  01/23/2023, 10:36 AM

## 2023-01-23 NOTE — TOC Progression Note (Signed)
Transition of Care [facility]) - Progression Note    Patient Details  Name: [name] MRN: [id number] Date of Birth: December 21, 1945  Transition of Care [facility]) CM/SW Contact  Leone Haven, RN Phone Number: 01/23/2023, 12:49 PM  Clinical Narrative:    Patient will need home oxygen, NCM spoke with daughter , Angelique Blonder, she has no preference of DME agency.  NCM made referral to [facility] with [employer].     Expected Discharge Plan: Home w Home Health Services Barriers to Discharge: Continued Medical Work up  Expected Discharge Plan and Services In-house Referral: NA Discharge Planning Services: CM Consult Post Acute Care Choice: Resumption of Svcs/PTA Provider, Home Health Living arrangements for the past 2 months: Single Family Home                 DME Arranged: N/A DME Agency: NA                   Social Determinants of Health (SDOH) Interventions SDOH Screenings   Food Insecurity: No Food Insecurity (04/29/2022)   Received from [facility], [facility]  Transportation Needs: No Transportation Needs (06/10/2022)   Received from [facility], [facility]  Utilities: Not At Risk (04/29/2022)   Received from [facility], [facility]  Financial Resource Strain: Low Risk  (04/29/2022)   Received from [facility], [facility]  Physical Activity: Unknown (04/29/2022)   Received from [facility], [facility]  Social Connections: Moderately Integrated (04/29/2022)   Received from [facility], [facility]  Stress: No Stress Concern Present (12/24/2022)   Received from [facility]  Tobacco Use: Low Risk  (01/18/2023)    Readmission Risk Interventions     No data to display
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
## 2023-01-24 ENCOUNTER — Other Ambulatory Visit (HOSPITAL_COMMUNITY): Payer: Self-pay

## 2023-01-24 DIAGNOSIS — N390 Urinary tract infection, site not specified: Secondary | ICD-10-CM | POA: Diagnosis not present

## 2023-01-24 DIAGNOSIS — G9340 Encephalopathy, unspecified: Secondary | ICD-10-CM | POA: Diagnosis not present

## 2023-01-24 DIAGNOSIS — A419 Sepsis, unspecified organism: Secondary | ICD-10-CM | POA: Diagnosis not present

## 2023-01-24 LAB — BASIC METABOLIC PANEL
Anion gap: 6 (ref 5–15)
BUN: 7 mg/dL — ABNORMAL LOW (ref 8–23)
CO2: 31 mmol/L (ref 22–32)
Calcium: 8 mg/dL — ABNORMAL LOW (ref 8.9–10.3)
Chloride: 97 mmol/L — ABNORMAL LOW (ref 98–111)
Creatinine, Ser: 0.33 mg/dL — ABNORMAL LOW (ref 0.44–1.00)
GFR, Estimated: >60 mL/min (ref >60)
Glucose, Bld: 84 mg/dL (ref 70–99)
Potassium: 3.8 mmol/L (ref 3.5–5.1)
Sodium: 134 mmol/L — ABNORMAL LOW (ref 135–145)

## 2023-01-24 LAB — CBC
HCT: 35.2 % — ABNORMAL LOW (ref 36.0–46.0)
Hemoglobin: 11.4 g/dL — ABNORMAL LOW (ref 12.0–15.0)
MCH: 29.4 pg (ref 26.0–34.0)
MCHC: 32.4 g/dL (ref 30.0–36.0)
MCV: 90.7 fL (ref 80.0–100.0)
Platelets: 121 10*3/uL — ABNORMAL LOW (ref 150–400)
RBC: 3.88 MIL/uL (ref 3.87–5.11)
RDW: 13.5 % (ref 11.5–15.5)
WBC: 5.7 10*3/uL (ref 4.0–10.5)
nRBC: 0 % (ref 0.0–0.2)

## 2023-01-24 LAB — CORTISOL-AM, BLOOD: Cortisol - AM: 8.5 ug/dL (ref 6.7–22.6)

## 2023-01-24 MED ORDER — FOOD THICKENER (SIMPLYTHICK)
1.0000 | ORAL | 0 refills | Status: AC | PRN
  Filled 2023-01-24: qty 30, 30d supply, fill #0

## 2023-01-24 MED ORDER — BACLOFEN 20 MG PO TABS: 20.0000 mg | ORAL_TABLET | Freq: Three times a day (TID) | ORAL | Status: DC

## 2023-01-24 MED ORDER — AMOXICILLIN-POT CLAVULANATE 875-125 MG PO TABS: 1.0000 | ORAL_TABLET | Freq: Two times a day (BID) | ORAL | Status: DC

## 2023-01-24 MED ORDER — SULFAMETHOXAZOLE-TRIMETHOPRIM 800-160 MG PO TABS
1.0000 | ORAL_TABLET | Freq: Two times a day (BID) | ORAL | 0 refills | Status: AC
  Filled 2023-01-24: qty 11, 6d supply, fill #0

## 2023-01-24 MED ORDER — AMOXICILLIN-POT CLAVULANATE 875-125 MG PO TABS
1.0000 | ORAL_TABLET | Freq: Two times a day (BID) | ORAL | 0 refills | Status: AC
  Filled 2023-01-24: qty 11, 6d supply, fill #0

## 2023-01-24 MED ORDER — GUAIFENESIN 100 MG/5ML PO LIQD
15.0000 mL | Freq: Four times a day (QID) | ORAL | 0 refills | Status: DC
  Filled 2023-01-24: qty 118, 2d supply, fill #0

## 2023-01-24 MED ORDER — SULFAMETHOXAZOLE-TRIMETHOPRIM 800-160 MG PO TABS: 1.0000 | ORAL_TABLET | Freq: Two times a day (BID) | ORAL | Status: DC

## 2023-01-24 NOTE — TOC Transition Note (Signed)
Transition of Care [facility]) - CM/SW Discharge Note   Patient Details  Name: [name] MRN: [id number] Date of Birth: June 05, 1945  [facility]) CM/SW Contact:  Leone Haven, RN Phone Number: 01/24/2023, 11:43 AM   Clinical Narrative:    For dc today, has oxygen supplied by [employer].     Final next level of care: Home w Home Health Services Barriers to Discharge: Continued Medical Work up   Patient Goals and CMS Choice      Discharge Placement                         Discharge Plan and Services Additional resources added to the After Visit Summary for   In-house Referral: NA Discharge Planning Services: CM Consult Post Acute Care Choice: Resumption of Svcs/PTA Provider, Home Health          DME Arranged: N/A DME Agency: NA                  Social Determinants of Health (SDOH) Interventions SDOH Screenings   Food Insecurity: No Food Insecurity (04/29/2022)   Received from [facility], [facility]  Transportation Needs: No Transportation Needs (06/10/2022)   Received from [facility], [facility]  Utilities: Not At Risk (04/29/2022)   Received from [facility], [facility]  Financial Resource Strain: Low Risk  (04/29/2022)   Received from [facility], [facility]  Physical Activity: Unknown (04/29/2022)   Received from [facility], [facility]  Social Connections: Moderately Integrated (04/29/2022)   Received from [facility], [facility]  Stress: No Stress Concern Present (12/24/2022)   Received from [facility]  Tobacco Use: Low Risk  (01/18/2023)     Readmission Risk Interventions     No data to display

## 2023-01-24 NOTE — Progress Notes (Signed)
Mobility Specialist Progress Note:   01/24/23 1112  Mobility  Activity Transferred from bed to chair  Level of Assistance Total care  Assistive Device MaxiMove  Activity Response Tolerated well  $Mobility charge 1 Mobility  Mobility Specialist Start Time (ACUTE ONLY) 1100  Mobility Specialist Stop Time (ACUTE ONLY) 1113  Mobility Specialist Time Calculation (min) (ACUTE ONLY) 13 min   Pt needing to be moved from bed to wheelchair d/t discharge per RN request. No complaints during transition. Pt left in wheelchair with RN and NT present in room.   Leory Plowman  Mobility Specialist Please contact via [employer] office at [phone]

## 2023-01-24 NOTE — Discharge Summary (Addendum)
Name: [name] MRN: [id number] DOB: 1945/08/11 77 y.o. PCP: Ladora Daniel, PA-C  Date of Admission: 01/18/2023 10:38 AM Date of Discharge:  01/24/2023 Attending Physician: Dr. Criselda Peaches  DISCHARGE DIAGNOSIS:  Primary Problem: Acute encephalopathy   Hospital Problems: Principal Problem:   Acute encephalopathy Active Problems:   Catheter-associated urinary tract infection (HCC)    DISCHARGE MEDICATIONS:   Allergies as of 01/24/2023       Reactions   Ceftin [cefuroxime] Swelling   - Pt states she tolerated penicillins in the past - recently tolerated multiple rounds of cephalosporins and amoxicillin    Levofloxacin Anaphylaxis   Tolerated ciprofloxacin 11/2022   Pregabalin Swelling   Bladder problems        Medication List     STOP taking these medications    fosfomycin 3 g Pack Commonly known as: MONUROL   metoprolol succinate 25 MG 24 hr tablet Commonly known as: TOPROL-XL       TAKE these medications    amoxicillin-clavulanate 875-125 MG tablet Commonly known as: AUGMENTIN Take 1 tablet by mouth every 12 (twelve) hours for 11 doses.   ascorbic acid 500 MG tablet Commonly known as: VITAMIN C Take 500 mg by mouth daily.   baclofen 20 MG tablet Commonly known as: LIORESAL TAKE 1 TABLET BY MOUTH 4 TIMES DAILY. What changed: when to take this   bisacodyl 10 MG suppository Commonly known as: DULCOLAX Place 10 mg rectally at bedtime.   cholecalciferol 25 MCG (1000 UNIT) tablet Commonly known as: VITAMIN D3 Take 1,000 Units by mouth daily.   collagenase 250 UNIT/GM ointment Commonly known as: SANTYL Apply 1 Application topically daily.   cyanocobalamin 1000 MCG/ML injection Commonly known as: VITAMIN B12 Inject 1,000 mcg into the muscle every 30 (thirty) days.   DULoxetine 60 MG capsule Commonly known as: CYMBALTA TAKE ONE CAPSULE BY MOUTH EVERY MORNING AFTER MEAL   food thickener Gel Commonly known as: SIMPLYTHICK (NECTAR/LEVEL 2/MILDLY  THICK) Take 1 packet by mouth as needed.   gabapentin 600 MG tablet Commonly known as: NEURONTIN Take 1,200 mg by mouth 3 (three) times daily.   guaiFENesin 100 MG/5ML liquid Commonly known as: ROBITUSSIN Take 15 mLs by mouth 4 (four) times daily.   levocetirizine 5 MG tablet Commonly known as: XYZAL Take 5 mg by mouth every evening.   levothyroxine 25 MCG tablet Commonly known as: SYNTHROID Take 25 mcg by mouth daily before breakfast.   megestrol 625 MG/5ML suspension Commonly known as: MEGACE ES Take 625 mg by mouth daily.   montelukast 10 MG tablet Commonly known as: SINGULAIR Take 10 mg by mouth at bedtime.   multivitamin with minerals tablet Take 1 tablet by mouth daily.   Myrbetriq 50 MG Tb24 tablet Generic drug: mirabegron ER Take 50 mg by mouth daily.   polyethylene glycol 17 g packet Commonly known as: MIRALAX / GLYCOLAX Take 17 g by mouth daily.   solifenacin 5 MG tablet Commonly known as: VESICARE Take 5 mg by mouth daily.   sulfamethoxazole-trimethoprim 800-160 MG tablet Commonly known as: BACTRIM DS Take 1 tablet by mouth every 12 (twelve) hours for 11 doses.   traMADol 50 MG tablet Commonly known as: ULTRAM Take 50 mg by mouth every 6 (six) hours as needed for moderate pain.   traZODone 50 MG tablet Commonly known as: DESYREL Take 100 mg by mouth at bedtime.   VITAMIN D PO Take 5,000 Units by mouth daily.   Voltaren 1 % Gel Generic drug: diclofenac Sodium Apply  2 g topically 4 (four) times daily.               Durable Medical Equipment  (From admission, onward)           Start     Ordered   01/23/23 1429  For home use only DME oxygen  Once       Question Answer Comment  Length of Need 6 Months   Mode or (Route) Nasal cannula   Liters per Minute 2   Oxygen delivery system Gas      01/23/23 1428            DISPOSITION AND FOLLOW-UP:  Ms.Shawndrea Muns was discharged from [facility] in [place]  condition. At the hospital follow up visit please address:  Acute encephalopathy, resolved Sepsis secondary to Urinary Tract Infection: Assess mental status. Ensure completion of Augmentin/Bactrim antibiotic course. Recheck CBC, BMP. Recheck blood pressure.   Aspiration pneumonia Silent aspiration: Assess respiratory status, discharged on 2L [place]. Ensure completion of Augmentin course. Continue education about importance of soft foods and liquid thickeners given silent aspiration to prevent future episodes of pneumonia. Speech therapy orders placed on discharge, please follow up.   Hypotension: Patient seems to have baseline hypotension, especially at night, but it has been asymptomatic. We held her metoprolol and recommend reassessing if she needs it. AM cortisol was 8.5, but this was in the setting of her acute illness. It may be worth working up on an outpatient basis.   Follow-up Recommendations: Labs: CBC, BMP Studies: None Medications: Continue Augmentin and Bactrim for 11 more doses. Mucinex to loose mucus. Metoprolol held.   Follow-up Appointments:  Follow-up Information     [facility] Follow up.   Why: home oxygen Contact information: [phone]        Ladora Daniel, PA-C. Schedule an appointment as soon as possible for a visit.   Specialty: Physician Assistant Contact information: [address] [place] [address] [phone]         Wille Glaser, MD. Schedule an appointment as soon as possible for a visit.   Specialty: Cardiology Contact information: [address] [place] [address] [phone]                 HOSPITAL COURSE:  Patient Summary: [name] is a 77 y.o. person living with a history of post-traumatic quadriplegia, multiple sclerosis, hypothyroidism, and history of complete heart block s/p pacemaker placement who presented with progressive weakness and lethargy and was admitted for acute encephalopathy  secondary to a urinary tract infection and subsequently found to have aspiration pneumonia secondary to silent aspiration.  Acute encephalopathy, resolved Sepsis, secondary to a urinary tract infection, resolved Aspiration pneumonia, secondary to silent aspiration Patient went to the urology office on 9/20 with symptoms of a urinary tract infection, which was confirmed with a urinalysis.  Urine culture at that time demonstrated Citrobacter freundii, susceptible to ertapenem, imipenem, nitrofurantoin, and bactrim.  She started taking Bactrim on 9/25, however her confusion and lethargy continued to worsen.  Urinalysis in the emergency department was also consistent with a UTI.  She was started on aztreonam given concerns for possible cephalosporin allergy.  Urine cultures from admission grew Citrobacter freundii and Enterococcus faecalis.  She was switched to amoxicillin and Bactrim given the bacterial susceptibilities and began to show clinical improvement.  As her encephalopathy was resolving, a speech evaluation was done and she was restarted on a soft diet with meds crushed in  pure. The next day, it was noted her oxygen was dropping on room air, so a chest x-ray was obtained which demonstrated basilar opacities concerning for possible aspiration pneumonia.  A modified barium swallow confirmed silent aspiration, so her diet was changed to thick nectar and her amoxicillin was switched to Augmentin for aspiration pneumonia coverage.  She continued to remain asymptomatic and afebrile.  Of note, she was found to be hypotensive overnight several nights with blood pressure in the 80s/50s, which was treated with fluids and midodrine.  She was asymptomatic during these episodes.  Ultimately, we came to the conclusion that this may be her baseline given her spinal cord injury, as she demonstrated no symptoms or signs of sepsis and it consistently occurred at night. Of note, her AM cortisol was 8.5, but this is in the  setting of an acute illness so she may need further workup of adrenal insufficiency outpatient.  Overall, she has been clinically stable for several days, has remained afebrile, has no leukocytosis, denies any shortness of breath, and her oxygen requirement is down to 2 L nasal cannula, so we feel she is stable for discharge with home health services and oxygen while she completes the remaining 5 days of her antibiotic course.    DISCHARGE INSTRUCTIONS:   Discharge Instructions     Ambulatory referral to Speech Therapy   Complete by: As directed    Per recommendations from SLP eval   If ordering provider is a resident, enter attending physician's name: Dr. Debe Coder   Call MD for:  difficulty breathing, headache or visual disturbances   Complete by: As directed    Call MD for:  extreme fatigue   Complete by: As directed    Call MD for:  persistant dizziness or light-headedness   Complete by: As directed    Call MD for:  persistant nausea and vomiting   Complete by: As directed    Call MD for:  redness, tenderness, or signs of infection (pain, swelling, redness, odor or green/yellow discharge around incision site)   Complete by: As directed    Call MD for:  severe uncontrolled pain   Complete by: As directed    Diet - low sodium heart healthy   Complete by: As directed    Discharge instructions   Complete by: As directed    You were hospitalized for a urinary tract infection and pneumonia. Thank you for allowing [place] to be part of your care. You are being treated with antibiotics and you are now medically stable for discharge.   Please note these changes made to your medications:   Please CONTINUE taking: Augmentin 875-125 mg twice a day for 5 days and Bactrim twice a day for 5 days, to complete a 10 day course for your UTI and pneumonia. You have already received your morning dose, so please begin by taking your second dose of the day tonight.   We added mucinex to help loosen up  your mucus and chest congestion.   We are sending you home with 2 Liters of oxygen by nasal cannula, which we recommend using while you recover from your pneumonia.   Please continue to eat a soft diet at home to prevent aspiration. Please buy a food/beverage thickener over the counter like ThickIt to thicken up the liquids to help prevent further aspiration.   Please STOP taking: metoprolol and fosfomycin. Please follow up with your PCP/Cardiologist about whether to restart the metoprolol. We have stopped it for now given  your low blood pressure.   You may continue taking your other medications as prescribed.   Please make sure to follow up with your PCP, Ladora Daniel, PA-C, in the next week or two. Please also follow up with your cardiologist at the next available appointment.   Increase activity slowly   Complete by: As directed        SUBJECTIVE:   Patient was evaluated at bedside. Doing well. On 2L [place] with O2 in low 90s, which seems to be baseline. Denies shortness of breath, no new concerns. Reports coughing up some phlegm last night.   Discharge Vitals:   BP 111/66 (BP Location: Left Arm)   Pulse 82   Temp 98.8 F (37.1 C) (Oral)   Resp 16   Ht 5\' 5"  (1.651 m)   Wt 59.2 kg   SpO2 92%   BMI 21.72 kg/m   OBJECTIVE:  Physical Exam HENT:     Head: Normocephalic and atraumatic.     Nose: Nose normal.     Mouth/Throat:     Mouth: Mucous membranes are moist.  Eyes:     Extraocular Movements: Extraocular movements intact.     Pupils: Pupils are equal, round, and reactive to light.  Neck:     Comments: Secondary to neck hardware Cardiovascular:     Rate and Rhythm: Normal rate and regular rhythm.     Pulses: Normal pulses.     Heart sounds: Normal heart sounds.  Pulmonary:     Effort: Pulmonary effort is normal.     Breath sounds: Normal breath sounds.  Abdominal:     General: Abdomen is flat.     Palpations: Abdomen is soft.  Musculoskeletal:     Cervical back:  Rigidity present.     Comments: quadriplegic  Neurological:     Mental Status: She is alert and oriented to person, place, and time. Mental status is at baseline.     Pertinent Labs, Studies, and Procedures:     Latest Ref Rng & Units 01/24/2023    3:51 AM 01/23/2023   11:52 AM 01/22/2023    3:54 AM  CBC  WBC 4.0 - 10.5 K/uL 5.7  5.2  8.5   Hemoglobin 12.0 - 15.0 g/dL 78.2  95.6  21.3   Hematocrit 36.0 - 46.0 % 35.2  36.1  37.5   Platelets 150 - 400 K/uL 121  114  109        Latest Ref Rng & Units 01/24/2023    3:51 AM 01/23/2023   11:52 AM 01/22/2023    3:54 AM  CMP  Glucose 70 - 99 mg/dL 84  086  81   BUN 8 - 23 mg/dL 7  9  10    Creatinine 0.44 - 1.00 mg/dL 5.78  4.69  6.29   Sodium 135 - 145 mmol/L 134  136  134   Potassium 3.5 - 5.1 mmol/L 3.8  4.1  4.5   Chloride 98 - 111 mmol/L 97  100  103   CO2 22 - 32 mmol/L 31  28  24    Calcium 8.9 - 10.3 mg/dL 8.0  8.2  8.0     DG Chest 2 View Result Date: 01/18/2023 CLINICAL DATA:  Suspected sepsis EXAM: CHEST - 2 VIEW COMPARISON:  01/17/2021 FINDINGS: Cardiomegaly with left chest multi lead pacer. Both lungs are clear. The visualized skeletal structures are unremarkable. IMPRESSION: 1. No acute abnormality of the lungs in AP portable projection. 2. Cardiomegaly with left chest multi lead  pacer. Electronically Signed   By: Jearld Lesch M.D.   On: 01/18/2023 13:14   Chest X-ray: 01/22/2023 PORTABLE CHEST 1 VIEW IMPRESSION: Cardiac enlargement. Bilateral basilar infiltration or atelectasis, greater on the left, and with improvement on the right since previous study. Probable left pleural effusion.    Signed: Annett Fabian, MD Internal Medicine Resident, PGY-1 [facility]  Pager: [phone] 9:59 AM, 01/24/2023

## 2023-01-24 NOTE — Plan of Care (Signed)
  Problem: Health Behavior/Discharge Planning: Goal: Ability to manage health-related needs will improve Outcome: Progressing   Problem: Clinical Measurements: Goal: Will remain free from infection Outcome: Progressing   Problem: Clinical Measurements: Goal: Diagnostic test results will improve Outcome: Progressing   

## 2023-01-27 LAB — CULTURE, BLOOD (ROUTINE X 2)
Culture: NO GROWTH
Culture: NO GROWTH
Special Requests: ADEQUATE

## 2023-02-11 DIAGNOSIS — I480 Paroxysmal atrial fibrillation: Secondary | ICD-10-CM | POA: Diagnosis present

## 2023-02-12 ENCOUNTER — Ambulatory Visit: Payer: PPO | Admitting: Neurology

## 2023-07-20 ENCOUNTER — Other Ambulatory Visit: Payer: Self-pay | Admitting: Neurology

## 2023-08-01 ENCOUNTER — Emergency Department (HOSPITAL_COMMUNITY)

## 2023-08-01 ENCOUNTER — Other Ambulatory Visit: Payer: Self-pay

## 2023-08-01 ENCOUNTER — Encounter (HOSPITAL_COMMUNITY): Payer: Self-pay

## 2023-08-01 ENCOUNTER — Inpatient Hospital Stay (HOSPITAL_COMMUNITY)
Admission: EM | Admit: 2023-08-01 | Discharge: 2023-08-04 | DRG: 689 | Disposition: A | Discharge status: Home / Self Care | Attending: Family Medicine | Admitting: Family Medicine

## 2023-08-01 DIAGNOSIS — N390 Urinary tract infection, site not specified: Secondary | ICD-10-CM | POA: Diagnosis not present

## 2023-08-01 DIAGNOSIS — R400 Somnolence: Secondary | ICD-10-CM | POA: Diagnosis not present

## 2023-08-01 DIAGNOSIS — L89611 Pressure ulcer of right heel, stage 1: Secondary | ICD-10-CM | POA: Diagnosis present

## 2023-08-01 DIAGNOSIS — N319 Neuromuscular dysfunction of bladder, unspecified: Secondary | ICD-10-CM | POA: Diagnosis present

## 2023-08-01 DIAGNOSIS — J690 Pneumonitis due to inhalation of food and vomit: Secondary | ICD-10-CM | POA: Diagnosis present

## 2023-08-01 DIAGNOSIS — Z9359 Other cystostomy status: Secondary | ICD-10-CM

## 2023-08-01 DIAGNOSIS — Z9884 Bariatric surgery status: Secondary | ICD-10-CM

## 2023-08-01 DIAGNOSIS — G35 Multiple sclerosis: Secondary | ICD-10-CM | POA: Diagnosis present

## 2023-08-01 DIAGNOSIS — R4182 Altered mental status, unspecified: Secondary | ICD-10-CM | POA: Diagnosis present

## 2023-08-01 DIAGNOSIS — Z789 Other specified health status: Secondary | ICD-10-CM

## 2023-08-01 DIAGNOSIS — Z9889 Other specified postprocedural states: Secondary | ICD-10-CM

## 2023-08-01 DIAGNOSIS — Z79890 Hormone replacement therapy (postmenopausal): Secondary | ICD-10-CM

## 2023-08-01 DIAGNOSIS — T17900A Unspecified foreign body in respiratory tract, part unspecified causing asphyxiation, initial encounter: Secondary | ICD-10-CM | POA: Insufficient documentation

## 2023-08-01 DIAGNOSIS — E039 Hypothyroidism, unspecified: Secondary | ICD-10-CM | POA: Diagnosis present

## 2023-08-01 DIAGNOSIS — G9349 Other encephalopathy: Secondary | ICD-10-CM | POA: Diagnosis present

## 2023-08-01 DIAGNOSIS — L8990 Pressure ulcer of unspecified site, unspecified stage: Secondary | ICD-10-CM | POA: Diagnosis present

## 2023-08-01 DIAGNOSIS — L89621 Pressure ulcer of left heel, stage 1: Secondary | ICD-10-CM | POA: Diagnosis present

## 2023-08-01 DIAGNOSIS — Z8701 Personal history of pneumonia (recurrent): Secondary | ICD-10-CM

## 2023-08-01 DIAGNOSIS — Z79899 Other long term (current) drug therapy: Secondary | ICD-10-CM

## 2023-08-01 DIAGNOSIS — G8250 Quadriplegia, unspecified: Secondary | ICD-10-CM | POA: Diagnosis present

## 2023-08-01 DIAGNOSIS — Z87440 Personal history of urinary (tract) infections: Secondary | ICD-10-CM

## 2023-08-01 DIAGNOSIS — Z950 Presence of cardiac pacemaker: Secondary | ICD-10-CM

## 2023-08-01 DIAGNOSIS — Z90710 Acquired absence of both cervix and uterus: Secondary | ICD-10-CM

## 2023-08-01 DIAGNOSIS — Z9981 Dependence on supplemental oxygen: Secondary | ICD-10-CM

## 2023-08-01 LAB — AMMONIA: Ammonia: 20 umol/L (ref 9–35)

## 2023-08-01 LAB — COMPREHENSIVE METABOLIC PANEL WITH GFR
ALT: 23 U/L (ref 0–44)
AST: 35 U/L (ref 15–41)
Albumin: 3.2 g/dL — ABNORMAL LOW (ref 3.5–5.0)
Alkaline Phosphatase: 50 U/L (ref 38–126)
Anion gap: 14 (ref 5–15)
BUN: 19 mg/dL (ref 8–23)
CO2: 24 mmol/L (ref 22–32)
Calcium: 8.9 mg/dL (ref 8.9–10.3)
Chloride: 101 mmol/L (ref 98–111)
Creatinine, Ser: 0.4 mg/dL — ABNORMAL LOW (ref 0.44–1.00)
GFR, Estimated: >60 mL/min (ref >60)
Glucose, Bld: 107 mg/dL — ABNORMAL HIGH (ref 70–99)
Potassium: 4.7 mmol/L (ref 3.5–5.1)
Sodium: 139 mmol/L (ref 135–145)
Total Bilirubin: 1.4 mg/dL — ABNORMAL HIGH (ref 0.0–1.2)
Total Protein: 6.2 g/dL — ABNORMAL LOW (ref 6.5–8.1)

## 2023-08-01 LAB — CBC WITH DIFFERENTIAL/PLATELET
Abs Immature Granulocytes: 0.09 10*3/uL — ABNORMAL HIGH (ref 0.00–0.07)
Basophils Absolute: 0.1 10*3/uL (ref 0.0–0.1)
Basophils Relative: 0 %
Eosinophils Absolute: 0.2 10*3/uL (ref 0.0–0.5)
Eosinophils Relative: 2 %
HCT: 45.9 % (ref 36.0–46.0)
Hemoglobin: 14.9 g/dL (ref 12.0–15.0)
Immature Granulocytes: 1 %
Lymphocytes Relative: 14 %
Lymphs Abs: 1.6 10*3/uL (ref 0.7–4.0)
MCH: 30.8 pg (ref 26.0–34.0)
MCHC: 32.5 g/dL (ref 30.0–36.0)
MCV: 95 fL (ref 80.0–100.0)
Monocytes Absolute: 0.5 10*3/uL (ref 0.1–1.0)
Monocytes Relative: 4 %
Neutro Abs: 9.3 10*3/uL — ABNORMAL HIGH (ref 1.7–7.7)
Neutrophils Relative %: 79 %
Platelets: 242 10*3/uL (ref 150–400)
RBC: 4.83 MIL/uL (ref 3.87–5.11)
RDW: 15.7 % — ABNORMAL HIGH (ref 11.5–15.5)
WBC: 11.8 10*3/uL — ABNORMAL HIGH (ref 4.0–10.5)
nRBC: 0 % (ref 0.0–0.2)

## 2023-08-01 LAB — URINALYSIS, W/ REFLEX TO CULTURE (INFECTION SUSPECTED)
Bilirubin Urine: NEGATIVE
Glucose, UA: NEGATIVE mg/dL
Hgb urine dipstick: NEGATIVE
Ketones, ur: NEGATIVE mg/dL
Nitrite: POSITIVE — AB
Protein, ur: NEGATIVE mg/dL
Specific Gravity, Urine: 1.012 (ref 1.005–1.030)
WBC, UA: >50 WBC/hpf (ref 0–5)
pH: 5 (ref 5.0–8.0)

## 2023-08-01 LAB — LIPASE, BLOOD: Lipase: 22 U/L (ref 11–51)

## 2023-08-01 LAB — TSH: TSH: 2.52 u[IU]/mL (ref 0.350–4.500)

## 2023-08-01 LAB — I-STAT CG4 LACTIC ACID, ED: Lactic Acid, Venous: 1.3 mmol/L (ref 0.5–1.9)

## 2023-08-01 MED ORDER — MEROPENEM 1 G IV SOLR
1.0000 g | Freq: Three times a day (TID) | INTRAVENOUS | Status: DC
  Administered 2023-08-01 – 2023-08-04 (×9): 1 g via INTRAVENOUS
  Filled 2023-08-01 (×9): qty 20

## 2023-08-01 MED ORDER — SODIUM CHLORIDE 0.9 % IV SOLN
1.0000 g | Freq: Once | INTRAVENOUS | Status: AC
  Administered 2023-08-01: 1 g via INTRAVENOUS
  Filled 2023-08-01: qty 10

## 2023-08-01 MED ORDER — ACETAMINOPHEN 10 MG/ML IV SOLN: 1000.0000 mg | Freq: Four times a day (QID) | INTRAVENOUS | Status: AC | PRN

## 2023-08-01 MED ORDER — ENOXAPARIN SODIUM 40 MG/0.4ML IJ SOSY
40.0000 mg | PREFILLED_SYRINGE | INTRAMUSCULAR | Status: DC
  Administered 2023-08-01 – 2023-08-03 (×3): 40 mg via SUBCUTANEOUS
  Filled 2023-08-01 (×3): qty 0.4

## 2023-08-01 NOTE — ED Notes (Signed)
 Pt has been taking abx for the two weeks. Daughter called provider to state that she isn't getting better. Pt has been more sleepy than normal for the past two days.

## 2023-08-01 NOTE — Assessment & Plan Note (Signed)
 Patient admitted in October 2024 for sepsis secondary to UTI.  During admission, found to have silent aspiration with aspiration pneumonia.  Patient receives all medications crushed with applesauce.  Patient discharged on 2L [place]. Given altered mental status and history of silent aspiration, will order SLP eval.  Patient n.p.o. for now - NPO pending SLP eval - 2L [place] at night for comfort  - If respiratory status worsens, consider Chest CT to r/o  developing aspiration pneumonitis vs pneumonia

## 2023-08-01 NOTE — ED Provider Notes (Signed)
 [place] EMERGENCY DEPARTMENT AT [facility] Provider Note   CSN: [id number] Arrival date & time: 08/01/23  1405     History  Chief Complaint  Patient presents with   Weakness   Fatigue    [name] is a 78 y.o. female.  The history is provided by the EMS personnel, medical records and a caregiver. The history is limited by the condition of the patient. No language interpreter was used.  Weakness Severity:  Moderate Onset quality:  Gradual Duration:  1 week Timing:  Constant Progression:  Worsening Chronicity:  Recurrent Context: urinary tract infection   Relieved by:  Nothing Worsened by:  Nothing Ineffective treatments:  None tried Associated symptoms: cough and foul-smelling urine   Associated symptoms: no abdominal pain, no chest pain, no diarrhea, no dysuria, no falls, no fever, no headaches, no loss of consciousness, no nausea, no shortness of breath and no vomiting        Home Medications Prior to Admission medications   Medication Sig Start Date End Date Taking? Authorizing Provider  baclofen (LIORESAL) 20 MG tablet Take 1 tablet (20 mg total) by mouth 3 (three) times daily. 01/24/23   Annett Fabian, MD  bisacodyl (DULCOLAX) 10 MG suppository Place 10 mg rectally at bedtime.    [provider]  cholecalciferol (VITAMIN D3) 25 MCG (1000 UNIT) tablet Take 1,000 Units by mouth daily.    [provider]  collagenase (SANTYL) 250 UNIT/GM ointment Apply 1 Application topically daily.    [provider]  cyanocobalamin (,VITAMIN B-12,) 1000 MCG/ML injection Inject 1,000 mcg into the muscle every 30 (thirty) days. 05/14/19   [provider]  diclofenac Sodium (VOLTAREN) 1 % GEL Apply 2 g topically 4 (four) times daily.    [provider]  DULoxetine (CYMBALTA) 60 MG capsule TAKE ONE CAPSULE BY MOUTH EVERY MORNING AFTER MEAL 07/21/23   Levert Feinstein, MD  gabapentin (NEURONTIN) 600 MG tablet Take 1,200 mg by  mouth 3 (three) times daily. 03/02/19   [provider]  guaiFENesin (ROBITUSSIN) 100 MG/5ML liquid Take 15 mLs by mouth 4 (four) times daily. 01/24/23   Annett Fabian, MD  levocetirizine (XYZAL) 5 MG tablet Take 5 mg by mouth every evening. 04/27/19   [provider]  levothyroxine (SYNTHROID) 25 MCG tablet Take 25 mcg by mouth daily before breakfast. 04/25/19   [provider]  megestrol (MEGACE ES) 625 MG/5ML suspension Take 625 mg by mouth daily.    [provider]  mirabegron ER (MYRBETRIQ) 50 MG TB24 tablet Take 50 mg by mouth daily.    [provider]  montelukast (SINGULAIR) 10 MG tablet Take 10 mg by mouth at bedtime. 06/26/20   [provider]  Multiple Vitamins-Minerals (MULTIVITAMIN WITH MINERALS) tablet Take 1 tablet by mouth daily.    [provider]  polyethylene glycol (MIRALAX / GLYCOLAX) 17 g packet Take 17 g by mouth daily.    [provider]  solifenacin (VESICARE) 5 MG tablet Take 5 mg by mouth daily.    [provider]  traMADol (ULTRAM) 50 MG tablet Take 50 mg by mouth every 6 (six) hours as needed for moderate pain.    [provider]  traZODone (DESYREL) 50 MG tablet Take 100 mg by mouth at bedtime. 05/12/19   [provider]  vitamin C (ASCORBIC ACID) 500 MG tablet Take 500 mg by mouth daily.    [provider]  VITAMIN D PO Take 5,000 Units by mouth daily.  [provider]      Allergies    Ceftin [cefuroxime], Levofloxacin, and Pregabalin    Review of Systems   Review of Systems  Unable to perform ROS: Mental status change  Constitutional:  Positive for fatigue. Negative for chills and fever.  HENT:  Positive for congestion.   Respiratory:  Positive for cough. Negative for chest tightness and shortness of breath.   Cardiovascular:  Negative for chest pain and leg swelling.  Gastrointestinal:  Negative for abdominal pain, diarrhea, nausea and vomiting.   Genitourinary:  Negative for dysuria.  Musculoskeletal:  Negative for falls and neck pain.  Skin:  Negative for rash and wound.  Neurological:  Negative for loss of consciousness and headaches.  Psychiatric/Behavioral:  Positive for confusion (AMS compared to baseline per caregiver). Negative for agitation.     Physical Exam Updated Vital Signs BP 111/69   Pulse 70   Temp 98.1 F (36.7 C) (Oral)   Resp 11   Ht 5' 4.5" (1.638 m)   Wt 68 kg   SpO2 96%   BMI 25.35 kg/m  Physical Exam Vitals and nursing note reviewed.  Constitutional:      General: She is not in acute distress.    Appearance: She is well-developed. She is ill-appearing. She is not toxic-appearing or diaphoretic.  HENT:     Head: Normocephalic and atraumatic.     Nose: No rhinorrhea.     Mouth/Throat:     Mouth: Mucous membranes are dry.     Pharynx: No oropharyngeal exudate or posterior oropharyngeal erythema.  Eyes:     Conjunctiva/sclera: Conjunctivae normal.     Pupils: Pupils are equal, round, and reactive to light.  Cardiovascular:     Rate and Rhythm: Normal rate and regular rhythm.     Heart sounds: No murmur heard. Pulmonary:     Effort: Pulmonary effort is normal. No respiratory distress.     Breath sounds: Rhonchi present. No wheezing or rales.  Chest:     Chest wall: No tenderness.  Abdominal:     Palpations: Abdomen is soft.     Tenderness: There is no abdominal tenderness. There is no guarding or rebound.  Musculoskeletal:        General: No swelling or tenderness.     Cervical back: Neck supple. No tenderness.     Right lower leg: No edema.     Left lower leg: No edema.  Skin:    General: Skin is warm and dry.     Capillary Refill: Capillary refill takes less than 2 seconds.     Findings: No erythema or rash.     ED Results / Procedures / Treatments   Labs (all labs ordered are listed, but only abnormal results are displayed) Labs Reviewed  CBC WITH DIFFERENTIAL/PLATELET -  Abnormal; Notable for the following components:      Result Value   WBC 11.8 (*)    RDW 15.7 (*)    Neutro Abs 9.3 (*)    Abs Immature Granulocytes 0.09 (*)    All other components within normal limits  COMPREHENSIVE METABOLIC PANEL WITH GFR - Abnormal; Notable for the following components:   Glucose, Bld 107 (*)    Creatinine, Ser 0.40 (*)    Total Protein 6.2 (*)    Albumin 3.2 (*)    Total Bilirubin 1.4 (*)    All other components within normal limits  URINALYSIS, W/ REFLEX TO CULTURE (INFECTION SUSPECTED) - Abnormal; Notable for the following components:  APPearance CLOUDY (*)    Nitrite POSITIVE (*)    Leukocytes,Ua LARGE (*)    Bacteria, UA FEW (*)    All other components within normal limits  URINE CULTURE  CULTURE, BLOOD (ROUTINE X 2)  CULTURE, BLOOD (ROUTINE X 2)  LIPASE, BLOOD  TSH  AMMONIA  I-STAT CG4 LACTIC ACID, ED  I-STAT CG4 LACTIC ACID, ED    EKG EKG Interpretation Date/Time:  Friday August 01 2023 14:18:19 EDT Ventricular Rate:  98 PR Interval:    QRS Duration:  124 QT Interval:  402 QTC Calculation: 492 R Axis:   34  Text Interpretation: Sinus arrhythmia IVCD, consider atypical LBBB when compared to prior, similar sinus arrythmia. No STEMI Confirmed by Theda Belfast ([id number]) on 08/01/2023 2:23:47 PM  Radiology No results found.  Procedures Procedures      Medications Ordered in ED Medications - No data to display  ED Course/ Medical Decision Making/ A&P                                 Medical Decision Making Amount and/or Complexity of Data Reviewed Labs: ordered. Radiology: ordered.  Risk Decision regarding hospitalization.    Sindy Mccune is a 78 y.o. female with a past medical history significant for spastic quadriplegia, MS, hypothyroidism, and previous urinary tract infection with Foley catheter dependence who presents for altered mental status.  According to the EMS report and caregiver report, patient has had a steady  decline in appetite and mental status for the last week or so.  She has been on antibiotics for UTI and they are worried is not improving.  She has a catheter chronically.  She has had some dry cough with occasional productive phlegm.  She takes oxygen on and off and is on 2 L currently with oxygen saturations in the 90s on arrival.  She has not had any documented fevers at home by report and has had no trauma.  Patient is somnolent and falling back asleep immediately which is different for her.  On exam, lungs had some coarseness.  Chest and abdomen were nontender.  Bowel sounds were appreciated.  Patient has dry mucous membranes.  Patient will open eyes minimally to voice and will open to stimulation.  Given the patient's symptoms and evaluation I am concerned about persistent UTI causing encephalopathy as she has had in the past.  With his cough with a chest x-ray.  Will get a CT head given the altered mental status as well.  Will get screening labs.  Will get another urinalysis.  Due to the altered mental status with somnolence, anticipate admission when workup is completed.       Care transferred oncoming team to wait for results of workup and reassessment.  Dissipate admission due to altered mental status          Final Clinical Impression(s) / ED Diagnoses Final diagnoses:  Somnolence  Altered mental status, unspecified altered mental status type    Clinical Impression: 1. Somnolence   2. Altered mental status, unspecified altered mental status type     Disposition: Care transferred oncoming team to wait for results of workup and reassessment.  Dissipate admission due to altered mental status  This note was prepared with assistance of Dragon voice recognition software. Occasional wrong-word or sound-a-like substitutions may have occurred due to the inherent limitations of voice recognition software.      Britny Riel, Canary Brim, MD 08/01/23 1650

## 2023-08-01 NOTE — ED Provider Notes (Signed)
 Patient was initially seen by Dr. Delton See alert.  Please see his note.  Patient presented to the ER for evaluation of change in mental status with concern for recurrent UTI. Patient's laboratory test did not show any significant metabolic abnormality.  No signs of lactic acidosis.  Vital signs remained stable.  No acute findings were noted on the head CT and chest x-ray.  Patient's urinalysis is consistent with a urinary tract infection.  With her worsening symptoms despite outpatient antibiotics we will consult the medical service for admission and further treatment.  Case discussed with Family medicine service   Linwood Dibbles, MD 08/01/23 1840

## 2023-08-01 NOTE — Assessment & Plan Note (Signed)
 Has suprapubic catheter d/t neurological bladder.  Last replaced 4/4 by [facility] health urology.  Takes Vesicare 5 mg daily and mirabegron ER 50 mg daily.  Patient is currently n.p.o. given concern of altered mental status and history of diagnosis of silent aspiration.

## 2023-08-01 NOTE — Assessment & Plan Note (Signed)
 Hypothyroidism: TSH wnl this admission. On synthroid 25 mcg at home. Holding as she is NPO. Will restart when able to PO.  MS: not on any DMTs  Complete heart block s/p pacemaker placement: Initial EKG concerning for arrhythmia, will reorder EKG

## 2023-08-01 NOTE — Hospital Course (Addendum)
 [name] is a 78 y.o.female with PMHx cardiomyopathy, complete heart block s/p pacemaker, hypothyroidism, h/ogastric bypass, esophageal dysmotility, suprapubic catheter w/ persistent UTI, neurogenic bladder, multiple sclerosis, post-traumatic quadriplegia who was admitted to the [facility] Medicine Teaching Service at [facility] for AMS. Her hospital course is detailed below:  Altered mental status Presented with increased sleepiness per family.  Has history of UTI related encephalopathy in the past.  UA with evidence of UTI, treated with 1 dose of ceftriaxone in the ED. Started on meropenem given last urine culture in March with Citrobacter and Enterococcus.  Urine culture this admission with Citrobacter, susceptible to nitrofurantion.  Blood cultures no growth x 48 hours.  CT neck without tracheal source for altered mental status, though shows possible aspiration pneumonia. Patient was discharged on nitrofurantoin for three days for a total treatment course of 7 days.   Concern for aspiration  Patient with history of aspiration pneumonia and given some cough on admission, discussed with SLP. SLP recommended dysphagia 3 diet along with strict aspiration precautions. Patient may benefit from outpatient SLP MBS.    Other chronic conditions were medically managed with home medications and formulary alternatives as necessary (suprapubic catheter, spastic quadriplegia, history of tracheostomy)  PCP Follow-up Recommendations: Consider follow up speech evaluation for aspiration risk Will need close follow up with urology given recurrent UTIs  Consider evidence of treatment UA

## 2023-08-01 NOTE — ED Triage Notes (Signed)
 Pt bib ems from home c/o weakness and decline in appetite. Pt has a foley which is chronic. Pt has been having tx for an UTI for awhile. She is currently on abx tx.   Audible rhonchi noted  Hx quadriplegic  BP 118/80 RA 92 (Pt wasn't on oxygen when EMS arrived.) Baseline 2 L nasal canula 97% HR 80-98 RR 16 CBG 154
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
## 2023-08-01 NOTE — Assessment & Plan Note (Signed)
 Patient has history of altered mental status secondary to UTI.  Considered alternative causes, currently high suspicion for infectious cause of AMS with UTI being the most likely cause, but may be related to new tracheal aspirate and cough. Per last culture obtained at 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 Discussed antibiotic choice with pharmacy, both species sensitive to meropenem. Will consult ID tomorrow considering extended course of UTI and multi-drug resistant organism. Called Urology for concern of needing catheter change given patient has been colonized for so long, they do not believe she needs a catheter exchange at this time and will not see patient.   - Admit to FMTS, attending: Dr. Lum Babe  - Meropenem per pharmacy  - Will discuss with ID in AM  - Vitals per floor - Blood culture & Urine Culture pending - Will obtain CT soft tissue neck for concern of new tracheal leakage - NPO pending SLP evaluation - PT/OT consult - Fall precautions - Strict I/Os  - AM CMP/CBC - CCM
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
## 2023-08-01 NOTE — Progress Notes (Signed)
 Pharmacy Antibiotic Note  [name] is a 78 y.o. female admitted on 08/01/2023 with complicated UTI.  Pharmacy has been consulted for meropenem dosing.  WBC 11.8, LA 1.3, afebrile. Scr 0.4 (CrCl 56 mL/min). UA 4/11 large leukocytes, (+) nitrite, few bacteria, WBC >50, and 0-5 squamous epithelial cells.  Recently seen at [facility] by ID which listed the following urine cultures reviewed: -March 2025: Citrobacter freundii and Enterococcus faecalis -February 2025: Citrobacter -January 2025: Proteus mirabilis -December 2024: Citrobacter and Enterococcus faecalis -December 2024: Proteus -November 2024: Proteus and Enterococcus faecalis -October 2024: Candida orthopsilosis   Plan: Meropenem 1g IV every 8 hours  Monitor renal fx, cx results, clinical pic  Height: 5' 4.5" (163.8 cm) Weight: 68 kg (150 lb) IBW/kg (Calculated) : 55.85  Temp (24hrs), Avg:98 F (36.7 C), Min:97.9 F (36.6 C), Max:98.1 F (36.7 C)  Recent Labs  Lab 08/01/23 1518 08/01/23 1539  WBC 11.8*  --   CREATININE 0.40*  --   LATICACIDVEN  --  1.3    Estimated Creatinine Clearance: 56.4 mL/min (A) (by C-G formula based on SCr of 0.4 mg/dL (L)).    Allergies  Allergen Reactions   Ceftin [Cefuroxime] Swelling    - Pt states she tolerated penicillins in the past - recently tolerated multiple rounds of cephalosporins and amoxicillin    Levofloxacin Anaphylaxis    Tolerated ciprofloxacin 11/2022   Pregabalin Swelling    Bladder problems    Antimicrobials this admission: Ceftriaxone 4/11 x1 Meropenem 4/11 >>   Dose adjustments this admission: N/A  Microbiology results: 4/11 BCx: sent 4/11 UCx: sent   Thank you for allowing pharmacy to participate in this patient's care,  Sherron Monday, PharmD, BCCCP Clinical Pharmacist  Phone: [phone] 08/01/2023 9:00 PM  Please check AMION for all [facility] Pharmacy phone numbers After 10:00 PM, call Main Pharmacy [phone]

## 2023-08-01 NOTE — ED Notes (Signed)
 Pt caregiver stated pt wears 2L nasal canula at night while resting.

## 2023-08-01 NOTE — Assessment & Plan Note (Signed)
 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 On multiple medications including baclofen and gabapentin. Do have concern for some withdrawal effects if patient is unable to receive. These medications do not have PO formulations.  - NPO pending SLP evaluation - consider prn benzo vs methocarbamol if patient experiencing worsening spasticity  - wound care consult for chronic wounds

## 2023-08-02 ENCOUNTER — Observation Stay (HOSPITAL_COMMUNITY)

## 2023-08-02 DIAGNOSIS — N390 Urinary tract infection, site not specified: Principal | ICD-10-CM

## 2023-08-02 LAB — CBC
HCT: 43.1 % (ref 36.0–46.0)
Hemoglobin: 14 g/dL (ref 12.0–15.0)
MCH: 30.4 pg (ref 26.0–34.0)
MCHC: 32.5 g/dL (ref 30.0–36.0)
MCV: 93.5 fL (ref 80.0–100.0)
Platelets: 220 10*3/uL (ref 150–400)
RBC: 4.61 MIL/uL (ref 3.87–5.11)
RDW: 15.7 % — ABNORMAL HIGH (ref 11.5–15.5)
WBC: 8.1 10*3/uL (ref 4.0–10.5)
nRBC: 0 % (ref 0.0–0.2)

## 2023-08-02 LAB — COMPREHENSIVE METABOLIC PANEL WITH GFR
ALT: 22 U/L (ref 0–44)
AST: 20 U/L (ref 15–41)
Albumin: 3 g/dL — ABNORMAL LOW (ref 3.5–5.0)
Alkaline Phosphatase: 44 U/L (ref 38–126)
Anion gap: 15 (ref 5–15)
BUN: 18 mg/dL (ref 8–23)
CO2: 22 mmol/L (ref 22–32)
Calcium: 9.1 mg/dL (ref 8.9–10.3)
Chloride: 104 mmol/L (ref 98–111)
Creatinine, Ser: 0.42 mg/dL — ABNORMAL LOW (ref 0.44–1.00)
GFR, Estimated: >60 mL/min (ref >60)
Glucose, Bld: 84 mg/dL (ref 70–99)
Potassium: 3.9 mmol/L (ref 3.5–5.1)
Sodium: 141 mmol/L (ref 135–145)
Total Bilirubin: 1 mg/dL (ref 0.0–1.2)
Total Protein: 5.9 g/dL — ABNORMAL LOW (ref 6.5–8.1)

## 2023-08-02 MED ORDER — BACLOFEN 10 MG PO TABS
20.0000 mg | ORAL_TABLET | Freq: Three times a day (TID) | ORAL | Status: DC
  Administered 2023-08-02 – 2023-08-04 (×6): 20 mg via ORAL
  Filled 2023-08-02 (×6): qty 2

## 2023-08-02 MED ORDER — DULOXETINE HCL 60 MG PO CPEP
60.0000 mg | ORAL_CAPSULE | Freq: Every day | ORAL | Status: DC
  Administered 2023-08-02 – 2023-08-04 (×3): 60 mg via ORAL
  Filled 2023-08-02 (×3): qty 1

## 2023-08-02 MED ORDER — BARRIER CREAM NON-SPECIFIED: 1.0000 | TOPICAL_CREAM | Freq: Two times a day (BID) | TOPICAL | Status: DC | PRN

## 2023-08-02 MED ORDER — IOHEXOL 350 MG/ML SOLN
75.0000 mL | Freq: Once | INTRAVENOUS | Status: AC | PRN
  Administered 2023-08-02: 75 mL via INTRAVENOUS

## 2023-08-02 MED ORDER — LEVOTHYROXINE SODIUM 25 MCG PO TABS
25.0000 ug | ORAL_TABLET | Freq: Every day | ORAL | Status: DC
  Administered 2023-08-03 – 2023-08-04 (×2): 25 ug via ORAL
  Filled 2023-08-02 (×2): qty 1

## 2023-08-02 MED ORDER — ORAL CARE MOUTH RINSE
15.0000 mL | OROMUCOSAL | Status: DC
  Administered 2023-08-02 – 2023-08-04 (×8): 15 mL via OROMUCOSAL

## 2023-08-02 MED ORDER — MIRABEGRON ER 50 MG PO TB24
50.0000 mg | ORAL_TABLET | Freq: Every day | ORAL | Status: DC
  Administered 2023-08-02 – 2023-08-04 (×3): 50 mg via ORAL
  Filled 2023-08-02 (×3): qty 1

## 2023-08-02 MED ORDER — ORAL CARE MOUTH RINSE: 15.0000 mL | OROMUCOSAL | Status: DC | PRN

## 2023-08-02 NOTE — Progress Notes (Signed)
 Daily Progress Note Intern Pager: [phone]  Patient name: [name] Medical record number: [id number] Date of birth: [birth year] Age: 78 y.o. Gender: female  Primary Care Provider: Gerrianne Krauss, PA-C Consultants: None Code Status: Full  Pt Overview and Major Events to Date:  -Admitted 08/02/22  Assessment and Plan:  [name] is 78 y.o. female admitted for altered mental status likely secondary to infectious encephalopathy (urosepsis). Pertinent PMH/PSH includes MS, spastic quadriplegia, CHF s/p pacemaker placement.  Assessment & Plan AMS (altered mental status) Mental status today improved. Infectious etiology. Continue UTI treatment as below. Tracheal source unlikely based on exam. CT Neck pending. - Meropenem per pharmacy  - F/u Blood culture & Urine Culture - CT neck pending - NPO pending SLP evaluation - PT/OT eval - Fall precautions - Strict I/Os  - AM CMP/CBC - CCM  Hx of tracheostomy New drainage from healed tracheostomy site. Unlikely infection. Hx of silent aspiration with aspiration pneumonia.  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 On multiple medications including baclofen and gabapentin. Do have concern for some withdrawal effects if patient is unable to receive. These medications do not have IV formulations.  - NPO pending SLP evaluation - consider prn benzo vs methocarbamol if patient experiencing worsening spasticity  - wound care consult for chronic wounds    Chronic health problem Hypothyroidism: Restart Synthroid as able MS: not on any DMTs  Suprapubic catheter - Restart Vesicare 5mg  daily and Mirabegron ER 50mg  daily as able Complete heart block s/p pacemaker placement: No new EKG changes  FEN/GI: NPO, pending  speech eval PPx: Lovenox Dispo:Pending improved mental status  Subjective:  NAEO. Doing better. Daughter at bedside. More awake. Patient feels she has improved. No abdominal pain.  Objective: Temp:  [97.9 F (36.6 C)-98.1 F (36.7 C)] 98.1 F (36.7 C) (04/12 0602) Pulse Rate:  [60-93] 64 (04/12 0602) Resp:  [11-17] 16 (04/12 0602) BP: (98-139)/(64-114) 116/69 (04/12 0602) SpO2:  [95 %-99 %] 99 % (04/12 0602) Weight:  [68 kg] 68 kg (04/11 1501) Physical Exam: General: NAD, chronically ill appearing Neck: minimal skin breakdown at well healed tracheal wound site, no erythema, no drainage Neuro: A&Ox2 (person place), PERRL Cardiovascular: irregular rhythm, no murmurs, no peripheral edema Respiratory: normal WOB on RA, CTAB, no wheezes, ronchi or rales Abdomen: soft, NTTP, no rebound or guarding, suprapubic catheter, bandage, no obvious erythema Extremities: Quadriplegic, bilateral arms contracted   Laboratory: Most recent CBC Lab Results  Component Value Date   WBC 11.8 (H) 08/01/2023   HGB 14.9 08/01/2023   HCT 45.9 08/01/2023   MCV 95.0 08/01/2023   PLT 242 08/01/2023   Most recent BMP    Latest Ref Rng & Units 08/01/2023    3:18 PM  BMP  Glucose 70 - 99 mg/dL 474   BUN 8 - 23 mg/dL 19   Creatinine 2.59 - 1.00 mg/dL 5.63   Sodium 875 - 643 mmol/L 139   Potassium 3.5 - 5.1 mmol/L 4.7   Chloride 98 - 111 mmol/L 101   CO2 22 - 32 mmol/L 24   Calcium 8.9 - 10.3 mg/dL 8.9    Ammonia 20  Imaging/Diagnostic Tests:  CT Neck - pending  Ivin Marrow, MD 08/02/2023, 7:43 AM  PGY-2, [facility] FPTS Intern  pager: [phone], text pages welcome Secure chat group [facility] Teaching Service

## 2023-08-02 NOTE — Assessment & Plan Note (Addendum)
 New drainage from healed tracheostomy site. Unlikely infection. Hx of silent aspiration with aspiration pneumonia.  - NPO pending SLP eval - 2L [place] at night for comfort  - If respiratory status worsens, consider Chest CT to r/o  developing aspiration pneumonitis vs pneumonia

## 2023-08-02 NOTE — Plan of Care (Signed)
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
## 2023-08-02 NOTE — Plan of Care (Signed)
  Problem: Clinical Measurements: Goal: Ability to maintain clinical measurements within normal limits will improve Outcome: Progressing Goal: Will remain free from infection Outcome: Progressing Goal: Diagnostic test results will improve Outcome: Progressing Goal: Respiratory complications will improve Outcome: Progressing Goal: Cardiovascular complication will be avoided Outcome: Progressing   Problem: Coping: Goal: Level of anxiety will decrease Outcome: Progressing   Problem: Elimination: Goal: Will not experience complications related to bowel motility Outcome: Progressing Goal: Will not experience complications related to urinary retention Outcome: Progressing   Problem: Pain Managment: Goal: General experience of comfort will improve and/or be controlled Outcome: Progressing   Problem: Safety: Goal: Ability to remain free from injury will improve Outcome: Progressing   Problem: Skin Integrity: Goal: Risk for impaired skin integrity will decrease Outcome: Progressing
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
## 2023-08-02 NOTE — Care Management (Signed)
 Patient unable to sign MOON letter, deferred to her daughter Tyra Galley. Left HIPAA complaint VM on unidentified line requesting callback.  Ellis,Denise (Daughter) [phone]

## 2023-08-02 NOTE — Assessment & Plan Note (Addendum)
 Hypothyroidism: Restart Synthroid as able MS: not on any DMTs  Suprapubic catheter - Restart Vesicare 5mg  daily and Mirabegron ER 50mg  daily as able Complete heart block s/p pacemaker placement: No new EKG changes

## 2023-08-02 NOTE — Assessment & Plan Note (Addendum)
 Mental status today improved. Infectious etiology. Continue UTI treatment as below. Tracheal source unlikely based on exam. CT Neck pending. - Meropenem per pharmacy  - F/u Blood culture & Urine Culture - CT neck pending - NPO pending SLP evaluation - PT/OT eval - Fall precautions - Strict I/Os  - AM CMP/CBC - CCM
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
## 2023-08-03 DIAGNOSIS — N319 Neuromuscular dysfunction of bladder, unspecified: Secondary | ICD-10-CM | POA: Diagnosis present

## 2023-08-03 DIAGNOSIS — Z9981 Dependence on supplemental oxygen: Secondary | ICD-10-CM | POA: Diagnosis not present

## 2023-08-03 DIAGNOSIS — J690 Pneumonitis due to inhalation of food and vomit: Secondary | ICD-10-CM | POA: Diagnosis present

## 2023-08-03 DIAGNOSIS — N390 Urinary tract infection, site not specified: Secondary | ICD-10-CM | POA: Diagnosis present

## 2023-08-03 DIAGNOSIS — R4182 Altered mental status, unspecified: Secondary | ICD-10-CM | POA: Diagnosis not present

## 2023-08-03 DIAGNOSIS — Z8701 Personal history of pneumonia (recurrent): Secondary | ICD-10-CM | POA: Diagnosis not present

## 2023-08-03 DIAGNOSIS — Z950 Presence of cardiac pacemaker: Secondary | ICD-10-CM | POA: Diagnosis not present

## 2023-08-03 DIAGNOSIS — Z90710 Acquired absence of both cervix and uterus: Secondary | ICD-10-CM | POA: Diagnosis not present

## 2023-08-03 DIAGNOSIS — G8250 Quadriplegia, unspecified: Secondary | ICD-10-CM | POA: Diagnosis present

## 2023-08-03 DIAGNOSIS — L89611 Pressure ulcer of right heel, stage 1: Secondary | ICD-10-CM | POA: Diagnosis present

## 2023-08-03 DIAGNOSIS — E039 Hypothyroidism, unspecified: Secondary | ICD-10-CM | POA: Diagnosis present

## 2023-08-03 DIAGNOSIS — Z87440 Personal history of urinary (tract) infections: Secondary | ICD-10-CM | POA: Diagnosis not present

## 2023-08-03 DIAGNOSIS — L89621 Pressure ulcer of left heel, stage 1: Secondary | ICD-10-CM | POA: Diagnosis present

## 2023-08-03 DIAGNOSIS — G35 Multiple sclerosis: Secondary | ICD-10-CM | POA: Diagnosis present

## 2023-08-03 DIAGNOSIS — R400 Somnolence: Secondary | ICD-10-CM

## 2023-08-03 DIAGNOSIS — Z79890 Hormone replacement therapy (postmenopausal): Secondary | ICD-10-CM | POA: Diagnosis not present

## 2023-08-03 DIAGNOSIS — G9349 Other encephalopathy: Secondary | ICD-10-CM | POA: Diagnosis present

## 2023-08-03 DIAGNOSIS — Z9884 Bariatric surgery status: Secondary | ICD-10-CM | POA: Diagnosis not present

## 2023-08-03 DIAGNOSIS — Z79899 Other long term (current) drug therapy: Secondary | ICD-10-CM | POA: Diagnosis not present

## 2023-08-03 LAB — COMPREHENSIVE METABOLIC PANEL WITH GFR
ALT: 18 U/L (ref 0–44)
AST: 17 U/L (ref 15–41)
Albumin: 2.8 g/dL — ABNORMAL LOW (ref 3.5–5.0)
Alkaline Phosphatase: 41 U/L (ref 38–126)
Anion gap: 11 (ref 5–15)
BUN: 20 mg/dL (ref 8–23)
CO2: 24 mmol/L (ref 22–32)
Calcium: 8.7 mg/dL — ABNORMAL LOW (ref 8.9–10.3)
Chloride: 104 mmol/L (ref 98–111)
Creatinine, Ser: 0.36 mg/dL — ABNORMAL LOW (ref 0.44–1.00)
GFR, Estimated: >60 mL/min (ref >60)
Glucose, Bld: 132 mg/dL — ABNORMAL HIGH (ref 70–99)
Potassium: 3.6 mmol/L (ref 3.5–5.1)
Sodium: 139 mmol/L (ref 135–145)
Total Bilirubin: 0.8 mg/dL (ref 0.0–1.2)
Total Protein: 6.2 g/dL — ABNORMAL LOW (ref 6.5–8.1)

## 2023-08-03 LAB — CBC
HCT: 43.4 % (ref 36.0–46.0)
Hemoglobin: 14.3 g/dL (ref 12.0–15.0)
MCH: 30.4 pg (ref 26.0–34.0)
MCHC: 32.9 g/dL (ref 30.0–36.0)
MCV: 92.3 fL (ref 80.0–100.0)
Platelets: 212 10*3/uL (ref 150–400)
RBC: 4.7 MIL/uL (ref 3.87–5.11)
RDW: 15.4 % (ref 11.5–15.5)
WBC: 7.3 10*3/uL (ref 4.0–10.5)
nRBC: 0 % (ref 0.0–0.2)

## 2023-08-03 MED ORDER — POLYETHYLENE GLYCOL 3350 17 G PO PACK
17.0000 g | PACK | Freq: Every day | ORAL | Status: DC
  Administered 2023-08-03 – 2023-08-04 (×2): 17 g via ORAL
  Filled 2023-08-03 (×2): qty 1

## 2023-08-03 MED ORDER — GABAPENTIN 400 MG PO CAPS
1200.0000 mg | ORAL_CAPSULE | Freq: Three times a day (TID) | ORAL | Status: DC
  Administered 2023-08-03 – 2023-08-04 (×4): 1200 mg via ORAL
  Filled 2023-08-03 (×4): qty 3

## 2023-08-03 MED ORDER — ZINC OXIDE 40 % EX OINT
TOPICAL_OINTMENT | Freq: Two times a day (BID) | CUTANEOUS | Status: DC
  Filled 2023-08-03: qty 57

## 2023-08-03 MED ORDER — FESOTERODINE FUMARATE ER 4 MG PO TB24
4.0000 mg | ORAL_TABLET | Freq: Every day | ORAL | Status: DC
  Administered 2023-08-04: 4 mg via ORAL
  Filled 2023-08-03 (×2): qty 1
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
## 2023-08-03 NOTE — Assessment & Plan Note (Signed)
 Continues to improve with UTI treatment as below.  CT neck without tracheal source, though does demonstrate potential aspiration pneumonia.  Regardless, she is on meropenem which would cover most likely organisms. - Continue meropenem - Follow-up blood and urine cultures, narrow antibiotics as able - Dysphagia 3 diet, appreciate SLP consult - PT/OT without further recommendations given patient is at baseline - Fall precautions - Strict I/O's - AM CBC/CMP - CCM
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
## 2023-08-03 NOTE — Assessment & Plan Note (Signed)
 CT neck without tracheal source of infection. - Continue diet as above - 2 L nasal cannula at night for comfort

## 2023-08-03 NOTE — Progress Notes (Addendum)
 Orthopedic Tech Progress Note Patient Details:  [name] 08-Sep-1945 [id number]  Order for a L resting hand splint called into [facility].  Patient ID: [name], female   DOB: [birth year], 78 y.o.   MRN: [id number]  [name] 08/03/2023, 12:08 PM

## 2023-08-03 NOTE — Plan of Care (Signed)

## 2023-08-03 NOTE — Care Management Obs Status (Signed)
 MEDICARE OBSERVATION STATUS NOTIFICATION   Patient Details  Name: Lesleyanne Politte MRN: [id number] Date of Birth: 1945/06/12   Medicare Observation Status Notification Given:  No (patient confused, daughter never called back)    Omie Bickers, RN 08/03/2023, 7:57 AM

## 2023-08-03 NOTE — Consult Note (Signed)
 WOC Nurse Consult Note: Reason for Consult: patient immobile, Stage 1 to B heels, irritant contact dermatitis to coccyx  Wound type: 1.  Stage 1 Pressure Injury B heels 2.  Moisture Associated Skin Damage coccyx/sacrum/buttocks  Pressure Injury POA: Yes Measurement: see nursing flowsheet  Wound bed: B heels intact skin with non-blanchable erythema; widespread erythema with partial thickness skin loss sacrum/coccyx/buttocks  Drainage (amount, consistency, odor)  Periwound:  Dressing procedure/placement/frequency: 1  Apply silicone foam to B heels and place in Prevalon boots to offload pressure Timm Foot [phone]).  2. Cleanse bilateral buttocks/sacrum/coccyx with soap and water, dry and apply a thin layer of Desitin 2 times a day and prn soiling  Patient is on a low air loss mattress for pressure redistribution and moisture management.   POC discussed with bedside nurse. WOC team will not follow. Re-consult if further needs arise.   Thank you,    Ronni Colace MSN, RN-BC, [employer] [phone]

## 2023-08-03 NOTE — Assessment & Plan Note (Signed)
 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  Follows with [facility]. -Continue Vesicare and mirabegron

## 2023-08-04 ENCOUNTER — Other Ambulatory Visit (HOSPITAL_COMMUNITY): Payer: Self-pay

## 2023-08-04 DIAGNOSIS — N390 Urinary tract infection, site not specified: Secondary | ICD-10-CM | POA: Diagnosis not present

## 2023-08-04 DIAGNOSIS — R4182 Altered mental status, unspecified: Secondary | ICD-10-CM | POA: Diagnosis not present

## 2023-08-04 LAB — CBC
HCT: 44.8 % (ref 36.0–46.0)
Hemoglobin: 14.2 g/dL (ref 12.0–15.0)
MCH: 29.9 pg (ref 26.0–34.0)
MCHC: 31.7 g/dL (ref 30.0–36.0)
MCV: 94.3 fL (ref 80.0–100.0)
Platelets: 199 10*3/uL (ref 150–400)
RBC: 4.75 MIL/uL (ref 3.87–5.11)
RDW: 15.5 % (ref 11.5–15.5)
WBC: 6.5 10*3/uL (ref 4.0–10.5)
nRBC: 0 % (ref 0.0–0.2)

## 2023-08-04 LAB — BASIC METABOLIC PANEL WITH GFR
Anion gap: 10 (ref 5–15)
BUN: 19 mg/dL (ref 8–23)
CO2: 22 mmol/L (ref 22–32)
Calcium: 8.5 mg/dL — ABNORMAL LOW (ref 8.9–10.3)
Chloride: 109 mmol/L (ref 98–111)
Creatinine, Ser: 0.35 mg/dL — ABNORMAL LOW (ref 0.44–1.00)
GFR, Estimated: >60 mL/min (ref >60)
Glucose, Bld: 125 mg/dL — ABNORMAL HIGH (ref 70–99)
Potassium: 4 mmol/L (ref 3.5–5.1)
Sodium: 141 mmol/L (ref 135–145)

## 2023-08-04 LAB — URINE CULTURE: Culture: >=100000 — AB

## 2023-08-04 MED ORDER — NITROFURANTOIN MACROCRYSTAL 50 MG PO CAPS
100.0000 mg | ORAL_CAPSULE | Freq: Four times a day (QID) | ORAL | 0 refills | Status: DC
  Filled 2023-08-04: qty 20, 3d supply, fill #0
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
## 2023-08-04 NOTE — Progress Notes (Signed)
 Orthopedic Tech Progress Note Patient Details:  Norleen Xie Nov 09, 1945 [id number]  Called in order to [facility] for a RESTING HAND SPLINT   Patient ID: Khiya Friese, female   DOB: 1946/04/07, 78 y.o.   MRN: [id number]  Kermitt Pedlar 08/04/2023, 9:50 AM

## 2023-08-04 NOTE — Progress Notes (Signed)
 1610 Contacted Ortho tech to deliver brace but had not received. 1541 contacted Ortho tech again for brace to be delivered. Ortho tech stated they would check to see if tech was on the way with the brace. Brace was not delivered before patient discharged home. Plan is to contact daughter when brace has been delivered for her to pick up.

## 2023-08-04 NOTE — TOC Transition Note (Addendum)
 Transition of Care [facility]) - Discharge Note   Patient Details  Name: [name] MRN: [id number] Date of Birth: 09-25-1945  Transition of Care [facility]) CM/SW Contact:  Jeani Mill, RN Phone Number: 08/04/2023, 12:34 PM   Clinical Narrative:    Patient stable for discharge.  Patient lives with daughter who will bring home portable 02 tank for discharge.  Patient is active with [facility] for monthly injections. [facility] with [facility] will accept patient for home health RN. Bedside RN will teach daughter how to do dressing changes.  PCP verified.    Final next level of care: Home w Home Health Services Barriers to Discharge: Barriers Resolved   Patient Goals and CMS Choice Patient states their goals for this hospitalization and ongoing recovery are:: daughter wants patient to retuen home CMS Medicare.gov Compare Post Acute Care list provided to:: Patient Represenative (must comment) Choice offered to / list presented to : Adult Children      Discharge Placement             Home          Discharge Plan and Services Additional resources added to the After Visit Summary for     Discharge Planning Services: CM Consult Post Acute Care Choice: Home Health                    HH Arranged: RN [facility] Agency: [facility] Date [facility] Agency Contacted: 08/04/23 Time HH Agency Contacted: 1231 Representative spoke with at [facility] Agency: Imelda Man  Social Drivers of Health (SDOH) Interventions SDOH Screenings   Food Insecurity: No Food Insecurity (08/01/2023)  Housing: Unknown (08/01/2023)  Transportation Needs: No Transportation Needs (08/01/2023)  Utilities: Not At Risk (08/01/2023)  Financial Resource Strain: Low Risk  (08/01/2023)   Received from [facility]  Physical Activity: Unknown (08/01/2023)   Received from [facility]  Social Connections: Patient Unable To Answer (08/01/2023)  Stress: No Stress Concern Present (08/01/2023)   Received from [facility]  Tobacco  Use: Low Risk  (08/01/2023)     Readmission Risk Interventions    08/04/2023   12:33 PM  Readmission Risk Prevention Plan  Post Dischage Appt Complete  Medication Screening Complete  Transportation Screening Complete

## 2023-08-04 NOTE — Assessment & Plan Note (Deleted)
 Hypothyroidism: Restart Synthroid as able MS: not on any DMTs  Suprapubic catheter - continue  Vesicare 5mg  daily and Mirabegron ER 50mg  daily Complete heart block s/p pacemaker placement: No new EKG changes Hx of tracheostomy: continue 2L [place] at night for comfort  Spastic quadriplegia: continue home baclofen and gabapentin, wound care on board for chronic wounds

## 2023-08-04 NOTE — Progress Notes (Signed)
 Speech Language Pathology Treatment: Dysphagia  Patient Details Name: [name] MRN: [id number] DOB: [birth year] Today's Date: 08/04/2023 Time: 1425-1440 SLP Time Calculation (min) (ACUTE ONLY): 15 min  Assessment / Plan / Recommendation Clinical Impression  Patient seen by SLP for skilled treatment focused on dysphagia goals. Caregivers in room as well. SLP discussed with patient and caregivers about her current swallow function as well recommendations from MBS this past November 2024. Caregivers discussed how they tried to use the thickener (nectar thick) for her drinks at home following that MBS but patient did not like it and would refuse so she has been drinking thin liquids at home. Caregivers did indicate that patient requests things like candy when she is lying flat or semi reclined in bed but they try to discourage this. SLP observed patient with sips of thin liquids from straw with her exhibiting multiple swallows but no overt s/s aspiration. SLP reviewed prior recommendations to continue with meds crushed in puree, for patient to be sitting upright for all PO's, for her to swallow an extra time after PO's and to cough to clear throat after swallowing. In addition, SLP recommending good oral care. Caregivers in agreement and they can provide assistance and cues. SLP not recommending further intervention during this venue of care or next. SLP to s/o at this time.   HPI HPI: [name] is 78 y.o. female admitted for altered mental status likely secondary to infectious encephalopathy (urosepsis). Pertinent PMH/PSH includes MS, spastic quadriplegia, CHF s/p pacemaker placement. Pt had a MBS on 01/22/23 that indicated penetration and aspiration with thins and nectar thick and deep penetration with HTL. SLP recommended NTL to reduce likelihood of repeated aspiration. Per family reports, the pt did not adhere to nectar thick liquid while at home and has been on thin liquid.      SLP  Plan  All goals met      Recommendations for follow up therapy are one component of a multi-disciplinary discharge planning process, led by the attending physician.  Recommendations may be updated based on patient status, additional functional criteria and insurance authorization.    Recommendations  Diet recommendations: Dysphagia 3 (mechanical soft);Thin liquid Liquids provided via: Cup;Straw Medication Administration: Crushed with puree Supervision: Full supervision/cueing for compensatory strategies;Staff to assist with self feeding Compensations: Slow rate;Small sips/bites;Hard cough after swallow;Clear throat after each swallow Postural Changes and/or Swallow Maneuvers: Seated upright 90 degrees                  Oral care BID;Staff/trained caregiver to provide oral care   Frequent or constant Supervision/Assistance Dysphagia, oropharyngeal phase (R13.12)     All goals met    Jacqualine Mater, MA, CCC-SLP Speech Therapy

## 2023-08-04 NOTE — Assessment & Plan Note (Deleted)
 Continues to improve with UTI treatment as below.  CT neck without tracheal source, though does demonstrate potential aspiration pneumonia.  Regardless, she is on meropenem which would cover most likely organisms. - as sensitives returned, will consider transitioning to nitrofurantion (will need 2 50mg  tablets q6h as these can be crushed) - Bcx NTGD  - SLP following, appreciate recs  - fall precautions - strict I/Os - AM BMP/CBC

## 2023-08-04 NOTE — Discharge Summary (Signed)
 Family Medicine Teaching [facility] Discharge Summary  Patient name: [name] Medical record number: [id number] Date of birth: 1945/12/26 Age: 78 y.o. Gender: female Date of Admission: 08/01/2023  Date of Discharge: 08/04/23  Admitting Physician: Doreene Eland, MD  Primary Care Provider: Ladora Daniel, PA-C Consultants:  None  Indication for Hospitalization: Altered Mental Status  Discharge Diagnoses/Problem List:  Principal Problem for Admission: AMS 2/2 to UTI  Other Problems addressed during stay:  Principal Problem:   AMS (altered mental status) Active Problems:   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HCC)   Chronic health problem   Suprapubic catheter (HCC)   Hx of tracheostomy   Urinary tract infection without hematuria  Brief Hospital Course:  [name] is a 78 y.o.female with PMHx cardiomyopathy, complete heart block s/p pacemaker, hypothyroidism, h/ogastric bypass, esophageal dysmotility, suprapubic catheter w/ persistent UTI, neurogenic bladder, multiple sclerosis, post-traumatic quadriplegia who was admitted to the [facility] Medicine Teaching Service at [facility] for AMS. Her hospital course is detailed below:  Altered mental status Presented with increased sleepiness per family.  Has history of UTI related encephalopathy in the past.  UA with evidence of UTI, treated with 1 dose of ceftriaxone in the ED. Started on meropenem given last urine culture in March with Citrobacter and Enterococcus.  Urine culture this admission with Citrobacter, susceptible to nitrofurantion.  Blood cultures no growth x 48 hours.  CT neck without tracheal source for altered mental status, though shows possible aspiration pneumonia. Patient was discharged on nitrofurantoin for three days for a total treatment course of 7 days.   Concern for aspiration  Patient with history of aspiration pneumonia and given some cough on admission, discussed with SLP. SLP recommended dysphagia 3 diet along with  strict aspiration precautions. Patient may benefit from outpatient SLP MBS.    Other chronic conditions were medically managed with home medications and formulary alternatives as necessary (suprapubic catheter, spastic quadriplegia, history of tracheostomy)  PCP Follow-up Recommendations: Consider follow up speech evaluation for aspiration risk Will need close follow up with urology given recurrent UTIs  Consider evidence of treatment UA  Disposition: Home  Discharge Condition: Stable  Discharge Exam:  Vitals:   08/03/23 1924 08/04/23 0726  BP: 121/80 (!) 152/94  Pulse: 84 69  Resp: 18 17  Temp: 98.8 F (37.1 C) 98.4 F (36.9 C)  SpO2: 98% 97%   General: A&O, NAD HEENT: No sign of trauma, EOM grossly intact Cardiac: RRR, Respiratory: NWOB on 2L, CTAB  GI: soft, suprapubic catheter wrapped and clean  Neuro: alert and oriented, quadriplegia  Psych: Appropriate mood and affect   Significant Procedures: None   Significant Labs and Imaging:  Recent Labs  Lab 08/03/23 1033 08/04/23 0650  WBC 7.3 6.5  HGB 14.3 14.2  HCT 43.4 44.8  PLT 212 199   Recent Labs  Lab 08/03/23 1033 08/04/23 0650  NA 139 141  K 3.6 4.0  CL 104 109  CO2 24 22  GLUCOSE 132* 125*  BUN 20 19  CREATININE 0.36* 0.35*  CALCIUM 8.7* 8.5*  ALKPHOS 41  --   AST 17  --   ALT 18  --   ALBUMIN 2.8*  --     Pertinent Imaging    CXR 4/11: IMPRESSION: Hyperinflation with chronic changes.  Pacemaker.  CT Head WO Contrast: 1. No acute intracranial abnormality or significant interval change. 2. Mild generalized atrophy and moderate diffuse white matter disease likely reflects the sequela of chronic microvascular ischemia. 3. Asymmetric periventricular and subcortical white  matter hypoattenuation in the anterior left frontal lobe is nonspecific, but likely reflects the sequela of chronic microvascular ischemia.  CT Soft Tissue Neck: 1. Sequelae of prior tracheostomy at the subglottic  airway. No inflammatory changes, collections, or other adverse features seen at this location. 2. Layering secretions within the subglottic airway. Finding suggests that this patient is at risk for aspiration. 3. Scattered nodular ground-glass densities within the lungs, suggesting small airways disease/infection. Sequelae of aspiration could be considered given the secretions in the subglottic airway. 4. No other acute abnormality within the neck.  Results/Tests Pending at Time of Discharge: None   Discharge Medications:  Allergies as of 08/04/2023   No Known Allergies      Medication List     TAKE these medications    ALLEVYN SACRUM EX Apply 1 application  topically daily as needed (thinning skin/skin breakdown on sacrum).   baclofen 20 MG tablet Commonly known as: LIORESAL Take 1 tablet (20 mg total) by mouth 3 (three) times daily.   bisacodyl 10 MG suppository Commonly known as: DULCOLAX Place 10 mg rectally at bedtime as needed (constipation).   collagenase 250 UNIT/GM ointment Commonly known as: SANTYL Apply 1 Application topically daily as needed (wound healing).   cyanocobalamin 1000 MCG/ML injection Commonly known as: VITAMIN B12 Inject 1,000 mcg into the muscle every 30 (thirty) days.   DULoxetine 60 MG capsule Commonly known as: CYMBALTA TAKE ONE CAPSULE BY MOUTH EVERY MORNING AFTER MEAL What changed: See the new instructions.   fluticasone 50 MCG/ACT nasal spray Commonly known as: FLONASE Place 1 spray into both nostrils daily as needed for allergies.   gabapentin 600 MG tablet Commonly known as: NEURONTIN Take 1,200 mg by mouth 3 (three) times daily.   levocetirizine 5 MG tablet Commonly known as: XYZAL Take 5 mg by mouth at bedtime.   levothyroxine 25 MCG tablet Commonly known as: SYNTHROID Take 25 mcg by mouth daily before breakfast.   megestrol 625 MG/5ML suspension Commonly known as: MEGACE ES Take 625 mg by mouth daily as needed  (appetite stimulant).   montelukast 10 MG tablet Commonly known as: SINGULAIR Take 10 mg by mouth at bedtime.   Multivitamin Women 50+ Tabs Take 1 tablet by mouth daily with supper.   Myrbetriq 50 MG Tb24 tablet Generic drug: mirabegron ER Take 50 mg by mouth daily with lunch.   nitrofurantoin 50 MG capsule Commonly known as: MACRODANTIN Take 2 capsules (100 mg total) by mouth 4 (four) times daily for 10 doses. Open each capsule. Okay to mix in applesauce or pudding. Start taking on: August 05, 2023   OXYGEN Inhale 1 L/min into the lungs at bedtime.   polyethylene glycol 17 g packet Commonly known as: MIRALAX / GLYCOLAX Take 17 g by mouth daily.   solifenacin 5 MG tablet Commonly known as: VESICARE Take 5 mg by mouth daily with lunch.   traMADol 50 MG tablet Commonly known as: ULTRAM Take 50 mg by mouth every 8 (eight) hours as needed for moderate pain (pain score 4-6).   traZODone 50 MG tablet Commonly known as: DESYREL Take 100 mg by mouth at bedtime.   VITAMIN C PO Take 1 tablet by mouth 2 (two) times daily with a meal.   VITAMIN D-3 PO Take 1 tablet by mouth daily with supper.   Voltaren 1 % Gel Generic drug: diclofenac Sodium Apply 2 g topically 4 (four) times daily as needed (pain).  Discharge Care Instructions  (From admission, onward)           Start     Ordered   08/04/23 0000  Discharge wound care:       Comments: Comments: Apply silicone foam to B heels and place in Prevalon boots to offload pressure Timm Foot [id number]). Lift foam daily to assess heels.  Change foam q3 days and prn soiling.   08/04/23 1037            Discharge Instructions: Please refer to Patient Instructions section of EMR for full details.  Patient was counseled important signs and symptoms that should prompt return to medical care, changes in medications, dietary instructions, activity restrictions, and follow up appointments.   Follow-Up  Appointments: Patient instructed to follow up with PCP    Farris Hong, MD 08/04/2023, 12:23 PM PGY-1, [facility] Family Medicine

## 2023-08-04 NOTE — Discharge Instructions (Addendum)
 Dear [name],  Thank you for letting us  participate in your care. You were hospitalized for altered mental status and diagnosed with UTI. You were treated with antibiotics and your symptoms improved.   POST-HOSPITAL & CARE INSTRUCTIONS Please call your PCP and urologist to schedule a follow up appointment in the next 1-2 weeks  Take care and be well!  Family Medicine Teaching Service Inpatient Team [place]  [facility]  [address] [place], [place] [address] [phone]

## 2023-08-04 NOTE — Plan of Care (Signed)

## 2023-08-06 ENCOUNTER — Emergency Department (HOSPITAL_COMMUNITY)

## 2023-08-06 ENCOUNTER — Encounter (HOSPITAL_COMMUNITY): Payer: Self-pay | Admitting: Family Medicine

## 2023-08-06 ENCOUNTER — Other Ambulatory Visit: Payer: Self-pay

## 2023-08-06 ENCOUNTER — Inpatient Hospital Stay (HOSPITAL_COMMUNITY)
Admission: EM | Admit: 2023-08-06 | Discharge: 2023-08-14 | DRG: 177 | Disposition: A | Discharge status: Hospice | Attending: Family Medicine | Admitting: Family Medicine

## 2023-08-06 DIAGNOSIS — B370 Candidal stomatitis: Secondary | ICD-10-CM | POA: Diagnosis present

## 2023-08-06 DIAGNOSIS — Z789 Other specified health status: Secondary | ICD-10-CM

## 2023-08-06 DIAGNOSIS — I442 Atrioventricular block, complete: Secondary | ICD-10-CM | POA: Diagnosis present

## 2023-08-06 DIAGNOSIS — Z950 Presence of cardiac pacemaker: Secondary | ICD-10-CM

## 2023-08-06 DIAGNOSIS — J386 Stenosis of larynx: Secondary | ICD-10-CM | POA: Diagnosis present

## 2023-08-06 DIAGNOSIS — J690 Pneumonitis due to inhalation of food and vomit: Secondary | ICD-10-CM | POA: Diagnosis present

## 2023-08-06 DIAGNOSIS — Z7189 Other specified counseling: Secondary | ICD-10-CM | POA: Diagnosis not present

## 2023-08-06 DIAGNOSIS — I5032 Chronic diastolic (congestive) heart failure: Secondary | ICD-10-CM | POA: Diagnosis present

## 2023-08-06 DIAGNOSIS — I471 Supraventricular tachycardia, unspecified: Secondary | ICD-10-CM | POA: Diagnosis present

## 2023-08-06 DIAGNOSIS — G894 Chronic pain syndrome: Secondary | ICD-10-CM | POA: Diagnosis present

## 2023-08-06 DIAGNOSIS — Z79890 Hormone replacement therapy (postmenopausal): Secondary | ICD-10-CM | POA: Diagnosis not present

## 2023-08-06 DIAGNOSIS — L89156 Pressure-induced deep tissue damage of sacral region: Secondary | ICD-10-CM | POA: Diagnosis present

## 2023-08-06 DIAGNOSIS — M810 Age-related osteoporosis without current pathological fracture: Secondary | ICD-10-CM | POA: Diagnosis present

## 2023-08-06 DIAGNOSIS — J189 Pneumonia, unspecified organism: Secondary | ICD-10-CM | POA: Diagnosis present

## 2023-08-06 DIAGNOSIS — Z9359 Other cystostomy status: Secondary | ICD-10-CM

## 2023-08-06 DIAGNOSIS — B3741 Candidal cystitis and urethritis: Secondary | ICD-10-CM | POA: Diagnosis present

## 2023-08-06 DIAGNOSIS — Z66 Do not resuscitate: Secondary | ICD-10-CM | POA: Diagnosis present

## 2023-08-06 DIAGNOSIS — R638 Other symptoms and signs concerning food and fluid intake: Secondary | ICD-10-CM | POA: Diagnosis not present

## 2023-08-06 DIAGNOSIS — I639 Cerebral infarction, unspecified: Secondary | ICD-10-CM | POA: Diagnosis not present

## 2023-08-06 DIAGNOSIS — K5900 Constipation, unspecified: Secondary | ICD-10-CM | POA: Diagnosis present

## 2023-08-06 DIAGNOSIS — E873 Alkalosis: Secondary | ICD-10-CM | POA: Diagnosis present

## 2023-08-06 DIAGNOSIS — J9611 Chronic respiratory failure with hypoxia: Secondary | ICD-10-CM | POA: Diagnosis present

## 2023-08-06 DIAGNOSIS — Z9981 Dependence on supplemental oxygen: Secondary | ICD-10-CM

## 2023-08-06 DIAGNOSIS — L89159 Pressure ulcer of sacral region, unspecified stage: Secondary | ICD-10-CM | POA: Diagnosis present

## 2023-08-06 DIAGNOSIS — R54 Age-related physical debility: Secondary | ICD-10-CM

## 2023-08-06 DIAGNOSIS — R4182 Altered mental status, unspecified: Secondary | ICD-10-CM | POA: Diagnosis present

## 2023-08-06 DIAGNOSIS — Z90710 Acquired absence of both cervix and uterus: Secondary | ICD-10-CM

## 2023-08-06 DIAGNOSIS — J45909 Unspecified asthma, uncomplicated: Secondary | ICD-10-CM | POA: Insufficient documentation

## 2023-08-06 DIAGNOSIS — Z515 Encounter for palliative care: Secondary | ICD-10-CM | POA: Diagnosis not present

## 2023-08-06 DIAGNOSIS — Z8711 Personal history of peptic ulcer disease: Secondary | ICD-10-CM

## 2023-08-06 DIAGNOSIS — R410 Disorientation, unspecified: Secondary | ICD-10-CM | POA: Diagnosis not present

## 2023-08-06 DIAGNOSIS — J9811 Atelectasis: Secondary | ICD-10-CM | POA: Diagnosis present

## 2023-08-06 DIAGNOSIS — N319 Neuromuscular dysfunction of bladder, unspecified: Secondary | ICD-10-CM | POA: Diagnosis present

## 2023-08-06 DIAGNOSIS — K224 Dyskinesia of esophagus: Secondary | ICD-10-CM | POA: Diagnosis present

## 2023-08-06 DIAGNOSIS — G35 Multiple sclerosis: Secondary | ICD-10-CM | POA: Diagnosis present

## 2023-08-06 DIAGNOSIS — R8279 Other abnormal findings on microbiological examination of urine: Secondary | ICD-10-CM | POA: Insufficient documentation

## 2023-08-06 DIAGNOSIS — Z7401 Bed confinement status: Secondary | ICD-10-CM

## 2023-08-06 DIAGNOSIS — I480 Paroxysmal atrial fibrillation: Secondary | ICD-10-CM | POA: Diagnosis present

## 2023-08-06 DIAGNOSIS — J9809 Other diseases of bronchus, not elsewhere classified: Secondary | ICD-10-CM | POA: Diagnosis present

## 2023-08-06 DIAGNOSIS — E039 Hypothyroidism, unspecified: Secondary | ICD-10-CM | POA: Diagnosis present

## 2023-08-06 DIAGNOSIS — N390 Urinary tract infection, site not specified: Secondary | ICD-10-CM | POA: Diagnosis not present

## 2023-08-06 DIAGNOSIS — Z87440 Personal history of urinary (tract) infections: Secondary | ICD-10-CM

## 2023-08-06 DIAGNOSIS — R7989 Other specified abnormal findings of blood chemistry: Secondary | ICD-10-CM | POA: Diagnosis not present

## 2023-08-06 DIAGNOSIS — Z808 Family history of malignant neoplasm of other organs or systems: Secondary | ICD-10-CM

## 2023-08-06 DIAGNOSIS — G9341 Metabolic encephalopathy: Secondary | ICD-10-CM

## 2023-08-06 DIAGNOSIS — E46 Unspecified protein-calorie malnutrition: Secondary | ICD-10-CM | POA: Diagnosis not present

## 2023-08-06 DIAGNOSIS — E440 Moderate protein-calorie malnutrition: Secondary | ICD-10-CM | POA: Diagnosis present

## 2023-08-06 DIAGNOSIS — R1313 Dysphagia, pharyngeal phase: Secondary | ICD-10-CM | POA: Diagnosis not present

## 2023-08-06 DIAGNOSIS — S14109S Unspecified injury at unspecified level of cervical spinal cord, sequela: Secondary | ICD-10-CM

## 2023-08-06 DIAGNOSIS — Q208 Other congenital malformations of cardiac chambers and connections: Secondary | ICD-10-CM

## 2023-08-06 DIAGNOSIS — I10 Essential (primary) hypertension: Secondary | ICD-10-CM | POA: Insufficient documentation

## 2023-08-06 DIAGNOSIS — Z79899 Other long term (current) drug therapy: Secondary | ICD-10-CM

## 2023-08-06 DIAGNOSIS — G8250 Quadriplegia, unspecified: Secondary | ICD-10-CM | POA: Diagnosis present

## 2023-08-06 DIAGNOSIS — E860 Dehydration: Secondary | ICD-10-CM | POA: Diagnosis present

## 2023-08-06 DIAGNOSIS — Z6825 Body mass index (BMI) 25.0-25.9, adult: Secondary | ICD-10-CM

## 2023-08-06 DIAGNOSIS — Z9884 Bariatric surgery status: Secondary | ICD-10-CM

## 2023-08-06 DIAGNOSIS — I5030 Unspecified diastolic (congestive) heart failure: Secondary | ICD-10-CM | POA: Diagnosis not present

## 2023-08-06 HISTORY — DX: Alkalosis: E87.3

## 2023-08-06 HISTORY — DX: Chronic diastolic (congestive) heart failure: I50.32

## 2023-08-06 LAB — COMPREHENSIVE METABOLIC PANEL WITH GFR
ALT: 14 U/L (ref 0–44)
AST: 24 U/L (ref 15–41)
Albumin: 2.8 g/dL — ABNORMAL LOW (ref 3.5–5.0)
Alkaline Phosphatase: 42 U/L (ref 38–126)
Anion gap: 8 (ref 5–15)
BUN: 13 mg/dL (ref 8–23)
CO2: 28 mmol/L (ref 22–32)
Calcium: 8.9 mg/dL (ref 8.9–10.3)
Chloride: 105 mmol/L (ref 98–111)
Creatinine, Ser: 0.33 mg/dL — ABNORMAL LOW (ref 0.44–1.00)
GFR, Estimated: >60 mL/min (ref >60)
Glucose, Bld: 101 mg/dL — ABNORMAL HIGH (ref 70–99)
Potassium: 4.5 mmol/L (ref 3.5–5.1)
Sodium: 141 mmol/L (ref 135–145)
Total Bilirubin: 0.5 mg/dL (ref 0.0–1.2)
Total Protein: 5.9 g/dL — ABNORMAL LOW (ref 6.5–8.1)

## 2023-08-06 LAB — URINALYSIS, W/ REFLEX TO CULTURE (INFECTION SUSPECTED)
Bilirubin Urine: NEGATIVE
Glucose, UA: NEGATIVE mg/dL
Ketones, ur: NEGATIVE mg/dL
Nitrite: NEGATIVE
Protein, ur: 30 mg/dL — AB
Specific Gravity, Urine: 1.016 (ref 1.005–1.030)
WBC, UA: >50 WBC/hpf (ref 0–5)
pH: 7 (ref 5.0–8.0)

## 2023-08-06 LAB — CBC WITH DIFFERENTIAL/PLATELET
Abs Immature Granulocytes: 0.23 10*3/uL — ABNORMAL HIGH (ref 0.00–0.07)
Basophils Absolute: 0.1 10*3/uL (ref 0.0–0.1)
Basophils Relative: 1 %
Eosinophils Absolute: 0.3 10*3/uL (ref 0.0–0.5)
Eosinophils Relative: 5 %
HCT: 45.2 % (ref 36.0–46.0)
Hemoglobin: 14.4 g/dL (ref 12.0–15.0)
Immature Granulocytes: 4 %
Lymphocytes Relative: 27 %
Lymphs Abs: 1.6 10*3/uL (ref 0.7–4.0)
MCH: 30.2 pg (ref 26.0–34.0)
MCHC: 31.9 g/dL (ref 30.0–36.0)
MCV: 94.8 fL (ref 80.0–100.0)
Monocytes Absolute: 0.5 10*3/uL (ref 0.1–1.0)
Monocytes Relative: 8 %
Neutro Abs: 3.4 10*3/uL (ref 1.7–7.7)
Neutrophils Relative %: 55 %
Platelets: 217 10*3/uL (ref 150–400)
RBC: 4.77 MIL/uL (ref 3.87–5.11)
RDW: 15 % (ref 11.5–15.5)
WBC: 6.1 10*3/uL (ref 4.0–10.5)
nRBC: 0 % (ref 0.0–0.2)

## 2023-08-06 LAB — CULTURE, BLOOD (ROUTINE X 2)
Culture: NO GROWTH
Culture: NO GROWTH

## 2023-08-06 LAB — I-STAT VENOUS BLOOD GAS, ED
Acid-Base Excess: 10 mmol/L — ABNORMAL HIGH (ref 0.0–2.0)
Bicarbonate: 32.6 mmol/L — ABNORMAL HIGH (ref 20.0–28.0)
Calcium, Ion: 1.03 mmol/L — ABNORMAL LOW (ref 1.15–1.40)
HCT: 43 % (ref 36.0–46.0)
Hemoglobin: 14.6 g/dL (ref 12.0–15.0)
O2 Saturation: 99 %
Potassium: 4.8 mmol/L (ref 3.5–5.1)
Sodium: 139 mmol/L (ref 135–145)
TCO2: 34 mmol/L — ABNORMAL HIGH (ref 22–32)
pCO2, Ven: 35.9 mmHg — ABNORMAL LOW (ref 44–60)
pH, Ven: 7.566 — ABNORMAL HIGH (ref 7.25–7.43)
pO2, Ven: 124 mmHg — ABNORMAL HIGH (ref 32–45)

## 2023-08-06 LAB — I-STAT CG4 LACTIC ACID, ED: Lactic Acid, Venous: 1.1 mmol/L (ref 0.5–1.9)

## 2023-08-06 MED ORDER — LACTATED RINGERS IV BOLUS (SEPSIS)
500.0000 mL | Freq: Once | INTRAVENOUS | Status: AC
  Administered 2023-08-06: 500 mL via INTRAVENOUS

## 2023-08-06 MED ORDER — ALLEVYN SACRUM EX PADS: MEDICATED_PAD | Freq: Every day | CUTANEOUS | Status: DC | PRN

## 2023-08-06 MED ORDER — LACTATED RINGERS IV SOLN: INTRAVENOUS | Status: AC

## 2023-08-06 MED ORDER — MIRABEGRON ER 25 MG PO TB24
50.0000 mg | ORAL_TABLET | Freq: Every day | ORAL | Status: DC
  Filled 2023-08-06: qty 1

## 2023-08-06 MED ORDER — CETIRIZINE HCL 10 MG PO TABS
10.0000 mg | ORAL_TABLET | Freq: Every day | ORAL | Status: DC
Start: 2023-08-06 — End: 2023-08-15
  Administered 2023-08-07 – 2023-08-11 (×4): 10 mg via ORAL
  Filled 2023-08-06 (×9): qty 1

## 2023-08-06 MED ORDER — ADULT MULTIVITAMIN W/MINERALS CH
1.0000 | ORAL_TABLET | Freq: Every day | ORAL | Status: DC
  Administered 2023-08-09 – 2023-08-11 (×3): 1 via ORAL
  Filled 2023-08-06 (×3): qty 1

## 2023-08-06 MED ORDER — PIPERACILLIN-TAZOBACTAM 3.375 G IVPB
3.3750 g | Freq: Three times a day (TID) | INTRAVENOUS | Status: AC
  Administered 2023-08-06 – 2023-08-12 (×20): 3.375 g via INTRAVENOUS
  Filled 2023-08-06 (×20): qty 50

## 2023-08-06 MED ORDER — ENOXAPARIN SODIUM 40 MG/0.4ML IJ SOSY
40.0000 mg | PREFILLED_SYRINGE | INTRAMUSCULAR | Status: DC
  Administered 2023-08-07 – 2023-08-14 (×8): 40 mg via SUBCUTANEOUS
  Filled 2023-08-06 (×8): qty 0.4

## 2023-08-06 MED ORDER — LEVOTHYROXINE SODIUM 25 MCG PO TABS
25.0000 ug | ORAL_TABLET | Freq: Every day | ORAL | Status: DC
  Administered 2023-08-08 – 2023-08-11 (×3): 25 ug via ORAL
  Filled 2023-08-06 (×4): qty 1

## 2023-08-06 MED ORDER — DULOXETINE HCL 60 MG PO CPEP
60.0000 mg | ORAL_CAPSULE | Freq: Every day | ORAL | Status: DC
  Administered 2023-08-09 – 2023-08-12 (×4): 60 mg via ORAL
  Filled 2023-08-06 (×5): qty 1

## 2023-08-06 MED ORDER — POLYETHYLENE GLYCOL 3350 17 G PO PACK
17.0000 g | PACK | Freq: Every day | ORAL | Status: DC
  Administered 2023-08-09 – 2023-08-12 (×4): 17 g via ORAL
  Filled 2023-08-06 (×5): qty 1

## 2023-08-06 MED ORDER — FLUTICASONE PROPIONATE 50 MCG/ACT NA SUSP: 1.0000 | Freq: Every day | NASAL | Status: DC | PRN

## 2023-08-06 MED ORDER — FESOTERODINE FUMARATE ER 4 MG PO TB24
4.0000 mg | ORAL_TABLET | Freq: Every day | ORAL | Status: DC
  Filled 2023-08-06 (×2): qty 1

## 2023-08-06 MED ORDER — BACLOFEN 10 MG PO TABS
20.0000 mg | ORAL_TABLET | Freq: Three times a day (TID) | ORAL | Status: DC
  Administered 2023-08-07 – 2023-08-12 (×10): 20 mg via ORAL
  Filled 2023-08-06 (×12): qty 2

## 2023-08-06 MED ORDER — MONTELUKAST SODIUM 10 MG PO TABS
10.0000 mg | ORAL_TABLET | Freq: Every day | ORAL | Status: DC
  Administered 2023-08-07 – 2023-08-11 (×4): 10 mg via ORAL
  Filled 2023-08-06 (×5): qty 1

## 2023-08-06 NOTE — Hospital Course (Addendum)
 [name] is a 78 y.o.female with a history of spastic quadriplegia, multiple sclerosis, neurogenic bladder with suprapubic catheter, complete heart block status post pacemaker who was admitted to the family medicine teaching Service at [facility] for altered mental status. Her hospital course is detailed below:  AMS Patient arrives listless and not responsive in ED.  Did not have any electrolyte abnormality or hypercapnia.  Of note, patient was recently discharged after altered mental status suspected secondary to UTI.  Of note, daughter reports that patient needed increased O2 since last discharge. Unremarkable CT head with unchanged EKG.  CT chest obtained which showed opacified left mainstem bronchus and subtotal left lung collapse concerning for aspiration pneumonia versus mucous plugging.  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 Pulmonology was consulted who recommended flex bronchoscopy with therapeutic suctioning.  Patient completed Zosyn  course for suspected aspiration pneumonia while admitted. SLP also evaluated patient, performed MBS, and recommended dysphagia 1 diet with crushed meds.   Family requested to see Palliative care to discuss GOC. Patient discharged home with hospice.***  UTI Urine culture obtained during this admission positive for leuks, white blood cells.  Patient was recently admitted for Citrobacter UTI, sensitive to Zosyn .  Patient was treated with Zosyn  for both UTI and pneumonia. Suprapubic catheter exchanged on 4/22. Urine culture with yeast, thought to be colonization per ID.   Other chronic conditions were medically managed with home medications and formulary alternatives as necessary (Neurogenic bladder, hypothyroidism, MS)  PCP Follow-up Recommendations: Follow up memory concerns

## 2023-08-06 NOTE — ED Notes (Signed)
 Hospitalist at the bedside

## 2023-08-06 NOTE — Progress Notes (Signed)
 Pharmacy Antibiotic Note  [name] is a 78 y.o. female admitted on 08/06/2023 with Aspiriation PNA, CAP, UTI.  Pharmacy has been consulted for Zosyn dosing.  Plan: Start Zosyn 3.375g IV q8h (4 hour infusion). Follow cultures, length of therapy, de-escalation   Height: 5' 4.5" (163.8 cm) Weight: 68 kg (150 lb) IBW/kg (Calculated) : 55.85  Temp (24hrs), Avg:99 F (37.2 C), Min:98.5 F (36.9 C), Max:99.5 F (37.5 C)  Recent Labs  Lab 08/01/23 1518 08/01/23 1539 08/02/23 0919 08/03/23 1033 08/04/23 0650 08/06/23 1150 08/06/23 1212  WBC 11.8*  --  8.1 7.3 6.5 6.1  --   CREATININE 0.40*  --  0.42* 0.36* 0.35* 0.33*  --   LATICACIDVEN  --  1.3  --   --   --   --  1.1    Estimated Creatinine Clearance: 56.4 mL/min (A) (by C-G formula based on SCr of 0.33 mg/dL (L)).    No Known Allergies  Antimicrobials this admission: Zosyn 4/16 >> c  Microbiology results: 4/16 BCx: pending  4/16 UCx: pending   Thank you for allowing pharmacy to be a part of this patient's care.  Patience Bonito 08/06/2023 5:23 PM

## 2023-08-06 NOTE — Assessment & Plan Note (Addendum)
 Patient afebrile without leukocytosis. Was receiving treatment for known UTI. UA today with continued signs of UTI. CXR today with possible increasing infiltrate on L compared to previous 4/11 CXR. Concern for HAP given recent admission. Previous CT Neck on 4/12 with layering secretions within subglottic airway and recent choking event concerning for aspiration. CT Head without acute findings. EKG consistent with prior.  - Admit to FMTS with attending Dr McDiarmid  - Zosyn per pharmacy - concern for HAP or aspiration PNA in addition to UTI per below  - CT Chest tomorrow for further pulmonary evaluation  - Follow up ammonia  - Follow up blood cultures  - HIV/RPR, vit B, folate, thiamine - If AMS not improving, consider brain MRI and neuro consult  - AM BMP, CBC - Continuous cardiac monitoring  - Vitals per floor - q4 neuro checks - O2 supplementation as needed  - Delirium precautions  - NPO while altered, pending SLP eval

## 2023-08-06 NOTE — ED Provider Notes (Signed)
 [place] EMERGENCY DEPARTMENT AT [facility] Provider Note   CSN: [id number] Arrival date & time: 08/06/23  1120     History  Chief Complaint  Patient presents with   Altered Mental Status    [name] is a 78 y.o. female.   Altered Mental Status    Patient has a history of posttraumatic quadriplegia multiple sclerosis insomnia, chronic pain syndrome.  She was recently admitted to the hospital on April 11 and discharged on April 14.  Patient was admitted for somnolence and altered mental status.  During her hospitalization patient was treated for a urinary tract infection.  Patient also had evidence of possible aspiration pneumonia.  Patient presented to the ED today for decline in her mental status.  According to the EMS report obtained from family members the patient has not been acting like herself for the last couple days.  She has had a gradual decline in her mental status.  Today she is not speaking to them or answering any questions.  Last night they thought she may have aspirated some of her medications.  Home Medications Prior to Admission medications   Medication Sig Start Date End Date Taking? Authorizing Provider  Ascorbic Acid (VITAMIN C PO) Take 1 tablet by mouth 2 (two) times daily with a meal.    [provider]  baclofen (LIORESAL) 20 MG tablet Take 1 tablet (20 mg total) by mouth 3 (three) times daily. 01/24/23   Annett Fabian, MD  bisacodyl (DULCOLAX) 10 MG suppository Place 10 mg rectally at bedtime as needed (constipation).    [provider]  Cholecalciferol (VITAMIN D-3 PO) Take 1 tablet by mouth daily with supper.    [provider]  collagenase (SANTYL) 250 UNIT/GM ointment Apply 1 Application topically daily as needed (wound healing).    [provider]  cyanocobalamin (,VITAMIN B-12,) 1000 MCG/ML injection Inject 1,000 mcg into the muscle every 30 (thirty) days. 05/14/19   [provider]   diclofenac Sodium (VOLTAREN) 1 % GEL Apply 2 g topically 4 (four) times daily as needed (pain).    [provider]  DULoxetine (CYMBALTA) 60 MG capsule TAKE ONE CAPSULE BY MOUTH EVERY MORNING AFTER MEAL Patient taking differently: Take 60 mg by mouth daily. 07/21/23   Levert Feinstein, MD  fluticasone (FLONASE) 50 MCG/ACT nasal spray Place 1 spray into both nostrils daily as needed for allergies. 07/15/23   [provider]  gabapentin (NEURONTIN) 600 MG tablet Take 1,200 mg by mouth 3 (three) times daily. 03/02/19   [provider]  levocetirizine (XYZAL) 5 MG tablet Take 5 mg by mouth at bedtime. 04/27/19   [provider]  levothyroxine (SYNTHROID) 25 MCG tablet Take 25 mcg by mouth daily before breakfast. 04/25/19   [provider]  megestrol (MEGACE ES) 625 MG/5ML suspension Take 625 mg by mouth daily as needed (appetite stimulant).    [provider]  mirabegron ER (MYRBETRIQ) 50 MG TB24 tablet Take 50 mg by mouth daily with lunch.    [provider]  montelukast (SINGULAIR) 10 MG tablet Take 10 mg by mouth at bedtime. 06/26/20   [provider]  Multiple Vitamins-Minerals (MULTIVITAMIN WOMEN 50+) TABS Take 1 tablet by mouth daily with supper.    [provider]  nitrofurantoin (MACRODANTIN) 50 MG capsule Take 2 capsules (100 mg total) by mouth 4 (four) times daily for 10 doses. Open each capsule. Okay to mix in applesauce or pudding. 08/05/23 08/08/23  Celine Mans, MD  OXYGEN Inhale 1 L/min into the lungs at bedtime.    [provider]  polyethylene glycol (MIRALAX / GLYCOLAX) 17 g packet Take 17 g by mouth daily.    [provider]  solifenacin (VESICARE) 5 MG tablet Take 5 mg by mouth daily with lunch.    [provider]  traMADol (ULTRAM) 50 MG tablet Take 50 mg by mouth every 8 (eight) hours as needed for moderate pain (pain score 4-6).    [provider]  traZODone (DESYREL) 50 MG  tablet Take 100 mg by mouth at bedtime. 05/12/19   [provider]  Wound Dressings (ALLEVYN SACRUM EX) Apply 1 application  topically daily as needed (thinning skin/skin breakdown on sacrum).    [provider]      Allergies    Patient has no known allergies.    Review of Systems   Review of Systems  Physical Exam Updated Vital Signs BP (!) 158/87   Pulse (!) 56   Temp 99.5 F (37.5 C) (Rectal)   Resp 12   Ht 1.638 m (5' 4.5")   Wt 68 kg   SpO2 97%   BMI 25.35 kg/m  Physical Exam Vitals and nursing note reviewed.  Constitutional:      Appearance: She is well-developed. She is not diaphoretic.  HENT:     Head: Normocephalic and atraumatic.     Right Ear: External ear normal.     Left Ear: External ear normal.  Eyes:     General: No scleral icterus.       Right eye: No discharge.        Left eye: No discharge.     Conjunctiva/sclera: Conjunctivae normal.  Neck:     Trachea: No tracheal deviation.  Cardiovascular:     Rate and Rhythm: Normal rate and regular rhythm.  Pulmonary:     Effort: Pulmonary effort is normal. No respiratory distress.     Breath sounds: Normal breath sounds. No stridor. No wheezing or rales.  Abdominal:     General: Bowel sounds are normal. There is no distension.     Palpations: Abdomen is soft.     Tenderness: There is no abdominal tenderness. There is no guarding or rebound.  Musculoskeletal:        General: No tenderness or deformity.     Cervical back: Neck supple.  Skin:    General: Skin is warm and dry.     Findings: No rash.  Neurological:     General: No focal deficit present.     GCS: GCS eye subscore is 2. GCS verbal subscore is 1. GCS motor subscore is 5.     Cranial Nerves: No cranial nerve deficit, dysarthria or facial asymmetry.     Sensory: No sensory deficit.     Motor: Abnormal muscle tone present. No seizure activity.     Coordination: Coordination normal.     Comments: Spastic quadriplegia, patient  not responding to questions.  Not answering verbally.  Psychiatric:        Mood and Affect: Mood normal.     ED Results / Procedures / Treatments   Labs (all labs ordered are listed, but only abnormal results are displayed) Labs Reviewed  COMPREHENSIVE METABOLIC PANEL WITH GFR - Abnormal; Notable for the following components:      Result Value   Glucose, Bld 101 (*)    Creatinine, Ser 0.33 (*)    Total Protein 5.9 (*)    Albumin 2.8 (*)  All other components within normal limits  CBC WITH DIFFERENTIAL/PLATELET - Abnormal; Notable for the following components:   Abs Immature Granulocytes 0.23 (*)    All other components within normal limits  I-STAT VENOUS BLOOD GAS, ED - Abnormal; Notable for the following components:   pH, Ven 7.566 (*)    pCO2, Ven 35.9 (*)    pO2, Ven 124 (*)    Bicarbonate 32.6 (*)    TCO2 34 (*)    Acid-Base Excess 10.0 (*)    Calcium, Ion 1.03 (*)    All other components within normal limits  CULTURE, BLOOD (ROUTINE X 2)  CULTURE, BLOOD (ROUTINE X 2)  AMMONIA  URINALYSIS, W/ REFLEX TO CULTURE (INFECTION SUSPECTED)  I-STAT CG4 LACTIC ACID, ED  I-STAT CG4 LACTIC ACID, ED    EKG EKG Interpretation Date/Time:  Wednesday August 06 2023 11:42:15 EDT Ventricular Rate:  96 PR Interval:  168 QRS Duration:  121 QT Interval:  379 QTC Calculation: 479 R Axis:   -22  Text Interpretation: Atrial-sensed ventricular-paced rhythm No further analysis attempted due to paced rhythm No significant change since last tracing Confirmed by Linwood Dibbles [phone]) on 08/06/2023 11:47:22 AM  Radiology CT Head Wo Contrast Result Date: 08/06/2023 CLINICAL DATA:  Mental status change of unknown cause.  Lethargy. EXAM: CT HEAD WITHOUT CONTRAST TECHNIQUE: Contiguous axial images were obtained from the base of the skull through the vertex without intravenous contrast. RADIATION DOSE REDUCTION: This exam was performed according to the departmental dose-optimization program which  includes automated exposure control, adjustment of the mA and/or kV according to patient size and/or use of iterative reconstruction technique. COMPARISON:  08/01/2023 FINDINGS: Brain: Age related volume loss as seen previously. No focal abnormality visible affecting the brainstem or cerebellum. Cerebral hemispheres show advanced chronic small-vessel ischemic changes of the white no sign of acute infarction, mass lesion, hemorrhage, hydrocephalus or extra-axial collection. Vascular: There is atherosclerotic calcification of the major vessels at the base of the brain. Skull: Negative Sinuses/Orbits: Clear/normal Other: None IMPRESSION: No acute CT finding. Age related volume loss. Advanced chronic small-vessel ischemic changes of the cerebral hemispheric white matter. Electronically Signed   By: Paulina Fusi M.D.   On: 08/06/2023 12:13   DG Chest Port 1 View Result Date: 08/06/2023 CLINICAL DATA:  Questionable sepsis - evaluate for abnormality. EXAM: PORTABLE CHEST 1 VIEW COMPARISON:  08/01/2023. FINDINGS: Low lung volume. Bilateral lungs appear hyperlucent with coarse bronchovascular markings, concerning for underlying COPD. Bilateral lungs otherwise appear clear. No dense consolidation or lung collapse. Bilateral costophrenic angles are clear. Stable cardio-mediastinal silhouette. There is a left sided 2-lead pacemaker. No acute osseous abnormalities. Cervicothoracic spinal fixation hardware noted. The soft tissues are within normal limits. IMPRESSION: No active disease. Electronically Signed   By: Jules Schick M.D.   On: 08/06/2023 12:03    Procedures Procedures    Medications Ordered in ED Medications  lactated ringers bolus 500 mL (0 mLs Intravenous Stopped 08/06/23 1405)    ED Course/ Medical Decision Making/ A&P Clinical Course as of 08/06/23 1531  Wed Aug 06, 2023  1301 I-Stat venous blood gas, ([facility] ED, [facility], [facility])(!) Venous blood gas shows increased pH [JK]  1301 Head CT without acute  findings. [JK]  1301 Chest x-ray without acute findings. [JK]  1503 CT scan does not show any acute finding [JK]  1503 Metabolic panel without acute abnormality.  CBC normal. [JK]    Clinical Course User Index [JK] Linwood Dibbles, MD  Medical Decision Making Amount and/or Complexity of Data Reviewed Labs: ordered. Decision-making details documented in ED Course. Radiology: ordered.   Patient presented to the ED for evaluation of altered mental status.  Patient at baseline has quadriplegia and has had worsening lethargy over the last several days.  Initial ED workup without signs of any severe metabolic abnormality.  No hyponatremia.  No signs of severe dehydration.  Head CT does not show any signs of cerebral hemorrhage.  Patient has had UTIs in the past.  No signs of hypotension or elevated lactic acid level at this time but urinalysis is still pending.  Ammonia level is also pending.  Anticipate need for admission to the hospital.          Final Clinical Impression(s) / ED Diagnoses Final diagnoses:  Altered mental status, unspecified altered mental status type    Rx / DC Orders ED Discharge Orders     None         Trish Furl, MD 08/06/23 1531

## 2023-08-06 NOTE — Assessment & Plan Note (Addendum)
 UA with leuks, Hgb, RBC and WBC. Recently admitted with Citrobacter UTI, sensitive to zosyn.  Previously prescribed nitrofurantoin, will treat with Zosyn at this time for coverage of UTI and concern for PNA as above.  - Zosyn per pharmacy as above  - Fu urine culture

## 2023-08-06 NOTE — Assessment & Plan Note (Addendum)
 Spastic quadriplegia: Home baclofen 20 TID. Holding home tramadol, trazodone, gabapentin.  Neurogenic bladder: Suprapubic catheter. Home Vesicare 5 (Toviaz 4mg  is formulary equivalent) daily and Mirbegron ER 50 daily.  Hypothyroidism: Home synthroid 25 daily.  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

## 2023-08-06 NOTE — Assessment & Plan Note (Addendum)
 pH 7.566 with Bicarb 32.6 on arrival. Suspect dehydration in setting of decreased PO intake contributing to possible contraction alkalosis.  - LR 172ml/hr (s/p bolus in ED)  - Consider rechecking VBG for worsening mental status  - AM BMP

## 2023-08-06 NOTE — ED Notes (Signed)
 Patient evaluated for a swallow study due to patients family member stating patient was not able to swallow anything since last night, Patient has been lethargic all day during this shift until now where she is able to answer questions with a nod or shake her head no, I tried to give her some apple sauce but she will keep it in her mouth and swish it around and when she does swallow she coughs. Provider notified and receiving RN notified as well.

## 2023-08-06 NOTE — H&P (Cosign Needed Addendum)
 Hospital Admission History and Physical Service Pager: [phone]  Patient name: [name] Medical record number: [id number] Date of Birth: [birth year] Age: 78 y.o. Gender: female  Primary Care Provider: Ladora Daniel, PA-C Consultants: N/a Code Status: Full Code Preferred Emergency Contact:  Name Relation Mobile   Ellis,Denise Daughter [phone]      Chief Complaint: AMS  Assessment and Plan: [name] is a 78 y.o. female presenting with AMS. Differential for this patient's presentation of this includes:   Infection: Known UTI, was receiving abx though has indwelling suprapubic catheter. UA today with leuks, Hgb, RBC and WBC. CXR with some concern for focal consolidation. Recent hospitalization concerning for HAP. Known layering secretions within airway and choking episodes concerning for aspiration pna.  Electrolytes/metabolic: No electrolyte abnormality identified. Ammonia pending.  Acidosis: Unremarkable lactic acid. Alkalosis on VBG.  Stroke: Less likely given unremarkable CT head.  Hypoglycemia: Normal BG on arrival.  Assessment & Plan Altered mental status Patient afebrile without leukocytosis. Was receiving treatment for known UTI. UA today with continued signs of UTI. CXR today with possible increasing infiltrate on L compared to previous 4/11 CXR. Concern for HAP given recent admission. Previous CT Neck on 4/12 with layering secretions within subglottic airway and recent choking event concerning for aspiration. CT Head without acute findings. EKG consistent with prior.  - Admit to FMTS with attending Dr McDiarmid  - Zosyn per pharmacy - concern for HAP or aspiration PNA in addition to UTI per below  - CT Chest tomorrow for further pulmonary evaluation  - Follow up ammonia  - Follow up blood cultures  - HIV/RPR, vit B, folate, thiamine - If AMS not improving, consider brain MRI and neuro consult  - AM BMP, CBC - Continuous cardiac monitoring  - Vitals  per floor - q4 neuro checks - O2 supplementation as needed  - Delirium precautions  - NPO while altered, pending SLP eval  UTI (urinary tract infection) UA with leuks, Hgb, RBC and WBC. Recently admitted with Citrobacter UTI, sensitive to zosyn.  Previously prescribed nitrofurantoin, will treat with Zosyn at this time for coverage of UTI and concern for PNA as above.  - Zosyn per pharmacy as above  - Fu urine culture  Alkalosis pH 7.566 with Bicarb 32.6 on arrival. Suspect dehydration in setting of decreased PO intake contributing to possible contraction alkalosis.  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 Holding home tramadol, trazodone, gabapentin.  Neurogenic bladder: Suprapubic catheter. Home Vesicare 5 (Toviaz 4mg  is formulary equivalent) daily and Mirbegron ER 50 daily.  Hypothyroidism: Home synthroid 25 daily.  SVT s/p ablation, complete heart block s/p pacemaker: CCM MS: Not on any DMTs Constipation: Home miralax daily  Mood: Home cymbalta 60 daily  Allergies: Home singulair 10 daily, xyzal 5 daily, flonase daily prn   FEN/GI: NPO while altered, pending SLP eval  VTE Prophylaxis: Lovenox  Disposition: Med-Tele   History of Present Illness:  [name] is a 78 y.o. female presenting with AMS.   Recently hospitalized, discharged home on Monday evening. Since return home, hasn't been eating much. Was quieter yesterday and less coherent today. Has been sleepier, falling asleep at times in between words. Might have choked on medications last night. Coughing a lot, nothing coming up.  Has been on 3.5 L O2 throughout the day since last discharge. Some spit up, no  full vomiting, no diarrhea. Last small BM this morning. Urine has been very dark orange. No known fever. Last at baseline mental status 2 weeks ago, never fully returned during recent  admission though did improve overall.   In the ED, patient HDS with limited responsiveness. CXR with possible worsening infiltrates on L. Unremarkable CT head, unchanged EKG. Alkalosis to pH 7.566. UA with leuks, Hgb, RBC and WBC. Patient given LR fluid bolus. Blood cultures drawn, urine culture reflexed.   Review Of Systems: Per HPI  Pertinent Past Medical History: MS Spastic quadriplegia Neurogenic bladder  Hypothyroidism Osteoporosis  CHB s/p pacemaker  Pertinent Past Surgical History: Abdominal hysterectomy Cholecystectomy Gastric bypass Pacemaker placement  Remainder reviewed in history tab.   Pertinent Social History: Tobacco use: None Alcohol use: None Other Substance use: NOne Lives with daughter  Pertinent Family History: Mother - heart issues Remainder reviewed in history tab.   Important Outpatient Medications: All medication crushed and added to apple sauce.  Last took meds yesterday night, none today.   Nitrofurantoin 50 QID  Baclofen 20 TID Duloxetine 60 daily  Gabapentin 600 TID Levothyroxine 25 daily  Megestrol 625 prn for appetite  Montelukast 10 nightly  Myrbetriq 50 daily Vesicare 5 daily  Tramadol 50 q8 prn  Trazadone 100 nightly   Miralax daily  Flonase daily prn, Zyzal nightly   Vit B12 injection monthly  Bisacodyl 10 prn  Voltaren gel prn  Collagenase, allevyn prn  MVI, Vit C, Vit D3 Remainder reviewed in medication history.   Objective: BP (!) 158/87   Pulse (!) 56   Temp 99.5 F (37.5 C) (Rectal)   Resp 12   Ht 5' 4.5" (1.638 m)   Wt 68 kg   SpO2 97%   BMI 25.35 kg/m  Exam: General: Well-appearing. Resting comfortably in room. CV: Normal S1/S2. No extra heart sounds. Warm and well-perfused. Pulm: Breathing comfortably on 2.5L O2. CTAB of anterior fields. No increased WOB. Abd: Soft, non-tender, non-distended. Skin/Ext:  Warm, dry. No extremity swelling. Contracted upper extremities. Foot drop bilaterally. Normal radial  and DP pulses bilaterally.  Neuro: Alert to voice and touch. Intermittently closes eyes/sleepy. Oriented to name and birthday, not oriented to place or time. PEERL. No obvious facial droop. Unable to follow commands.   Labs:  CBC BMET  Recent Labs  Lab 08/06/23 1150 08/06/23 1211  WBC 6.1  --   HGB 14.4 14.6  HCT 45.2 43.0  PLT 217  --    Recent Labs  Lab 08/06/23 1150 08/06/23 1211  NA 141 139  K 4.5 4.8  CL 105  --   CO2 28  --   BUN 13  --   CREATININE 0.33*  --   GLUCOSE 101*  --   CALCIUM 8.9  --      Lactic Acid, Venous    Component Value Date/Time   LATICACIDVEN 1.1 08/06/2023 1212  Urinalysis    Component Value Date/Time   COLORURINE AMBER (A) 08/06/2023 1134   APPEARANCEUR CLOUDY (A) 08/06/2023 1134   LABSPEC 1.016 08/06/2023 1134   PHURINE 7.0 08/06/2023 1134   GLUCOSEU NEGATIVE 08/06/2023 1134   HGBUR SMALL (A) 08/06/2023 1134   BILIRUBINUR NEGATIVE 08/06/2023 1134   KETONESUR NEGATIVE 08/06/2023 1134   PROTEINUR 30 (A) 08/06/2023 1134   NITRITE NEGATIVE 08/06/2023 1134   LEUKOCYTESUR LARGE (A) 08/06/2023 1134   Ammonia: pending   EKG: Paced rhythm, similar to previous EKGs  Imaging Studies Performed: CXR: No active disease on report. Concern for  increasing infiltrate on in L lower field compared to prior CXR.  CT Head: No acute findings. Chronic small-vessel changes. Age volume loss.   Carey Chapman, MD 08/06/2023, 3:33 PM PGY-1, [facility]  FPTS Intern pager: [phone], text pages welcome Secure chat group [facility] [address] [facility] Teaching Service

## 2023-08-06 NOTE — Plan of Care (Signed)
 FMTS Brief Progress Note  To bedside for night rounds. Pt awake, responsive. Denies significant concerns   O: BP (!) 141/80   Pulse 68   Temp 98.4 F (36.9 C) (Oral)   Resp 15   Ht 5' 4.5" (1.638 m)   Wt 68 kg   SpO2 97%   BMI 25.35 kg/m    RRR CTAB anteriorly, normal WOB on supplemental O2 Awake, alert, oriented to self and place. Not oriented to time.  A/P:  UTI ?PNA -Continue abx and workup per today's H&P -CT chest tomorrow  - Orders reviewed. Labs for AM ordered, which was adjusted as needed.  - If condition changes, plan includes page primary team.   Edison Gore, MD 08/06/2023, 8:20 PM PGY-2, [facility] Night Resident  Please page [phone] with questions.

## 2023-08-06 NOTE — ED Triage Notes (Signed)
 Pt BIB EMS from home,family noted patient to become more lethargic these past couple of days, baseline A/Ox4, O2 1L [place] baseline. Patient was discharged 5 days ago for UTI, Patient reported to EMS that she may have aspirated on her meds last night.

## 2023-08-07 ENCOUNTER — Inpatient Hospital Stay (HOSPITAL_COMMUNITY)

## 2023-08-07 ENCOUNTER — Encounter (HOSPITAL_COMMUNITY): Payer: Self-pay | Admitting: Family Medicine

## 2023-08-07 DIAGNOSIS — G9341 Metabolic encephalopathy: Secondary | ICD-10-CM | POA: Diagnosis not present

## 2023-08-07 DIAGNOSIS — E46 Unspecified protein-calorie malnutrition: Secondary | ICD-10-CM

## 2023-08-07 DIAGNOSIS — J9611 Chronic respiratory failure with hypoxia: Secondary | ICD-10-CM | POA: Diagnosis not present

## 2023-08-07 DIAGNOSIS — J690 Pneumonitis due to inhalation of food and vomit: Secondary | ICD-10-CM | POA: Diagnosis not present

## 2023-08-07 DIAGNOSIS — B370 Candidal stomatitis: Secondary | ICD-10-CM

## 2023-08-07 DIAGNOSIS — L89159 Pressure ulcer of sacral region, unspecified stage: Secondary | ICD-10-CM

## 2023-08-07 DIAGNOSIS — G8250 Quadriplegia, unspecified: Secondary | ICD-10-CM | POA: Diagnosis not present

## 2023-08-07 DIAGNOSIS — I5030 Unspecified diastolic (congestive) heart failure: Secondary | ICD-10-CM

## 2023-08-07 DIAGNOSIS — J9809 Other diseases of bronchus, not elsewhere classified: Secondary | ICD-10-CM | POA: Diagnosis present

## 2023-08-07 DIAGNOSIS — N390 Urinary tract infection, site not specified: Secondary | ICD-10-CM

## 2023-08-07 DIAGNOSIS — G35 Multiple sclerosis: Secondary | ICD-10-CM

## 2023-08-07 LAB — CBC
HCT: 45.1 % (ref 36.0–46.0)
Hemoglobin: 14.8 g/dL (ref 12.0–15.0)
MCH: 30.5 pg (ref 26.0–34.0)
MCHC: 32.8 g/dL (ref 30.0–36.0)
MCV: 92.8 fL (ref 80.0–100.0)
Platelets: 224 10*3/uL (ref 150–400)
RBC: 4.86 MIL/uL (ref 3.87–5.11)
RDW: 14.3 % (ref 11.5–15.5)
WBC: 6.9 10*3/uL (ref 4.0–10.5)
nRBC: 0 % (ref 0.0–0.2)

## 2023-08-07 LAB — BLOOD CULTURE ID PANEL (REFLEXED) - BCID2

## 2023-08-07 LAB — BASIC METABOLIC PANEL WITH GFR
Anion gap: 12 (ref 5–15)
BUN: 11 mg/dL (ref 8–23)
CO2: 24 mmol/L (ref 22–32)
Calcium: 8.5 mg/dL — ABNORMAL LOW (ref 8.9–10.3)
Chloride: 100 mmol/L (ref 98–111)
Creatinine, Ser: 0.39 mg/dL — ABNORMAL LOW (ref 0.44–1.00)
GFR, Estimated: >60 mL/min (ref >60)
Glucose, Bld: 80 mg/dL (ref 70–99)
Potassium: 4 mmol/L (ref 3.5–5.1)
Sodium: 136 mmol/L (ref 135–145)

## 2023-08-07 LAB — URINE CULTURE: Culture: >=100000 — AB

## 2023-08-07 LAB — GLUCOSE, CAPILLARY: Glucose-Capillary: 105 mg/dL — ABNORMAL HIGH (ref 70–99)

## 2023-08-07 LAB — VITAMIN B12: Vitamin B-12: 933 pg/mL — ABNORMAL HIGH (ref 180–914)

## 2023-08-07 LAB — PROCALCITONIN: Procalcitonin: <0.1 ng/mL

## 2023-08-07 LAB — FOLATE: Folate: 39.2 ng/mL (ref >5.9)

## 2023-08-07 LAB — HIV ANTIBODY (ROUTINE TESTING W REFLEX): HIV Screen 4th Generation wRfx: NONREACTIVE

## 2023-08-07 MED ORDER — IPRATROPIUM-ALBUTEROL 0.5-2.5 (3) MG/3ML IN SOLN
3.0000 mL | Freq: Four times a day (QID) | RESPIRATORY_TRACT | Status: DC
  Administered 2023-08-07 (×3): 3 mL via RESPIRATORY_TRACT
  Filled 2023-08-07 (×3): qty 3

## 2023-08-07 MED ORDER — FAMOTIDINE 20 MG PO TABS
20.0000 mg | ORAL_TABLET | Freq: Every day | ORAL | Status: DC
  Administered 2023-08-07 – 2023-08-11 (×4): 20 mg via ORAL
  Filled 2023-08-07 (×5): qty 1

## 2023-08-07 MED ORDER — DOCUSATE SODIUM 100 MG PO CAPS
100.0000 mg | ORAL_CAPSULE | Freq: Two times a day (BID) | ORAL | Status: DC
  Filled 2023-08-07: qty 1

## 2023-08-07 MED ORDER — DEXTROSE IN LACTATED RINGERS 5 % IV SOLN: INTRAVENOUS | Status: AC

## 2023-08-07 MED ORDER — SODIUM CHLORIDE 3 % IN NEBU
4.0000 mL | INHALATION_SOLUTION | Freq: Two times a day (BID) | RESPIRATORY_TRACT | Status: AC
  Administered 2023-08-07 – 2023-08-09 (×4): 4 mL via RESPIRATORY_TRACT
  Filled 2023-08-07 (×7): qty 4

## 2023-08-07 MED ORDER — MAGNESIUM SULFATE 2 GM/50ML IV SOLN
2.0000 g | Freq: Once | INTRAVENOUS | Status: AC
  Administered 2023-08-07: 2 g via INTRAVENOUS
  Filled 2023-08-07: qty 50

## 2023-08-07 MED ORDER — NYSTATIN 100000 UNIT/ML MT SUSP
5.0000 mL | Freq: Four times a day (QID) | OROMUCOSAL | Status: DC
  Administered 2023-08-07 – 2023-08-12 (×10): 500000 [IU] via ORAL
  Filled 2023-08-07 (×13): qty 5

## 2023-08-07 NOTE — Progress Notes (Signed)
 Cymbalta and Myrbetriq not given. Pt cannot swallow pills whole, and those mediations should not be crushed. RN spoke with pharmacy said to hold medication and there ae no alternatives form

## 2023-08-07 NOTE — Evaluation (Signed)
 Clinical Swallow Evaluation   Pt presents with a hx of moderate oropharyngeal dysphagia and silent aspiration of thin liquids per MBSS completed 01/22/23 during prior admission. A mechanical soft diet and nectar-thick liquids was recommended at that time. Per reports, pt has been noncompliant with nectar-thick liquids at home and maintains a diet of soft solids and thin liquids. Oral care is completed by personal care assistants x2 per day with a manual toothbrush per daughter. Pt is encouraged to use her electric toothbrush, but pt reported it  causes discomfort. Pt with recent admission where she was assessed clinically by SLP on 08/02/23 with recommendation of a mechanical soft diet and thin liquids  given her noncompliance with thickened liquids. It is concerning that pt is admitted with diagnosis of aspiration pneumonia of L lung.  Chest CT today revealed 1. Retained or Aspirated secretions in the trachea with opacified left mainstem bronchus and subtotal left lung collapse/consolidation. Consider mucous plug versus aspiration pneumonia. Possible small layering right pleural effusion.  2. Right lung better aerated with mild dependent atelectasis, but patchy peripheral peribronchial opacity in the right middle and lower lobes could be developing infection.  Per clinical swallow assessment, no overt or subtle s/s of aspiration were observed; however, pt has a known hx of silent aspiration of thin liquids. We discussed starting a mechanical soft diet and thin liquids today with MBSS scheduled for tomorrow morning. We discussed a repeat MBSS would give pt further information on her current swallow function and determine if thickened liquids are still the safest option. Pt in agreement with plan.   Plan: MBSS 4/18 to determine safest diet recommendation. It will be up to pt and daughter to make final decision on diet modifications (I.e. thickened liquids) based on MBSS results and pt's goals of care.     General - 08/07/23 1050       General Information   Date of Onset 08/07/23    HPI Dominigue Gellner is 78 y.o. female admitted for aspiration pneumonia of left lung. Recent prior admission for altered mental status likely secondary to infectious encephalopathy (urosepsis). Pertinent PMH/PSH includes MS, spastic quadriplegia, CHF s/p pacemaker placement. Pt had a MBS on 01/22/23 that indicated penetration and aspiration with thins (silent) and nectar thick and deep penetration with HTL. SLP recommended NTL to reduce likelihood of repeated aspiration. Per family reports, the pt did not adhere to nectar thick liquid while at home and has been on thin liquid.    Type of Study Bedside Swallow Evaluation    Previous Swallow Assessment Recent CSE (08/04/23); MBS 01/22/23    Diet Prior to this Study NPO    Temperature Spikes Noted No    Respiratory Status Nasal cannula    History of Recent Intubation No    Behavior/Cognition Cooperative;Pleasant mood;Requires cueing    Oral Cavity - Dentition Adequate natural dentition    Self-Feeding Abilities Total assist    Patient Positioning Upright in bed    Baseline Vocal Quality Low vocal intensity    Volitional Cough Weak    Volitional Swallow Able to elicit

## 2023-08-07 NOTE — ED Notes (Signed)
 Consent signed w/ MD, primary RN, and pt daughter. Consent given to secretary to scan into pts chart

## 2023-08-07 NOTE — Consult Note (Addendum)
 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 She is not back to baseline according to her daughter and has low oral intake and lethargy. Last night she aspirated on her meds. Denied f/c/r, URTI symptoms, wheezing, CP, abd pain, N/V/D, rash, and LL edema. She has dry cough and mild SOB. Urine has sediment and changing the suprapubic cath q 3 weeks (due next week). Denied smoking, alcohol drinking, and illicit drug use. She is bedbound. Discharged on home O2 @ 3.5 L 3 days ago but she is not using it.   Pertinent  Medical History  Aspiration PNA and UTI in Oct 2024, UTI 08/01/2023, 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, Echo: EF 65%, G1DD), SVT ablation, complete heart block s/p pacemaker, hypothyroidism, h/o gastric bypass, esophageal dysmotility, suprapubic catheter for neurogenic bladder and multiple UTIs (last one was one week ago), multiple sclerosis, constipation, moderate PCM, post-traumatic quadriplegia  Significant Hospital Events: Including procedures, antibiotic start and stop dates in addition to other pertinent events   4/16: ED eval, admitted, Zosyn started for UTI and aspiration PNA, IVF 4/17: CT chest wo contrast: noted. Retained or Aspirated secretions in the trachea with opacified left mainstem bronchus and subtotal left lung collapse/consolidation. Consider mucous plug versus aspiration pneumonia. Possible small  layering right pleural effusion. Right lung better aerated with mild dependent atelectasis, but patchy peripheral peribronchial opacity in the right middle and lower lobes could be developing infection. PCCM was consulted 4/17: failed ST eval and MBS is ordered for tomorrow  Interim History / Subjective:    Objective   Blood pressure 138/70, pulse 63, temperature 98.2 F (36.8 C), temperature source Oral, resp. rate 15, height 5' 4.5" (1.638 m), weight 68 kg, SpO2 96%.       No intake or output data in the 24 hours ending 08/07/23 1217 Filed Weights   08/06/23 1213  Weight: 68 kg    Examination: General: alert, oriented x3 (daughter, location, year, missed month and day), and comfortable. On 3 L [place]. SpO2 98%  HENT: PERL, thrush. No LNE or thyromegaly. No JVD. Scar of trach 2012 Lungs: decreased air entry on the left side and basal crackles. No wheezing.  Cardiovascular: NL S1/S2. No m/g/r. Paced rhythm  Abdomen: no distension or tenderness. Suprapubic cath Extremities: trace feet edema. Symmetrical  Neuro: symmetrical facial appearance. Spatic upper limbs and quadriplegia    Resolved Hospital Problem list     Assessment & Plan:  Chronic hypoxic resp failure (home O2 @ 3.5 L [place]) Left sided subtotal left lung collapse/consolidation, mucous plugs, and possible aspiration PNA -O2 for SpO2 >92% -Flex bronchoscopy/BAL/therapeutic suctioing, consent was obtained from daughter  -Pulm hygiene -Duoneb and HTS neb -Zosyn  -f/u Bcx2 and UCx -PCT -MRSA screening -HOB elevation -Aspiration precautions   -MBS in am (per SP eval)  Possible complicated UTI, s/p suprapubic catheter for neurogenic bladder -f/u Cx -Zosyn -IVF  Oral thrush -Nystatin   Metabolic encephalopathy due to infection and poor hydration/intake  High aspiration risk  with poor oral intake and moderate PCM -Dietitian consult and consider PEG (d/w daughter)  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, Echo: EF 65%, G1DD), SVT ablation,  CHB s/p PPM Hypothyroidism Esophageal dysmotility/gastric bypass surgery -H2B  Multiple sclerosis Constipation -Laxatives Post-traumatic quadriplegia   FULL CODE  Best Practice (right click and "Reselect all SmartList Selections" daily)   Diet/type: NPO DVT prophylaxis LMWH Pressure ulcer(s): N/A GI prophylaxis: H2B Lines: N/A Foley:  Yes, and it is still needed Code Status:  full code Last date of multidisciplinary goals of care discussion []   Labs   CBC: Recent Labs  Lab 08/01/23 1518 08/02/23 0919 08/03/23 1033 08/04/23 0650 08/06/23 1150 08/06/23 1211 08/07/23 0500  WBC 11.8* 8.1 7.3 6.5 6.1  --  6.9  NEUTROABS 9.3*  --   --   --  3.4  --   --   HGB 14.9 14.0 14.3 14.2 14.4 14.6 14.8  HCT 45.9 43.1 43.4 44.8 45.2 43.0 45.1  MCV 95.0 93.5 92.3 94.3 94.8  --  92.8  PLT 242 220 212 199 217  --  224    Basic Metabolic Panel: Recent Labs  Lab 08/02/23 0919 08/03/23 1033 08/04/23 0650 08/06/23 1150 08/06/23 1211 08/07/23 0500  NA 141 139 141 141 139 136  K 3.9 3.6 4.0 4.5 4.8 4.0  CL 104 104 109 105  --  100  CO2 22 24 22 28   --  24  GLUCOSE 84 132* 125* 101*  --  80  BUN 18 20 19 13   --  11  CREATININE 0.42* 0.36* 0.35* 0.33*  --  0.39*  CALCIUM 9.1 8.7* 8.5* 8.9  --  8.5*   GFR: Estimated Creatinine Clearance: 56.4 mL/min (A) (by C-G formula based on SCr of 0.39 mg/dL (L)). Recent Labs  Lab 08/01/23 1539 08/02/23 0919 08/03/23 1033 08/04/23 0650 08/06/23 1150 08/06/23 1212 08/07/23 0500  WBC  --    < > 7.3 6.5 6.1  --  6.9  LATICACIDVEN 1.3  --   --   --   --  1.1  --    < > = values in this interval not displayed.    Liver Function Tests: Recent Labs  Lab 08/01/23 1518 08/02/23 0919 08/03/23 1033 08/06/23 1150  AST 35 20 17 24   ALT 23 22 18 14   ALKPHOS 50 44 41 42  BILITOT 1.4* 1.0 0.8 0.5  PROT 6.2* 5.9* 6.2* 5.9*  ALBUMIN 3.2* 3.0* 2.8* 2.8*   Recent Labs  Lab 08/01/23 1518  LIPASE 22   Recent Labs  Lab 08/01/23 1934   AMMONIA 20    ABG    Component Value Date/Time   PHART 7.273 (L) 01/18/2023 1310   PCO2ART 59.7 (H) 01/18/2023 1310   PO2ART 140 (H) 01/18/2023 1310   HCO3 32.6 (H) 08/06/2023 1211   TCO2 34 (H) 08/06/2023 1211   O2SAT 99 08/06/2023 1211     Coagulation Profile: No results for input(s): "INR", "PROTIME" in the last 168 hours.  Cardiac Enzymes: No results for input(s): "CKTOTAL", "CKMB", "CKMBINDEX", "TROPONINI" in the last 168 hours.  HbA1C: No results found for: "HGBA1C"  CBG: No results for input(s): "GLUCAP" in the last 168 hours.  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   Surgical History:   Past Surgical History:  Procedure Laterality Date   ABDOMINAL HYSTERECTOMY     ABDOMINAL SURGERY     APPENDECTOMY     BACK SURGERY     bladder sling     BREAST SURGERY Right    CATARACT EXTRACTION, BILATERAL     CHOLECYSTECTOMY     ELBOW SURGERY Bilateral    NECK SURGERY     x 2   right thumb surgery     ROUX-EN-Y GASTRIC BYPASS     Peptic Ulcer   scar tissue removal     SHOULDER SURGERY Right    SMALL INTESTINE SURGERY     TONSILLECTOMY AND ADENOIDECTOMY     TRACHEAL SURGERY     WRIST SURGERY Bilateral      Social History:   reports that she has never smoked. She has never used smokeless tobacco. She reports that she does not drink alcohol and does not use drugs.   Family History:  Her family history includes Other in her  father; Thyroid cancer in her mother.   Allergies No Known Allergies   Home Medications  Prior to Admission medications   Medication Sig Start Date End Date Taking? Authorizing Provider  Ascorbic Acid (VITAMIN C PO) Take 1 tablet by mouth 2 (two) times daily with a meal.   Yes [provider]  baclofen (LIORESAL) 20 MG tablet Take 1 tablet (20 mg total) by mouth 3 (three) times daily. 01/24/23  Yes Dorthy Gavia, MD  bisacodyl (DULCOLAX) 10 MG suppository Place 10 mg rectally at bedtime as needed (constipation).   Yes [provider]  Cholecalciferol (VITAMIN D-3 PO) Take 1 tablet by mouth daily with supper.   Yes [provider]  collagenase (SANTYL) 250 UNIT/GM ointment Apply 1 Application topically daily as needed (wound healing).   Yes [provider]  cyanocobalamin (,VITAMIN B-12,) 1000 MCG/ML injection Inject 1,000 mcg into the muscle every 30 (thirty) days. 05/14/19  Yes [provider]  diclofenac Sodium (VOLTAREN) 1 % GEL Apply 2 g topically 4 (four) times daily as needed (pain).   Yes [provider]  DULoxetine (CYMBALTA) 60 MG capsule TAKE ONE CAPSULE BY MOUTH EVERY MORNING AFTER MEAL Patient taking differently: Take 60 mg by mouth daily. 07/21/23  Yes Yan, Yijun, MD  fluticasone (FLONASE) 50 MCG/ACT nasal spray Place 1 spray into both nostrils daily as needed for allergies. 07/15/23  Yes [provider]  gabapentin (NEURONTIN) 600 MG tablet Take 1,200 mg by mouth 3 (three) times daily. 03/02/19  Yes [provider]  levocetirizine (XYZAL) 5 MG tablet Take 5 mg by mouth at bedtime. 04/27/19  Yes [provider]  levothyroxine (SYNTHROID) 25 MCG tablet Take 25 mcg by mouth daily before breakfast. 04/25/19  Yes [provider]  megestrol (MEGACE ES) 625 MG/5ML suspension Take 625 mg by mouth daily as needed (appetite stimulant).   Yes [provider]  mirabegron ER (MYRBETRIQ) 50 MG TB24  tablet Take 50 mg by mouth daily with lunch.   Yes [provider]  montelukast (SINGULAIR) 10 MG tablet Take 10 mg by mouth at bedtime. 06/26/20  Yes [provider]  Multiple Vitamins-Minerals (MULTIVITAMIN WOMEN 50+) TABS Take 1 tablet by mouth daily with supper.   Yes [provider]  OXYGEN Inhale 1 L/min into the lungs at  bedtime.   Yes [provider]  polyethylene glycol (MIRALAX / GLYCOLAX) 17 g packet Take 17 g by mouth daily.   Yes [provider]  solifenacin (VESICARE) 5 MG tablet Take 5 mg by mouth daily with lunch.   Yes [provider]  traMADol (ULTRAM) 50 MG tablet Take 50 mg by mouth every 8 (eight) hours as needed for moderate pain (pain score 4-6).   Yes [provider]  traZODone (DESYREL) 50 MG tablet Take 100 mg by mouth at bedtime. 05/12/19  Yes [provider]  Wound Dressings (ALLEVYN SACRUM EX) Apply 1 application  topically daily as needed (thinning skin/skin breakdown on sacrum).   Yes [provider]  nitrofurantoin (MACRODANTIN) 50 MG capsule Take 2 capsules (100 mg total) by mouth 4 (four) times daily for 10 doses. Open each capsule. Okay to mix in applesauce or pudding. Patient not taking: Reported on 08/06/2023 08/05/23 08/08/23  Ivin Marrow, MD    Arlyne Bering, MD [facility]

## 2023-08-07 NOTE — Progress Notes (Signed)
 Daily Progress Note Intern Pager: [phone]  Patient name: [name] Medical record number: [id number] Date of birth: Feb 12, 1946 Age: 78 y.o. Gender: female  Primary Care Provider: Ladora Daniel, PA-C Consultants: None Code Status: Full  Pt Overview and Major Events to Date:  4/16: Admitted   Assessment and Plan:  Jonette Pesa 78 y.o. F admitted for AMS with workup concerning for aspiration pna vs mucous plug. Also has known UTI. Started on Zosyn and fluids. PMH includes MS, spastic quadriplegia, complete heart block s/p pacemaker.  Assessment & Plan Altered mental status  PNA Improving AMS this morning. CT chest with opacified L mainstem bronchus and subtotal left lung collapse concerning for aspiration pneumonia vs mucous plug. Previous CT Neck on 4/12 with layering secretions within subglottic airway. 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  - PCCM consulted - appreciate recs - Continue Zosyn per pharmacy  - Follow up ammonia  - Follow up blood cultures  - HIV/RPR, vit B, folate, thiamine - Continuous cardiac monitoring  - q4 neuro checks - O2 supplementation as needed  - Delirium precautions  - NPO while altered - MBS tomorrow per SLP  - AM BMP UTI (urinary tract infection) UA with leuks, Hgb, RBC and WBC. Recently admitted with Citrobacter UTI, sensitive to zosyn.  Previously prescribed nitrofurantoin, will treat with Zosyn at this time for coverage of UTI and concern for PNA as above.  - Zosyn per pharmacy as above  - Fu urine culture  Alkalosis pH 7.566 with Bicarb 32.6 on arrival. Suspect dehydration in setting of decreased PO intake contributing to possible contraction alkalosis. Improving mental status today.  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 Holding home tramadol, trazodone,  gabapentin.  Neurogenic bladder: Suprapubic catheter. Home Vesicare 5 (Toviaz 4mg  is formulary equivalent) daily and Mirbegron ER 50 daily.  Hypothyroidism: Home synthroid 25 daily.  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 Patient alone in room.   Objective: Temp:  [98.3 F (36.8 C)-99.5 F (37.5 C)] 98.3 F (36.8 C) (04/17 0610) Pulse Rate:  [48-93] 61 (04/17 0500) Resp:  [12-19] 16 (04/17 0500) BP: (108-158)/(65-114) 149/66 (04/17 0500) SpO2:  [93 %-99 %] 98 % (04/17 0500) Weight:  [68 kg] 68 kg (04/16 1213) Physical Exam: General: Well-appearing. Resting comfortably in room. CV: Normal S1/S2. No extra heart sounds. Pulm: Breathing comfortably on 2L O2. CTAB anteriorly. No increased WOB. Abd: Soft, non-tender, non-distended. Skin/Ext:  Warm, dry. Chronically contracted upper extremities.  Neuro: Increasing alertness. Oriented to self, birthday, place. Not oriented to time. PEERL. Otherwise unable to follow commands.   Laboratory: Most recent CBC Lab Results  Component Value Date   WBC 6.1 08/06/2023   HGB 14.6 08/06/2023   HCT 43.0 08/06/2023   MCV 94.8 08/06/2023   PLT 217 08/06/2023      Latest Ref Rng & Units 08/06/2023   12:11 PM 08/06/2023   11:50 AM 08/04/2023    6:50 AM  BMP  Glucose 70 - 99 mg/dL  147  829  BUN 8 - 23 mg/dL  13  19   Creatinine 0.98 - 1.00 mg/dL  1.19  1.47   Sodium 829 - 145 mmol/L 139  141  141   Potassium 3.5 - 5.1 mmol/L 4.8  4.5  4.0   Chloride 98 - 111 mmol/L  105  109   CO2 22 - 32 mmol/L  28  22   Calcium 8.9 - 10.3 mg/dL  8.9  8.5     Imaging/Diagnostic Tests: CT Chest: Retained and aspirated secretions in trache with  opacified left mainstem bronchus and subtotal left lung collapse/consolidation  Carey Chapman, MD 08/07/2023, 7:03 AM  PGY-1, [facility] FPTS Intern pager: [phone], text pages welcome Secure chat group [facility] Teaching Service

## 2023-08-07 NOTE — Assessment & Plan Note (Addendum)
 Improving AMS this morning. CT chest with opacified L mainstem bronchus and subtotal left lung collapse concerning for aspiration pneumonia vs mucous plug. Previous CT Neck on 4/12 with layering secretions within subglottic airway. 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  - PCCM consulted - appreciate recs - Continue Zosyn per pharmacy  - Follow up ammonia  - Follow up blood cultures  - HIV/RPR, vit B, folate, thiamine - Continuous cardiac monitoring  - q4 neuro checks - O2 supplementation as needed  - Delirium precautions  - NPO while altered - MBS tomorrow per SLP  - AM BMP

## 2023-08-07 NOTE — Assessment & Plan Note (Addendum)
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 Holding home tramadol, trazodone, gabapentin.  Neurogenic bladder: Suprapubic catheter. Home Vesicare 5 (Toviaz 4mg  is formulary equivalent) daily and Mirbegron ER 50 daily.  Hypothyroidism: Home synthroid 25 daily.  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

## 2023-08-07 NOTE — Assessment & Plan Note (Signed)
 UA with leuks, Hgb, RBC and WBC. Recently admitted with Citrobacter UTI, sensitive to zosyn.  Previously prescribed nitrofurantoin, will treat with Zosyn at this time for coverage of UTI and concern for PNA as above.  - Zosyn per pharmacy as above  - Fu urine culture

## 2023-08-07 NOTE — ED Notes (Addendum)
 Pt back from CT

## 2023-08-07 NOTE — ED Notes (Signed)
 Pt taken to CT.

## 2023-08-07 NOTE — ED Notes (Signed)
 Phlebotomist unsuccessful at attempts to obtain AM labs - hospitalist made aware.

## 2023-08-07 NOTE — ED Notes (Signed)
 This RN attempted to collect morning labs unsuccessfully. Lab made aware and will attempt.

## 2023-08-07 NOTE — ED Notes (Signed)
 Informed consent uploaded to the patient's chart. A hard copy was placed in the medical records drawer

## 2023-08-07 NOTE — Progress Notes (Signed)
 PHARMACY - PHYSICIAN COMMUNICATION CRITICAL VALUE ALERT - BLOOD CULTURE IDENTIFICATION (BCID)  [name] is an 77 y.o. female who presented to [facility] on 08/06/2023 with a chief complaint of AMS  Assessment:  likely containant (include suspected source if known)  Name of physician (or Provider) Contacted: FMTS  Current antibiotics: Zosyn  Changes to prescribed antibiotics recommended:  Patient is on recommended antibiotics - No changes needed  Results for orders placed or performed during the hospital encounter of 08/06/23  Blood Culture ID Panel (Reflexed) (Collected: 08/06/2023 11:50 AM)  Result Value Ref Range   Enterococcus faecalis NOT DETECTED NOT DETECTED   Enterococcus Faecium NOT DETECTED NOT DETECTED   Listeria monocytogenes NOT DETECTED NOT DETECTED   Staphylococcus species DETECTED (A) NOT DETECTED   Staphylococcus aureus (BCID) NOT DETECTED NOT DETECTED   Staphylococcus epidermidis DETECTED (A) NOT DETECTED   Staphylococcus lugdunensis NOT DETECTED NOT DETECTED   Streptococcus species NOT DETECTED NOT DETECTED   Streptococcus agalactiae NOT DETECTED NOT DETECTED   Streptococcus pneumoniae NOT DETECTED NOT DETECTED   Streptococcus pyogenes NOT DETECTED NOT DETECTED   A.calcoaceticus-baumannii NOT DETECTED NOT DETECTED   Bacteroides fragilis NOT DETECTED NOT DETECTED   Enterobacterales NOT DETECTED NOT DETECTED   Enterobacter cloacae complex NOT DETECTED NOT DETECTED   Escherichia coli NOT DETECTED NOT DETECTED   Klebsiella aerogenes NOT DETECTED NOT DETECTED   Klebsiella oxytoca NOT DETECTED NOT DETECTED   Klebsiella pneumoniae NOT DETECTED NOT DETECTED   Proteus species NOT DETECTED NOT DETECTED   Salmonella species NOT DETECTED NOT DETECTED   Serratia marcescens NOT DETECTED NOT DETECTED   Haemophilus influenzae NOT DETECTED NOT DETECTED   Neisseria meningitidis NOT DETECTED NOT DETECTED   Pseudomonas aeruginosa NOT DETECTED NOT DETECTED    Stenotrophomonas maltophilia NOT DETECTED NOT DETECTED   Candida albicans NOT DETECTED NOT DETECTED   Candida auris NOT DETECTED NOT DETECTED   Candida glabrata NOT DETECTED NOT DETECTED   Candida krusei NOT DETECTED NOT DETECTED   Candida parapsilosis NOT DETECTED NOT DETECTED   Candida tropicalis NOT DETECTED NOT DETECTED   Cryptococcus neoformans/gattii NOT DETECTED NOT DETECTED   Methicillin resistance mecA/C DETECTED (A) NOT DETECTED    [name] 08/07/2023  4:21 PM

## 2023-08-07 NOTE — Progress Notes (Signed)
 Patient arrived ty 3W07 from ED at 1700. Daughter Tyra Galley at bedside. GCS 14 on 3 LNC. Skin assessment completed. Tele placed and ccmd called. Bed in lowest position call bell in reach for daughter. Bed alarm on. Suction set up.

## 2023-08-07 NOTE — Assessment & Plan Note (Addendum)
 pH 7.566 with Bicarb 32.6 on arrival. Suspect dehydration in setting of decreased PO intake contributing to possible contraction alkalosis. Improving mental status today.  - Continue LR 100ml/hr (s/p 500mL bolus in ED)  - Consider rechecking VBG for worsening mental status  - AM BMP

## 2023-08-08 ENCOUNTER — Inpatient Hospital Stay (HOSPITAL_COMMUNITY): Admitting: Anesthesiology

## 2023-08-08 ENCOUNTER — Encounter (HOSPITAL_COMMUNITY)
Admission: EM | Disposition: A | Discharge status: Hospice | Payer: Self-pay | Source: Home / Self Care | Attending: Family Medicine

## 2023-08-08 ENCOUNTER — Inpatient Hospital Stay (HOSPITAL_COMMUNITY)

## 2023-08-08 DIAGNOSIS — R7989 Other specified abnormal findings of blood chemistry: Secondary | ICD-10-CM | POA: Diagnosis not present

## 2023-08-08 DIAGNOSIS — I639 Cerebral infarction, unspecified: Secondary | ICD-10-CM

## 2023-08-08 DIAGNOSIS — I10 Essential (primary) hypertension: Secondary | ICD-10-CM

## 2023-08-08 DIAGNOSIS — J9611 Chronic respiratory failure with hypoxia: Secondary | ICD-10-CM | POA: Diagnosis not present

## 2023-08-08 DIAGNOSIS — J9809 Other diseases of bronchus, not elsewhere classified: Secondary | ICD-10-CM | POA: Diagnosis not present

## 2023-08-08 DIAGNOSIS — J189 Pneumonia, unspecified organism: Secondary | ICD-10-CM | POA: Diagnosis not present

## 2023-08-08 DIAGNOSIS — E039 Hypothyroidism, unspecified: Secondary | ICD-10-CM | POA: Diagnosis not present

## 2023-08-08 DIAGNOSIS — R410 Disorientation, unspecified: Secondary | ICD-10-CM | POA: Diagnosis not present

## 2023-08-08 DIAGNOSIS — J690 Pneumonitis due to inhalation of food and vomit: Secondary | ICD-10-CM | POA: Diagnosis not present

## 2023-08-08 HISTORY — PX: BRONCHIAL WASHINGS: SHX5105

## 2023-08-08 HISTORY — PX: FLEXIBLE BRONCHOSCOPY: SHX5094

## 2023-08-08 LAB — ECHOCARDIOGRAM COMPLETE
AR max vel: 1.97 cm2
AV Area VTI: 2.9 cm2
AV Area mean vel: 1.94 cm2
AV Mean grad: 7 mmHg
AV Peak grad: 14.6 mmHg
Ao pk vel: 1.91 m/s
Area-P 1/2: 3.16 cm2
Calc EF: 63.8 %
Height: 64.5 in
MV VTI: 4.06 cm2
S' Lateral: 2.35 cm
Single Plane A2C EF: 64.8 %
Single Plane A4C EF: 60.8 %
Weight: 2400.02 [oz_av]

## 2023-08-08 LAB — GLUCOSE, CAPILLARY
Glucose-Capillary: 115 mg/dL — ABNORMAL HIGH (ref 70–99)
Glucose-Capillary: 120 mg/dL — ABNORMAL HIGH (ref 70–99)
Glucose-Capillary: 172 mg/dL — ABNORMAL HIGH (ref 70–99)
Glucose-Capillary: 198 mg/dL — ABNORMAL HIGH (ref 70–99)

## 2023-08-08 LAB — BASIC METABOLIC PANEL WITH GFR
Anion gap: 13 (ref 5–15)
BUN: 11 mg/dL (ref 8–23)
CO2: 24 mmol/L (ref 22–32)
Calcium: 8.6 mg/dL — ABNORMAL LOW (ref 8.9–10.3)
Chloride: 99 mmol/L (ref 98–111)
Creatinine, Ser: 0.47 mg/dL (ref 0.44–1.00)
GFR, Estimated: >60 mL/min (ref >60)
Glucose, Bld: 123 mg/dL — ABNORMAL HIGH (ref 70–99)
Potassium: 3.6 mmol/L (ref 3.5–5.1)
Sodium: 136 mmol/L (ref 135–145)

## 2023-08-08 LAB — CULTURE, BLOOD (ROUTINE X 2)

## 2023-08-08 LAB — RPR: RPR Ser Ql: NONREACTIVE

## 2023-08-08 SURGERY — BRONCHOSCOPY, FLEXIBLE
Anesthesia: General | Laterality: Bilateral

## 2023-08-08 MED ORDER — SODIUM CHLORIDE 0.9 % IV SOLN: INTRAVENOUS | Status: DC | PRN

## 2023-08-08 MED ORDER — PHENYLEPHRINE HCL-NACL 20-0.9 MG/250ML-% IV SOLN
INTRAVENOUS | Status: DC | PRN
  Administered 2023-08-08: 160 ug via INTRAVENOUS
  Administered 2023-08-08: 50 ug/min via INTRAVENOUS

## 2023-08-08 MED ORDER — DEXAMETHASONE SODIUM PHOSPHATE 10 MG/ML IJ SOLN
INTRAMUSCULAR | Status: DC | PRN
  Administered 2023-08-08: 5 mg via INTRAVENOUS

## 2023-08-08 MED ORDER — IPRATROPIUM-ALBUTEROL 0.5-2.5 (3) MG/3ML IN SOLN
3.0000 mL | Freq: Two times a day (BID) | RESPIRATORY_TRACT | Status: DC
  Administered 2023-08-08 – 2023-08-14 (×12): 3 mL via RESPIRATORY_TRACT
  Filled 2023-08-08 (×14): qty 3

## 2023-08-08 MED ORDER — FENTANYL CITRATE (PF) 100 MCG/2ML IJ SOLN
INTRAMUSCULAR | Status: DC | PRN
  Administered 2023-08-08: 50 ug via INTRAVENOUS

## 2023-08-08 MED ORDER — FENTANYL CITRATE (PF) 100 MCG/2ML IJ SOLN
INTRAMUSCULAR | Status: AC
  Filled 2023-08-08: qty 2

## 2023-08-08 MED ORDER — ONDANSETRON HCL 4 MG/2ML IJ SOLN
INTRAMUSCULAR | Status: DC | PRN
  Administered 2023-08-08: 4 mg via INTRAVENOUS

## 2023-08-08 MED ORDER — ROCURONIUM BROMIDE 10 MG/ML (PF) SYRINGE
PREFILLED_SYRINGE | INTRAVENOUS | Status: DC | PRN
  Administered 2023-08-08: 40 mg via INTRAVENOUS

## 2023-08-08 MED ORDER — PHENYLEPHRINE 80 MCG/ML (10ML) SYRINGE FOR IV PUSH (FOR BLOOD PRESSURE SUPPORT)
PREFILLED_SYRINGE | INTRAVENOUS | Status: DC | PRN
  Administered 2023-08-08: 80 ug via INTRAVENOUS

## 2023-08-08 MED ORDER — LIDOCAINE 2% (20 MG/ML) 5 ML SYRINGE
INTRAMUSCULAR | Status: DC | PRN
  Administered 2023-08-08: 40 mg via INTRAVENOUS

## 2023-08-08 MED ORDER — SUGAMMADEX SODIUM 200 MG/2ML IV SOLN
INTRAVENOUS | Status: DC | PRN
  Administered 2023-08-08: 200 mg via INTRAVENOUS

## 2023-08-08 MED ORDER — CHLORHEXIDINE GLUCONATE CLOTH 2 % EX PADS
6.0000 | MEDICATED_PAD | Freq: Every day | CUTANEOUS | Status: DC
  Administered 2023-08-09 – 2023-08-14 (×6): 6 via TOPICAL

## 2023-08-08 MED ORDER — PROPOFOL 10 MG/ML IV BOLUS
INTRAVENOUS | Status: DC | PRN
  Administered 2023-08-08: 70 mg via INTRAVENOUS

## 2023-08-08 MED ORDER — PROPOFOL 500 MG/50ML IV EMUL
INTRAVENOUS | Status: DC | PRN
  Administered 2023-08-08: 100 ug/kg/min via INTRAVENOUS

## 2023-08-08 MED ORDER — DEXTROSE IN LACTATED RINGERS 5 % IV SOLN: INTRAVENOUS | Status: AC

## 2023-08-08 NOTE — Progress Notes (Signed)
 Pharmacy Antibiotic Note  [name] is a 78 y.o. female admitted on 08/06/2023 with Aspiriation PNA, CAP, UTI. Pharmacy has been consulted for Zosyn  dosing.   Currently afebrile, 02 sat stable at 94 at 3L [place] (RA at home). No new CBC; most recent WBC normal at 6.9 on 4/17. S/p bronchoscopy with copious amount of thick purulent secretion on L and R bronchi with secretions suctioned to clarity. Left bronchial washing sent for cultures.   Plan: Continue Zosyn  3.375g IV q8h (4 hour infusion). Follow cultures, length of therapy, de-escalation as able Monitor clinical progress and renal function   Height: 5\' 4"  (162.6 cm) Weight: 68 kg (149 lb 14.6 oz) IBW/kg (Calculated) : 54.7  Temp (24hrs), Avg:98.1 F (36.7 C), Min:97.6 F (36.4 C), Max:98.8 F (37.1 C)  Recent Labs  Lab 08/01/23 1539 08/02/23 0919 08/03/23 1033 08/04/23 0650 08/06/23 1150 08/06/23 1212 08/07/23 0500 08/08/23 0538  WBC  --  8.1 7.3 6.5 6.1  --  6.9  --   CREATININE  --  0.42* 0.36* 0.35* 0.33*  --  0.39* 0.47  LATICACIDVEN 1.3  --   --   --   --  1.1  --   --     Estimated Creatinine Clearance: 55.8 mL/min (by C-G formula based on SCr of 0.47 mg/dL).    No Known Allergies  Antimicrobials this admission: Zosyn  4/16 >>   Last admission: Ceftriaxone  4/11 x1 Meropenem  4/11 >> 4/14 D/C home on NTF  Microbiology results: 4/18 BAL: pending 4/16 BCx: 1/4 GPC (MRSE on BCID) - likely contam  4/16 UCx: Yeast (not typically treated) 4/11 BCx: no growth x5 days 4/11 UCx: citrobacter feundii S:Imi, NTF, Zosyn    Thank you for allowing pharmacy to be a part of this patient's care.  [name] [name] 08/08/2023 2:00 PM

## 2023-08-08 NOTE — Progress Notes (Signed)
 Transport for [employer] procedure. Via bed with 2 nurses

## 2023-08-08 NOTE — Progress Notes (Signed)
 Initial Nutrition Assessment  DOCUMENTATION CODES:  Not applicable  INTERVENTION:  Monitor for diet advancement If unable to advance diet, recommend cortrak on 4/21  NUTRITION DIAGNOSIS:   Inadequate oral intake related to inability to eat as evidenced by NPO status.  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 Found to have aspiration pneumonia.  Recent admission 4/11-4/14  Pt out of room for bronchoscopy this AM. Noted that pt made NPO after failing BSE. SLP planning to perform MBS 4/19. Reached out to resident team to to inquire about cortrak so pt could have access for medications and nutrition today and in the event that pt was not able ot have diet advanced. Prefer to wait to see results from MBS. Did inform that cortrak team would not be available again until Monday.   Will follow up with pt for nutrition assessment and physical exam at follow-up.  Admit / Current weight: 68 kg   Nutritionally Relevant Medications: Scheduled Meds:  baclofen   20 mg Oral TID   docusate sodium   100 mg Oral BID   famotidine   20 mg Oral QHS   multivitamin with minerals  1 tablet Oral Q supper   polyethylene glycol  17 g Oral Daily   Continuous Infusions:  dextrose  5% lactated ringers  40 mL/hr at 08/07/23 1824   [MAR Hold] piperacillin -tazobactam (ZOSYN )  IV 3.375 g (08/08/23 0508)   Labs Reviewed: CBG ranges from 105-120 mg/dL over the last 24 hours  NUTRITION - FOCUSED PHYSICAL EXAM: Defer to follow-up  Diet Order:   Diet Order             Diet NPO time specified Except for: Sips with Meds  Diet effective midnight                   EDUCATION  NEEDS:  Not appropriate for education at this time  Skin:  Skin Assessment: Skin Integrity Issues: Pressure injury to right foot Stage 1 left elbow MASD to the buttocks  Last BM:  4/16  Height:  Ht Readings from Last 1 Encounters:  08/08/23 5\' 4"  (1.626 m)    Weight:  Wt Readings from Last 1 Encounters:  08/08/23 68 kg    Ideal Body Weight:  54.5 kg  BMI:  Body mass index is 25.73 kg/m.  Estimated Nutritional Needs:  Kcal:  1400-1600 kcal/d Protein:  65-80 g/d Fluid:  1.5L/d    Edwena Graham, RD, LDN Registered Dietitian II Please reach out via secure chat

## 2023-08-08 NOTE — Anesthesia Procedure Notes (Signed)
 Procedure Name: Intubation Date/Time: 08/08/2023 12:22 PM  Performed by: Cindie Ronal POUR, CRNAPre-anesthesia Checklist: Patient identified, Emergency Drugs available, Suction available and Patient being monitored Patient Re-evaluated:Patient Re-evaluated prior to induction Oxygen Delivery Method: Circle System Utilized Preoxygenation: Pre-oxygenation with 100% oxygen Induction Type: IV induction Ventilation: Mask ventilation without difficulty Laryngoscope Size: Glidescope and 3 Grade View: Grade II Tube type: Oral Tube size: 8.0 mm Number of attempts: 2 Airway Equipment and Method: Stylet and Oral airway Placement Confirmation: ETT inserted through vocal cords under direct vision, positive ETCO2 and breath sounds checked- equal and bilateral Tube secured with: Tape Dental Injury: Teeth and Oropharynx as per pre-operative assessment

## 2023-08-08 NOTE — Op Note (Signed)
 Bronchoscopy Procedure Note  [name]  [id number]  06-11-1945  Date:08/08/23  Time:1:09 PM   Provider Performing:Amiley Shishido B Jena Tegeler   Procedure(s):  Flexible bronchoscopy with bronchial alveolar lavage (40981)  Indication(s) pneumonia  Consent Risks of the procedure as well as the alternatives and risks of each were explained to the patient and/or caregiver.  Consent for the procedure was obtained and is signed in the bedside chart  Anesthesia general   Time Out Verified patient identification, verified procedure, site/side was marked, verified correct patient position, special equipment/implants available, medications/allergies/relevant history reviewed, required imaging and test results available.   Sterile Technique Usual hand hygiene, masks, gowns, and gloves were used   Procedure Description Bronchoscope advanced through endotracheal tube and into airway.  Airways were examined down to subsegmental level with findings noted below.   Following diagnostic evaluation, BAL(s) performed in left bronchial tree with normal saline and return of 30mL fluid  Findings:  - copious amounts of thick purulent secretions in right and left main bronchi extending into the right and left lower lobes - Secretions suctioned to clarity - no endobronchial lesions noted   Complications/Tolerance None; patient tolerated the procedure well. Chest X-ray is not needed post procedure.   EBL Minimal   Specimen(s) Left Bronchial washings sent for cultures

## 2023-08-08 NOTE — Assessment & Plan Note (Addendum)
 Mental status this am, mildly improved.  Workup thus far negative, RPR/B1 pending. Bcx likely contaminant. - PCCM consulted - appreciate recs - Continue Zosyn  per pharmacy, 5 day course - Follow up blood cultures  - Continuous cardiac monitoring  - q4 neuro checks - O2 supplementation as needed  - Delirium precautions  - NPO while altered - MBS tomorrow per SLP  - AM BMP

## 2023-08-08 NOTE — Progress Notes (Signed)
 MD Irby Mannan notified of BP 171/97. No treatment ordered and requested to recheck in 1 hour. BP 129/80 P 88 .Remains Strict NPO after failure of swallowing exam per MD order and protocol.

## 2023-08-08 NOTE — Plan of Care (Signed)
  Problem: Education: Goal: Knowledge of General Education information will improve Description: Including pain rating scale, medication(s)/side effects and non-pharmacologic comfort measures Outcome: Progressing   Problem: Health Behavior/Discharge Planning: Goal: Ability to manage health-related needs will improve Outcome: Not Progressing   Problem: Clinical Measurements: Goal: Ability to maintain clinical measurements within normal limits will improve Outcome: Progressing Goal: Will remain free from infection Outcome: Progressing Goal: Diagnostic test results will improve Outcome: Progressing Goal: Respiratory complications will improve Outcome: Progressing Goal: Cardiovascular complication will be avoided Outcome: Progressing   Problem: Activity: Goal: Risk for activity intolerance will decrease Outcome: Not Progressing   Problem: Nutrition: Goal: Adequate nutrition will be maintained Outcome: Not Progressing   Problem: Elimination: Goal: Will not experience complications related to urinary retention Outcome: Progressing   Problem: Pain Managment: Goal: General experience of comfort will improve and/or be controlled Outcome: Progressing   Problem: Skin Integrity: Goal: Risk for impaired skin integrity will decrease Outcome: Progressing

## 2023-08-08 NOTE — Consult Note (Signed)
 WOC team consulted for low air loss mattress.  Patient is known to [facility] team from previous admission and is bedbound.  Order placed by [facility] RN for low air loss mattress, bedside staff will need to call portable equipment and request low air loss mattress be delivered to unit.   WOC team will not follow. Re-consult if further needs arise.   Thank you,    Ronni Colace MSN, RN-BC, [employer] [phone]

## 2023-08-08 NOTE — Anesthesia Preprocedure Evaluation (Addendum)
 Anesthesia Evaluation  Patient identified by MRN, date of birth, ID band Patient awake    Reviewed: Allergy & Precautions, NPO status , Patient's Chart, lab work & pertinent test results  Airway Mallampati: IV

## 2023-08-08 NOTE — Progress Notes (Signed)
 Daily Progress Note Intern Pager: [phone]  Patient name: [name] Medical record number: [id number] Date of birth: April 12, 1946 Age: 78 y.o. Gender: female  Primary Care Provider: Samie Frederick, PA-C Consultants: PCCM Code Status: Full  Pt Overview and Major Events to Date:  04/16 - Re-admitted 04/18 - Bronchoscopy  Assessment and Plan:  [name] 78 y.o. F admitted for metabolic/infectious encephalopathy secondary to likely aspiration. PMH includes MS, spastic quadriplegia, complete heart block s/p pacemaker.  Assessment & Plan Altered mental status Mental status this am, mildly improved.  Workup thus far negative, RPR/B1 pending. Bcx likely contaminant. - PCCM consulted - appreciate recs - Continue Zosyn  per pharmacy, 5 day course - Follow up blood cultures  - Continuous cardiac monitoring  - q4 neuro checks - O2 supplementation as needed  - Delirium precautions  - NPO while altered - MBS tomorrow per SLP  - AM BMP Aspiration pneumonia of left lung (HCC)  Bronchial obstruction Stable respiratory status.  Suspect improvement after bronchoscopy. -Pulmonology consulted recs appreciated -Supplemental O2 as needed -Duoneb and Hypertonic saline -Expected Bronchoscopy today -Continue Zosyn  -MBS per speech UTI (urinary tract infection) Chronic indwelling suprapubic catheter. Now s/p full antibiotic course. Alkalosis Contraction alkalosis on admission, s/p fluid resuscitation. BMP stable. - Continue D5LR Chronic health problem (Holding PO meds pending SLP eval/MBS) Spastic quadriplegia: Home baclofen  20 TID. Holding home tramadol , trazodone , gabapentin  due to AMS Neurogenic bladder: Suprapubic catheter. Hold home Vesicare and Mirabegron , cannot be crushed, aspiration risk Hypothyroidism: Home synthroid  25 daily.  SVT s/p ablation, complete heart block s/p pacemaker: CCM MS: Not on any DMTs Constipation: Home miralax  daily  Mood: Home cymbalta  60 daily   Allergies: Home singulair  10 daily, xyzal 5 daily, flonase  daily prn  Sacral pressure injury - Wound care consulted   FEN/GI: NPO PPx: Lovenox  Dispo: Pending clinical improvement  Subjective:  No acute events overnight.  Patient feels her breathing is fine.  Feels she is improved since previously.  Is nervous about her bronchoscopy today.  Objective: Temp:  [97.6 F (36.4 C)-98.8 F (37.1 C)] 97.9 F (36.6 C) (04/18 0756) Pulse Rate:  [53-68] 68 (04/18 0756) Resp:  [14-26] 18 (04/18 0756) BP: (122-192)/(70-103) 154/100 (04/18 0756) SpO2:  [92 %-99 %] 98 % (04/18 0756) Physical Exam: General: NAD  Neuro: A&O x 2, demonstrates some understanding of medical situation Cardiovascular: RRR, no murmurs, no peripheral edema Respiratory: normal WOB on 3L, clear to auscultation on the right side, no breath sounds on left Extremities: All 4 extremities contracted, nonmobile   Laboratory: Most recent CBC Lab Results  Component Value Date   WBC 6.9 08/07/2023   HGB 14.8 08/07/2023   HCT 45.1 08/07/2023   MCV 92.8 08/07/2023   PLT 224 08/07/2023   Most recent BMP    Latest Ref Rng & Units 08/08/2023    5:38 AM  BMP  Glucose 70 - 99 mg/dL 876   BUN 8 - 23 mg/dL 11   Creatinine 9.55 - 1.00 mg/dL 9.52   Sodium 864 - 854 mmol/L 136   Potassium 3.5 - 5.1 mmol/L 3.6   Chloride 98 - 111 mmol/L 99   CO2 22 - 32 mmol/L 24   Calcium 8.9 - 10.3 mg/dL 8.6    Procal <9.89 Urine culture growing yeast, likely colonized Bcx growing staph epi, likely contaminant  Imaging/Diagnostic Tests:  CT Chest 08/07/23 1. Retained or Aspirated secretions in the trachea with opacified left mainstem bronchus and subtotal left lung collapse/consolidation. Consider mucous  plug versus aspiration pneumonia. Possible small layering right pleural effusion.   2. Right lung better aerated with mild dependent atelectasis, but patchy peripheral peribronchial opacity in the right middle and lower lobes  could be developing infection.   3. Cardiac pacemaker. Sequelae of gastric bypass, cholecystectomy, cervicothoracic decompression and fusion. Large bowel diverticulosis.  Erin Sharper, MD 08/08/2023, 8:11 AM  PGY-2, [facility] FPTS Intern pager: [phone], text pages welcome Secure chat group [facility] Teaching Service

## 2023-08-08 NOTE — Assessment & Plan Note (Addendum)
(  Holding PO meds pending SLP eval/MBS) Spastic quadriplegia: Home baclofen  20 TID. Holding home tramadol , trazodone , gabapentin  due to AMS Neurogenic bladder: Suprapubic catheter. Hold home Vesicare and Mirabegron , cannot be crushed, aspiration risk Hypothyroidism: Home synthroid  25 daily.  SVT s/p ablation, complete heart block s/p pacemaker: CCM MS: Not on any DMTs Constipation: Home miralax  daily  Mood: Home cymbalta  60 daily  Allergies: Home singulair  10 daily, xyzal 5 daily, flonase  daily prn  Sacral pressure injury - Wound care consulted

## 2023-08-08 NOTE — Progress Notes (Signed)
 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 She is not back to baseline according to her daughter and has low oral intake and lethargy. Last night she aspirated on her meds. Denied f/c/r, URTI symptoms, wheezing, CP, abd pain, N/V/D, rash, and LL edema. She has dry cough and mild SOB. Urine has sediment and changing the suprapubic cath q 3 weeks (due next week). Denied smoking, alcohol drinking, and illicit drug use. She is bedbound. Discharged on home O2 @ 3.5 L 3 days ago but she is not using it.   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 Retained or Aspirated secretions in the trachea with opacified left mainstem bronchus and subtotal left lung collapse/consolidation. Consider mucous plug versus aspiration pneumonia. Possible small  layering right pleural effusion. Right lung better aerated with mild dependent atelectasis, but patchy peripheral peribronchial opacity in the right middle and lower lobes could be developing infection. PCCM was consulted 4/17: failed ST eval and MBS is ordered for tomorrow  Interim History / Subjective:   No acute events overnight Ready to get bronchoscopy done  Objective   Blood pressure (!) 177/97, pulse 94, temperature 98 F (36.7 C), temperature source Temporal, resp. rate (!) 21, height 5\' 4"  (1.626 m), weight 68 kg, SpO2 95%.        Intake/Output Summary (Last 24 hours) at 08/08/2023 1136 Last data filed at 08/08/2023 0400 Gross per 24 hour  Intake --  Output 750 ml  Net -750 ml   Filed Weights   08/06/23 1213 08/08/23 1123  Weight: 68 kg 68 kg    Examination: General: elderly woman, no acute distress, lying in bed HENT: PERRL, moist mucous membranes Lungs: decreased air entry on the left side and basal crackles. No wheezing.  Cardiovascular: rrr, no murmurs  Abdomen: no distension or tenderness. Suprapubic cath Extremities: trace feet edema. Symmetrical  Neuro: symmetrical facial appearance. Spastic upper limbs and quadriplegia    Resolved Hospital Problem list     Assessment & Plan:  Chronic hypoxic resp failure (home O2 @ 3.5 L [place]) Left sided subtotal left lung collapse/consolidation, mucous plugs, and possible aspiration PNA -O2 for SpO2 >92% -Flex bronchoscopy/BAL/therapeutic suctioing, consent was obtained from daughter  -Pulm hygiene -Duoneb and HTS neb -Zosyn   -f/u Bcx2 and UCx -PCT -MRSA screening -HOB elevation -Aspiration precautions   -MBS per speech  Possible complicated UTI, s/p suprapubic catheter for neurogenic bladder -f/u Cx -Zosyn  -IVF  Oral thrush -Nystatin    Metabolic encephalopathy  due to infection and poor hydration/intake  High aspiration risk with poor oral intake and moderate PCM -Dietitian consult and consider PEG (d/w  daughter)  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, Echo: EF 65%, G1DD), SVT ablation, CHB s/p PPM Hypothyroidism Esophageal dysmotility/gastric bypass surgery -H2B  Multiple sclerosis Constipation -Laxatives Post-traumatic quadriplegia   FULL CODE  Best Practice (right click and "Reselect all SmartList Selections" daily)   Diet/type: NPO DVT prophylaxis LMWH Pressure ulcer(s): N/A GI prophylaxis: H2B Lines: N/A Foley:  Yes, and it is still needed Code Status:  full code Last date of multidisciplinary goals of care discussion []   Labs   CBC: Recent Labs  Lab 08/01/23 1518 08/02/23 0919 08/03/23 1033 08/04/23 0650 08/06/23 1150 08/06/23 1211 08/07/23 0500  WBC 11.8* 8.1 7.3 6.5 6.1  --  6.9  NEUTROABS 9.3*  --   --   --  3.4  --   --   HGB 14.9 14.0 14.3 14.2 14.4 14.6 14.8  HCT 45.9 43.1 43.4 44.8 45.2 43.0 45.1  MCV 95.0 93.5 92.3 94.3 94.8  --  92.8  PLT 242 220 212 199 217  --  224    Basic Metabolic Panel: Recent Labs  Lab 08/03/23 1033 08/04/23 0650 08/06/23 1150 08/06/23 1211 08/07/23 0500 08/08/23 0538  NA 139 141 141 139 136 136  K 3.6 4.0 4.5 4.8 4.0 3.6  CL 104 109 105  --  100 99  CO2 24 22 28   --  24 24  GLUCOSE 132* 125* 101*  --  80 123*  BUN 20 19 13   --  11 11  CREATININE 0.36* 0.35* 0.33*  --  0.39* 0.47  CALCIUM 8.7* 8.5* 8.9  --  8.5* 8.6*   GFR: Estimated Creatinine Clearance: 55.8 mL/min (by C-G formula based on SCr of 0.47 mg/dL). Recent Labs  Lab 08/01/23 1539 08/02/23 0919 08/03/23 1033 08/04/23 0650 08/06/23 1150 08/06/23 1212 08/07/23 0500 08/07/23 1730  PROCALCITON  --   --   --   --   --   --   --  <0.10  WBC  --    < > 7.3 6.5 6.1  --  6.9  --   LATICACIDVEN 1.3  --   --   --   --  1.1  --   --    < > = values in this interval not displayed.    Liver Function Tests: Recent Labs  Lab 08/01/23 1518 08/02/23 0919 08/03/23 1033 08/06/23 1150  AST 35 20 17 24   ALT 23 22 18 14   ALKPHOS 50 44 41 42  BILITOT 1.4* 1.0 0.8 0.5  PROT  6.2* 5.9* 6.2* 5.9*  ALBUMIN 3.2* 3.0* 2.8* 2.8*   Recent Labs  Lab 08/01/23 1518  LIPASE 22   Recent Labs  Lab 08/01/23 1934  AMMONIA 20    ABG    Component Value Date/Time   PHART 7.273 (L) 01/18/2023 1310   PCO2ART 59.7 (H) 01/18/2023 1310   PO2ART 140 (H) 01/18/2023 1310   HCO3 32.6 (H) 08/06/2023 1211   TCO2 34 (H) 08/06/2023 1211   O2SAT 99 08/06/2023 1211     Coagulation Profile: No results for input(s): "INR", "PROTIME" in the last 168 hours.  Cardiac Enzymes: No results for input(s): "CKTOTAL", "CKMB", "CKMBINDEX", "TROPONINI" in the last 168 hours.  HbA1C: No results found for: "HGBA1C"  CBG: Recent Labs  Lab 08/07/23 2025 08/08/23 0041 08/08/23 0423  GLUCAP 105* 115* 120*    Duaine German, MD [facility] Office: [phone]   See Amion for personal pager PCCM on call pager [phone] until 7pm. Please call Elink 7p-7a. [phone]

## 2023-08-08 NOTE — Progress Notes (Signed)
 Returned from [employer] procedure via bed

## 2023-08-08 NOTE — Assessment & Plan Note (Signed)
 Contraction alkalosis on admission, s/p fluid resuscitation. BMP stable. - Continue D5LR

## 2023-08-08 NOTE — Assessment & Plan Note (Addendum)
 Stable respiratory status.  Suspect improvement after bronchoscopy. -Pulmonology consulted recs appreciated -Supplemental O2 as needed -Duoneb and Hypertonic saline -Expected Bronchoscopy today -Continue Zosyn  -MBS per speech

## 2023-08-08 NOTE — Anesthesia Postprocedure Evaluation (Signed)
 Anesthesia Post Note  Patient: [name]  Procedure(s) Performed: BRONCHOSCOPY, FLEXIBLE (Bilateral) IRRIGATION, BRONCHUS     Patient location during evaluation: PACU Anesthesia Type: General Level of consciousness: awake and alert Pain management: pain level controlled Vital Signs Assessment: post-procedure vital signs reviewed and stable Respiratory status: spontaneous breathing, nonlabored ventilation, respiratory function stable and patient connected to nasal cannula oxygen Cardiovascular status: blood pressure returned to baseline and stable Postop Assessment: no apparent nausea or vomiting Anesthetic complications: no   No notable events documented.  Last Vitals:  Vitals:   08/08/23 1310 08/08/23 1320  BP: (!) 145/106 (!) 161/92  Pulse: 72 83  Resp: 14 17  Temp:    SpO2: 91% 94%    Last Pain:  Vitals:   08/08/23 1320  TempSrc:   PainSc: 0-No pain                 Theotis Flake P Shadi Sessler

## 2023-08-08 NOTE — Plan of Care (Signed)
 Had bronchoscopy today. NPO since failed swallow test. Will have Barium swallow 08/09/23. Air bed in use. BP( 171/97 ) increased and no new orders . Echo completed.    Problem: Clinical Measurements: Goal: Respiratory complications will improve Outcome: Progressing   Problem: Coping: Goal: Level of anxiety will decrease Outcome: Progressing   Problem: Safety: Goal: Ability to remain free from injury will improve Outcome: Progressing   Problem: Skin Integrity: Goal: Risk for impaired skin integrity will decrease Outcome: Progressing

## 2023-08-08 NOTE — Care Plan (Signed)
 FMTS Brief Progress Note  S: Patient seen at bedside following report from RN stating that patient was in pain and needing IV pain medicine.  Patient reports she is "better."  And when I ask if she is in pain she says "not anymore."  She denies any other needs at this time.   O: BP (!) 143/100 (BP Location: Left Arm)   Pulse 93   Temp 97.6 F (36.4 C) (Oral)   Resp 18   Ht 5\' 4"  (1.626 m)   Wt 68 kg   SpO2 97%   BMI 25.73 kg/m   General: Lying in bed, somewhat tired appearing, NAD. Respiratory: Normal work of breathing on room air, though nasal cannula is in place. Neuro: Oriented to self, place.  Not oriented to time.  Overall slowed speech, however answers questions appropriately.  A/P: At this time patient denies pain, and objectively does not appear to be in any discomfort.  She has been altered during her hospital stay, and it would not surprise me if she does intermittently report pain and then deny it.  Will continue to monitor.  Appreciate RN reaching out to update team. - Will avoid additional IV pain medication at this time due to concern for precipitating worsening mental status.  - Orders reviewed.  Lab holiday tomorrow. - If condition changes, plan includes reevaluation.   Omar Bibber, DO 08/08/2023, 8:38 PM PGY-1, [facility] Night Resident  Please page [phone] with questions.

## 2023-08-08 NOTE — Progress Notes (Signed)
 Orthopedic Tech Progress Note Patient Details:  [name] [birth year] [id number]        Rn called requesting R WHO. Pt. Had received already from order on 08-04-23, but item was lost in transfer from 2nd floor to 3rd floor. Item provided. R WHO medium. Herbie Loll 08/08/2023, 8:35 PM

## 2023-08-08 NOTE — Progress Notes (Signed)
  Echocardiogram 2D Echocardiogram has been performed.  Dayelin Balducci L Grant Henkes RDCS 08/08/2023, 9:07 AM

## 2023-08-08 NOTE — Assessment & Plan Note (Addendum)
 Chronic indwelling suprapubic catheter. Now s/p full antibiotic course.

## 2023-08-08 NOTE — Progress Notes (Addendum)
 SLP Cancellation Note  Patient Details Name: [name] MRN: [id number] DOB: Mar 20, 1946   Cancelled treatment:       MBSS originally scheduled for this morning; however, pt is NPO awaiting bronchoscopy this afternoon. Anticipate pt will be too lethargic to safely participate in MBSS following bronch. Will re-schedule MBSS for Sat, 4/19. Pt and pt communication board updated in room.    Legend Tumminello J Kyllie Pettijohn 08/08/2023, 7:34 AM

## 2023-08-08 NOTE — Transfer of Care (Signed)
 Immediate Anesthesia Transfer of Care Note  Patient: [name]  Procedure(s) Performed: BRONCHOSCOPY, FLEXIBLE (Bilateral) IRRIGATION, BRONCHUS  Patient Location: PACU  Anesthesia Type:General  Level of Consciousness: awake and sedated  Airway & Oxygen Therapy: Patient Spontanous Breathing and Patient connected to nasal cannula oxygen  Post-op Assessment: Report given to RN and Post -op Vital signs reviewed and stable  Post vital signs: Reviewed and stable  Last Vitals:  Vitals Value Taken Time  BP 149/78 08/08/23 1301  Temp 36.7 C 08/08/23 1301  Pulse 70 08/08/23 1304  Resp 18 08/08/23 1304  SpO2 89 % 08/08/23 1304  Vitals shown include unfiled device data.  Last Pain:  Vitals:   08/08/23 1301  TempSrc: Temporal  PainSc: 0-No pain         Complications: No notable events documented.

## 2023-08-09 ENCOUNTER — Inpatient Hospital Stay (HOSPITAL_COMMUNITY)

## 2023-08-09 DIAGNOSIS — J9611 Chronic respiratory failure with hypoxia: Secondary | ICD-10-CM | POA: Diagnosis not present

## 2023-08-09 DIAGNOSIS — J690 Pneumonitis due to inhalation of food and vomit: Secondary | ICD-10-CM | POA: Diagnosis not present

## 2023-08-09 DIAGNOSIS — R1313 Dysphagia, pharyngeal phase: Secondary | ICD-10-CM | POA: Diagnosis not present

## 2023-08-09 LAB — GLUCOSE, CAPILLARY
Glucose-Capillary: 188 mg/dL — ABNORMAL HIGH (ref 70–99)
Glucose-Capillary: 219 mg/dL — ABNORMAL HIGH (ref 70–99)

## 2023-08-09 MED ORDER — ACETAMINOPHEN 325 MG PO TABS
650.0000 mg | ORAL_TABLET | Freq: Four times a day (QID) | ORAL | Status: DC | PRN
  Administered 2023-08-09: 650 mg via ORAL
  Filled 2023-08-09: qty 2

## 2023-08-09 MED ORDER — DOCUSATE SODIUM 50 MG/5ML PO LIQD
100.0000 mg | Freq: Two times a day (BID) | ORAL | Status: DC
  Administered 2023-08-10 – 2023-08-12 (×4): 100 mg via ORAL
  Filled 2023-08-09 (×8): qty 10

## 2023-08-09 MED ORDER — DOCUSATE SODIUM 100 MG PO CAPS
100.0000 mg | ORAL_CAPSULE | Freq: Two times a day (BID) | ORAL | Status: DC
Start: 2023-08-09 — End: 2023-08-09

## 2023-08-09 MED ORDER — ENSURE ENLIVE PO LIQD: 237.0000 mL | Freq: Two times a day (BID) | ORAL | Status: DC

## 2023-08-09 MED ORDER — ONDANSETRON 4 MG PO TBDP
4.0000 mg | ORAL_TABLET | Freq: Three times a day (TID) | ORAL | Status: DC | PRN
  Administered 2023-08-09: 4 mg via ORAL
  Filled 2023-08-09: qty 1

## 2023-08-09 MED ORDER — ENSURE ENLIVE PO LIQD
237.0000 mL | Freq: Two times a day (BID) | ORAL | Status: DC
  Administered 2023-08-09 – 2023-08-12 (×4): 237 mL via ORAL

## 2023-08-09 NOTE — Assessment & Plan Note (Addendum)
 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 Holding home tramadol , trazodone , gabapentin  due to AMS Neurogenic bladder: Suprapubic catheter. Hold home Vesicare and Mirabegron , cannot be crushed, aspiration risk Hypothyroidism: Home synthroid  25 daily.  SVT s/p ablation, complete heart block s/p pacemaker: CCM MS: Not on any DMTs Constipation: Home miralax  daily  Mood: Home cymbalta  60 daily  Allergies: Home singulair  10 daily, xyzal 5 daily, flonase  daily prn  Sacral pressure injury - Wound care consulted

## 2023-08-09 NOTE — Assessment & Plan Note (Signed)
 Chronic indwelling suprapubic catheter. Now s/p full antibiotic course.

## 2023-08-09 NOTE — Assessment & Plan Note (Addendum)
 Stable respiratory status on 3.5L O2.  Stable s/p bronchoscopy with PCCM.  - Continue Zosyn  - Dysphagia diet with meds crushed in puree per SLP s/p MBS

## 2023-08-09 NOTE — Progress Notes (Signed)
 Daily Progress Note Intern Pager: [phone]  Patient name: [name] Medical record number: [id number] Date of birth: 17-May-1945 Age: 78 y.o. Gender: female  Primary Care Provider: Gerrianne Krauss, PA-C Consultants: PCCM Code Status: Full  Pt Overview and Major Events to Date:  4/16: Admitted 4/18: Bronchoscopy  Assessment and Plan:  [name] 78 y.o. F admitted for metabolic/infectious encephalopathy secondary to likely aspiration now s/p bronchoscopy. Assessment & Plan Altered mental status Stable mental status this morning, approaching baseline per patient aid. Bcx likely contaminant in one bottle, other bottle with Ng3d.  - PCCM consulted - appreciate recs - Continue Zosyn  per pharmacy, 5 day course - Follow up blood cultures, alveolar lavage sample culture  - q4 neuro checks - O2 supplementation as needed  - Delirium precautions  - AM BMP, Mg, Phos Aspiration pneumonia of left lung (HCC)  Bronchial obstruction Stable respiratory status on 3.5L O2.  Stable s/p bronchoscopy with PCCM.  - Continue Zosyn  - Dysphagia diet with meds crushed in puree per SLP s/p MBS UTI (urinary tract infection) Chronic indwelling suprapubic catheter. Now s/p full antibiotic course. Alkalosis Contraction alkalosis on admission, s/p fluid resuscitation. BMP stable. - Continue D5LR - consider dc if POing well  - Dysphagia diet per SLP Chronic health problem Spastic quadriplegia: Home baclofen  20 TID. Holding home tramadol , trazodone , gabapentin  due to AMS Neurogenic bladder: Suprapubic catheter. Hold home Vesicare and Mirabegron , cannot be crushed, aspiration risk Hypothyroidism: Home synthroid  25 daily.  SVT s/p ablation, complete heart block s/p pacemaker: CCM MS: Not on any DMTs Constipation: Home miralax  daily  Mood: Home cymbalta  60 daily  Allergies: Home singulair  10 daily, xyzal 5 daily, flonase  daily prn  Sacral pressure injury - Wound care consulted  FEN/GI:  Dysphagia diet with meds crushed in puree per SLP PPx: Lovenox  Dispo: Home pending clinica improvement   Subjective:  Per patient's aid, patient approaching baseline, not quite there yet.   Objective: Temp:  [97.5 F (36.4 C)-98.4 F (36.9 C)] 97.5 F (36.4 C) (04/19 0441) Pulse Rate:  [68-94] 92 (04/19 0441) Resp:  [14-21] 18 (04/19 0441) BP: (129-177)/(78-106) 153/95 (04/19 0441) SpO2:  [91 %-98 %] 93 % (04/19 0441) Weight:  [68 kg] 68 kg (04/18 1123) Physical Exam: General: No acute distress. Resting comfortably in room. CV: Normal S1/S2. No extra heart sounds. Warm and well-perfused. Pulm: Breathing comfortably on 3.5L O2. CTAB anteriorly. No increased WOB. Abd: Soft, non-tender, non-distended. Skin/Ext:  Warm, dry. Contracted upper extremities.  Neuro: Alert and oriented to self, birthday, place. Not oriented to year. PERRLA.   Laboratory: Most recent CBC Lab Results  Component Value Date   WBC 6.9 08/07/2023   HGB 14.8 08/07/2023   HCT 45.1 08/07/2023   MCV 92.8 08/07/2023   PLT 224 08/07/2023   Most recent BMP    Latest Ref Rng & Units 08/08/2023    5:38 AM  BMP  Glucose 70 - 99 mg/dL 147   BUN 8 - 23 mg/dL 11   Creatinine 8.29 - 1.00 mg/dL 5.62   Sodium 130 - 865 mmol/L 136   Potassium 3.5 - 5.1 mmol/L 3.6   Chloride 98 - 111 mmol/L 99   CO2 22 - 32 mmol/L 24   Calcium 8.9 - 10.3 mg/dL 8.6    MBS 7/84: decreased anterior hyoid excursion and decreased laryngeal elevation  Erin Chapman, MD 08/09/2023, 7:44 AM  PGY-1, [facility] FPTS Intern pager: [phone], text pages welcome Secure chat group [facility] Teaching Service

## 2023-08-09 NOTE — Progress Notes (Signed)
 NAME:  [name], MRN:  [id number], DOB:  1946-03-27, LOS: 3 ADMISSION DATE:  08/06/2023, CONSULTATION DATE:  08/07/2023 REFERRING MD: McDiarmid, MD, CHIEF COMPLAINT: aspiration PNA   History of Present Illness:  A 78 yr old female patient 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, Echo: EF 65%, G1DD), SVT ablation, complete heart block s/p pacemaker, hypothyroidism, h/o gastric bypass, esophageal dysmotility, suprapubic catheter for neurogenic bladder and multiple UTIs (last one was one week ago), multiple sclerosis, constipation, moderate PCM, and post-traumatic quadriplegia who presented to ED yesterday with AMS for one week since last admission for UTI. She is not back to baseline according to her daughter and has low oral intake and lethargy. Last night she aspirated on her meds. Denied f/c/r, URTI symptoms, wheezing, CP, abd pain, N/V/D, rash, and LL edema. She has dry cough and mild SOB. Urine has sediment and changing the suprapubic cath q 3 weeks (due next week). Denied smoking, alcohol drinking, and illicit drug use. She is bedbound. Discharged on home O2 @ 3.5 L 3 days ago but she is not using it.   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 Retained or Aspirated secretions in the trachea with opacified left mainstem bronchus and subtotal left lung collapse/consolidation. Consider mucous plug versus aspiration pneumonia. Possible small  layering right pleural effusion. Right lung better aerated with mild dependent atelectasis, but patchy peripheral peribronchial opacity in the right middle and lower lobes could be developing infection. PCCM was consulted 4/17: failed ST eval and MBS ordered  Interim History / Subjective:   No acute events overnight Tolerated bronchoscopy well yesterday MBS performed this morning with dysphagia 1 diet recommended and thin liquids Patient is feeling better today  Objective   Blood pressure 136/82, pulse 66, temperature 98.2 F (36.8 C), temperature source Oral, resp. rate 16, height 5\' 4"  (1.626 m), weight 68 kg, SpO2 97%.        Intake/Output Summary (Last 24 hours) at 08/09/2023 1146 Last data filed at 08/09/2023 0524 Gross per 24 hour  Intake 1786.77 ml  Output 400 ml  Net 1386.77 ml   Filed Weights   08/06/23 1213 08/08/23 1123  Weight: 68 kg 68 kg    Examination: General: elderly woman, no acute distress, lying in bed HENT: PERRL, moist mucous membranes Lungs: clear to auscultation bilaterally Cardiovascular: rrr, no murmurs  Abdomen: no distension or tenderness. Suprapubic cath Extremities: trace feet edema. Symmetrical  Neuro: symmetrical facial appearance. Spastic upper limbs and quadriplegia    Bal 4/18 - pending, few yeast noted on gram stain  Resolved Hospital Problem list     Assessment & Plan:  Chronic hypoxic resp failure (home O2 @ 3.5 L [place]) Left sided subtotal left lung collapse/consolidation, mucous plugs, and possible aspiration PNA -O2 for SpO2 >92% -s/p bronchoscopy with BAL 4/18 - follow up BAL cultures -Pulm hygiene -Duoneb and hypertonic saline neb -Zosyn , continue until BAL cultures result -HOB elevation -Aspiration precautions    Possible complicated UTI, s/p suprapubic catheter for neurogenic bladder -Zosyn  -IVF  Oral thrush -Nystatin    Metabolic encephalopathy due to infection and poor hydration/intake  High aspiration risk with  poor oral intake and moderate PCM -MBS study completed with dysphagia I diet recommended  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, Echo: EF 65%, G1DD), SVT ablation, CHB s/p PPM Hypothyroidism Esophageal dysmotility/gastric bypass surgery -H2B  Multiple sclerosis Constipation -Laxatives Post-traumatic quadriplegia   FULL CODE  Best Practice (right click and "Reselect all SmartList Selections" daily)   Diet/type: dysphagia diet (see orders) DVT prophylaxis LMWH Pressure ulcer(s): N/A GI prophylaxis: H2B Lines: N/A Foley:  Yes, and it is still needed Code Status:  full code Last date of multidisciplinary goals of care discussion []   Labs   CBC: Recent Labs  Lab 08/03/23 1033 08/04/23 0650 08/06/23 1150 08/06/23 1211 08/07/23 0500  WBC 7.3 6.5 6.1  --  6.9  NEUTROABS  --   --  3.4  --   --   HGB 14.3 14.2 14.4 14.6 14.8  HCT 43.4 44.8 45.2 43.0 45.1  MCV 92.3 94.3 94.8  --  92.8  PLT 212 199 217  --  224    Basic Metabolic Panel: Recent Labs  Lab 08/03/23 1033 08/04/23 0650 08/06/23 1150 08/06/23 1211 08/07/23 0500 08/08/23 0538  NA 139 141 141 139 136 136  K 3.6 4.0 4.5 4.8 4.0 3.6  CL 104 109 105  --  100 99  CO2 24 22 28   --  24 24  GLUCOSE 132* 125* 101*  --  80 123*  BUN 20 19 13   --  11 11  CREATININE 0.36* 0.35* 0.33*  --  0.39* 0.47  CALCIUM 8.7* 8.5* 8.9  --  8.5* 8.6*   GFR: Estimated Creatinine Clearance: 55.8 mL/min (by C-G formula based on SCr of 0.47 mg/dL). Recent Labs  Lab 08/03/23 1033 08/04/23 0650 08/06/23 1150 08/06/23 1212 08/07/23 0500 08/07/23 1730  PROCALCITON  --   --   --   --   --  <0.10  WBC 7.3 6.5 6.1  --  6.9  --   LATICACIDVEN  --   --   --  1.1  --   --     Liver Function Tests: Recent Labs  Lab 08/03/23 1033 08/06/23 1150  AST 17 24  ALT 18 14  ALKPHOS 41 42  BILITOT 0.8 0.5  PROT 6.2* 5.9*  ALBUMIN 2.8* 2.8*   No results for input(s): "LIPASE", "AMYLASE" in the last 168 hours.  No results for input(s): "AMMONIA" in the  last 168 hours.   ABG    Component Value Date/Time   PHART 7.273 (L) 01/18/2023 1310   PCO2ART 59.7 (H) 01/18/2023 1310   PO2ART 140 (H) 01/18/2023 1310   HCO3 32.6 (H) 08/06/2023 1211   TCO2 34 (H) 08/06/2023 1211   O2SAT 99 08/06/2023 1211     Coagulation Profile: No results for input(s): "INR", "PROTIME" in the last 168 hours.  Cardiac Enzymes: No results for input(s): "CKTOTAL", "CKMB", "CKMBINDEX", "TROPONINI" in the last 168 hours.  HbA1C: No results found for: "HGBA1C"  CBG: Recent Labs  Lab 08/08/23 0423 08/08/23 2014 08/08/23 2345 08/09/23 0445 08/09/23 0801  GLUCAP 120* 172* 198* 188* 219*    Duaine German, MD [facility] Office: [phone]   See Amion for personal pager PCCM on call pager [phone] until 7pm. Please call Elink 7p-7a. [phone]

## 2023-08-09 NOTE — Procedures (Signed)
 Modified Barium Swallow Study  Patient Details  Name: [name] MRN: [id number] Date of Birth: 12-Nov-1945  Today's Date: 08/09/2023  Modified Barium Swallow completed.  Full report located under Chart Review in the Imaging Section.  History of Present Illness [name] is 78 y.o. female admitted for aspiration pneumonia of left lung. Recent prior admission for altered mental status likely secondary to infectious encephalopathy (urosepsis). Pertinent PMH/PSH includes MS, spastic quadriplegia, CHF s/p pacemaker placement. Pt had a MBS on 01/22/23 that indicated penetration and aspiration with thins (silent) and nectar thick and deep penetration with HTL. SLP recommended NTL to reduce likelihood of repeated aspiration. Per family reports, the pt did not adhere to nectar thick liquid while at home and has been on thin liquid.   Clinical Impression Patient presents with a mildly improved swallow function during today's MBS as compared to most recent MBS on 01/22/2023. During today's study, patient continues with decreased anterior hyoid excursion and decreased laryngeal elevation which, in addition to structural changes related to cervical hardware (anterior and posterior), lead to only partial inversion of epiglottis and incomplete laryngeal vestibule closure. Flash penetration (PAS 2) occured with thin liquids and very shallow flash penetration (PAS 2) occured with nectar thick liquids. With straw sip of thin liquids, patient with deep penetration above vocal cords prior to swallow initiation, which did not fully clear (PAS 3). No aspiration observed with any of the tested consistencies (thin, nectar thick, puree). Patient independently performs multiple dry swallows (at least three) with swallows of liquids and solids and these swallows were effective to clear majority of pharyngeal residuals.  With puree solids, patient with mild-moderate vallecular and posterior pharyngeal residuals which  cleared with subsequent dry swallows and sips of thin liquids. SLP is recommending to initate PO diet of Dys 1 (puree), thin liquids via spoon or small cup sip. SLP will follow for toleration and education. Factors that may increase risk of adverse event in presence of aspiration Roderick Civatte & Jessy Morocco 2021): Frail or deconditioned;Weak cough;Limited mobility  Swallow Evaluation Recommendations Recommendations: PO diet PO Diet Recommendation: Dysphagia 1 (Pureed);Thin liquids (Level 0) Liquid Administration via: Cup;Spoon;No straw Medication Administration: Whole meds with puree Supervision: Full supervision/cueing for swallowing strategies;Full assist for feeding Swallowing strategies  : Slow rate;Small bites/sips;Follow solids with liquids Postural changes: Position pt fully upright for meals Oral care recommendations: Oral care BID (2x/day);Staff/trained caregiver to provide oral care    Jacqualine Mater, MA, CCC-SLP Speech Therapy

## 2023-08-09 NOTE — Plan of Care (Signed)
 Barium swallow completed today and diet and by mouth meds restarted. Problem: Education: Goal: Knowledge of General Education information will improve Description: Including pain rating scale, medication(s)/side effects and non-pharmacologic comfort measures Outcome: Progressing   Problem: Clinical Measurements: Goal: Will remain free from infection Outcome: Progressing   Problem: Pain Managment: Goal: General experience of comfort will improve and/or be controlled Outcome: Progressing   Problem: Safety: Goal: Ability to remain free from injury will improve Outcome: Progressing   Problem: Skin Integrity: Goal: Risk for impaired skin integrity will decrease Outcome: Progressing

## 2023-08-09 NOTE — Assessment & Plan Note (Addendum)
 Contraction alkalosis on admission, s/p fluid resuscitation. BMP stable. - Continue D5LR - consider dc if POing well  - Dysphagia diet per SLP

## 2023-08-09 NOTE — Progress Notes (Signed)
 Speech Language Pathology Treatment: Dysphagia  Patient Details Name: [name] MRN: [id number] DOB: February 21, 1946 Today's Date: 08/09/2023 Time: 4098-1191 SLP Time Calculation (min) (ACUTE ONLY): 12 min  Assessment / Plan / Recommendation Clinical Impression  Patient seen by SLP for skilled treatment focused on dysphagia goals and education of patient, her son and her hired caregiver. Patient herself was awake but asking when she can go home. SLP provided education about MBS results that were completed earlier this morning, how it compared to October 2024 MBS and recommendations. SLP explained that patient exhibited less pharyngeal residuals and no instances of aspiration during today's study, which is improved since prior study. SLP then discussed recommendations including, spoon or small cup sips of thin liquids, no straws, puree solids. SLP also discussed importance of positioning when eating/drinking. Son and caregiver both reported that her PO intake overall is minimal at home. SLP will continue to follow for toleration and ability to advance with solid textures.    HPI HPI: [name] is 78 y.o. female admitted for aspiration pneumonia of left lung. Recent prior admission for altered mental status likely secondary to infectious encephalopathy (urosepsis). Pertinent PMH/PSH includes MS, spastic quadriplegia, CHF s/p pacemaker placement. Pt had a MBS on 01/22/23 that indicated penetration and aspiration with thins (silent) and nectar thick and deep penetration with HTL. SLP recommended NTL to reduce likelihood of repeated aspiration. Per family reports, the pt did not adhere to nectar thick liquid while at home and has been on thin liquid.      SLP Plan  Continue with current plan of care      Recommendations for follow up therapy are one component of a multi-disciplinary discharge planning process, led by the attending physician.  Recommendations may be updated based on patient  status, additional functional criteria and insurance authorization.    Recommendations  Diet recommendations: Dysphagia 1 (puree);Thin liquid Liquids provided via: Cup;Teaspoon;No straw Medication Administration: Crushed with puree Supervision: Full supervision/cueing for compensatory strategies;Staff to assist with self feeding Compensations: Slow rate;Small sips/bites;Follow solids with liquid Postural Changes and/or Swallow Maneuvers: Seated upright 90 degrees                  Staff/trained caregiver to provide oral care;Oral care BID   Frequent or constant Supervision/Assistance Dysphagia, oropharyngeal phase (R13.12)     Continue with current plan of care    Jacqualine Mater, MA, CCC-SLP Speech Therapy

## 2023-08-09 NOTE — Assessment & Plan Note (Addendum)
 Stable mental status this morning, approaching baseline per patient aid. Bcx likely contaminant in one bottle, other bottle with Ng3d.  - PCCM consulted - appreciate recs - Continue Zosyn  per pharmacy, 5 day course - Follow up blood cultures, alveolar lavage sample culture  - q4 neuro checks - O2 supplementation as needed  - Delirium precautions  - AM BMP, Mg, Phos

## 2023-08-10 ENCOUNTER — Encounter (HOSPITAL_COMMUNITY): Payer: Self-pay | Admitting: Family Medicine

## 2023-08-10 DIAGNOSIS — J690 Pneumonitis due to inhalation of food and vomit: Secondary | ICD-10-CM | POA: Diagnosis not present

## 2023-08-10 DIAGNOSIS — J9611 Chronic respiratory failure with hypoxia: Secondary | ICD-10-CM | POA: Diagnosis not present

## 2023-08-10 DIAGNOSIS — N390 Urinary tract infection, site not specified: Secondary | ICD-10-CM | POA: Diagnosis not present

## 2023-08-10 DIAGNOSIS — R1313 Dysphagia, pharyngeal phase: Secondary | ICD-10-CM | POA: Diagnosis not present

## 2023-08-10 DIAGNOSIS — J9809 Other diseases of bronchus, not elsewhere classified: Secondary | ICD-10-CM | POA: Diagnosis not present

## 2023-08-10 LAB — BASIC METABOLIC PANEL WITH GFR
Anion gap: 14 (ref 5–15)
BUN: 10 mg/dL (ref 8–23)
CO2: 24 mmol/L (ref 22–32)
Calcium: 8.5 mg/dL — ABNORMAL LOW (ref 8.9–10.3)
Chloride: 99 mmol/L (ref 98–111)
Creatinine, Ser: 0.35 mg/dL — ABNORMAL LOW (ref 0.44–1.00)
GFR, Estimated: >60 mL/min (ref >60)
Glucose, Bld: 91 mg/dL (ref 70–99)
Potassium: 3 mmol/L — ABNORMAL LOW (ref 3.5–5.1)
Sodium: 137 mmol/L (ref 135–145)

## 2023-08-10 LAB — VITAMIN B1: Vitamin B1 (Thiamine): 184.8 nmol/L (ref 66.5–200.0)

## 2023-08-10 LAB — ACID FAST SMEAR (AFB, MYCOBACTERIA): Acid Fast Smear: NEGATIVE

## 2023-08-10 LAB — MAGNESIUM: Magnesium: 1.9 mg/dL (ref 1.7–2.4)

## 2023-08-10 LAB — PHOSPHORUS: Phosphorus: 1.9 mg/dL — ABNORMAL LOW (ref 2.5–4.6)

## 2023-08-10 MED ORDER — POTASSIUM CHLORIDE 20 MEQ PO PACK: 40.0000 meq | PACK | Freq: Once | ORAL | Status: DC

## 2023-08-10 MED ORDER — TRAZODONE HCL 50 MG PO TABS
50.0000 mg | ORAL_TABLET | Freq: Every day | ORAL | Status: AC
  Administered 2023-08-10: 50 mg via ORAL
  Filled 2023-08-10: qty 1

## 2023-08-10 MED ORDER — POTASSIUM PHOSPHATES 15 MMOLE/5ML IV SOLN
30.0000 mmol | Freq: Once | INTRAVENOUS | Status: AC
  Administered 2023-08-10: 30 mmol via INTRAVENOUS
  Filled 2023-08-10: qty 10

## 2023-08-10 MED ORDER — POTASSIUM CHLORIDE 20 MEQ PO PACK
20.0000 meq | PACK | Freq: Once | ORAL | Status: AC
  Administered 2023-08-10: 20 meq via ORAL
  Filled 2023-08-10: qty 1

## 2023-08-10 NOTE — Plan of Care (Signed)

## 2023-08-10 NOTE — Assessment & Plan Note (Signed)
 Chronic indwelling suprapubic catheter. Now s/p full antibiotic course.

## 2023-08-10 NOTE — Assessment & Plan Note (Signed)
 Stable mental status this morning, alert and oriented to person and place, however question understanding of medical situation.  Suspect this is likely patient's unfortunately new baseline. - Delirium precautions

## 2023-08-10 NOTE — Plan of Care (Signed)
 Potassium supplement given per order today. Assisted with trying to eat meals. Drank juice x 2. Large BM today. Afebrile   Problem: Clinical Measurements: Goal: Will remain free from infection Outcome: Progressing   Problem: Clinical Measurements: Goal: Respiratory complications will improve Outcome: Progressing   Problem: Coping: Goal: Level of anxiety will decrease Outcome: Progressing   Problem: Safety: Goal: Ability to remain free from injury will improve Outcome: Progressing   Problem: Skin Integrity: Goal: Risk for impaired skin integrity will decrease Outcome: Progressing

## 2023-08-10 NOTE — Assessment & Plan Note (Signed)
 At risk for refeeding syndrome. -Daily BMP, Mag, Phos -Replete electrolytes as indicated

## 2023-08-10 NOTE — Progress Notes (Signed)
 Daily Progress Note Intern Pager: [phone]  Patient name: Gara Kincade Medical record number: [id number] Date of birth: 08-07-1945 Age: 78 y.o. Gender: female  Primary Care Provider: Gerrianne Krauss, PA-C Consultants: PCCM Code Status: Full  Pt Overview and Major Events to Date:  4/16: Admitted 4/18: Bronchoscopy  Assessment and Plan:  Lenell Mcconnell is 78 y.o. admitted for metablic/infectious encephalopathy secondary to aspiration pneumonia. PMH includes MS, spastic quadriplegia, complete heart block s/p pacemaker.  Assessment & Plan Aspiration pneumonia of left lung (HCC)  Bronchial obstruction Stable respiratory status on 3.5L O2.  Stable s/p bronchoscopy with PCCM. Awaiting BAL culture results. - PCCM consulted - appreciate recs - Continue Zosyn  per pharmacy, 5 day course - Follow up blood cultures, alveolar lavage sample, narrow antibiotics as indicated - Home oxygen 3.5 L - Dysphagia diet with meds crushed in puree per SLP s/p MBS Protein malnutrition (HCC) At risk for refeeding syndrome. -Daily BMP, Mag, Phos -Replete electrolytes as indicated Altered mental status Stable mental status this morning, alert and oriented to person and place, however question understanding of medical situation.  Suspect this is likely patient's unfortunately new baseline. 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 Holding home tramadol , trazodone , gabapentin  due to AMS Neurogenic bladder: Suprapubic catheter. Hold home Vesicare and Mirabegron , cannot be crushed, aspiration risk Hypothyroidism: Home synthroid  25 daily.  SVT s/p ablation, complete heart block s/p pacemaker: CCM MS: Not on any DMTs Constipation: Home miralax  daily, docusate Mood: Home cymbalta  60 daily  Allergies: Home singulair  10 daily, xyzal (zyrtec ) 5 daily, flonase  daily prn  Sacral pressure injury - Wound care consulted UTI (urinary tract infection) (Resolved:  08/10/2023) Chronic indwelling suprapubic catheter. Now s/p full antibiotic course.   FEN/GI: DYS 1 PPx: Lovenox  Dispo: Pending clinical improvement, likely home with daughter  Subjective:  No acute events overnight, patient feels she is doing better this than when she came in.  States her daughter will be here later today.  Objective: Temp:  [98.2 F (36.8 C)-98.8 F (37.1 C)] 98.4 F (36.9 C) (04/20 0753) Pulse Rate:  [56-78] 77 (04/20 0753) Resp:  [16-17] 17 (04/20 0753) BP: (132-151)/(75-85) 151/84 (04/20 0753) SpO2:  [92 %-98 %] 97 % (04/20 0753) Physical Exam: General: NAD, thin, frail Neuro: A&O Cardiovascular: RRR, no murmurs, no peripheral edema Respiratory: normal WOB on 3.5L [place], CTAB, no wheezes, ronchi or rales Abdomen: soft, NTTP, no rebound or guarding Extremities: UE contracted, LE not moving   Laboratory: Most recent CBC Lab Results  Component Value Date   WBC 6.9 08/07/2023   HGB 14.8 08/07/2023   HCT 45.1 08/07/2023   MCV 92.8 08/07/2023   PLT 224 08/07/2023   Most recent BMP    Latest Ref Rng & Units 08/08/2023    5:38 AM  BMP  Glucose 70 - 99 mg/dL 130   BUN 8 - 23 mg/dL 11   Creatinine 8.65 - 1.00 mg/dL 7.84   Sodium 696 - 295 mmol/L 136   Potassium 3.5 - 5.1 mmol/L 3.6   Chloride 98 - 111 mmol/L 99   CO2 22 - 32 mmol/L 24   Calcium 8.9 - 10.3 mg/dL 8.6     Phos 1.9 Mag 1.9  Imaging/Diagnostic Tests: CXR - Findings compatible with multifocal pneumonia or aspiration are similar to CT 08/07/2023 given differences in technique.  Ivin Marrow, MD 08/10/2023, 8:34 AM  PGY-2, [facility] FPTS Intern pager: [phone], text pages welcome Secure chat group  [facility] Teaching Service

## 2023-08-10 NOTE — Assessment & Plan Note (Signed)
 Stable respiratory status on 3.5L O2.  Stable s/p bronchoscopy with PCCM. Awaiting BAL culture results. - PCCM consulted - appreciate recs - Continue Zosyn  per pharmacy, 5 day course - Follow up blood cultures, alveolar lavage sample, narrow antibiotics as indicated - Home oxygen 3.5 L - Dysphagia diet with meds crushed in puree per SLP s/p MBS

## 2023-08-10 NOTE — Progress Notes (Signed)
 NAME:  [name], MRN:  [id number], DOB:  [birth year], LOS: 4 ADMISSION DATE:  08/06/2023, CONSULTATION DATE:  08/07/2023 REFERRING MD: McDiarmid, MD, CHIEF COMPLAINT: aspiration PNA   History of Present Illness:  A 78 yr old female patient 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, Echo: EF 65%, G1DD), SVT ablation, complete heart block s/p pacemaker, hypothyroidism, h/o gastric bypass, esophageal dysmotility, suprapubic catheter for neurogenic bladder and multiple UTIs (last one was one week ago), multiple sclerosis, constipation, moderate PCM, and post-traumatic quadriplegia who presented to ED yesterday with AMS for one week since last admission for UTI. She is not back to baseline according to her daughter and has low oral intake and lethargy. Last night she aspirated on her meds. Denied f/c/r, URTI symptoms, wheezing, CP, abd pain, N/V/D, rash, and LL edema. She has dry cough and mild SOB. Urine has sediment and changing the suprapubic cath q 3 weeks (due next week). Denied smoking, alcohol drinking, and illicit drug use. She is bedbound. Discharged on home O2 @ 3.5 L 3 days ago but she is not using it.   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 Retained or Aspirated secretions in the trachea with opacified left mainstem bronchus and subtotal left lung collapse/consolidation. Consider mucous plug versus aspiration pneumonia. Possible small  layering right pleural effusion. Right lung better aerated with mild dependent atelectasis, but patchy peripheral peribronchial opacity in the right middle and lower lobes could be developing infection. PCCM was consulted 4/17: failed ST eval and MBS ordered  Interim History / Subjective:   No acute events overnight Patient ready to go home, has been more agitated/moody Not eating well Care taker at bedside, updated daughter on phone  Objective   Blood pressure 108/74, pulse 100, temperature 97.6 F (36.4 C), temperature source Oral, resp. rate 18, height 5\' 4"  (1.626 m), weight 68 kg, SpO2 97%.       No intake or output data in the 24 hours ending 08/10/23 1355  Filed Weights   08/06/23 1213 08/08/23 1123  Weight: 68 kg 68 kg    Examination: General: elderly woman, no acute distress, lying in bed HENT: PERRL, moist mucous membranes Lungs: clear to auscultation bilaterally Cardiovascular: rrr, no murmurs  Abdomen: no distension or tenderness. Suprapubic cath Extremities: trace feet edema. Symmetrical  Neuro: symmetrical facial appearance. Spastic upper limbs and quadriplegia    Bal 4/18 - pending, few yeast noted on gram stain  Resolved Hospital Problem list     Assessment & Plan:  Chronic hypoxic resp failure (home O2 @ 3.5 L [place]) Left sided subtotal left lung collapse/consolidation, mucous plugs, and possible aspiration PNA -O2 for SpO2 >92% -s/p bronchoscopy with BAL 4/18 - follow up BAL cultures, no growth to date -Pulm hygiene -Duoneb and hypertonic saline neb -Zosyn  plan for 7 day course -HOB elevation -Aspiration precautions    Possible complicated UTI, s/p suprapubic catheter for neurogenic bladder -Zosyn  -IVF  Oral thrush -Nystatin    Metabolic encephalopathy due to infection and poor hydration/intake  High aspiration  risk with poor oral intake and moderate PCM -MBS study completed with dysphagia I diet recommended  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, Echo: EF 65%,  G1DD), SVT ablation, CHB s/p PPM Hypothyroidism Esophageal dysmotility/gastric bypass surgery -H2B  Multiple sclerosis Constipation -Laxatives Post-traumatic quadriplegia   FULL CODE  Best Practice (right click and "Reselect all SmartList Selections" daily)   Diet/type: dysphagia diet (see orders) DVT prophylaxis LMWH Pressure ulcer(s): N/A GI prophylaxis: H2B Lines: N/A Foley:  Yes, and it is still needed Code Status:  full code Last date of multidisciplinary goals of care discussion []   Labs   CBC: Recent Labs  Lab 08/04/23 0650 08/06/23 1150 08/06/23 1211 08/07/23 0500  WBC 6.5 6.1  --  6.9  NEUTROABS  --  3.4  --   --   HGB 14.2 14.4 14.6 14.8  HCT 44.8 45.2 43.0 45.1  MCV 94.3 94.8  --  92.8  PLT 199 217  --  224    Basic Metabolic Panel: Recent Labs  Lab 08/04/23 0650 08/06/23 1150 08/06/23 1211 08/07/23 0500 08/08/23 0538 08/10/23 0736  NA 141 141 139 136 136 137  K 4.0 4.5 4.8 4.0 3.6 3.0*  CL 109 105  --  100 99 99  CO2 22 28  --  24 24 24   GLUCOSE 125* 101*  --  80 123* 91  BUN 19 13  --  11 11 10   CREATININE 0.35* 0.33*  --  0.39* 0.47 0.35*  CALCIUM 8.5* 8.9  --  8.5* 8.6* 8.5*  MG  --   --   --   --   --  1.9  PHOS  --   --   --   --   --  1.9*   GFR: Estimated Creatinine Clearance: 55.8 mL/min (A) (by C-G formula based on SCr of 0.35 mg/dL (L)). Recent Labs  Lab 08/04/23 0650 08/06/23 1150 08/06/23 1212 08/07/23 0500 08/07/23 1730  PROCALCITON  --   --   --   --  <0.10  WBC 6.5 6.1  --  6.9  --   LATICACIDVEN  --   --  1.1  --   --     Liver Function Tests: Recent Labs  Lab 08/06/23 1150  AST 24  ALT 14  ALKPHOS 42  BILITOT 0.5  PROT 5.9*  ALBUMIN 2.8*   No results for input(s): "LIPASE", "AMYLASE" in the last 168 hours.  No results for input(s): "AMMONIA" in the last 168 hours.   ABG    Component Value Date/Time   PHART 7.273 (L) 01/18/2023 1310   PCO2ART 59.7 (H) 01/18/2023 1310   PO2ART 140 (H) 01/18/2023  1310   HCO3 32.6 (H) 08/06/2023 1211   TCO2 34 (H) 08/06/2023 1211   O2SAT 99 08/06/2023 1211     Coagulation Profile: No results for input(s): "INR", "PROTIME" in the last 168 hours.  Cardiac Enzymes: No results for input(s): "CKTOTAL", "CKMB", "CKMBINDEX", "TROPONINI" in the last 168 hours.  HbA1C: No results found for: "HGBA1C"  CBG: Recent Labs  Lab 08/08/23 0423 08/08/23 2014 08/08/23 2345 08/09/23 0445 08/09/23 0801  GLUCAP 120* 172* 198* 188* 219*    Duaine German, MD [facility] Office: [phone]   See Amion for personal pager PCCM on call pager [phone] until 7pm. Please call Elink 7p-7a. [phone]

## 2023-08-10 NOTE — Assessment & Plan Note (Addendum)
 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 Holding home tramadol , trazodone , gabapentin  due to AMS Neurogenic bladder: Suprapubic catheter. Hold home Vesicare and Mirabegron , cannot be crushed, aspiration risk Hypothyroidism: Home synthroid  25 daily.  SVT s/p ablation, complete heart block s/p pacemaker: CCM MS: Not on any DMTs Constipation: Home miralax  daily, docusate Mood: Home cymbalta  60 daily  Allergies: Home singulair  10 daily, xyzal (zyrtec ) 5 daily, flonase  daily prn  Sacral pressure injury - Wound care consulted

## 2023-08-11 ENCOUNTER — Encounter (HOSPITAL_COMMUNITY): Payer: Self-pay | Admitting: Pulmonary Disease

## 2023-08-11 DIAGNOSIS — B3741 Candidal cystitis and urethritis: Secondary | ICD-10-CM | POA: Diagnosis present

## 2023-08-11 DIAGNOSIS — N390 Urinary tract infection, site not specified: Secondary | ICD-10-CM | POA: Diagnosis not present

## 2023-08-11 DIAGNOSIS — G9341 Metabolic encephalopathy: Secondary | ICD-10-CM

## 2023-08-11 DIAGNOSIS — R8279 Other abnormal findings on microbiological examination of urine: Secondary | ICD-10-CM | POA: Insufficient documentation

## 2023-08-11 DIAGNOSIS — J690 Pneumonitis due to inhalation of food and vomit: Secondary | ICD-10-CM | POA: Diagnosis not present

## 2023-08-11 LAB — CULTURE, BLOOD (ROUTINE X 2): Culture: NO GROWTH

## 2023-08-11 LAB — BASIC METABOLIC PANEL WITH GFR
Anion gap: 13 (ref 5–15)
BUN: 7 mg/dL — ABNORMAL LOW (ref 8–23)
CO2: 25 mmol/L (ref 22–32)
Calcium: 8.7 mg/dL — ABNORMAL LOW (ref 8.9–10.3)
Chloride: 99 mmol/L (ref 98–111)
Creatinine, Ser: 0.37 mg/dL — ABNORMAL LOW (ref 0.44–1.00)
GFR, Estimated: >60 mL/min (ref >60)
Glucose, Bld: 107 mg/dL — ABNORMAL HIGH (ref 70–99)
Potassium: 3.6 mmol/L (ref 3.5–5.1)
Sodium: 137 mmol/L (ref 135–145)

## 2023-08-11 LAB — CBC WITH DIFFERENTIAL/PLATELET
Abs Immature Granulocytes: 0.11 10*3/uL — ABNORMAL HIGH (ref 0.00–0.07)
Basophils Absolute: 0.1 10*3/uL (ref 0.0–0.1)
Basophils Relative: 1 %
Eosinophils Absolute: 0.3 10*3/uL (ref 0.0–0.5)
Eosinophils Relative: 3 %
HCT: 48.9 % — ABNORMAL HIGH (ref 36.0–46.0)
Hemoglobin: 16.1 g/dL — ABNORMAL HIGH (ref 12.0–15.0)
Immature Granulocytes: 1 %
Lymphocytes Relative: 11 %
Lymphs Abs: 1.1 10*3/uL (ref 0.7–4.0)
MCH: 30.3 pg (ref 26.0–34.0)
MCHC: 32.9 g/dL (ref 30.0–36.0)
MCV: 91.9 fL (ref 80.0–100.0)
Monocytes Absolute: 0.6 10*3/uL (ref 0.1–1.0)
Monocytes Relative: 6 %
Neutro Abs: 7.9 10*3/uL — ABNORMAL HIGH (ref 1.7–7.7)
Neutrophils Relative %: 78 %
Platelets: 207 10*3/uL (ref 150–400)
RBC: 5.32 MIL/uL — ABNORMAL HIGH (ref 3.87–5.11)
RDW: 15.6 % — ABNORMAL HIGH (ref 11.5–15.5)
WBC: 10.2 10*3/uL (ref 4.0–10.5)
nRBC: 0 % (ref 0.0–0.2)

## 2023-08-11 LAB — CULTURE, BAL-QUANTITATIVE W GRAM STAIN: Culture: NO GROWTH — AB

## 2023-08-11 LAB — MAGNESIUM: Magnesium: 2.1 mg/dL (ref 1.7–2.4)

## 2023-08-11 LAB — PHOSPHORUS: Phosphorus: 3 mg/dL (ref 2.5–4.6)

## 2023-08-11 MED ORDER — POTASSIUM CHLORIDE 20 MEQ PO PACK
60.0000 meq | PACK | Freq: Once | ORAL | Status: AC
  Administered 2023-08-11: 60 meq via ORAL
  Filled 2023-08-11: qty 3

## 2023-08-11 NOTE — Assessment & Plan Note (Addendum)
 Stable mental status, alert and oriented to person and place. Question if this may be patient's new baseline beyond acute illness.  Per patient's home aid, patient has been having memory difficulties over past 6-7 months.  - Delirium precautions

## 2023-08-11 NOTE — Assessment & Plan Note (Addendum)
 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 Holding home tramadol , trazodone , gabapentin  due to AMS Neurogenic bladder: Suprapubic catheter. Hold home Vesicare and Mirabegron , cannot be crushed, aspiration risk Hypothyroidism: Home synthroid  25 daily.  SVT s/p ablation, complete heart block s/p pacemaker MS: Not on any DMTs Constipation: Home miralax  daily, docusate Mood: Home cymbalta  60 daily  Allergies: Home singulair  10 daily, xyzal (zyrtec ) 5 daily, flonase  daily prn  Sacral pressure injury - Wound care already consulted, appreciate recs
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
## 2023-08-11 NOTE — Progress Notes (Signed)
 Daily Progress Note Intern Pager: [phone]  Patient name: [name] Medical record number: [id number] Date of birth: May 19, 1945 Age: 78 y.o. Gender: female  Primary Care Provider: Gerrianne Krauss, PA-C Consultants: PCCM Code Status: Full  Pt Overview and Major Events to Date:  4/16: Admitted 4/18: Bronchoscopy  Assessment and Plan:  [name] 78 y.o. admitted for metabolic/infectious encephalopathy secondary to aspiriration pneumonia s/p bronchoscopy and MBS. PMH includes MS, spastic quadriplegia, complete heart block s/p pacemaker.  Assessment & Plan Aspiration pneumonia of left lung (HCC)  Bronchial obstruction Stable respiratory status on 2L O2.  Stable s/p bronchoscopy with PCCM. BAL culture with yeast and PMN.  - Appreciate ongoing PCCM recs - Zosyn  x 7 d course per PCCM (end 4/23) - Dysphagia diet with meds crushed in puree per SLP s/p MBS Positive urine culture Completed abx treatment for prior UTI. Urine cx with yeast.  - Per ID, possibly colonization and recommend exchanging suprapubic catheter  - Consult Urology for catheter exchange - Repeat urine culture following exchange Protein malnutrition (HCC) At risk for refeeding syndrome. Feeding tube without much demonstrated benefit for this patient scenario.   -Continue dysphagia diet -Daily BMP, Mag, Phos -Replete electrolytes as indicated Altered mental status Stable mental status, alert and oriented to person and place. Question if this may be patient's new baseline beyond acute illness.  Per patient's home aid, patient has been having memory difficulties over past 6-7 months.  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 Holding home tramadol , trazodone , gabapentin  due to AMS Neurogenic bladder: Suprapubic catheter. Hold home Vesicare and Mirabegron , cannot be crushed, aspiration risk Hypothyroidism: Home synthroid  25 daily.  SVT s/p ablation, complete  heart block s/p pacemaker MS: Not on any DMTs Constipation: Home miralax  daily, docusate Mood: Home cymbalta  60 daily  Allergies: Home singulair  10 daily, xyzal (zyrtec ) 5 daily, flonase  daily prn  Sacral pressure injury - Wound care already consulted, appreciate recs   FEN/GI: Dysphagia 1  PPx: Lovenox  Dispo: Home pending clinical improvement   Subjective:  Per patient's daughter, having issues with memory.  Per patient's health aid, patient has had memory issues over the past 6 to 7 months, has not seen somebody for this yet.  Objective: Temp:  [97.6 F (36.4 C)-98.4 F (36.9 C)] 98 F (36.7 C) (04/21 0410) Pulse Rate:  [48-100] 62 (04/21 0410) Resp:  [16-18] 16 (04/21 0410) BP: (108-162)/(74-101) 162/101 (04/21 0410) SpO2:  [96 %-99 %] 99 % (04/21 0410) Physical Exam: General: No acute distress resting comfortably in room. CV: Normal S1/S2. No extra heart sounds. Warm and well-perfused. Pulm: Breathing comfortably on 2L O2. CTAB anteriorly. No increased WOB. Abd: Soft, non-tender, non-distended. Skin/Ext:  Warm, dry.  Contracted upper extremities. Neuro: Alert and oriented to self, birthday, place.  Not oriented to year.  PEERRL.  Unable to follow commands.   Laboratory: Most recent CBC Lab Results  Component Value Date   WBC 6.9 08/07/2023   HGB 14.8 08/07/2023   HCT 45.1 08/07/2023   MCV 92.8 08/07/2023   PLT 224 08/07/2023   Most recent BMP    Latest Ref Rng & Units 08/10/2023    7:36 AM  BMP  Glucose 70 - 99 mg/dL 91   BUN 8 - 23 mg/dL 10   Creatinine 8.65 - 1.00 mg/dL 7.84   Sodium 696 - 295 mmol/L 137   Potassium 3.5 - 5.1 mmol/L 3.0   Chloride 98 - 111 mmol/L 99  CO2 22 - 32 mmol/L 24   Calcium 8.9 - 10.3 mg/dL 8.5     Erin Chapman, MD 08/11/2023, 7:05 AM  PGY-1, [facility] FPTS Intern pager: [phone], text pages welcome Secure chat group [facility] Teaching Service

## 2023-08-11 NOTE — Assessment & Plan Note (Addendum)
 Stable respiratory status on 2L O2.  Stable s/p bronchoscopy with PCCM. BAL culture with yeast and PMN.  - Appreciate ongoing PCCM recs - Zosyn  x 7 d course per PCCM (end 4/23) - Dysphagia diet with meds crushed in puree per SLP s/p MBS

## 2023-08-11 NOTE — Plan of Care (Signed)
  Problem: Clinical Measurements: Goal: Ability to maintain clinical measurements within normal limits will improve Outcome: Progressing Goal: Diagnostic test results will improve Outcome: Progressing Goal: Respiratory complications will improve Outcome: Progressing Goal: Cardiovascular complication will be avoided Outcome: Progressing   Problem: Coping: Goal: Level of anxiety will decrease Outcome: Progressing   Problem: Elimination: Goal: Will not experience complications related to bowel motility Outcome: Progressing Goal: Will not experience complications related to urinary retention Outcome: Progressing   Problem: Pain Managment: Goal: General experience of comfort will improve and/or be controlled Outcome: Progressing   Problem: Safety: Goal: Ability to remain free from injury will improve Outcome: Progressing   Problem: Skin Integrity: Goal: Risk for impaired skin integrity will decrease Outcome: Progressing   Problem: Education: Goal: Knowledge of General Education information will improve Description: Including pain rating scale, medication(s)/side effects and non-pharmacologic comfort measures Outcome: Not Progressing   Problem: Health Behavior/Discharge Planning: Goal: Ability to manage health-related needs will improve Outcome: Not Progressing   Problem: Clinical Measurements: Goal: Will remain free from infection Outcome: Not Progressing   Problem: Activity: Goal: Risk for activity intolerance will decrease Outcome: Not Progressing   Problem: Nutrition: Goal: Adequate nutrition will be maintained Outcome: Not Progressing

## 2023-08-11 NOTE — Assessment & Plan Note (Signed)
 Completed abx treatment for prior UTI. Urine cx with yeast.  - Per ID, possibly colonization and recommend exchanging suprapubic catheter  - Consult Urology for catheter exchange - Repeat urine culture following exchange

## 2023-08-11 NOTE — Care Management Important Message (Signed)
 Important Message  Patient Details  Name: [name] MRN: [id number] Date of Birth: May 26, 1945   Important Message Given:  Yes - Medicare IM     Wynonia Hedges 08/11/2023, 2:35 PM

## 2023-08-11 NOTE — Assessment & Plan Note (Addendum)
 At risk for refeeding syndrome. Feeding tube without much demonstrated benefit for this patient scenario.   -Continue dysphagia diet -Daily BMP, Mag, Phos -Replete electrolytes as indicated

## 2023-08-11 NOTE — Plan of Care (Signed)
 Spoke with patient's daughter Tyra Galley and provided updates on her condition.  Discussed need to change suprapubic catheter and reculture urine to determine if yeast continues to grow.  If yeast grows on next culture, given concurrent growth on BAL, will consider treatment for yeast.  However, discussed that I do not think that the yeast infection is the sole cause of her mother's altered mental status.  Also discussed that antibiotics prescribed at last admission did work to clear ESBL that she was diagnosed with before, so that is likely not the sole reason for her AMS.  Explained that her AMS is likely multifactorial with hospital delirium compounding.  Does not appear that patient had any concerns for dementia outpatient.  We discussed continuing Zosyn  for now and continuing to monitor for further yeast growth as above.  Daughter in agreement with plan.

## 2023-08-11 NOTE — Progress Notes (Signed)
 NAME:  [name], MRN:  [id number], DOB:  [birth year], LOS: 5 ADMISSION DATE:  08/06/2023, CONSULTATION DATE:  08/07/2023 REFERRING MD: McDiarmid, MD, CHIEF COMPLAINT: aspiration PNA   History of Present Illness:  A 78 yr old female patient 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, Echo: EF 65%, G1DD), SVT ablation, complete heart block s/p pacemaker, hypothyroidism, h/o gastric bypass, esophageal dysmotility, suprapubic catheter for neurogenic bladder and multiple UTIs (last one was one week ago), multiple sclerosis, constipation, moderate PCM, and post-traumatic quadriplegia who presented to ED yesterday with AMS for one week since last admission for UTI. She is not back to baseline according to her daughter and has low oral intake and lethargy. Last night she aspirated on her meds. Denied f/c/r, URTI symptoms, wheezing, CP, abd pain, N/V/D, rash, and LL edema. She has dry cough and mild SOB. Urine has sediment and changing the suprapubic cath q 3 weeks (due next week). Denied smoking, alcohol drinking, and illicit drug use. She is bedbound. Discharged on home O2 @ 3.5 L 3 days ago but she is not using it.   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 Retained or Aspirated secretions in the trachea with opacified left mainstem bronchus and subtotal left lung collapse/consolidation. Consider mucous plug versus aspiration pneumonia. Possible small  layering right pleural effusion. Right lung better aerated with mild dependent atelectasis, but patchy peripheral peribronchial opacity in the right middle and lower lobes could be developing infection. PCCM was consulted 4/17: failed ST eval and MBS ordered  Interim History / Subjective:   Offers no complaints Caretaker at bedside On 2 L nasal cannula  Objective   Blood pressure (!) 150/105, pulse 70, temperature 97.6 F (36.4 C), temperature source Oral, resp. rate 18, height 5\' 4"  (1.626 m), weight 68 kg, SpO2 98%.    FiO2 (%):  [28 %] 28 %   Intake/Output Summary (Last 24 hours) at 08/11/2023 1142 Last data filed at 08/11/2023 0500 Gross per 24 hour  Intake 872.28 ml  Output 2400 ml  Net -1527.72 ml    Filed Weights   08/06/23 1213 08/08/23 1123  Weight: 68 kg 68 kg    Examination: General: elderly woman, no acute distress, sitting up in bed HENT: PERRL, moist mucous membranes Lungs: Decreased breath sounds bilateral, no accessory muscle use Cardiovascular: rrr, no murmurs  Abdomen: no distension or tenderness. Suprapubic cath Extremities: trace feet edema. Symmetrical  Neuro: symmetrical facial appearance. Spastic upper limbs and quadriplegia    Bal 4/18 -Candida glabrata Labs show normal electrolytes  Resolved Hospital Problem list   Metabolic encephalopathy due to infection and poor hydration/intake  Assessment & Plan:  Chronic hypoxic resp failure (home O2 @ 3.5 L [place]) Left sided subtotal left lung collapse/consolidation, mucous plugs, and possible aspiration PNA Chest x-ray 4/19 shows improved left-sided infiltrate -O2 for SpO2 >92% -Tracheobronchial toilet with Duoneb, chest PT and hypertonic saline neb -Zosyn  plan for 7 day course -HOB elevation -Aspiration precautions   - Candida glabrata in lungs may be considered colonization,  however she does have this in at least 2 sites and we may consider treatment, recommend ID input   Possible complicated UTI,  s/p suprapubic catheter for neurogenic bladder -Zosyn    Oral thrush -Nystatin      High aspiration risk with poor oral intake and moderate PCM -MBS study completed with dysphagia I diet recommended  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, Echo: EF 65%, G1DD), SVT ablation, CHB s/p PPM Hypothyroidism Esophageal dysmotility/gastric bypass surgery Multiple sclerosis Constipation Post-traumatic quadriplegia   FULL CODE Updated daughter over the phone  Best Practice (right click and "Reselect all SmartList Selections" daily)   Diet/type: dysphagia diet (see orders) DVT prophylaxis LMWH Pressure ulcer(s): N/A GI prophylaxis: H2B Lines: N/A Foley:  Yes, and it is still needed Code Status:  full code Last date of multidisciplinary goals of care discussion []   Labs   CBC: Recent Labs  Lab 08/06/23 1150 08/06/23 1211 08/07/23 0500  WBC 6.1  --  6.9  NEUTROABS 3.4  --   --   HGB 14.4 14.6 14.8  HCT 45.2 43.0 45.1  MCV 94.8  --  92.8  PLT 217  --  224    Basic Metabolic Panel: Recent Labs  Lab 08/06/23 1150 08/06/23 1211 08/07/23 0500 08/08/23 0538 08/10/23 0736 08/11/23 0610  NA 141 139 136 136 137 137  K 4.5 4.8 4.0 3.6 3.0* 3.6  CL 105  --  100 99 99 99  CO2 28  --  24 24 24 25   GLUCOSE 101*  --  80 123* 91 107*  BUN 13  --  11 11 10  7*  CREATININE 0.33*  --  0.39* 0.47 0.35* 0.37*  CALCIUM 8.9  --  8.5* 8.6* 8.5* 8.7*  MG  --   --   --   --  1.9 2.1  PHOS  --   --   --   --  1.9* 3.0   GFR: Estimated Creatinine Clearance: 55.8 mL/min (A) (by C-G formula based on SCr of 0.37 mg/dL (L)). Recent Labs  Lab 08/06/23 1150 08/06/23 1212 08/07/23 0500 08/07/23 1730  PROCALCITON  --   --   --  <0.10  WBC 6.1  --  6.9  --   LATICACIDVEN  --  1.1  --   --     Liver Function Tests: Recent Labs  Lab 08/06/23 1150  AST 24  ALT 14  ALKPHOS 42  BILITOT 0.5  PROT 5.9*  ALBUMIN 2.8*   No results for input(s): "LIPASE", "AMYLASE" in the last 168 hours.  No results for  input(s): "AMMONIA" in the last 168 hours.   ABG    Component Value Date/Time   PHART 7.273 (L) 01/18/2023 1310   PCO2ART 59.7 (H) 01/18/2023 1310   PO2ART 140 (H) 01/18/2023 1310   HCO3 32.6 (H) 08/06/2023 1211   TCO2 34 (H) 08/06/2023 1211   O2SAT 99 08/06/2023 1211     Coagulation Profile: No results for input(s): "INR", "PROTIME" in the last 168 hours.  Cardiac Enzymes: No results for input(s): "CKTOTAL", "CKMB", "CKMBINDEX", "TROPONINI" in the last 168 hours.  HbA1C: No results found for: "HGBA1C"  CBG: Recent Labs  Lab 08/08/23 0423 08/08/23 2014 08/08/23 2345 08/09/23 0445 08/09/23 0801  GLUCAP 120* 172* 198* 188* 219*    Celene Coins MD. FCCP. [facility] Pager : [phone]  If no response to pager , please call [phone] until 7 pm After 7:00 pm call Elink  [phone]   08/11/2023

## 2023-08-11 NOTE — Progress Notes (Signed)
 Speech Language Pathology Treatment: Dysphagia  Patient Details Name: [name] MRN: [id number] DOB: April 26, 1945 Today's Date: 08/11/2023 Time: 1410-1420 SLP Time Calculation (min) (ACUTE ONLY): 10 min  Assessment / Plan / Recommendation Clinical Impression  Patient seen by SLP for skilled treatment focused on dysphagia goals. She was awake, alert but not interacting as she had been over the weekend. Her hired caregiver in the room reported that she had some juice to drink today but has refusing most PO's. Patient had some saliva/secretions that were tan in color leaking from  her left side of mouth. Caregiver reported that this has been occurring all day. SLP questioned if it was saliva mixed with some of her vanilla Ensure. Patient allowed for minimal oral care and did suck water off toothette sponge however then refused to participate. SLP discussed overall POC with caregiver and will continue to follow.     HPI HPI: [name] is 78 y.o. female admitted for aspiration pneumonia of left lung. Recent prior admission for altered mental status likely secondary to infectious encephalopathy (urosepsis). Pertinent PMH/PSH includes MS, spastic quadriplegia, CHF s/p pacemaker placement. Pt had a MBS on 01/22/23 that indicated penetration and aspiration with thins (silent) and nectar thick and deep penetration with HTL. SLP recommended NTL to reduce likelihood of repeated aspiration. Per family reports, the pt did not adhere to nectar thick liquid while at home and has been on thin liquid.      SLP Plan  Continue with current plan of care      Recommendations for follow up therapy are one component of a multi-disciplinary discharge planning process, led by the attending physician.  Recommendations may be updated based on patient status, additional functional criteria and insurance authorization.    Recommendations  Diet recommendations: Dysphagia 1 (puree);Thin liquid Liquids provided  via: Cup;Teaspoon;No straw Medication Administration: Crushed with puree Supervision: Full supervision/cueing for compensatory strategies;Staff to assist with self feeding Compensations: Slow rate;Small sips/bites;Follow solids with liquid Postural Changes and/or Swallow Maneuvers: Seated upright 90 degrees                  Staff/trained caregiver to provide oral care;Oral care BID   Frequent or constant Supervision/Assistance Dysphagia, oropharyngeal phase (R13.12)     Continue with current plan of care    Jacqualine Mater, MA, CCC-SLP Speech Therapy

## 2023-08-12 DIAGNOSIS — R54 Age-related physical debility: Secondary | ICD-10-CM

## 2023-08-12 DIAGNOSIS — R638 Other symptoms and signs concerning food and fluid intake: Secondary | ICD-10-CM | POA: Diagnosis not present

## 2023-08-12 DIAGNOSIS — G9341 Metabolic encephalopathy: Secondary | ICD-10-CM | POA: Diagnosis not present

## 2023-08-12 DIAGNOSIS — R1313 Dysphagia, pharyngeal phase: Secondary | ICD-10-CM | POA: Diagnosis not present

## 2023-08-12 DIAGNOSIS — J690 Pneumonitis due to inhalation of food and vomit: Secondary | ICD-10-CM | POA: Diagnosis not present

## 2023-08-12 LAB — CBC
HCT: 44.7 % (ref 36.0–46.0)
Hemoglobin: 15.2 g/dL — ABNORMAL HIGH (ref 12.0–15.0)
MCH: 31 pg (ref 26.0–34.0)
MCHC: 34 g/dL (ref 30.0–36.0)
MCV: 91.2 fL (ref 80.0–100.0)
Platelets: 243 10*3/uL (ref 150–400)
RBC: 4.9 MIL/uL (ref 3.87–5.11)
RDW: 15.5 % (ref 11.5–15.5)
WBC: 11.2 10*3/uL — ABNORMAL HIGH (ref 4.0–10.5)
nRBC: 0 % (ref 0.0–0.2)

## 2023-08-12 LAB — AEROBIC/ANAEROBIC CULTURE W GRAM STAIN (SURGICAL/DEEP WOUND)

## 2023-08-12 LAB — BLOOD GAS, VENOUS
Acid-Base Excess: 4.2 mmol/L — ABNORMAL HIGH (ref 0.0–2.0)
Bicarbonate: 28.4 mmol/L — ABNORMAL HIGH (ref 20.0–28.0)
O2 Saturation: 90 %
Patient temperature: 36.6
pCO2, Ven: 39 mmHg — ABNORMAL LOW (ref 44–60)
pH, Ven: 7.47 — ABNORMAL HIGH (ref 7.25–7.43)
pO2, Ven: 57 mmHg — ABNORMAL HIGH (ref 32–45)

## 2023-08-12 LAB — BASIC METABOLIC PANEL WITH GFR
Anion gap: 12 (ref 5–15)
BUN: 11 mg/dL (ref 8–23)
CO2: 21 mmol/L — ABNORMAL LOW (ref 22–32)
Calcium: 8.9 mg/dL (ref 8.9–10.3)
Chloride: 106 mmol/L (ref 98–111)
Creatinine, Ser: 0.41 mg/dL — ABNORMAL LOW (ref 0.44–1.00)
GFR, Estimated: >60 mL/min (ref >60)
Glucose, Bld: 135 mg/dL — ABNORMAL HIGH (ref 70–99)
Potassium: 4.6 mmol/L (ref 3.5–5.1)
Sodium: 139 mmol/L (ref 135–145)

## 2023-08-12 LAB — GLUCOSE, CAPILLARY: Glucose-Capillary: 152 mg/dL — ABNORMAL HIGH (ref 70–99)

## 2023-08-12 LAB — MAGNESIUM: Magnesium: 2.3 mg/dL (ref 1.7–2.4)

## 2023-08-12 LAB — PHOSPHORUS: Phosphorus: 3.2 mg/dL (ref 2.5–4.6)

## 2023-08-12 MED ORDER — DEXTROSE IN LACTATED RINGERS 5 % IV SOLN: INTRAVENOUS | Status: DC

## 2023-08-12 MED ORDER — ACETAMINOPHEN 10 MG/ML IV SOLN
1000.0000 mg | Freq: Four times a day (QID) | INTRAVENOUS | Status: AC
  Administered 2023-08-13 (×2): 1000 mg via INTRAVENOUS
  Filled 2023-08-12 (×3): qty 100

## 2023-08-12 MED ORDER — DEXTROSE-SODIUM CHLORIDE 5-0.9 % IV SOLN: INTRAVENOUS | Status: AC

## 2023-08-12 NOTE — Evaluation (Signed)
 Physical Therapy Evaluation Patient Details Name: [name] MRN: [id number] DOB: April 06, 1946 Today's Date: 08/12/2023  History of Present Illness  [name] 78 y.o. admitted for metabolic/infectious encephalopathy secondary to likely aspiration pneumonia s/p bronchoscopy and MBS.  Receiving Zosyn .  Completed UTI treatment, pending suprapubic catheter exchange.  PMH includes MS, spastic quadriplegia, complete heart block s/p pacemaker.   Clinical Impression  [name] is 78 y.o. female admitted with above HPI and diagnosis. Patient is currently limited by functional impairments below (see PT problem list). Patient lives with family and is dependent on total assist for all mobility with mechanical lift and ADL's with 24/7 caregiver assist at baseline. Patient evaluated by Physical Therapy with no further acute PT needs identified. All education has been completed and the patient has no further questions. Provided HEP for gentle PROM to pt's daughter for home use. Discussed benefits of hospital bed at home to improve ease of transfers and ability to complete ADL's and PROM at bed level. See below for any follow-up Physical Therapy or equipment needs. PT is signing off. Thank you for this referral.     HEP: Login: [email or web address]    Access Code: [email or web address]    Date printed: 08/12/2023    If plan is discharge home, recommend the following: Two people to help with walking and/or transfers;Two people to help with bathing/dressing/bathroom;Assistance with cooking/housework;Assistance with feeding;Direct supervision/assist for medications management;Direct supervision/assist for financial management;Assist for transportation;Help with stairs or ramp for entrance;Supervision due to cognitive status   Can travel by private vehicle        Equipment Recommendations Hospital bed  Recommendations for Other Services       Functional Status Assessment Patient has not had a  recent decline in their functional status     Precautions / Restrictions Precautions Precautions: Fall Recall of Precautions/Restrictions: Intact Precaution/Restrictions Comments: quadriplegia at baseline Restrictions Weight Bearing Restrictions Per Provider Order: No      Mobility  Bed Mobility Overal bed mobility: Needs Assistance             General bed mobility comments: Total A for all aspects of mobility    Transfers                        Ambulation/Gait                  Stairs            Wheelchair Mobility     Tilt Bed    Modified Rankin (Stroke Patients Only)       Balance                                             Pertinent Vitals/Pain Pain Assessment Faces Pain Scale: No hurt Breathing: normal Negative Vocalization: none Facial Expression: smiling or inexpressive Body Language: relaxed Consolability: no need to console PAINAD Score: 0 Facial Expression: Relaxed, neutral Pain Intervention(s): Monitored during session    Home Living Family/patient expects to be discharged to:: Private residence Living Arrangements: Children Available Help at Discharge: Family;Personal care attendant Type of Home: House Home Access: Ramped entrance       Home Layout: One level Home Equipment: Wheelchair - power;Other (comment) Additional Comments: has a powered lift with ceiling track in home for bed<>WC and to take pt into shower, new power WC (tilt in space) with  roho cusion, new car that transports WC    Prior Function Prior Level of Function : Needs assist       Physical Assist : Mobility (physical);ADLs (physical) Mobility (physical): Bed mobility;Transfers ADLs (physical): Feeding;Grooming;Bathing;Dressing;Toileting Mobility Comments: dependent on lift, aides, power W/C, is OOB most of the day ADLs Comments: special sling for shower and able to be washed from sling; toileting via hoyer with use of  suppository. Total A with all ADLs as well as feeding at baseline. pt unable to use her power chair for a year.     Extremity/Trunk Assessment   Upper Extremity Assessment Upper Extremity Assessment: Defer to OT evaluation RUE Deficits / Details: contractures to PIP J of each digit. Othwerise rests in flexed position. bil belbow contractures. 0-90 degrees flexion with PROM, skin integrity intact LUE Deficits / Details: rests in flexed position. Able to be moved through most of range with limitations in digit flexion. Othwerise rests in flexed position. bil belbow contractures, L wrist drop    Lower Extremity Assessment Lower Extremity Assessment: RLE deficits/detail;LLE deficits/detail RLE Deficits / Details: chronic quadriplegia, pt reports sensation intact, no movement. ROM limted ~5* shy of neutral at ankle, ROM WFL at knee and hip LLE Deficits / Details: chronic quadriplegia, pt reports sensation intact, no movement. ROM limted ~10* shy of neutral at ankle, ROM WFL at knee and hip though testing limited at hip given past crush injury to Lt hip.    Cervical / Trunk Assessment Cervical / Trunk Assessment: Kyphotic;Other exceptions Cervical / Trunk Exceptions: hx of back and neck surgery  Communication   Communication Communication: Impaired Factors Affecting Communication: Difficulty expressing self    Cognition Arousal: Alert Behavior During Therapy: Flat affect   PT - Cognitive impairments: Difficult to assess Difficult to assess due to: Impaired communication                     PT - Cognition Comments: pt tracking with eyes but no communicating. nods head up and down when asked direct questions but unable to shake head for "no" when cued. unsure how much pt is comprehending. Following commands: Impaired       Cueing Cueing Techniques: Verbal cues     General Comments      Exercises Other Exercises Other Exercises: PROM to bil LE: 5 reps hip/knee flex, ankle  DF/PF, hip IR/ER, Hip abd/add Other Exercises: PROM to bil UE: 5 reps shoulder flex, abd, elbow ext/flex, wrist flex/ext   Assessment/Plan    PT Assessment Patient does not need any further PT services  PT Problem List         PT Treatment Interventions      PT Goals (Current goals can be found in the Care Plan section)  Acute Rehab PT Goals Patient Stated Goal: none stated PT Goal Formulation: All assessment and education complete, DC therapy Time For Goal Achievement: 08/19/23 Potential to Achieve Goals: Good    Frequency       Co-evaluation               AM-PAC PT "6 Clicks" Mobility  Outcome Measure Help needed turning from your back to your side while in a flat bed without using bedrails?: Total Help needed moving from lying on your back to sitting on the side of a flat bed without using bedrails?: Total Help needed moving to and from a bed to a chair (including a wheelchair)?: Total Help needed standing up from a chair using your arms (  e.g., wheelchair or bedside chair)?: Total Help needed to walk in hospital room?: Total Help needed climbing 3-5 steps with a railing? : Total 6 Click Score: 6    End of Session   Activity Tolerance: Patient tolerated treatment well Patient left: in bed;with call bell/phone within reach;with family/visitor present Nurse Communication: Mobility status;Need for lift equipment PT Visit Diagnosis: Muscle weakness (generalized) (M62.81)    Time: 4098-1191 PT Time Calculation (min) (ACUTE ONLY): 23 min   Charges:     PT Treatments $Therapeutic Activity: 8-22 mins PT General Charges $$ ACUTE PT VISIT: 1 Visit         Tish Forge, DPT Acute Rehabilitation Services Office [phone]  08/12/23 5:05 PM

## 2023-08-12 NOTE — Plan of Care (Addendum)
 Pt BP elevated during night. SBP 150-170 and DBP 102-111. On call MD notified. Requested to monitor. If not improved call back. BP improved and DBP below 100. Pt alert to self. Follows commands at times. This am pt refused to take synthroid .  Pt wouldn't open mouth. When speak name pt would follow speech and then immediately turn eyes opposite direction and close eyes.  Problem: Activity: Goal: Risk for activity intolerance will decrease Outcome: Not Progressing  Pt is paralyzed currently at max mobility Problem: Nutrition: Goal: Adequate nutrition will be maintained Outcome: Not Progressing  Poor po intake continues.  Problem: Elimination: Goal: Will not experience complications related to urinary retention Outcome: Not Progressing  Suprapubic cath in place.  Problem: Education: Goal: Knowledge of General Education information will improve Description: Including pain rating scale, medication(s)/side effects and non-pharmacologic comfort measures Outcome: Progressing   Problem: Health Behavior/Discharge Planning: Goal: Ability to manage health-related needs will improve Outcome: Progressing   Problem: Clinical Measurements: Goal: Ability to maintain clinical measurements within normal limits will improve Outcome: Progressing Goal: Will remain free from infection Outcome: Progressing Goal: Diagnostic test results will improve Outcome: Progressing Goal: Respiratory complications will improve Outcome: Progressing Goal: Cardiovascular complication will be avoided Outcome: Progressing   Problem: Coping: Goal: Level of anxiety will decrease Outcome: Progressing   Problem: Elimination: Goal: Will not experience complications related to bowel motility Outcome: Progressing   Problem: Pain Managment: Goal: General experience of comfort will improve and/or be controlled Outcome: Progressing   Problem: Safety: Goal: Ability to remain free from injury will improve Outcome:  Progressing   Problem: Skin Integrity: Goal: Risk for impaired skin integrity will decrease Outcome: Progressing

## 2023-08-12 NOTE — Plan of Care (Signed)
 Palliative consult ordered. Refusing to take meds, food or snacks by mouth. AM meds crushed and put in applesauce and she allowed it to come back out of mouth without swallowing. Refused to allow nystatin  swab to mouth    Problem: Clinical Measurements: Goal: Respiratory complications will improve Outcome: Progressing   Problem: Nutrition: Goal: Adequate nutrition will be maintained Outcome: Progressing   Problem: Safety: Goal: Ability to remain free from injury will improve Outcome: Progressing   Problem: Skin Integrity: Goal: Risk for impaired skin integrity will decrease Outcome: Progressing

## 2023-08-12 NOTE — Progress Notes (Signed)
 Daily Progress Note Intern Pager: [phone]  Patient name: [name] Medical record number: [id number] Date of birth: [birth year] Age: 78 y.o. Gender: female  Primary Care Provider: Gerrianne Krauss, PA-C Consultants: PCCM Code Status: Full  Pt Overview and Major Events to Date:  4/16: Admitted 4/18: Bronchoscopy  Assessment and Plan:  [name] 78 y.o. admitted for metabolic/infectious encephalopathy secondary to likely aspiration pneumonia s/p bronchoscopy and MBS.  Receiving Zosyn .  Completed UTI treatment, pending suprapubic catheter exchange.  PMH includes MS, spastic quadriplegia, complete heart block s/p pacemaker. Assessment & Plan Aspiration pneumonia of left lung (HCC)  Bronchial obstruction Stable respiratory status on 2L O2.  Stable s/p bronchoscopy with PCCM. BAL culture with yeast and PMN.  - Appreciate ongoing PCCM recs - BAL yeast may be colonization  - Zosyn  x 7 d course per PCCM (end 4/23) - Dysphagia 1 diet with meds crushed in puree per SLP s/p MBS Positive urine culture Completed abx treatment for prior UTI. Urine cx with yeast.  - Per ID, possibly colonization and recommend exchanging suprapubic catheter - exchanged this morning  - Repeat urine culture following exchange Protein malnutrition (HCC) Refeeding labs unremarkable.  -Continue dysphagia diet Altered mental status Sleepier today, not answering orientation questions. Per patient's home aid, patient has been having memory difficulties over past 6-7 months.  - VBG - PM check  - Delirium precautions  Chronic health problem Spastic quadriplegia: Home baclofen  20 TID. Holding home tramadol , trazodone , gabapentin  due to AMS Neurogenic bladder: Suprapubic catheter. Hold home Vesicare and Mirabegron , cannot be crushed, aspiration risk Hypothyroidism: Home synthroid  25 daily.  SVT s/p ablation, complete heart block s/p pacemaker MS: Not on any DMTs Constipation: Home miralax  daily,  docusate Mood: Home cymbalta  60 daily  Allergies: Home singulair  10 daily, xyzal (zyrtec ) 5 daily, flonase  daily prn  Sacral pressure injury - Wound care already consulted, appreciate recs   FEN/GI: Dysphagia 1 per SLP PPx: Lovenox  Dispo: Home pending clinical improvement   Subjective:  Per aid and daughter, patient seems less alert and interactive over the last day.   Objective: Temp:  [97.6 F (36.4 C)-98.3 F (36.8 C)] 98.2 F (36.8 C) (04/22 0359) Pulse Rate:  [58-90] 62 (04/22 0359) Resp:  [16-18] 18 (04/22 0359) BP: (103-174)/(64-112) 166/95 (04/22 0359) SpO2:  [95 %-99 %] 99 % (04/22 0359) FiO2 (%):  [28 %] 28 % (04/21 0803) Physical Exam: General: No acute distress.  CV: Normal S1/S2. No extra heart sounds.  Pulm: Breathing comfortably on room air. CTAB anteriorly. No increased WOB. Abd: Soft, non-tender, non-distended. Neuro: Sleepy, easily alerted. Unable to follow commands. Does not respond to orientation questions.   Laboratory: Most recent CBC Lab Results  Component Value Date   WBC 10.2 08/11/2023   HGB 16.1 (H) 08/11/2023   HCT 48.9 (H) 08/11/2023   MCV 91.9 08/11/2023   PLT 207 08/11/2023   Most recent BMP    Latest Ref Rng & Units 08/11/2023    6:10 AM  BMP  Glucose 70 - 99 mg/dL 147   BUN 8 - 23 mg/dL 7   Creatinine 8.29 - 5.62 mg/dL 1.30   Sodium 865 - 784 mmol/L 137   Potassium 3.5 - 5.1 mmol/L 3.6   Chloride 98 - 111 mmol/L 99   CO2 22 - 32 mmol/L 25   Calcium 8.9 - 10.3 mg/dL 8.7     Carey Chapman, MD 08/12/2023, 7:12 AM  PGY-1, [facility] FPTS Intern pager: [phone], text  pages welcome Secure chat group [facility] Teaching Service

## 2023-08-12 NOTE — Assessment & Plan Note (Signed)
 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 Holding home tramadol , trazodone , gabapentin  due to AMS Neurogenic bladder: Suprapubic catheter. Hold home Vesicare and Mirabegron , cannot be crushed, aspiration risk Hypothyroidism: Home synthroid  25 daily.  SVT s/p ablation, complete heart block s/p pacemaker MS: Not on any DMTs Constipation: Home miralax  daily, docusate Mood: Home cymbalta  60 daily  Allergies: Home singulair  10 daily, xyzal (zyrtec ) 5 daily, flonase  daily prn  Sacral pressure injury - Wound care already consulted, appreciate recs

## 2023-08-12 NOTE — Assessment & Plan Note (Addendum)
 Completed abx treatment for prior UTI. Urine cx with yeast.  - Per ID, possibly colonization and recommend exchanging suprapubic catheter - exchanged this morning  - Repeat urine culture following exchange

## 2023-08-12 NOTE — Plan of Care (Signed)
 Saw patient at bedside. Remains sleepy though easily awoken. Able to track family members in the room. Able to nod when I ask for her name. Unable to verbalize any responses. No acute distress otherwise. Unremarkable VBG and BG. Suspect waxing/waning delirium.   Learned from family and nursing that patient has been refusing/spitting up her crushed medications today. Ordered scheduled IV tylenol  in the meantime. Cont plan for IVF with D5NS.

## 2023-08-12 NOTE — Progress Notes (Signed)
   08/11/23 2245 08/11/23 2253 08/11/23 2334  Assess: MEWS Score  Temp  --   --  97.7 F (36.5 C)  BP (!) 154/105 (!) 174/106 (!) 156/111  MAP (mmHg)  --  126 126  Pulse Rate  --  69 69  Resp  --   --  18  SpO2  --   --  99 %  O2 Device  --   --  Room Air   On call Family Medicine aware. Monitor recheck in 1 hour.

## 2023-08-12 NOTE — Evaluation (Addendum)
 Occupational Therapy Evaluation Patient Details Name: [name] MRN: [id number] DOB: 03/11/1946 Today's Date: 08/12/2023   History of Present Illness   [name] 78 y.o. admitted for metabolic/infectious encephalopathy secondary to likely aspiration pneumonia s/p bronchoscopy and MBS.  Receiving Zosyn .  Completed UTI treatment, pending suprapubic catheter exchange.  PMH includes MS, spastic quadriplegia, complete heart block s/p pacemaker.     Clinical Impressions Pt is at total A baseline level of function. PTA pt hs PCA 12 hrs/day 7 day/wk, uses special sling for shower and able to be washed from sling; toileting via hoyer, total A with all ADLs as well as feeding at baseline, pt has been unable to use her power chair for a year, RoHo cushion and is dependent on lift, is OOB ~2 hours. OT fitted B hand splints appropriately for pt and pt tolerated PROM of B UEs without signs of pain/discomfort. Pt's caregiver/family educated on B UE PROM/gentle stretching, proper donning of  pre fabricated Soft Pro WHFO B hand splints, skin checks and wear schedule (pt's family/caregivers familiar from previous splints). Pt's caregiver reports that pt had splints previously at home but did not like wearing them much. All education completed and no further acute OT services are indicated at this time    If plan is discharge home, recommend the following:   Other (comment) (total A)     Functional Status Assessment   Patient has not had a recent decline in their functional status     Equipment Recommendations   None recommended by OT     Recommendations for Other Services         Precautions/Restrictions   Precautions Precautions: Fall Recall of Precautions/Restrictions: Intact Precaution/Restrictions Comments: quadriplegia at baseline Restrictions Weight Bearing Restrictions Per Provider Order: No     Mobility Bed Mobility Overal bed mobility: Needs Assistance              General bed mobility comments: total A for repositioning    Transfers Overall transfer level: Needs assistance                 General transfer comment: requires lift at baseline      Balance                                           ADL either performed or assessed with clinical judgement   ADL Overall ADL's : At baseline                                       General ADL Comments: total A focus session on UE ROM HEP, B hand splints, skin checks ad wear schedule with caregiver educated.     Vision Patient Visual Report: No change from baseline       Perception         Praxis         Pertinent Vitals/Pain Pain Assessment Pain Assessment: No/denies pain Pain Score: 0-No pain Pain Intervention(s): Monitored during session     Extremity/Trunk Assessment Upper Extremity Assessment Upper Extremity Assessment: Generalized weakness;RUE deficits/detail;LUE deficits/detail RUE Deficits / Details: contractures to PIP J of each digit. Othwerise rests in flexed position. bil belbow contractures. 0-90 degrees flexion with PROM, skin integrity intact LUE Deficits / Details: rests in flexed position. Able to be moved  through most of range with limitations in digit flexion. Othwerise rests in flexed position. bil belbow contractures, L wrist drop   Lower Extremity Assessment Lower Extremity Assessment: Defer to PT evaluation   Cervical / Trunk Assessment Cervical / Trunk Exceptions: hx of back and neck surgery   Communication Communication Communication:  (non verbal but family reports that she is talkative)   Cognition Arousal: Alert Behavior During Therapy: Flat affect Cognition: Cognition impaired             OT - Cognition Comments: pt non verbal, visuallt attends but does not speak                 Following commands: Intact       Cueing  General Comments   Cueing Techniques: Verbal cues       Exercises Other Exercises Other Exercises: PROM to BUE with caregiver education provided and pt caregiver/aide with adequate demo after education regarding proper fit of  B hand splints Other Exercises: B hand splints fitted properly by OT with [facility] caregiver educated on donning   Shoulder Instructions      Home Living Family/patient expects to be discharged to:: Private residence Living Arrangements: Children Available Help at Discharge: Family;Personal care attendant Type of Home: House Home Access: Ramped entrance     Home Layout: One level               Home Equipment: Wheelchair - power   Additional Comments: hoyer built in to take pt into shower and bed, new power WC with roho cusion, new car that transports WC      Prior Functioning/Environment Prior Level of Function : Needs assist       Physical Assist : Mobility (physical);ADLs (physical) Mobility (physical): Bed mobility;Transfers ADLs (physical): Feeding;Grooming;Bathing;Dressing;Toileting Mobility Comments: dependent on lift, aides, power W/C, is OOB ~2 hours ADLs Comments: special sling for shower and able to be washed from sling; toileting via hoyer with use of suppository. Total A with all ADLs as well as feeding at baseline. pt unable to use her power chair for a year.    OT Problem List: Decreased cognition;Other (comment);Decreased range of motion;Decreased strength (baseline quadriplegia)   OT Treatment/Interventions: Self-care/ADL training;Therapeutic exercise;DME and/or AE instruction;Balance training;Patient/family education;Therapeutic activities;Cognitive remediation/compensation      OT Goals(Current goals can be found in the care plan section)       OT Frequency:       Co-evaluation              AM-PAC OT "6 Clicks" Daily Activity     Outcome Measure Help from another person eating meals?: Total Help from another person taking care of personal grooming?: Total Help from  another person toileting, which includes using toliet, bedpan, or urinal?: Total Help from another person bathing (including washing, rinsing, drying)?: Total Help from another person to put on and taking off regular upper body clothing?: Total Help from another person to put on and taking off regular lower body clothing?: Total 6 Click Score: 6   End of Session Equipment Utilized During Treatment: Other (comment) (B hand splints) Nurse Communication: Mobility status  Activity Tolerance:   Patient left: in bed;with bed alarm set;with call bell/phone within reach;with family/visitor present  OT Visit Diagnosis: Muscle weakness (generalized) (M62.81);Hemiplegia and hemiparesis Hemiplegia - caused by: Unspecified (Quadriplegia)                Time: 1610-9604 OT Time Calculation (min): 29 min Charges:  OT General Charges $  OT Visit: 1 Visit OT Evaluation $OT Eval Moderate Complexity: 1 Mod OT Treatments $Self Care/Home Management : 8-22 mins    Alfred Ann 08/12/2023, 3:32 PM

## 2023-08-12 NOTE — Progress Notes (Signed)
 Pt has contractures in bilateral extremities. No suitable vessels identified for PIV insertion-unable to fully assess inner aspect of forearms. No cephalic vein found in bilateral upper arms. Recommend alternative access (central access/IR) for further intravenous needs.

## 2023-08-12 NOTE — Progress Notes (Signed)
 SLP Cancellation Note  Patient Details Name: [name] MRN: [id number] DOB: 06/27/1945   Cancelled treatment:       Reason Eval/Treat Not Completed: Other (comment) SLP attempted to see patient in AM and again in later PM, however she has been refusing all PO's per family and nursing. Patient is awake, alert but not interactive and no attempts to communicate.  Jacqualine Mater, MA, CCC-SLP Speech Therapy

## 2023-08-12 NOTE — Assessment & Plan Note (Addendum)
 Sleepier today, not answering orientation questions. Per patient's home aid, patient has been having memory difficulties over past 6-7 months.  - VBG - PM check  - Delirium precautions

## 2023-08-12 NOTE — Assessment & Plan Note (Addendum)
 Stable respiratory status on 2L O2.  Stable s/p bronchoscopy with PCCM. BAL culture with yeast and PMN.  - Appreciate ongoing PCCM recs - BAL yeast may be colonization  - Zosyn  x 7 d course per PCCM (end 4/23) - Dysphagia 1 diet with meds crushed in puree per SLP s/p MBS

## 2023-08-12 NOTE — Assessment & Plan Note (Addendum)
 Refeeding labs unremarkable.  -Continue dysphagia diet

## 2023-08-13 DIAGNOSIS — G9341 Metabolic encephalopathy: Secondary | ICD-10-CM | POA: Diagnosis not present

## 2023-08-13 DIAGNOSIS — Z7189 Other specified counseling: Secondary | ICD-10-CM | POA: Diagnosis not present

## 2023-08-13 DIAGNOSIS — R1313 Dysphagia, pharyngeal phase: Secondary | ICD-10-CM

## 2023-08-13 DIAGNOSIS — R410 Disorientation, unspecified: Secondary | ICD-10-CM | POA: Diagnosis not present

## 2023-08-13 DIAGNOSIS — Z515 Encounter for palliative care: Secondary | ICD-10-CM

## 2023-08-13 DIAGNOSIS — J690 Pneumonitis due to inhalation of food and vomit: Secondary | ICD-10-CM

## 2023-08-13 DIAGNOSIS — R54 Age-related physical debility: Secondary | ICD-10-CM

## 2023-08-13 DIAGNOSIS — R638 Other symptoms and signs concerning food and fluid intake: Secondary | ICD-10-CM

## 2023-08-13 LAB — URINE CULTURE: Culture: >=100000 — AB

## 2023-08-13 LAB — CBC WITH DIFFERENTIAL/PLATELET
Abs Immature Granulocytes: 0.1 10*3/uL — ABNORMAL HIGH (ref 0.00–0.07)
Basophils Absolute: 0.1 10*3/uL (ref 0.0–0.1)
Basophils Relative: 1 %
Eosinophils Absolute: 0.1 10*3/uL (ref 0.0–0.5)
Eosinophils Relative: 1 %
HCT: 43 % (ref 36.0–46.0)
Hemoglobin: 14.3 g/dL (ref 12.0–15.0)
Immature Granulocytes: 1 %
Lymphocytes Relative: 12 %
Lymphs Abs: 1.2 10*3/uL (ref 0.7–4.0)
MCH: 30.3 pg (ref 26.0–34.0)
MCHC: 33.3 g/dL (ref 30.0–36.0)
MCV: 91.1 fL (ref 80.0–100.0)
Monocytes Absolute: 0.6 10*3/uL (ref 0.1–1.0)
Monocytes Relative: 5 %
Neutro Abs: 8.4 10*3/uL — ABNORMAL HIGH (ref 1.7–7.7)
Neutrophils Relative %: 80 %
Platelets: 256 10*3/uL (ref 150–400)
RBC: 4.72 MIL/uL (ref 3.87–5.11)
RDW: 15.3 % (ref 11.5–15.5)
WBC: 10.5 10*3/uL (ref 4.0–10.5)
nRBC: 0 % (ref 0.0–0.2)

## 2023-08-13 MED ORDER — MORPHINE SULFATE (CONCENTRATE) 10 MG /0.5 ML PO SOLN
5.0000 mg | ORAL | Status: DC | PRN
  Administered 2023-08-14: 5 mg via SUBLINGUAL
  Filled 2023-08-13: qty 0.5

## 2023-08-13 NOTE — TOC Progression Note (Signed)
 Transition of Care [facility]) - Progression Note    Patient Details  Name: [name] MRN: [id number] Date of Birth: [birth year]  Transition of Care [facility]) CM/SW Contact  Jonathan Neighbor, RN Phone Number: 08/13/2023, 2:13 PM  Clinical Narrative:     Family has decided on home with hospice. CM has provided the daughter with choices for hospice agency. She is to talk with her spouse today about agency and DME needs.  She will update CM later today vs am.  TOC following.  Expected Discharge Plan: Home w Hospice Care Barriers to Discharge: Continued Medical Work up  Expected Discharge Plan and Services   Discharge Planning Services: CM Consult   Living arrangements for the past 2 months: Single Family Home                                       Social Determinants of Health (SDOH) Interventions SDOH Screenings   Food Insecurity: No Food Insecurity (08/07/2023)  Housing: Low Risk  (08/07/2023)  Transportation Needs: No Transportation Needs (08/07/2023)  Utilities: At Risk (08/07/2023)  Financial Resource Strain: Low Risk  (08/01/2023)   Received from [facility]  Physical Activity: Unknown (08/01/2023)   Received from [facility]  Social Connections: Moderately Isolated (08/07/2023)  Stress: No Stress Concern Present (08/01/2023)   Received from [facility]  Tobacco Use: Low Risk  (08/07/2023)    Readmission Risk Interventions    08/04/2023   12:33 PM  Readmission Risk Prevention Plan  Post Dischage Appt Complete  Medication Screening Complete  Transportation Screening Complete

## 2023-08-13 NOTE — Plan of Care (Signed)
 Talked with Palliative and status changed to DNR. Not taking by mouth meds or foods. IV pulled out and not replaced.    Problem: Education: Goal: Knowledge of General Education information will improve Description: Including pain rating scale, medication(s)/side effects and non-pharmacologic comfort measures Outcome: Progressing   Problem: Nutrition: Goal: Adequate nutrition will be maintained Outcome: Progressing   Problem: Coping: Goal: Level of anxiety will decrease Outcome: Progressing   Problem: Safety: Goal: Ability to remain free from injury will improve Outcome: Progressing   Problem: Skin Integrity: Goal: Risk for impaired skin integrity will decrease Outcome: Progressing

## 2023-08-13 NOTE — Assessment & Plan Note (Addendum)
 Remains sleepy today. VBG and BG yesterday unremarkable. Per patient's home aid, patient has been having memory difficulties over past 6-7 months.  - Delirium precautions

## 2023-08-13 NOTE — Assessment & Plan Note (Addendum)
 Refeeding labs unremarkable. Not taking much by mouth.  -Continue dysphagia diet -IVF D5NS 75 ml/hr
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
## 2023-08-13 NOTE — Progress Notes (Signed)
 Nutrition Follow-up  DOCUMENTATION CODES:  Not applicable  INTERVENTION:  Continue to encourage PO intake as pt desires Continue Ensure BID and Magic cup as tolerated Monitor for GOC with Palliative Care and whether PEG tube is indicated If TF indicated, recommend: Osmolite 1.2 at 55ml/hr ( per day) *start at 33ml/hr and advance by 10ml q10h to goal rate Provides 1440 kcal, 67g protein and free water daily High refeeding risk- if nutrition support initiated, monitor Potassium, Magnesium  and Phosphorus daily x4 occurrences; MD to replete as necessary Request updated measured bed weight  NUTRITION DIAGNOSIS:  Inadequate oral intake related to inability to eat as evidenced by NPO status. 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 Found to have aspiration pneumonia.  Recent admission 4/11-4/14  4/18: bronchoscopy 4/19: MBS- dysphagia 1, thin liquids  Noted PMT consulted yesterday as pt has been refusing to take meds, food or snacks by mouth. Pt continues with AMS.  Checked in with patient in room. Private nurse aid and daughter present at beside. Pt's daughter reports that at home she was primarily reliant on soft/easy to chew foods but was doing well up until recent admission. Quantity and frequency of typical PO intake not reported. She reports that pt was eating and talking up until Monday. Since then she has been biting down, not opening her mouth and not taking any food or medications despite constant efforts to encourage intake.  At baseline pt is wheelchair dependent.   Pt's daughter  awaiting visit with palliative care as she would like to discuss possibility of PEG tube placement. She mentions that she had asked pt to blink once if she would like a PEG tube for nutrition support to which she recalls she was able to do.   Limited weight history on file to review over the last year. Last documented weight was 68 kg on 4/18 however it is unclear whether this was actual versus stated.   Patient's muscle deficits are likely related to immobility status in setting of MS and quadriplegic status; however suspect there is likely a degree of malnutrition present given observed subcutaneous fat deficits though unable to diagnose at this time. Will continue to monitor as pt is at high nutrition risk given refusal of PO intake and already present subcutaneous fat depletions.   Medications: colace BID, pepcid , MVI, nystatin  QID, miralax  daily, IV D5 @ 74ml/hr  Labs:  Cr 0.41 CBG's 152 (4/22)  NUTRITION FOCUSED PHYSICAL FINDINGS: Flowsheet Row Most Recent Value  Orbital Region Mild depletion  Upper Arm Region Severe depletion  Thoracic and Lumbar Region Unable to assess  Buccal Region Moderate depletion  Temple Region Mild depletion  Clavicle Bone Region Severe depletion  Clavicle and Acromion Bone Region Severe depletion  Scapular Bone Region Severe depletion  Dorsal Hand Unable to assess  Patellar Region Severe depletion  Anterior Thigh Region Severe depletion  Posterior Calf Region Severe depletion  Edema (RD Assessment) Mild  [non-pitting BLE]  Hair Reviewed  Eyes Reviewed  Mouth Unable to assess  Skin Reviewed  Nails Reviewed   Diet Order:   Diet Order  DIET - DYS 1 Room service appropriate? Yes; Fluid consistency: Thin  Diet effective now                   EDUCATION NEEDS:  Not appropriate for education at this time  Skin:  Skin Assessment: Skin Integrity Issues: Skin Integrity Issues:: Stage I, DTI, Other (Comment) DTI: R foot Stage I: L  elbow Other: IAD mid-coccyx  Last BM:  4/22 type 4 large  Height:  Ht Readings from Last 1 Encounters:  08/08/23 5\' 4"  (1.626 m)    Weight:  Wt Readings from Last 1 Encounters:  08/08/23 68 kg    Ideal Body Weight:  54.5 kg  BMI:  Body mass index is 25.73 kg/m.  Estimated Nutritional Needs:   Kcal:  1400-1600 kcal/d  Protein:  65-80 g/d  Fluid:  1.5L/d  Allie Venetia Prewitt, RDN, LDN Clinical Nutrition See AMiON for contact information.

## 2023-08-13 NOTE — Plan of Care (Signed)

## 2023-08-13 NOTE — Assessment & Plan Note (Addendum)
 Completed abx treatment for prior UTI.  Suprapubic catheter exchanged on 4/22. Repeat urine culture with yeast again.  - Connect with ID regarding treatment

## 2023-08-13 NOTE — Progress Notes (Signed)
 IVT consult placed for new peripheral placement. Per IVT assessment on 4/22 patient has extremely limited options given her contractures and IR placed CVC was recommended if additional access is required. [place] sent to primary team to notify of needs. Informed them that at the present time she has no peripheral access. Dr. Fernand Howard and family medicine team to assess during rounds.   Tavari Loadholt R Kinzee Happel, RN

## 2023-08-13 NOTE — Assessment & Plan Note (Addendum)
 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 Holding home tramadol , trazodone , gabapentin  due to AMS Neurogenic bladder: Suprapubic catheter. Hold home Vesicare and Mirabegron , cannot be crushed, aspiration risk Hypothyroidism: Home synthroid  25 daily.  SVT s/p ablation, complete heart block s/p pacemaker MS: Not on any DMTs Constipation: Home miralax  daily, docusate Mood: Home cymbalta  60 daily  Allergies: Home singulair  10 daily, xyzal (zyrtec ) 5 daily, flonase  daily prn  Sacral pressure injury - Wound care already consulted, appreciate recs

## 2023-08-13 NOTE — Assessment & Plan Note (Addendum)
 Stable respiratory status on 2L O2.  Stable s/p bronchoscopy with PCCM. BAL culture with yeast and PMN.  - Appreciate ongoing PCCM recs - BAL yeast may be colonization  - Completed Zosyn  x 7 d course  - Dysphagia 1 diet with meds crushed in puree per SLP s/p MBS - Palliative consulted - pending GOC discussion

## 2023-08-13 NOTE — Progress Notes (Addendum)
 Daily Progress Note Intern Pager: [phone]  Patient name: [name] Medical record number: [id number] Date of birth: 1945-07-30 Age: 78 y.o. Gender: female  Primary Care Provider: Gerrianne Krauss, PA-C Consultants: PCCM, Palliative Code Status: Full  Pt Overview and Major Events to Date:  4/16: Admitted 4/18: Bronchoscopy  Assessment and Plan:  [name] 78 y.o. admitted for metabolic/infectious encephalopathy secondary to likely aspiration pneumonia s/p bronchoscopy and MBS.  Completed Zosyn  course for pneumonia.  Completed UTI treatment and suprapubic catheter exchange.  Pertinent PMH includes MS, spastic quadriplegia, complete heart block s/p pacemaker. Pending Palliative meeting.  Assessment & Plan Aspiration pneumonia of left lung (HCC)  Bronchial obstruction Stable respiratory status on 2L O2.  Stable s/p bronchoscopy with PCCM. BAL culture with yeast and PMN.  - Appreciate ongoing PCCM recs - BAL yeast may be colonization  - Completed Zosyn  x 7 d course  - Dysphagia 1 diet with meds crushed in puree per SLP s/p MBS - Palliative consulted - pending GOC discussion Positive urine culture Completed abx treatment for prior UTI.  Suprapubic catheter exchanged on 4/22. Repeat urine culture with yeast again.  - Connect with ID regarding treatment  Protein malnutrition (HCC) Refeeding labs unremarkable. Not taking much by mouth.  -Continue dysphagia diet -IVF D5NS 75 ml/hr Altered mental status Remains sleepy today. VBG and BG yesterday unremarkable. Per patient's home aid, patient has been having memory difficulties over past 6-7 months.  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 Holding home tramadol , trazodone , gabapentin  due to AMS Neurogenic bladder: Suprapubic catheter. Hold home Vesicare and Mirabegron , cannot be crushed, aspiration risk Hypothyroidism: Home synthroid  25 daily.  SVT s/p ablation, complete  heart block s/p pacemaker MS: Not on any DMTs Constipation: Home miralax  daily, docusate Mood: Home cymbalta  60 daily  Allergies: Home singulair  10 daily, xyzal (zyrtec ) 5 daily, flonase  daily prn  Sacral pressure injury: Wound care already consulted, appreciate recs   FEN/GI: Dysphagia 1 PPx: Lovenox  Dispo: Home pending clinical improvement   Subjective:  Per aid, patient sleepy today and not accepting anything by mouth.   Objective: Temp:  [97.4 F (36.3 C)-97.8 F (36.6 C)] 97.5 F (36.4 C) (04/23 0337) Pulse Rate:  [68-71] 70 (04/23 0337) Resp:  [12-16] 14 (04/22 1541) BP: (138-162)/(90-109) 146/92 (04/23 0337) SpO2:  [95 %-100 %] 97 % (04/23 0337) Physical Exam: General: No acute distress.  CV: Normal S1/S2. No extra heart sounds. Warm and well-perfused. Pulm: Breathing comfortably on 2L O2. CTAB anteriorly. No increased WOB. Abd: Soft, non-tender, non-distended. Neuro: Sleepy, easily awoken. Nods to asking about her name. Does not verbalize any response. PEERL.  Laboratory: Most recent CBC Lab Results  Component Value Date   WBC 11.2 (H) 08/12/2023   HGB 15.2 (H) 08/12/2023   HCT 44.7 08/12/2023   MCV 91.2 08/12/2023   PLT 243 08/12/2023   Most recent BMP    Latest Ref Rng & Units 08/12/2023    5:44 AM  BMP  Glucose 70 - 99 mg/dL 696   BUN 8 - 23 mg/dL 11   Creatinine 2.95 - 1.00 mg/dL 2.84   Sodium 132 - 440 mmol/L 139   Potassium 3.5 - 5.1 mmol/L 4.6   Chloride 98 - 111 mmol/L 106   CO2 22 - 32 mmol/L 21   Calcium 8.9 - 10.3 mg/dL 8.9     Carey Chapman, MD 08/13/2023, 7:26 AM  PGY-1, [facility] Family Medicine FPTS Intern pager: [phone],  text pages welcome Secure chat group [facility] Teaching Service

## 2023-08-14 ENCOUNTER — Telehealth (HOSPITAL_COMMUNITY): Payer: Self-pay | Admitting: Pharmacy Technician

## 2023-08-14 ENCOUNTER — Other Ambulatory Visit (HOSPITAL_COMMUNITY): Payer: Self-pay

## 2023-08-14 DIAGNOSIS — J690 Pneumonitis due to inhalation of food and vomit: Secondary | ICD-10-CM | POA: Diagnosis not present

## 2023-08-14 MED ORDER — FENTANYL 12 MCG/HR TD PT72: 1.0000 | MEDICATED_PATCH | TRANSDERMAL | Status: DC

## 2023-08-14 MED ORDER — MORPHINE SULFATE (CONCENTRATE) 20 MG/ML PO SOLN
5.0000 mg | ORAL | 0 refills | Status: DC | PRN
  Filled 2023-08-14: qty 30, 20d supply, fill #0

## 2023-08-14 MED ORDER — ONDANSETRON 4 MG PO TBDP
4.0000 mg | ORAL_TABLET | Freq: Three times a day (TID) | ORAL | 0 refills | Status: AC | PRN
  Filled 2023-08-14: qty 20, 7d supply, fill #0

## 2023-08-14 MED ORDER — ENSURE ENLIVE PO LIQD: 237.0000 mL | Freq: Two times a day (BID) | ORAL | Status: AC

## 2023-08-14 MED ORDER — MORPHINE SULFATE (CONCENTRATE) 20 MG/ML PO SOLN
5.0000 mg | ORAL | 0 refills | Status: AC | PRN
  Filled 2023-08-14: qty 30, 20d supply, fill #0
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
## 2023-08-14 NOTE — Progress Notes (Signed)
 Daily Progress Note Intern Pager: [phone]  Patient name: [name] Medical record number: [id number] Date of birth: 1945-07-08 Age: 78 y.o. Gender: female  Primary Care Provider: Gerrianne Krauss, PA-C Consultants: PCCM, Palliative  Code Status: DNR/DNI - Limited   Pt Overview and Major Events to Date:  4/16: Admitted  4/18: Bronchoscopy  4/22: Suprapubic catheter exchanged  4/23: Change to DNR/DNI status   Assessment and Plan:  [name] 78 y.o. with hx of MS, spastic quadriplegia admitted for metabolic/infectious encephalopathy secondary to likely aspiration pneumonia s/p bronchoscopy and MBS. Has completed abx courses for pneumonia and UTI. Per palliative discussion, family proceeding with home hospice for patient.  Assessment & Plan Aspiration pneumonia of left lung (HCC)  Bronchial obstruction Stable respiratory status on 2L O2 s/p bronchoscopy with PCCM. BAL culture with yeast and PMN though to be colonization per ID.  - Completed Zosyn  x 7 d course  - Dysphagia 1 diet with meds crushed in puree per SLP s/p MBS - Per palliative discussion, family pursuing home hospice for patient  - Appreciate Palliative recs regarding pain regimen  Altered mental status Gradually declining mental status. Prior head imaging, VBG, and BG unremarkable.  - Appreciate palliative involvement per above  - Delirium precautions  Protein malnutrition (HCC) Refeeding labs unremarkable during admission. Little to no PO intake.  - Dysphagia diet as above  Chronic health problem Spastic quadriplegia: Home baclofen  20 TID. Holding home tramadol , trazodone , gabapentin  due to AMS. Neurogenic bladder: Suprapubic catheter exchanged 4/22. Hold home Vesicare and Mirabegron , cannot be crushed, aspiration risk Hypothyroidism: Home synthroid  25 daily.  SVT s/p ablation, complete heart block s/p pacemaker MS: Not on any DMTs Constipation: Home miralax  daily, docusate Mood: Home cymbalta  60  daily  Allergies: Home singulair  10 daily, xyzal (zyrtec ) 5 daily, flonase  daily prn  Sacral pressure injury: Wound care already consulted, appreciate recs    FEN/GI: Dysphagia diet 1 PPx: Lovenox  Dispo: Home hospice pending palliative recs regarding pain regimen   Subjective:  Per patient aid, patient continues to wax and wane, not taking much by mouth. Concerned patient in pain.   Objective: Temp:  [98.2 F (36.8 C)-99.5 F (37.5 C)] 98.2 F (36.8 C) (04/24 0802) Pulse Rate:  [68-71] 70 (04/24 0802) Resp:  [14-18] 17 (04/24 0802) BP: (136-154)/(80-93) 150/93 (04/24 0802) SpO2:  [90 %-95 %] 92 % (04/24 0802) Physical Exam: General: No acute distress.  CV: Normal S1/S2. No extra heart sounds.  Pulm: Breathing comfortably on 2L O2. No increased WOB. Abd: Soft, non-tender, non-distended. Neuro: Sleepy, easily alerted. PEERL. Unable to follow commands or verbalize responses. Nods to her name.   Laboratory: Most recent CBC Lab Results  Component Value Date   WBC 10.5 08/13/2023   HGB 14.3 08/13/2023   HCT 43.0 08/13/2023   MCV 91.1 08/13/2023   PLT 256 08/13/2023   Most recent BMP    Latest Ref Rng & Units 08/12/2023    5:44 AM  BMP  Glucose 70 - 99 mg/dL 147   BUN 8 - 23 mg/dL 11   Creatinine 8.29 - 1.00 mg/dL 5.62   Sodium 130 - 865 mmol/L 139   Potassium 3.5 - 5.1 mmol/L 4.6   Chloride 98 - 111 mmol/L 106   CO2 22 - 32 mmol/L 21   Calcium 8.9 - 10.3 mg/dL 8.9    Carey Chapman, MD 08/14/2023, 8:26 AM  PGY-1, [place] Family Medicine FPTS Intern pager: [phone], text pages welcome Secure chat group [facility]  [facility] Teaching Service

## 2023-08-14 NOTE — Telephone Encounter (Signed)
 Pharmacy Patient Advocate Encounter   Received notification from Inpatient Request that prior authorization for Ondansetron  4MG  dispersible tablets is required/requested.   Insurance verification completed.   The patient is insured through [facility] ADV[employer] .   Per test claim: PA required; PA submitted to above mentioned insurance via CoverMyMeds Key/confirmation #/EOC [employer] Status is pending

## 2023-08-14 NOTE — Plan of Care (Signed)

## 2023-08-14 NOTE — Assessment & Plan Note (Addendum)
 Gradually declining mental status. Prior head imaging, VBG, and BG unremarkable.  - Appreciate palliative involvement per above  - Delirium precautions

## 2023-08-14 NOTE — Telephone Encounter (Signed)
 Pharmacy Patient Advocate Encounter  Received notification from [facility] [employer] that Prior Authorization for Ondansetron 4MG  dispersible tablets  has been DENIED.  Full denial letter will be uploaded to the media tab. See denial reason below.

## 2023-08-14 NOTE — Progress Notes (Signed)
 SLP Cancellation Note  Patient Details Name: [name] MRN: [id number] DOB: 08/28/1945   Cancelled treatment:       Reason Eval/Treat Not Completed: Other (comment) Patient is discharging home with hospice services. SLP will respectfully s/o at this time.  Jacqualine Mater, MA, CCC-SLP Speech Therapy

## 2023-08-14 NOTE — Assessment & Plan Note (Addendum)
 Stable respiratory status on 2L O2 s/p bronchoscopy with PCCM. BAL culture with yeast and PMN though to be colonization per ID.  - Completed Zosyn  x 7 d course  - Dysphagia 1 diet with meds crushed in puree per SLP s/p MBS - Per palliative discussion, family pursuing home hospice for patient  - Appreciate Palliative recs regarding pain regimen

## 2023-08-14 NOTE — Assessment & Plan Note (Addendum)
 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 Holding home tramadol , trazodone , gabapentin  due to AMS. Neurogenic bladder: Suprapubic catheter exchanged 4/22. Hold home Vesicare and Mirabegron , cannot be crushed, aspiration risk Hypothyroidism: Home synthroid  25 daily.  SVT s/p ablation, complete heart block s/p pacemaker MS: Not on any DMTs Constipation: Home miralax  daily, docusate Mood: Home cymbalta  60 daily  Allergies: Home singulair  10 daily, xyzal (zyrtec ) 5 daily, flonase  daily prn  Sacral pressure injury: Wound care already consulted, appreciate recs

## 2023-08-14 NOTE — Progress Notes (Signed)
 [place] [facility] Liaison Note  Received request from Kelli, Transitions of Care Manager, for hospice services at home after discharge. Spoke with daughter to initiate education related to hospice philosophy, services, and team approach to care. Patient/family verbalized understanding of information given. Per discussion, the plan is for discharge home by PTAR/EMS today if possible.   DME needs discussed. Patient has a hospital bed, hoyer lift oxygen, and wheelchair in the home. Patient/family requests no other DME at this time.  Please send signed and completed DNR home with patient/family. Please provide prescriptions at discharge as needed to ensure ongoing symptom management.   [facility] information and contact numbers given to [place], daughter. Above information shared with Alejandra Amos, Transitions of Care Manager. Please call with any questions or concerns.   Thank you for the opportunity to participate in this patient's care.   Madelene Schanz BSN, Charity fundraiser, OCN [employer] [phone]

## 2023-08-14 NOTE — Assessment & Plan Note (Addendum)
 Refeeding labs unremarkable during admission. Little to no PO intake.  - Dysphagia diet as above

## 2023-08-14 NOTE — TOC Transition Note (Signed)
 Transition of Care [facility]) - Discharge Note   Patient Details  Name: [name] MRN: [id number] Date of Birth: 02/01/1946  Transition of Care [facility] CM/SW Contact:  Jonathan Neighbor, RN Phone Number: 08/14/2023, 1:26 PM   Clinical Narrative:     Pt is discharging home with [facility] hospice services. Daughter provided choice and chose [facility]. [facility] to see the pt at home tomorrow.  Pt is transporting home via PTAR. Address verified with daughter.  Pt has all needed DME at home.  Final next level of care: Home w Hospice Care Barriers to Discharge: No Barriers Identified   Patient Goals and CMS Choice   CMS Medicare.gov Compare Post Acute Care list provided to:: Patient Represenative (must comment) Choice offered to / list presented to : Adult Children      Discharge Placement                       Discharge Plan and Services Additional resources added to the After Visit Summary for     Discharge Planning Services: CM Consult                        [facility] Agency: [facility] [place] Date [facility] Agency Contacted: 08/14/23   Representative spoke with at [facility] Agency: Shawn  Social Drivers of Health (SDOH) Interventions SDOH Screenings   Food Insecurity: No Food Insecurity (08/07/2023)  Housing: Low Risk  (08/07/2023)  Transportation Needs: No Transportation Needs (08/07/2023)  Utilities: At Risk (08/07/2023)  Financial Resource Strain: Low Risk  (08/01/2023)   Received from [facility]  Physical Activity: Unknown (08/01/2023)   Received from [facility]  Social Connections: Moderately Isolated (08/07/2023)  Stress: No Stress Concern Present (08/01/2023)   Received from [facility]  Tobacco Use: Low Risk  (08/07/2023)     Readmission Risk Interventions    08/04/2023   12:33 PM  Readmission Risk Prevention Plan  Post Dischage Appt Complete  Medication Screening Complete  Transportation Screening Complete

## 2023-08-14 NOTE — Discharge Summary (Signed)
 Family Medicine Teaching [facility] Discharge Summary  Patient name: [name] Medical record number: [id number] Date of birth: 03/04/1946 Age: 78 y.o. Gender: female Date of Admission: 08/06/2023  Date of Discharge: 08/14/23  Admitting Physician: Demetra Filter McDiarmid, MD  Primary Care Provider: Gerrianne Krauss, PA-C Consultants: PCCM, Palliative   Indication for Hospitalization: AMS  Discharge Diagnoses/Problem List:  Principal Problem for Admission: AMS, Aspiration PNA Other Problems addressed during stay:  Principal Problem:   Aspiration pneumonia of left lung (HCC)  Bronchial obstruction Active Problems:   Altered mental status   Protein malnutrition (HCC)   Metabolic encephalopathy   Inadequate oral intake   Dysphagia, pharyngeal phase   Frailty syndrome in geriatric patient   Impaired orientation   Pressure injury of skin of sacral region   Chronic health problem   Brief Hospital Course:  [name] is a 78 y.o.female with a history of spastic quadriplegia, multiple sclerosis, neurogenic bladder with suprapubic catheter, complete heart block status post pacemaker who was admitted to the family medicine teaching Service at [facility] for altered mental status. Her hospital course is detailed below:  AMS Patient arrived listless and not responsive in ED. Of note, patient was recently discharged after altered mental status suspected secondary to UTI.  No electrolyte abnormality or hypercapnia on arrival. Unremarkable CT head with unchanged EKG.  CT chest obtained which showed opacified left mainstem bronchus and subtotal left lung collapse concerning for aspiration pneumonia versus mucous plugging.  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 Pulmonology was consulted who recommended flex bronchoscopy with therapeutic suctioning.  Lavage sample grew candida, likely colonization per Pulm and ID. Patient  completed Zosyn  course for suspected aspiration pneumonia while admitted. SLP also evaluated patient, performed MBS, and recommended dysphagia 1 diet with crushed meds.   Patient's mental status waxed and waned throughout admission, suspect component of delirium and FTT. Family requested to see Palliative care to discuss GOC. Patient became DNR/DNI status. Patient discharged home with home hospice and pain regimen to optimize comfort.   UTI Urine culture obtained during this admission positive for leuks, white blood cells.  Patient was recently admitted for Citrobacter UTI, sensitive to Zosyn .  Patient was treated with Zosyn  for both UTI and pneumonia. Suprapubic catheter exchanged on 4/22. Urine culture with yeast, thought to be colonization per ID.   Other chronic conditions were medically managed with home medications and formulary alternatives as necessary (Neurogenic bladder, hypothyroidism, MS)  PCP Follow-up Recommendations: Ensure optimization of comfort     Disposition: Home hospice  Discharge Condition: Stable for home hospice  Discharge Exam:  Vitals:   08/14/23 0855 08/14/23 1139  BP:  (!) 167/99  Pulse:  70  Resp:  16  Temp:  98.6 F (37 C)  SpO2: 95% 92%   General: No acute distress.  CV: Normal S1/S2. No extra heart sounds.  Pulm: Breathing comfortably on 2L O2. No increased WOB. Abd: Soft, non-tender, non-distended. Neuro: Sleepy, easily alerted. PEERL. Unable to follow commands or verbalize responses. Nods to her name.   Significant Procedures:  4/18: Bronchoscopy  4/19: MBS 4/22: Suprapubic catheter exchanged   Significant Labs and Imaging:  Recent Labs  Lab 08/12/23 1907 08/13/23 0925  WBC 11.2* 10.5  HGB 15.2* 14.3  HCT 44.7 43.0  PLT 243 256      Latest Ref Rng & Units 08/12/2023    5:44 AM 08/11/2023    6:10 AM 08/10/2023  7:36 AM  CMP  Glucose 70 - 99 mg/dL 295  621  91   BUN 8 - 23 mg/dL 11  7  10    Creatinine 0.44 - 1.00 mg/dL 3.08   6.57  8.46   Sodium 135 - 145 mmol/L 139  137  137   Potassium 3.5 - 5.1 mmol/L 4.6  3.6  3.0   Chloride 98 - 111 mmol/L 106  99  99   CO2 22 - 32 mmol/L 21  25  24    Calcium 8.9 - 10.3 mg/dL 8.9  8.7  8.5    CT Head 4/16: No acute findings. Age related volume loss.  CT Chest 4/17:  Retained or Aspirated secretions in the trachea with opacified left mainstem bronchus and subtotal left lung collapse/consolidation. Consider mucous plug versus aspiration pneumonia. Possible small layering right pleural effusion.   Results/Tests Pending at Time of Discharge:  Unresulted Labs (From admission, onward)     Start     Ordered   08/08/23 1245  Acid Fast Culture with reflexed sensitivities  RELEASE UPON ORDERING,   TIMED       Comments: Specimen A: Specimen Description Left main stem bronchial washing Phone [phone]         Previous Biopsy:  no Is the patient on airborne/droplet precautions? No           Clinical History:  N/A Copy of Report to:  N/A Specimen Disposition: Microbiology     08/08/23 1245             Discharge Medications:  Allergies as of 08/14/2023   No Known Allergies      Medication List     STOP taking these medications    baclofen  20 MG tablet Commonly known as: LIORESAL    bisacodyl 10 MG suppository Commonly known as: DULCOLAX   cyanocobalamin 1000 MCG/ML injection Commonly known as: VITAMIN B12   DULoxetine  60 MG capsule Commonly known as: CYMBALTA    gabapentin  600 MG tablet Commonly known as: NEURONTIN    levocetirizine 5 MG tablet Commonly known as: XYZAL   levothyroxine  25 MCG tablet Commonly known as: SYNTHROID    megestrol 625 MG/5ML suspension Commonly known as: MEGACE ES   montelukast  10 MG tablet Commonly known as: SINGULAIR    Multivitamin Women 50+ Tabs   Myrbetriq  50 MG Tb24 tablet Generic drug: mirabegron  ER   nitrofurantoin  50 MG capsule Commonly known as: MACRODANTIN    polyethylene glycol 17 g packet Commonly known  as: MIRALAX  / GLYCOLAX    solifenacin 5 MG tablet Commonly known as: VESICARE   traMADol  50 MG tablet Commonly known as: ULTRAM    traZODone  50 MG tablet Commonly known as: DESYREL    VITAMIN C PO   VITAMIN D-3 PO       TAKE these medications    ALLEVYN SACRUM EX Apply 1 application  topically daily as needed (thinning skin/skin breakdown on sacrum).   collagenase 250 UNIT/GM ointment Commonly known as: SANTYL Apply 1 Application topically daily as needed (wound healing).   feeding supplement Liqd Take 237 mLs by mouth 2 (two) times daily between meals.   fluticasone  50 MCG/ACT nasal spray Commonly known as: FLONASE  Place 1 spray into both nostrils daily as needed for allergies.   morphine  CONCENTRATE 10 mg / 0.5 ml concentrated solution Place 0.25 mLs (5 mg total) under the tongue every 4 (four) hours as needed for severe pain (pain score 7-10), moderate pain (pain score 4-6) or shortness of breath.   ondansetron  4 MG disintegrating  tablet Commonly known as: ZOFRAN -ODT Take 1 tablet (4 mg total) by mouth every 8 (eight) hours as needed for nausea or vomiting.   OXYGEN Inhale 1 L/min into the lungs at bedtime.   Voltaren  1 % Gel Generic drug: diclofenac  Sodium Apply 2 g topically 4 (four) times daily as needed (pain).        Discharge Instructions: Please refer to Patient Instructions section of EMR for full details.  Patient was counseled important signs and symptoms that should prompt return to medical care, changes in medications, dietary instructions, activity restrictions, and follow up appointments.   Follow-Up Appointments: No future appointments.   Carey Chapman, MD 08/14/2023, 1:03 PM PGY-1, [facility] Family Medicine

## 2023-08-14 NOTE — Discharge Instructions (Signed)
 Dear [name] and family,   Thank you so much for allowing us  to be part of your care. [name] was admitted to [facility] for changes in her mental status. She underwent a procedure with the Lung specialists and received antibiotics to help treat her aspiration pneumonia. Her urinary catheter was exchanged on 08/12/23. The Palliative Care team met with you all and discussed goals of care including home hospice and medications to optimize her comfort. It was a privilege to meet you all!  Warm wishes,   Family Medicine Teaching Service  [place]  [facility]  [address] [place], [place] [address] [phone]

## 2023-09-10 LAB — FUNGAL ORGANISM REFLEX

## 2023-09-10 LAB — FUNGUS CULTURE WITH STAIN

## 2023-09-10 LAB — FUNGUS CULTURE RESULT

## 2023-09-21 LAB — ACID FAST CULTURE WITH REFLEXED SENSITIVITIES (MYCOBACTERIA): Acid Fast Culture: NEGATIVE
